# Patient Record
Sex: Female | Born: 1950
Health system: Southern US, Community
[De-identification: ages and names within clinical notes are randomized; demographics above are authoritative.]

## PROBLEM LIST (undated history)

## (undated) DIAGNOSIS — G43909 Migraine, unspecified, not intractable, without status migrainosus: Secondary | ICD-10-CM

## (undated) DIAGNOSIS — B019 Varicella without complication: Secondary | ICD-10-CM

## (undated) DIAGNOSIS — I480 Paroxysmal atrial fibrillation: Secondary | ICD-10-CM

## (undated) DIAGNOSIS — Z973 Presence of spectacles and contact lenses: Secondary | ICD-10-CM

## (undated) DIAGNOSIS — H3530 Unspecified macular degeneration: Secondary | ICD-10-CM

## (undated) DIAGNOSIS — B029 Zoster without complications: Secondary | ICD-10-CM

## (undated) DIAGNOSIS — I4891 Unspecified atrial fibrillation: Secondary | ICD-10-CM

## (undated) DIAGNOSIS — F418 Other specified anxiety disorders: Secondary | ICD-10-CM

## (undated) DIAGNOSIS — K439 Ventral hernia without obstruction or gangrene: Secondary | ICD-10-CM

## (undated) DIAGNOSIS — E785 Hyperlipidemia, unspecified: Secondary | ICD-10-CM

## (undated) DIAGNOSIS — M1990 Unspecified osteoarthritis, unspecified site: Secondary | ICD-10-CM

## (undated) DIAGNOSIS — F32A Depression, unspecified: Secondary | ICD-10-CM

## (undated) DIAGNOSIS — I82409 Acute embolism and thrombosis of unspecified deep veins of unspecified lower extremity: Secondary | ICD-10-CM

## (undated) DIAGNOSIS — R011 Cardiac murmur, unspecified: Secondary | ICD-10-CM

## (undated) DIAGNOSIS — I8390 Asymptomatic varicose veins of unspecified lower extremity: Secondary | ICD-10-CM

## (undated) DIAGNOSIS — B059 Measles without complication: Secondary | ICD-10-CM

## (undated) DIAGNOSIS — F329 Major depressive disorder, single episode, unspecified: Secondary | ICD-10-CM

## (undated) DIAGNOSIS — Z8632 Personal history of gestational diabetes: Secondary | ICD-10-CM

## (undated) DIAGNOSIS — M25461 Effusion, right knee: Secondary | ICD-10-CM

## (undated) DIAGNOSIS — K5792 Diverticulitis of intestine, part unspecified, without perforation or abscess without bleeding: Secondary | ICD-10-CM

## (undated) DIAGNOSIS — M4310 Spondylolisthesis, site unspecified: Secondary | ICD-10-CM

## (undated) DIAGNOSIS — E039 Hypothyroidism, unspecified: Secondary | ICD-10-CM

## (undated) DIAGNOSIS — B083 Erythema infectiosum [fifth disease]: Secondary | ICD-10-CM

## (undated) HISTORY — DX: Other specified anxiety disorders: F41.8

## (undated) HISTORY — DX: Zoster without complications: B02.9

## (undated) HISTORY — DX: Hyperlipidemia, unspecified: E78.5

## (undated) HISTORY — DX: Unspecified osteoarthritis, unspecified site: M19.90

## (undated) HISTORY — DX: Cardiac murmur, unspecified: R01.1

## (undated) HISTORY — DX: Unspecified atrial fibrillation: I48.91

## (undated) HISTORY — PX: MOUTH SURGERY: SHX715

## (undated) HISTORY — DX: Paroxysmal atrial fibrillation: I48.0

## (undated) HISTORY — DX: Personal history of gestational diabetes: Z86.32

## (undated) HISTORY — DX: Unspecified macular degeneration: H35.30

## (undated) HISTORY — PX: COLONOSCOPY: SHX174

## (undated) HISTORY — DX: Ventral hernia without obstruction or gangrene: K43.9

## (undated) HISTORY — PX: DILATION AND CURETTAGE OF UTERUS: SHX78

## (undated) HISTORY — DX: Varicella without complication: B01.9

## (undated) HISTORY — DX: Asymptomatic varicose veins of unspecified lower extremity: I83.90

## (undated) HISTORY — DX: Spondylolisthesis, site unspecified: M43.10

## (undated) HISTORY — DX: Measles without complication: B05.9

---

## 1987-04-09 DIAGNOSIS — B083 Erythema infectiosum [fifth disease]: Secondary | ICD-10-CM

## 1987-04-09 HISTORY — DX: Erythema infectiosum (fifth disease): B08.3

## 1993-04-08 HISTORY — PX: VARICOSE VEIN SURGERY: SHX832

## 2006-04-08 DIAGNOSIS — I4891 Unspecified atrial fibrillation: Secondary | ICD-10-CM

## 2006-04-08 HISTORY — DX: Unspecified atrial fibrillation: I48.91

## 2008-04-08 LAB — HM COLONOSCOPY

## 2010-04-08 DIAGNOSIS — M4310 Spondylolisthesis, site unspecified: Secondary | ICD-10-CM

## 2010-04-08 HISTORY — DX: Spondylolisthesis, site unspecified: M43.10

## 2011-04-09 LAB — HM PAP SMEAR

## 2012-08-04 ENCOUNTER — Ambulatory Visit (INDEPENDENT_AMBULATORY_CARE_PROVIDER_SITE_OTHER): Payer: 59 | Admitting: Family Medicine

## 2012-08-04 ENCOUNTER — Encounter: Payer: Self-pay | Admitting: Family Medicine

## 2012-08-04 VITALS — BP 118/72 | HR 49 | Temp 97.9°F | Ht 65.0 in

## 2012-08-04 DIAGNOSIS — B019 Varicella without complication: Secondary | ICD-10-CM | POA: Insufficient documentation

## 2012-08-04 DIAGNOSIS — I4891 Unspecified atrial fibrillation: Secondary | ICD-10-CM

## 2012-08-04 DIAGNOSIS — R011 Cardiac murmur, unspecified: Secondary | ICD-10-CM | POA: Insufficient documentation

## 2012-08-04 DIAGNOSIS — O24410 Gestational diabetes mellitus in pregnancy, diet controlled: Secondary | ICD-10-CM

## 2012-08-04 DIAGNOSIS — B029 Zoster without complications: Secondary | ICD-10-CM | POA: Insufficient documentation

## 2012-08-04 DIAGNOSIS — M1990 Unspecified osteoarthritis, unspecified site: Secondary | ICD-10-CM

## 2012-08-04 DIAGNOSIS — M4310 Spondylolisthesis, site unspecified: Secondary | ICD-10-CM | POA: Insufficient documentation

## 2012-08-04 DIAGNOSIS — Z0000 Encounter for general adult medical examination without abnormal findings: Secondary | ICD-10-CM

## 2012-08-04 DIAGNOSIS — K439 Ventral hernia without obstruction or gangrene: Secondary | ICD-10-CM

## 2012-08-04 DIAGNOSIS — K5900 Constipation, unspecified: Secondary | ICD-10-CM

## 2012-08-04 DIAGNOSIS — E785 Hyperlipidemia, unspecified: Secondary | ICD-10-CM

## 2012-08-04 DIAGNOSIS — I480 Paroxysmal atrial fibrillation: Secondary | ICD-10-CM

## 2012-08-04 DIAGNOSIS — I8390 Asymptomatic varicose veins of unspecified lower extremity: Secondary | ICD-10-CM

## 2012-08-04 HISTORY — DX: Ventral hernia without obstruction or gangrene: K43.9

## 2012-08-04 LAB — LIPID PANEL
Cholesterol: 206 mg/dL — ABNORMAL HIGH (ref 0–200)
HDL: 67 mg/dL (ref >39)
LDL Cholesterol: 128 mg/dL — ABNORMAL HIGH (ref 0–99)
Triglycerides: 54 mg/dL (ref <150)

## 2012-08-04 LAB — HEPATIC FUNCTION PANEL
AST: 27 U/L (ref 0–37)
Bilirubin, Direct: 0.2 mg/dL (ref 0.0–0.3)
Indirect Bilirubin: 0.8 mg/dL (ref 0.0–0.9)
Total Bilirubin: 1 mg/dL (ref 0.3–1.2)

## 2012-08-04 LAB — CBC
Hemoglobin: 13.2 g/dL (ref 12.0–15.0)
MCHC: 35.3 g/dL (ref 30.0–36.0)
RDW: 13.3 % (ref 11.5–15.5)
WBC: 5.6 10*3/uL (ref 4.0–10.5)

## 2012-08-04 LAB — RENAL FUNCTION PANEL
BUN: 13 mg/dL (ref 6–23)
CO2: 30 mEq/L (ref 19–32)
Glucose, Bld: 85 mg/dL (ref 70–99)
Potassium: 4.4 mEq/L (ref 3.5–5.3)
Sodium: 139 mEq/L (ref 135–145)

## 2012-08-04 NOTE — Patient Instructions (Addendum)
Rel of rec PMD, keneth cohen or down load off of disc   Preventive Care for Adults, Female A healthy lifestyle and preventive care can promote health and wellness. Preventive health guidelines for women include the following key practices.  A routine yearly physical is a good way to check with your caregiver about your health and preventive screening. It is a chance to share any concerns and updates on your health, and to receive a thorough exam.  Visit your dentist for a routine exam and preventive care every 6 months. Brush your teeth twice a day and floss once a day. Good oral hygiene prevents tooth decay and gum disease.  The frequency of eye exams is based on your age, health, family medical history, use of contact lenses, and other factors. Follow your caregiver's recommendations for frequency of eye exams.  Eat a healthy diet. Foods like vegetables, fruits, whole grains, low-fat dairy products, and lean protein foods contain the nutrients you need without too many calories. Decrease your intake of foods high in solid fats, added sugars, and salt. Eat the right amount of calories for you.Get information about a proper diet from your caregiver, if necessary.  Regular physical exercise is one of the most important things you can do for your health. Most adults should get at least 150 minutes of moderate-intensity exercise (any activity that increases your heart rate and causes you to sweat) each week. In addition, most adults need muscle-strengthening exercises on 2 or more days a week.  Maintain a healthy weight. The body mass index (BMI) is a screening tool to identify possible weight problems. It provides an estimate of body fat based on height and weight. Your caregiver can help determine your BMI, and can help you achieve or maintain a healthy weight.For adults 20 years and older:  A BMI below 18.5 is considered underweight.  A BMI of 18.5 to 24.9 is normal.  A BMI of 25 to 29.9 is  considered overweight.  A BMI of 30 and above is considered obese.  Maintain normal blood lipids and cholesterol levels by exercising and minimizing your intake of saturated fat. Eat a balanced diet with plenty of fruit and vegetables. Blood tests for lipids and cholesterol should begin at age 39 and be repeated every 5 years. If your lipid or cholesterol levels are high, you are over 50, or you are at high risk for heart disease, you may need your cholesterol levels checked more frequently.Ongoing high lipid and cholesterol levels should be treated with medicines if diet and exercise are not effective.  If you smoke, find out from your caregiver how to quit. If you do not use tobacco, do not start.  If you are pregnant, do not drink alcohol. If you are breastfeeding, be very cautious about drinking alcohol. If you are not pregnant and choose to drink alcohol, do not exceed 1 drink per day. One drink is considered to be 12 ounces (355 mL) of beer, 5 ounces (148 mL) of wine, or 1.5 ounces (44 mL) of liquor.  Avoid use of street drugs. Do not share needles with anyone. Ask for help if you need support or instructions about stopping the use of drugs.  High blood pressure causes heart disease and increases the risk of stroke. Your blood pressure should be checked at least every 1 to 2 years. Ongoing high blood pressure should be treated with medicines if weight loss and exercise are not effective.  If you are 55 to 62 years  old, ask your caregiver if you should take aspirin to prevent strokes.  Diabetes screening involves taking a blood sample to check your fasting blood sugar level. This should be done once every 3 years, after age 55, if you are within normal weight and without risk factors for diabetes. Testing should be considered at a younger age or be carried out more frequently if you are overweight and have at least 1 risk factor for diabetes.  Breast cancer screening is essential preventive  care for women. You should practice "breast self-awareness." This means understanding the normal appearance and feel of your breasts and may include breast self-examination. Any changes detected, no matter how small, should be reported to a caregiver. Women in their 2s and 30s should have a clinical breast exam (CBE) by a caregiver as part of a regular health exam every 1 to 3 years. After age 60, women should have a CBE every year. Starting at age 82, women should consider having a mammography (breast X-ray test) every year. Women who have a family history of breast cancer should talk to their caregiver about genetic screening. Women at a high risk of breast cancer should talk to their caregivers about having magnetic resonance imaging (MRI) and a mammography every year.  The Pap test is a screening test for cervical cancer. A Pap test can show cell changes on the cervix that might become cervical cancer if left untreated. A Pap test is a procedure in which cells are obtained and examined from the lower end of the uterus (cervix).  Women should have a Pap test starting at age 86.  Between ages 49 and 6, Pap tests should be repeated every 2 years.  Beginning at age 80, you should have a Pap test every 3 years as long as the past 3 Pap tests have been normal.  Some women have medical problems that increase the chance of getting cervical cancer. Talk to your caregiver about these problems. It is especially important to talk to your caregiver if a new problem develops soon after your last Pap test. In these cases, your caregiver may recommend more frequent screening and Pap tests.  The above recommendations are the same for women who have or have not gotten the vaccine for human papillomavirus (HPV).  If you had a hysterectomy for a problem that was not cancer or a condition that could lead to cancer, then you no longer need Pap tests. Even if you no longer need a Pap test, a regular exam is a good idea  to make sure no other problems are starting.  If you are between ages 74 and 38, and you have had normal Pap tests going back 10 years, you no longer need Pap tests. Even if you no longer need a Pap test, a regular exam is a good idea to make sure no other problems are starting.  If you have had past treatment for cervical cancer or a condition that could lead to cancer, you need Pap tests and screening for cancer for at least 20 years after your treatment.  If Pap tests have been discontinued, risk factors (such as a new sexual partner) need to be reassessed to determine if screening should be resumed.  The HPV test is an additional test that may be used for cervical cancer screening. The HPV test looks for the virus that can cause the cell changes on the cervix. The cells collected during the Pap test can be tested for HPV. The HPV  test could be used to screen women aged 74 years and older, and should be used in women of any age who have unclear Pap test results. After the age of 50, women should have HPV testing at the same frequency as a Pap test.  Colorectal cancer can be detected and often prevented. Most routine colorectal cancer screening begins at the age of 49 and continues through age 39. However, your caregiver may recommend screening at an earlier age if you have risk factors for colon cancer. On a yearly basis, your caregiver may provide home test kits to check for hidden blood in the stool. Use of a small camera at the end of a tube, to directly examine the colon (sigmoidoscopy or colonoscopy), can detect the earliest forms of colorectal cancer. Talk to your caregiver about this at age 72, when routine screening begins. Direct examination of the colon should be repeated every 5 to 10 years through age 44, unless early forms of pre-cancerous polyps or small growths are found.  Hepatitis C blood testing is recommended for all people born from [birth year] through 1965 and any individual with known  risks for hepatitis C.  Practice safe sex. Use condoms and avoid high-risk sexual practices to reduce the spread of sexually transmitted infections (STIs). STIs include gonorrhea, chlamydia, syphilis, trichomonas, herpes, HPV, and human immunodeficiency virus (HIV). Herpes, HIV, and HPV are viral illnesses that have no cure. They can result in disability, cancer, and death. Sexually active women aged 38 and younger should be checked for chlamydia. Older women with new or multiple partners should also be tested for chlamydia. Testing for other STIs is recommended if you are sexually active and at increased risk.  Osteoporosis is a disease in which the bones lose minerals and strength with aging. This can result in serious bone fractures. The risk of osteoporosis can be identified using a bone density scan. Women ages 73 and over and women at risk for fractures or osteoporosis should discuss screening with their caregivers. Ask your caregiver whether you should take a calcium supplement or vitamin D to reduce the rate of osteoporosis.  Menopause can be associated with physical symptoms and risks. Hormone replacement therapy is available to decrease symptoms and risks. You should talk to your caregiver about whether hormone replacement therapy is right for you.  Use sunscreen with sun protection factor (SPF) of 30 or more. Apply sunscreen liberally and repeatedly throughout the day. You should seek shade when your shadow is shorter than you. Protect yourself by wearing long sleeves, pants, a wide-brimmed hat, and sunglasses year round, whenever you are outdoors.  Once a month, do a whole body skin exam, using a mirror to look at the skin on your back. Notify your caregiver of new moles, moles that have irregular borders, moles that are larger than a pencil eraser, or moles that have changed in shape or color.  Stay current with required immunizations.  Influenza. You need a dose every fall (or winter).  The composition of the flu vaccine changes each year, so being vaccinated once is not enough.  Pneumococcal polysaccharide. You need 1 to 2 doses if you smoke cigarettes or if you have certain chronic medical conditions. You need 1 dose at age 23 (or older) if you have never been vaccinated.  Tetanus, diphtheria, pertussis (Tdap, Td). Get 1 dose of Tdap vaccine if you are younger than age 64, are over 49 and have contact with an infant, are a Research scientist (physical sciences), are pregnant, or  simply want to be protected from whooping cough. After that, you need a Td booster dose every 10 years. Consult your caregiver if you have not had at least 3 tetanus and diphtheria-containing shots sometime in your life or have a deep or dirty wound.  HPV. You need this vaccine if you are a woman age 10 or younger. The vaccine is given in 3 doses over 6 months.  Measles, mumps, rubella (MMR). You need at least 1 dose of MMR if you were born in 1957 or later. You may also need a second dose.  Meningococcal. If you are age 70 to 50 and a first-year college student living in a residence hall, or have one of several medical conditions, you need to get vaccinated against meningococcal disease. You may also need additional booster doses.  Zoster (shingles). If you are age 43 or older, you should get this vaccine.  Varicella (chickenpox). If you have never had chickenpox or you were vaccinated but received only 1 dose, talk to your caregiver to find out if you need this vaccine.  Hepatitis A. You need this vaccine if you have a specific risk factor for hepatitis A virus infection or you simply wish to be protected from this disease. The vaccine is usually given as 2 doses, 6 to 18 months apart.  Hepatitis B. You need this vaccine if you have a specific risk factor for hepatitis B virus infection or you simply wish to be protected from this disease. The vaccine is given in 3 doses, usually over 6 months. Preventive Services /  Frequency Ages 78 to 18  Blood pressure check.** / Every 1 to 2 years.  Lipid and cholesterol check.** / Every 5 years beginning at age 51.  Clinical breast exam.** / Every 3 years for women in their 64s and 30s.  Pap test.** / Every 2 years from ages 49 through 23. Every 3 years starting at age 20 through age 73 or 67 with a history of 3 consecutive normal Pap tests.  HPV screening.** / Every 3 years from ages 73 through ages 22 to 96 with a history of 3 consecutive normal Pap tests.  Hepatitis C blood test.** / For any individual with known risks for hepatitis C.  Skin self-exam. / Monthly.  Influenza immunization.** / Every year.  Pneumococcal polysaccharide immunization.** / 1 to 2 doses if you smoke cigarettes or if you have certain chronic medical conditions.  Tetanus, diphtheria, pertussis (Tdap, Td) immunization. / A one-time dose of Tdap vaccine. After that, you need a Td booster dose every 10 years.  HPV immunization. / 3 doses over 6 months, if you are 71 and younger.  Measles, mumps, rubella (MMR) immunization. / You need at least 1 dose of MMR if you were born in 1957 or later. You may also need a second dose.  Meningococcal immunization. / 1 dose if you are age 1 to 34 and a first-year college student living in a residence hall, or have one of several medical conditions, you need to get vaccinated against meningococcal disease. You may also need additional booster doses.  Varicella immunization.** / Consult your caregiver.  Hepatitis A immunization.** / Consult your caregiver. 2 doses, 6 to 18 months apart.  Hepatitis B immunization.** / Consult your caregiver. 3 doses usually over 6 months. Ages 40 to 69  Blood pressure check.** / Every 1 to 2 years.  Lipid and cholesterol check.** / Every 5 years beginning at age 65.  Clinical breast exam.** /  Every year after age 79.  Mammogram.** / Every year beginning at age 74 and continuing for as long as you are in  good health. Consult with your caregiver.  Pap test.** / Every 3 years starting at age 61 through age 9 or 37 with a history of 3 consecutive normal Pap tests.  HPV screening.** / Every 3 years from ages 55 through ages 68 to 33 with a history of 3 consecutive normal Pap tests.  Fecal occult blood test (FOBT) of stool. / Every year beginning at age 41 and continuing until age 37. You may not need to do this test if you get a colonoscopy every 10 years.  Flexible sigmoidoscopy or colonoscopy.** / Every 5 years for a flexible sigmoidoscopy or every 10 years for a colonoscopy beginning at age 39 and continuing until age 35.  Hepatitis C blood test.** / For all people born from [birth year] through 1965 and any individual with known risks for hepatitis C.  Skin self-exam. / Monthly.  Influenza immunization.** / Every year.  Pneumococcal polysaccharide immunization.** / 1 to 2 doses if you smoke cigarettes or if you have certain chronic medical conditions.  Tetanus, diphtheria, pertussis (Tdap, Td) immunization.** / A one-time dose of Tdap vaccine. After that, you need a Td booster dose every 10 years.  Measles, mumps, rubella (MMR) immunization. / You need at least 1 dose of MMR if you were born in 1957 or later. You may also need a second dose.  Varicella immunization.** / Consult your caregiver.  Meningococcal immunization.** / Consult your caregiver.  Hepatitis A immunization.** / Consult your caregiver. 2 doses, 6 to 18 months apart.  Hepatitis B immunization.** / Consult your caregiver. 3 doses, usually over 6 months. Ages 63 and over  Blood pressure check.** / Every 1 to 2 years.  Lipid and cholesterol check.** / Every 5 years beginning at age 12.  Clinical breast exam.** / Every year after age 4.  Mammogram.** / Every year beginning at age 18 and continuing for as long as you are in good health. Consult with your caregiver.  Pap test.** / Every 3 years starting at age 73 through  age 70 or 49 with a 3 consecutive normal Pap tests. Testing can be stopped between 65 and 70 with 3 consecutive normal Pap tests and no abnormal Pap or HPV tests in the past 10 years.  HPV screening.** / Every 3 years from ages 35 through ages 55 or 38 with a history of 3 consecutive normal Pap tests. Testing can be stopped between 65 and 70 with 3 consecutive normal Pap tests and no abnormal Pap or HPV tests in the past 10 years.  Fecal occult blood test (FOBT) of stool. / Every year beginning at age 50 and continuing until age 48. You may not need to do this test if you get a colonoscopy every 10 years.  Flexible sigmoidoscopy or colonoscopy.** / Every 5 years for a flexible sigmoidoscopy or every 10 years for a colonoscopy beginning at age 78 and continuing until age 34.  Hepatitis C blood test.** / For all people born from [birth year] through 1965 and any individual with known risks for hepatitis C.  Osteoporosis screening.** / A one-time screening for women ages 29 and over and women at risk for fractures or osteoporosis.  Skin self-exam. / Monthly.  Influenza immunization.** / Every year.  Pneumococcal polysaccharide immunization.** / 1 dose at age 28 (or older) if you have never been vaccinated.  Tetanus, diphtheria, pertussis (  Tdap, Td) immunization. / A one-time dose of Tdap vaccine if you are over 65 and have contact with an infant, are a Research scientist (physical sciences), or simply want to be protected from whooping cough. After that, you need a Td booster dose every 10 years.  Varicella immunization.** / Consult your caregiver.  Meningococcal immunization.** / Consult your caregiver.  Hepatitis A immunization.** / Consult your caregiver. 2 doses, 6 to 18 months apart.  Hepatitis B immunization.** / Check with your caregiver. 3 doses, usually over 6 months. ** Family history and personal history of risk and conditions may change your caregiver's recommendations. Document Released: 05/21/2001 Document  Revised: 06/17/2011 Document Reviewed: 08/20/2010 [facility] Patient Information 2013 [place], [place].

## 2012-08-05 ENCOUNTER — Encounter: Payer: Self-pay | Admitting: Family Medicine

## 2012-08-05 ENCOUNTER — Encounter: Payer: Self-pay | Admitting: *Deleted

## 2012-08-05 DIAGNOSIS — K5900 Constipation, unspecified: Secondary | ICD-10-CM | POA: Insufficient documentation

## 2012-08-05 DIAGNOSIS — Z8632 Personal history of gestational diabetes: Secondary | ICD-10-CM

## 2012-08-05 DIAGNOSIS — I8390 Asymptomatic varicose veins of unspecified lower extremity: Secondary | ICD-10-CM | POA: Insufficient documentation

## 2012-08-05 DIAGNOSIS — K439 Ventral hernia without obstruction or gangrene: Secondary | ICD-10-CM | POA: Insufficient documentation

## 2012-08-05 DIAGNOSIS — M1990 Unspecified osteoarthritis, unspecified site: Secondary | ICD-10-CM

## 2012-08-05 DIAGNOSIS — E782 Mixed hyperlipidemia: Secondary | ICD-10-CM | POA: Insufficient documentation

## 2012-08-05 DIAGNOSIS — E785 Hyperlipidemia, unspecified: Secondary | ICD-10-CM

## 2012-08-05 HISTORY — DX: Asymptomatic varicose veins of unspecified lower extremity: I83.90

## 2012-08-05 HISTORY — DX: Hyperlipidemia, unspecified: E78.5

## 2012-08-05 HISTORY — DX: Unspecified osteoarthritis, unspecified site: M19.90

## 2012-08-05 HISTORY — DX: Ventral hernia without obstruction or gangrene: K43.9

## 2012-08-05 HISTORY — DX: Personal history of gestational diabetes: Z86.32

## 2012-08-05 NOTE — Assessment & Plan Note (Signed)
Symptomatic and episodes of Superficial Thrombophlebitis at times. Is considering having them dealt with surgically. Try compression hose and moist heat

## 2012-08-05 NOTE — Assessment & Plan Note (Signed)
Only one episode in 2010, stabilized with meds and no recurrence

## 2012-08-05 NOTE — Assessment & Plan Note (Signed)
No c/o back pain today

## 2012-08-05 NOTE — Assessment & Plan Note (Signed)
Encouraged adequate hydration, add a fiber supplement and probiotic, consider Senna S if no response

## 2012-08-05 NOTE — Progress Notes (Signed)
Patient ID: Crystal Russell, female   DOB: 12-03-1950, 62 y.o.   MRN: [id number] Crystal Russell [id number] [birth year] 08/05/2012      Progress Note New Patient  Subjective  Chief Complaint  Chief Complaint  Patient presents with  . Establish Care    new patient    HPI  Patient is a 62 year old Caucasian female who is in today to establish care she's recently moved to the area and is in need of primary care. She has had an episode of A. fib in the past back in 2010 but after medications this has not recurred. She has some valvular heart disease but it is asymptomatic. She takes Lomotil for her cholesterol and that is well tolerated. She has symptomatic varicose veins in bilateral lower extremities. Become painful and swollen at times. Struggles with constipation only moving her bowels once a week. This is ongoing for her and no bloody or tarry stools noted. No chest pain no bowel shortness of breath no GI or GU complaints otherwise noted today.  Past Medical History  Diagnosis Date  . Chicken pox as a child  . Shingles 43  . Measles as a child  . Hyperlipidemia   . Atrial fibrillation 2010  . Heart murmur   . Spondylisthesis 2012    L5/S1 grade 3  . Abdominal wall hernia 08/04/2012  . Ventral hernia 08/05/2012  . Varicose veins 08/05/2012    Extending up to right groin B/l LE  . DJD (degenerative joint disease) 08/05/2012  . Paroxysmal a-fib   . Other and unspecified hyperlipidemia 08/05/2012    Past Surgical History  Procedure Laterality Date  . Varicose vein surgery  1995    right leg    Family History  Problem Relation Age of Onset  . Cancer Mother     lung- smoker  . Liver disease Mother   . Cancer Father     lung- smoker  . Peripheral Artery Disease Sister   . Dementia Maternal Grandmother   . Heart attack Maternal Grandfather   . Asthma Son   . Ulcerative colitis Son   . Asthma Son   . Ulcerative colitis Son   . Heart attack Paternal Uncle     History   Social  History  . Marital Status: Married    Spouse Name: N/A    Number of Children: N/A  . Years of Education: N/A   Occupational History  . Not on file.   Social History Main Topics  . Smoking status: Never Smoker   . Smokeless tobacco: Never Used  . Alcohol Use: No  . Drug Use: No  . Sexually Active: Not on file   Other Topics Concern  . Not on file   Social History Narrative  . No narrative on file    No current outpatient prescriptions on file prior to visit.   No current facility-administered medications on file prior to visit.    Allergies  Allergen Reactions  . Penicillins     Hands and feet swell, rash    Review of Systems  Review of Systems  Constitutional: Negative for fever and malaise/fatigue.  HENT: Negative for congestion.   Eyes: Negative for discharge.  Respiratory: Negative for shortness of breath.   Cardiovascular: Negative for chest pain, palpitations and leg swelling.  Gastrointestinal: Negative for nausea, abdominal pain and diarrhea.  Genitourinary: Negative for dysuria.  Musculoskeletal: Negative for falls.  Skin: Negative for rash.  Neurological: Negative for loss of consciousness and headaches.  Endo/Heme/Allergies: Negative  for polydipsia.  Psychiatric/Behavioral: Negative for depression and suicidal ideas. The patient is not nervous/anxious and does not have insomnia.     Objective  BP 118/72  Pulse 49  Temp(Src) 97.9 F (36.6 C) (Oral)  Ht 5\' 5"  (1.651 m)  SpO2 98%  Physical Exam  Physical Exam  Constitutional: She is oriented to person, place, and time and well-developed, well-nourished, and in no distress. No distress.  HENT:  Head: Normocephalic and atraumatic.  Right Ear: External ear normal.  Left Ear: External ear normal.  Nose: Nose normal.  Mouth/Throat: Oropharynx is clear and moist. No oropharyngeal exudate.  Eyes: Conjunctivae are normal. Pupils are equal, round, and reactive to light. Right eye exhibits no  discharge. Left eye exhibits no discharge. No scleral icterus.  Neck: Normal range of motion. Neck supple. No thyromegaly present.  Cardiovascular: Normal rate, regular rhythm, normal heart sounds and intact distal pulses.   No murmur heard. Pulmonary/Chest: Effort normal and breath sounds normal. No respiratory distress. She has no wheezes. She has no rales.  Abdominal: Soft. Bowel sounds are normal. She exhibits no distension and no mass. There is no tenderness.  Midline ventral hernia, soft, NT  Musculoskeletal: Normal range of motion. She exhibits no edema and no tenderness.  Lymphadenopathy:    She has no cervical adenopathy.  Neurological: She is alert and oriented to person, place, and time. She has normal reflexes. No cranial nerve deficit. Coordination normal.  Skin: Skin is warm and dry. No rash noted. She is not diaphoretic.  Psychiatric: Mood, memory and affect normal.       Assessment & Plan  Heart murmur asymptomatic  DJD (degenerative joint disease) No c/o back pain today  Ventral hernia Asymptomatic, patient has ben offered surgery in past so far chooses not to proceed  Unspecified constipation Encouraged adequate hydration, add a fiber supplement and probiotic, consider Senna S if no response  Varicose veins Symptomatic and episodes of Superficial Thrombophlebitis at times. Is considering having them dealt with surgically. Try compression hose and moist heat  Paroxysmal a-fib Only one episode in 2010, stabilized with meds and no recurrence  Other and unspecified hyperlipidemia Mild, avoid trans fats, consider Krill oil caps, continue Livalo

## 2012-08-05 NOTE — Assessment & Plan Note (Signed)
Asymptomatic, patient has ben offered surgery in past so far chooses not to proceed

## 2012-08-05 NOTE — Assessment & Plan Note (Addendum)
Mild, avoid trans fats, consider Krill oil caps, continue Livalo

## 2012-08-05 NOTE — Assessment & Plan Note (Signed)
asymptomatic

## 2012-08-07 ENCOUNTER — Telehealth: Payer: Self-pay

## 2012-08-07 NOTE — Telephone Encounter (Signed)
Patient left a message stating she would like to know the results of her labs.  I called pt back and reached her vm. I left a detailed message and stated that a copy was mailed to her house on 08-05-12

## 2012-08-13 ENCOUNTER — Telehealth: Payer: Self-pay | Admitting: Family Medicine

## 2012-08-13 NOTE — Telephone Encounter (Signed)
Received medical records from [address] [facility]

## 2012-09-02 ENCOUNTER — Telehealth: Payer: Self-pay | Admitting: Family Medicine

## 2012-09-02 NOTE — Telephone Encounter (Signed)
Please advise? It looks like it was charged as a 45 minute New patient appt?

## 2012-09-02 NOTE — Telephone Encounter (Signed)
I left a message for Molli Hazard to call me back to see?

## 2012-09-02 NOTE — Telephone Encounter (Signed)
I am not sure what to tell her I always spend great deal of time with new patients, just interrogating her note confirms she had a complicated history I needed to evaluate. Maybe there is a wayt o interrogate how much time I spent in her chart in the computer?

## 2012-09-02 NOTE — Telephone Encounter (Signed)
Caller: Charlynn Court; Phone: [phone]; Reason for Call: Caller states she had a 10 min visit as a New patient and the EOB states pt was charged for a 45 min OV.   Please f/u with caller.

## 2012-09-02 NOTE — Telephone Encounter (Signed)
Matthew from IT called back and stated that unfortunately there is no way to tell how long since the charts stay open even when MD goes into another room

## 2012-09-03 NOTE — Telephone Encounter (Signed)
Pt was informed and voiced understanding.

## 2012-09-03 NOTE — Telephone Encounter (Signed)
OK well her note is clearly a level four encounter, it has mostly to do with the complexity of the history and care the 45 minutes is just an estimate that the billing system pulss up. I just billed a level 4 visit because of all hte problems we discussed

## 2012-09-07 ENCOUNTER — Other Ambulatory Visit: Payer: Self-pay

## 2012-09-07 MED ORDER — PITAVASTATIN CALCIUM 2 MG PO TABS: 1.0000 | ORAL_TABLET | Freq: Every day | ORAL | Status: DC

## 2012-09-09 ENCOUNTER — Other Ambulatory Visit: Payer: Self-pay

## 2012-09-09 ENCOUNTER — Other Ambulatory Visit: Payer: Self-pay | Admitting: Family Medicine

## 2012-09-09 MED ORDER — PITAVASTATIN CALCIUM 2 MG PO TABS: 1.0000 | ORAL_TABLET | Freq: Every day | ORAL | Status: DC

## 2012-09-09 NOTE — Telephone Encounter (Signed)
Pt left a message stating that she needs a 90 day supply of Livalo sent to pharmacy. RX sent

## 2012-10-15 ENCOUNTER — Encounter: Payer: Self-pay | Admitting: Family Medicine

## 2012-10-15 MED ORDER — LEVOTHYROXINE SODIUM 88 MCG PO TABS: 88.0000 ug | ORAL_TABLET | Freq: Every day | ORAL | Status: DC

## 2012-10-15 NOTE — Telephone Encounter (Signed)
Rx request to pharmacy/SLS  

## 2012-10-19 ENCOUNTER — Other Ambulatory Visit: Payer: Self-pay

## 2012-10-19 ENCOUNTER — Encounter: Payer: Self-pay | Admitting: Family Medicine

## 2012-10-19 ENCOUNTER — Other Ambulatory Visit (INDEPENDENT_AMBULATORY_CARE_PROVIDER_SITE_OTHER): Payer: Self-pay

## 2012-10-19 ENCOUNTER — Other Ambulatory Visit: Payer: Self-pay | Admitting: Family Medicine

## 2012-10-19 MED ORDER — LEVOTHYROXINE SODIUM 88 MCG PO TABS: 88.0000 ug | ORAL_TABLET | Freq: Every day | ORAL | Status: DC

## 2012-10-20 NOTE — Telephone Encounter (Signed)
Please advise 

## 2012-11-05 ENCOUNTER — Ambulatory Visit: Payer: 59 | Admitting: Family Medicine

## 2012-12-17 ENCOUNTER — Encounter: Payer: Self-pay | Admitting: Family Medicine

## 2012-12-17 ENCOUNTER — Other Ambulatory Visit: Payer: Self-pay | Admitting: Family Medicine

## 2012-12-17 MED ORDER — PITAVASTATIN CALCIUM 2 MG PO TABS: 1.0000 | ORAL_TABLET | Freq: Every day | ORAL | Status: DC

## 2012-12-17 NOTE — Telephone Encounter (Signed)
I will send in 30 day supply due to patient supposed to return in : Return in about 3 months (around 11/03/2012).  i will inform pt through [employer]

## 2012-12-21 MED ORDER — PITAVASTATIN CALCIUM 2 MG PO TABS: 1.0000 | ORAL_TABLET | Freq: Every day | ORAL | Status: DC

## 2012-12-21 NOTE — Telephone Encounter (Signed)
RX sent and mychart message sent to patient.  ?

## 2012-12-25 ENCOUNTER — Encounter: Payer: Self-pay | Admitting: Family Medicine

## 2012-12-25 ENCOUNTER — Ambulatory Visit (INDEPENDENT_AMBULATORY_CARE_PROVIDER_SITE_OTHER): Payer: 59 | Admitting: Family Medicine

## 2012-12-25 VITALS — BP 110/70 | HR 47 | Temp 98.3°F | Ht 65.0 in

## 2012-12-25 DIAGNOSIS — F411 Generalized anxiety disorder: Secondary | ICD-10-CM

## 2012-12-25 DIAGNOSIS — I480 Paroxysmal atrial fibrillation: Secondary | ICD-10-CM

## 2012-12-25 DIAGNOSIS — I4891 Unspecified atrial fibrillation: Secondary | ICD-10-CM

## 2012-12-25 DIAGNOSIS — Z23 Encounter for immunization: Secondary | ICD-10-CM

## 2012-12-25 DIAGNOSIS — K5900 Constipation, unspecified: Secondary | ICD-10-CM

## 2012-12-25 DIAGNOSIS — E785 Hyperlipidemia, unspecified: Secondary | ICD-10-CM

## 2012-12-25 DIAGNOSIS — R5381 Other malaise: Secondary | ICD-10-CM

## 2012-12-25 DIAGNOSIS — F418 Other specified anxiety disorders: Secondary | ICD-10-CM

## 2012-12-25 DIAGNOSIS — F341 Dysthymic disorder: Secondary | ICD-10-CM

## 2012-12-25 LAB — CBC
HCT: 36.2 % (ref 36.0–46.0)
Hemoglobin: 12.5 g/dL (ref 12.0–15.0)
MCHC: 34.5 g/dL (ref 30.0–36.0)
MCV: 85.6 fL (ref 78.0–100.0)
RDW: 13.5 % (ref 11.5–15.5)
WBC: 6.2 10*3/uL (ref 4.0–10.5)

## 2012-12-25 MED ORDER — PITAVASTATIN CALCIUM 2 MG PO TABS: 1.0000 | ORAL_TABLET | Freq: Every day | ORAL | Status: DC

## 2012-12-25 MED ORDER — LORAZEPAM 0.5 MG PO TABS: 0.5000 mg | ORAL_TABLET | Freq: Two times a day (BID) | ORAL | Status: DC | PRN

## 2012-12-25 NOTE — Patient Instructions (Addendum)

## 2012-12-26 LAB — LIPID PANEL
HDL: 61 mg/dL (ref >39)
LDL Cholesterol: 100 mg/dL — ABNORMAL HIGH (ref 0–99)
Total CHOL/HDL Ratio: 2.8 Ratio
VLDL: 9 mg/dL (ref 0–40)

## 2012-12-26 LAB — RENAL FUNCTION PANEL
BUN: 12 mg/dL (ref 6–23)
Chloride: 102 mEq/L (ref 96–112)
Creat: 0.73 mg/dL (ref 0.50–1.10)
Glucose, Bld: 84 mg/dL (ref 70–99)
Phosphorus: 3.5 mg/dL (ref 2.3–4.6)
Potassium: 4.4 mEq/L (ref 3.5–5.3)

## 2012-12-26 LAB — HEPATIC FUNCTION PANEL
Albumin: 4.2 g/dL (ref 3.5–5.2)
Bilirubin, Direct: 0.2 mg/dL (ref 0.0–0.3)
Total Bilirubin: 0.8 mg/dL (ref 0.3–1.2)

## 2012-12-26 LAB — T4, FREE: Free T4: 1.37 ng/dL (ref 0.80–1.80)

## 2012-12-27 ENCOUNTER — Encounter: Payer: Self-pay | Admitting: Family Medicine

## 2012-12-27 DIAGNOSIS — F418 Other specified anxiety disorders: Secondary | ICD-10-CM

## 2012-12-27 HISTORY — DX: Other specified anxiety disorders: F41.8

## 2012-12-27 NOTE — Assessment & Plan Note (Signed)
Encouraged probiotics, adequate fluids, fiber and stool softeners as needed

## 2012-12-27 NOTE — Assessment & Plan Note (Signed)
Well controlled on Livalo, avoid trans fats and simple carbs. monitor

## 2012-12-27 NOTE — Progress Notes (Signed)
Patient ID: Crystal Russell, female   DOB: June 22, 1950, 62 y.o.   MRN: [id number] Crystal Russell [id number] 1950-05-22 12/27/2012      Progress Note-Follow Up  Subjective  Chief Complaint  Chief Complaint  Patient presents with  . Follow-up    3 month  . Injections    flu    HPI  Patient is a 62 year old Caucasian female in today for followup. She is upset today over an incident with her grandchild at daycare. Her grandchild was misplaced for some time. She's also had a very sick 56 year old son. He's been in the hospital at [facility] annually died from a bad flare of Crohn's disease. Patient is very agitated. Has her ongoing complaints of fatigue and feeling cold. Has trouble with sleep feeling excessively tired but also having trouble with: A. sleep. No chest pain or palpitations. No shortness of breath GI or GU complaints otherwise noted.  Past Medical History  Diagnosis Date  . Chicken pox as a child  . Shingles 43  . Measles as a child  . Hyperlipidemia   . Atrial fibrillation 2010  . Heart murmur   . Spondylisthesis 2012    L5/S1 grade 3  . Abdominal wall hernia 08/04/2012  . Ventral hernia 08/05/2012  . Varicose veins 08/05/2012    Extending up to right groin B/l LE  . DJD (degenerative joint disease) 08/05/2012  . Paroxysmal a-fib   . Other and unspecified hyperlipidemia 08/05/2012  . Depression with anxiety 12/27/2012    Past Surgical History  Procedure Laterality Date  . Varicose vein surgery  1995    right leg    Family History  Problem Relation Age of Onset  . Cancer Mother     lung- smoker  . Liver disease Mother   . Cancer Father     lung- smoker  . Peripheral Artery Disease Sister   . Dementia Maternal Grandmother   . Heart attack Maternal Grandfather   . Asthma Son   . Ulcerative colitis Son   . Asthma Son   . Ulcerative colitis Son   . Heart attack Paternal Uncle     History   Social History  . Marital Status: Married    Spouse Name: N/A    Number of  Children: N/A  . Years of Education: N/A   Occupational History  . Not on file.   Social History Main Topics  . Smoking status: Never Smoker   . Smokeless tobacco: Never Used  . Alcohol Use: No  . Drug Use: No  . Sexual Activity: Not on file   Other Topics Concern  . Not on file   Social History Narrative  . No narrative on file    Current Outpatient Prescriptions on File Prior to Visit  Medication Sig Dispense Refill  . Ascorbic Acid (VITAMIN C) 100 MG tablet Take 100 mg by mouth daily.      Marland Kitchen aspirin 81 MG tablet Take 81 mg by mouth daily.      . Calcium 200 MG TABS Take 1 tablet by mouth daily.      . cholecalciferol (VITAMIN D) 400 UNITS TABS Take 400 Units by mouth daily.      . Cholecalciferol (VITAMIN D-3) 1000 UNITS CAPS Take 2,000 Units by mouth daily.      Marland Kitchen Co-Enzyme Q-10 100 MG CAPS Take 300 mg by mouth daily.      . Docosahexaenoic Acid (DHA) 200 MG CAPS Take 1 capsule by mouth daily.      Marland Kitchen  Fenoprofen Calcium (NALFON) 200 MG CAPS Take by mouth.      . Glucosamine-Chondroitin (GLUCOSAMINE CHONDR COMPLEX PO) Take by mouth.      . levothyroxine (SYNTHROID, LEVOTHROID) 88 MCG tablet Take 1 tablet (88 mcg total) by mouth daily before breakfast.  90 tablet  0  . MAGNESIUM CITRATE PO Take 75 mg by mouth daily.      . Multiple Vitamin (MULTIVITAMIN) tablet Take 1 tablet by mouth daily.      . Omega-3 Fatty Acids (EPA PO) Take 300 mg by mouth daily.      Marland Kitchen OVER THE COUNTER MEDICATION GLA 150 mg- 1 daily      . VITAMIN E COMPLEX PO Take 1 tablet by mouth daily.       No current facility-administered medications on file prior to visit.    Allergies  Allergen Reactions  . Penicillins     Hands and feet swell, rash    Review of Systems  Review of Systems  Constitutional: Positive for chills and malaise/fatigue. Negative for fever.  HENT: Negative for congestion.   Eyes: Negative for discharge.  Respiratory: Negative for shortness of breath.   Cardiovascular:  Negative for chest pain, palpitations and leg swelling.  Gastrointestinal: Negative for nausea, abdominal pain and diarrhea.  Genitourinary: Negative for dysuria.  Musculoskeletal: Negative for falls.  Skin: Negative for rash.  Neurological: Negative for loss of consciousness and headaches.  Endo/Heme/Allergies: Negative for polydipsia.  Psychiatric/Behavioral: Positive for depression. Negative for suicidal ideas. The patient is nervous/anxious and has insomnia.     Objective  BP 110/70  Pulse 47  Temp(Src) 98.3 F (36.8 C) (Oral)  Ht 5\' 5"  (1.651 m)  SpO2 97%  Physical Exam  Physical Exam  Constitutional: She is oriented to person, place, and time and well-developed, well-nourished, and in no distress. No distress.  HENT:  Head: Normocephalic and atraumatic.  Eyes: Conjunctivae are normal.  Neck: Neck supple. No thyromegaly present.  Cardiovascular: Normal rate, regular rhythm and normal heart sounds.   No murmur heard. Pulmonary/Chest: Effort normal and breath sounds normal. She has no wheezes.  Abdominal: She exhibits no distension and no mass.  Musculoskeletal: She exhibits no edema.  Lymphadenopathy:    She has no cervical adenopathy.  Neurological: She is alert and oriented to person, place, and time.  Skin: Skin is warm and dry. No rash noted. She is not diaphoretic.  Psychiatric: Memory, affect and judgment normal.    Lab Results  Component Value Date   TSH 2.337 12/25/2012   Lab Results  Component Value Date   WBC 6.2 12/25/2012   HGB 12.5 12/25/2012   HCT 36.2 12/25/2012   MCV 85.6 12/25/2012   PLT 248 12/25/2012   Lab Results  Component Value Date   CREATININE 0.73 12/25/2012   BUN 12 12/25/2012   NA 139 12/25/2012   K 4.4 12/25/2012   CL 102 12/25/2012   CO2 31 12/25/2012   Lab Results  Component Value Date   ALT 27 12/25/2012   AST 30 12/25/2012   ALKPHOS 66 12/25/2012   BILITOT 0.8 12/25/2012   Lab Results  Component Value Date   CHOL 170 12/25/2012    Lab Results  Component Value Date   HDL 61 12/25/2012   Lab Results  Component Value Date   LDLCALC 100* 12/25/2012   Lab Results  Component Value Date   TRIG 45 12/25/2012   Lab Results  Component Value Date   CHOLHDL 2.8 12/25/2012  Assessment & Plan  Paroxysmal a-fib Occurred long before moving to [facility] and has not occurred since ablation. She has not established with cardiology here yet, may need referral moving forward.  Other and unspecified hyperlipidemia Well controlled on Livalo, avoid trans fats and simple carbs. monitor  Unspecified constipation Encouraged probiotics, adequate fluids, fiber and stool softeners as needed  Depression with anxiety Patient tearful and agitated today. She has had significant stressors with her son being very ill with his Crohn's Disease and some trouble with  Her grand children as well. Declines daily med for now. Is allowed some Lorazepam prn. Use sparingly

## 2012-12-27 NOTE — Assessment & Plan Note (Signed)
Patient tearful and agitated today. She has had significant stressors with her son being very ill with his Crohn's Disease and some trouble with  Her grand children as well. Declines daily med for now. Is allowed some Lorazepam prn. Use sparingly

## 2012-12-27 NOTE — Assessment & Plan Note (Signed)
Occurred long before moving to [facility] and has not occurred since ablation. She has not established with cardiology here yet, may need referral moving forward.

## 2013-01-19 ENCOUNTER — Other Ambulatory Visit: Payer: Self-pay | Admitting: Family Medicine

## 2013-01-19 MED ORDER — LEVOTHYROXINE SODIUM 88 MCG PO TABS: 88.0000 ug | ORAL_TABLET | Freq: Every day | ORAL | Status: DC

## 2013-01-20 ENCOUNTER — Telehealth: Payer: Self-pay | Admitting: Family Medicine

## 2013-01-20 DIAGNOSIS — E039 Hypothyroidism, unspecified: Secondary | ICD-10-CM

## 2013-01-20 MED ORDER — LEVOTHYROXINE SODIUM 88 MCG PO TABS: 88.0000 ug | ORAL_TABLET | Freq: Every day | ORAL | Status: DC

## 2013-01-20 NOTE — Telephone Encounter (Signed)
Also received message from pt stating she is wanting to try generic synthroid and the pharmacy needs to get authorization from [place] to dispense generic. Rx re-sent. Notified pt. Per July documentation where pt first discussed changing to generic it was advised that pt repeat TSH 10 weeks after changing to generic. What diagnosis are using?

## 2013-01-20 NOTE — Telephone Encounter (Signed)
Hypothyroidism is the diagnosis to check her TSH

## 2013-01-20 NOTE — Telephone Encounter (Signed)
She wants to try levothyroxin in place of her synthroid.  [facility].  They will not fill it without permission from Dr Abner Greenspan for her to get the levothyroxin.  She wants to try this as it is much cheaper.  .  Please call her to advise that this has been done.

## 2013-01-21 NOTE — Telephone Encounter (Signed)
Orders placed.

## 2013-04-05 ENCOUNTER — Encounter: Payer: Self-pay | Admitting: Family Medicine

## 2013-04-16 ENCOUNTER — Other Ambulatory Visit: Payer: Self-pay

## 2013-04-16 DIAGNOSIS — Z1231 Encounter for screening mammogram for malignant neoplasm of breast: Secondary | ICD-10-CM

## 2013-04-26 ENCOUNTER — Other Ambulatory Visit: Payer: Self-pay | Admitting: Family Medicine

## 2013-04-27 MED ORDER — LEVOTHYROXINE SODIUM 88 MCG PO TABS: 88.0000 ug | ORAL_TABLET | Freq: Every day | ORAL | Status: DC

## 2013-05-07 ENCOUNTER — Ambulatory Visit
Admission: RE | Admit: 2013-05-07 | Discharge: 2013-05-07 | Disposition: A | Discharge status: Home / Self Care | Payer: Self-pay | Source: Ambulatory Visit

## 2013-05-07 ENCOUNTER — Encounter: Payer: Self-pay | Admitting: Family Medicine

## 2013-05-07 DIAGNOSIS — Z1231 Encounter for screening mammogram for malignant neoplasm of breast: Secondary | ICD-10-CM

## 2013-05-11 ENCOUNTER — Telehealth: Payer: Self-pay

## 2013-05-11 ENCOUNTER — Encounter: Payer: Self-pay | Admitting: Family Medicine

## 2013-05-11 NOTE — Telephone Encounter (Signed)
It is not that I do not want her to take Livalo or another statin insurance just will not let her have Livalo and I do not have her old meds and labs they never arrived or did not get scanned. I can just pick a new one but without names of there meds she has tried and failed I will not be able to get her insurance to cover the Livalo they will simply say no. We can try requesting records from old MD again, or we can start Lipitor 10 mg po qod and then increase to daily in a month if she does not have any trouble but I would prefer to not guess if we could get more records.Give her samples if we have them to hold her over

## 2013-05-11 NOTE — Telephone Encounter (Signed)
Here is the message about the Livalo that came through:  [facility]- [facility] called [facility] to [facility] my Livalo 2 mgs and they want prior authorization from you and a reason why you did not prescribe another cheaper statin. I had severe leg cramps on generic, but at the time I did not take coq10. If you feel I should try it again I will. But I have about 2 days left of my Livalo now. Thank you

## 2013-05-11 NOTE — Telephone Encounter (Signed)
Patient left a message stating that she is out of her Chanda Busing and her old dr was supposed to send over paperwork with what she used to take and she needs Dr Charlett Blake to decide what she is taking. Livalo does need a PA also  Please advise?

## 2013-05-12 NOTE — Telephone Encounter (Signed)
This was sent to pt in mychart message and samples left up front for patient

## 2013-05-13 ENCOUNTER — Telehealth: Payer: Self-pay | Admitting: Family Medicine

## 2013-05-13 NOTE — Telephone Encounter (Signed)
Patient can't remember what statin she has taken in the past. Pt states her old MD's office keeps saying that they are sending her notes but we don't have them.   Pt would just like to try another statin instead of doing the PA now. Pt is taking CoQ10 now and thinks that will help her leg cramps.  Please advise which statin to send to pharmacy. Pt has 3 weeks of samples she states. Pt is aware that MD will not be in the office until 05-20-13

## 2013-05-13 NOTE — Telephone Encounter (Signed)
PA form faxed over for Livalo, form forward to nurse

## 2013-05-18 ENCOUNTER — Encounter: Payer: Self-pay | Admitting: Family Medicine

## 2013-05-18 NOTE — Telephone Encounter (Signed)
I will defer starting a new statin to Dr. Charlett Blake.

## 2013-05-18 NOTE — Telephone Encounter (Signed)
Have her come sign a release so we can call ourselves and get records or ask them ourselves. Insurance will not pay til I can list what she has failed. Her old pharmacy might be able to tell her what she has taken in the past as well

## 2013-05-19 NOTE — Telephone Encounter (Signed)
Left a detailed message on pt's vm  

## 2013-05-20 ENCOUNTER — Telehealth: Payer: Self-pay | Admitting: Family Medicine

## 2013-05-20 ENCOUNTER — Telehealth: Payer: Self-pay

## 2013-05-20 NOTE — Telephone Encounter (Signed)
Called to say she did not fail with livalo.  She started taking coQ10 with the livalo and has been fine ever since.  Please call her and let her know what to do

## 2013-05-20 NOTE — Telephone Encounter (Signed)
We received paperwork from MD Dr Patrice Paradise (pts physician in [place]).   States pt has failed the following:  Pravastatin Livalo Zocor All due to myalgia   Left a detailed message on vm for patient to return my call

## 2013-05-21 NOTE — Telephone Encounter (Signed)
Please get new paperwork for the pa for Livalo

## 2013-05-21 NOTE — Telephone Encounter (Signed)
We now received paperwork from Dr Towanda Malkin office stating patient has tried and failed pravastatin and zocor due to side effects of myalgia. Pt is able to tolerate Livalo for her hyperlipidemia

## 2013-05-26 ENCOUNTER — Encounter: Payer: Self-pay | Admitting: Physician Assistant

## 2013-05-26 ENCOUNTER — Ambulatory Visit (INDEPENDENT_AMBULATORY_CARE_PROVIDER_SITE_OTHER): Payer: 59 | Admitting: Physician Assistant

## 2013-05-26 VITALS — BP 98/76 | HR 51 | Temp 98.0°F | Resp 14 | Ht 65.0 in | Wt 143.0 lb

## 2013-05-26 DIAGNOSIS — H6990 Unspecified Eustachian tube disorder, unspecified ear: Secondary | ICD-10-CM | POA: Insufficient documentation

## 2013-05-26 DIAGNOSIS — H6980 Other specified disorders of Eustachian tube, unspecified ear: Secondary | ICD-10-CM | POA: Insufficient documentation

## 2013-05-26 MED ORDER — FLUTICASONE PROPIONATE 50 MCG/ACT NA SUSP: 2.0000 | Freq: Every day | NASAL | Status: DC

## 2013-05-26 NOTE — Progress Notes (Signed)
Pre visit review using our clinic review tool, if applicable. No additional management support is needed unless otherwise documented below in the visit note/SLS  

## 2013-05-26 NOTE — Patient Instructions (Signed)
Increase fluid intake.  Take Flonase as directed.  Take a claritin daily.  Place a humidifier in the bedroom.  Please call or return to clinic if symptoms are not improving.

## 2013-05-26 NOTE — Progress Notes (Signed)
Patient presents to clinic today c/o fullness and pressure of the right ear that has been present for 1.5 weeks. Patient denies fever, chills, malaise or fatigue. Denies change in hearing, ear drainage or tinnitus. Denies overt ear pain. Endorses some sinus pressure initially that has since resolved. Denies sinus pain, postnasal drip, sore throat cough.  Denies recent travel or sick contact. States she feels she would be obtained she could just "pop" her ear.  Past Medical History  Diagnosis Date  . Chicken pox as a child  . Shingles 43  . Measles as a child  . Hyperlipidemia   . Atrial fibrillation 2010  . Heart murmur   . Spondylisthesis 2012    L5/S1 grade 3  . Abdominal wall hernia 08/04/2012  . Ventral hernia 08/05/2012  . Varicose veins 08/05/2012    Extending up to right groin B/l LE  . DJD (degenerative joint disease) 08/05/2012  . Paroxysmal a-fib   . Other and unspecified hyperlipidemia 08/05/2012  . Depression with anxiety 12/27/2012    Current Outpatient Prescriptions on File Prior to Visit  Medication Sig Dispense Refill  . Ascorbic Acid (VITAMIN C) 100 MG tablet Take 100 mg by mouth daily.      Marland Kitchen aspirin 81 MG tablet Take 81 mg by mouth daily.      . Calcium 200 MG TABS Take 1 tablet by mouth daily.      . cholecalciferol (VITAMIN D) 400 UNITS TABS Take 400 Units by mouth daily.      . Cholecalciferol (VITAMIN D-3) 1000 UNITS CAPS Take 2,000 Units by mouth daily.      Marland Kitchen Co-Enzyme Q-10 100 MG CAPS Take 300 mg by mouth daily.      . Docosahexaenoic Acid (DHA) 200 MG CAPS Take 1 capsule by mouth daily.      . Fenoprofen Calcium (NALFON) 200 MG CAPS Take by mouth.      . Glucosamine-Chondroitin (GLUCOSAMINE CHONDR COMPLEX PO) Take by mouth.      . levothyroxine (SYNTHROID, LEVOTHROID) 88 MCG tablet Take 1 tablet (88 mcg total) by mouth daily before breakfast.  90 tablet  1  . LORazepam (ATIVAN) 0.5 MG tablet Take 1 tablet (0.5 mg total) by mouth 2 (two) times daily as needed for  anxiety.  20 tablet  1  . MAGNESIUM CITRATE PO Take 75 mg by mouth daily.      . Multiple Vitamin (MULTIVITAMIN) tablet Take 1 tablet by mouth daily.      . Omega-3 Fatty Acids (EPA PO) Take 300 mg by mouth daily.      . Pitavastatin Calcium (LIVALO) 2 MG TABS Take 1 tablet (2 mg total) by mouth daily.  90 tablet  3  . VITAMIN E COMPLEX PO Take 1 tablet by mouth daily.       No current facility-administered medications on file prior to visit.    Allergies  Allergen Reactions  . Crestor [Rosuvastatin]     Joint pain w/fibromyalgia  . Penicillins     Hands and feet swell, rash  . Pravastatin     Joint pain w/fibromyalgia    Family History  Problem Relation Age of Onset  . Cancer Mother     lung- smoker  . Liver disease Mother   . Cancer Father     lung- smoker  . Peripheral Artery Disease Sister   . Dementia Maternal Grandmother   . Heart attack Maternal Grandfather   . Asthma Son   . Ulcerative colitis Son   .  Asthma Son   . Ulcerative colitis Son   . Heart attack Paternal Uncle     History   Social History  . Marital Status: Married    Spouse Name: N/A    Number of Children: N/A  . Years of Education: N/A   Social History Main Topics  . Smoking status: Never Smoker   . Smokeless tobacco: Never Used  . Alcohol Use: No  . Drug Use: No  . Sexual Activity: None   Other Topics Concern  . None   Social History Narrative  . None   Review of Systems - See HPI.  All other ROS are negative.  BP 98/76  Pulse 51  Temp(Src) 98 F (36.7 C) (Oral)  Resp 14  Ht 5\' 5"  (1.651 m)  Wt 143 lb (64.864 kg)  BMI 23.80 kg/m2  SpO2 98%  Physical Exam  Vitals reviewed. Constitutional: She is oriented to person, place, and time and well-developed, well-nourished, and in no distress.  HENT:  Head: Normocephalic and atraumatic.  Right Ear: Hearing, external ear and ear canal normal. No drainage. Tympanic membrane is retracted. Tympanic membrane is not perforated, not  erythematous and not bulging.  Left Ear: Hearing, tympanic membrane, external ear and ear canal normal.  Nose: Nose normal.  Mouth/Throat: Uvula is midline, oropharynx is clear and moist and mucous membranes are normal. No oropharyngeal exudate.  No tenderness noted on percussion of sinuses.  Eyes: Conjunctivae are normal. Pupils are equal, round, and reactive to light.  Neck: Neck supple.  Cardiovascular: Normal rate, regular rhythm, normal heart sounds and intact distal pulses.   Pulmonary/Chest: Effort normal and breath sounds normal. No respiratory distress. She has no wheezes. She has no rales. She exhibits no tenderness.  Lymphadenopathy:    She has no cervical adenopathy.  Neurological: She is alert and oriented to person, place, and time.  Skin: Skin is warm and dry. No rash noted.  Psychiatric: Affect normal.    Recent Results (from the past 2160 hour(s))  TSH     Status: None   Collection Time    03/12/13  2:37 PM      Result Value Ref Range   TSH 0.766  0.350 - 4.500 uIU/mL    Assessment/Plan: ETD (eustachian tube dysfunction) Rx Flonase. Daily Claritin or Zyrtec. Humidifier in bedroom. Saline nasal spray. Call or return to clinic if symptoms not improving.

## 2013-05-26 NOTE — Telephone Encounter (Signed)
Please start a new PA for Livalo since we now have the information on what patient has tried and failed

## 2013-05-26 NOTE — Assessment & Plan Note (Signed)
Rx Flonase. Daily Claritin or Zyrtec. Humidifier in bedroom. Saline nasal spray. Call or return to clinic if symptoms not improving.

## 2013-05-28 ENCOUNTER — Encounter: Payer: Self-pay | Admitting: Family Medicine

## 2013-05-28 NOTE — Telephone Encounter (Signed)
PA form forward to nurse °

## 2013-06-07 NOTE — Telephone Encounter (Signed)
Can you check on this please?

## 2013-06-07 NOTE — Telephone Encounter (Signed)
PA form forward to nurse

## 2013-06-07 NOTE — Telephone Encounter (Signed)
I have not got the form back, do you want a new PA form?

## 2013-06-07 NOTE — Telephone Encounter (Signed)
Yes please

## 2013-06-18 NOTE — Telephone Encounter (Signed)
Please give her more samples

## 2013-06-18 NOTE — Telephone Encounter (Signed)
Pt states she spoke to insurance and they are waiting on MD to call them at [phone] about the [place]. Pt would like MD to do this today and call or email her with the information. Pt states her ID # with [facility] is [id number]   Pt also stated in message that as of today she is out of samples

## 2013-06-18 NOTE — Telephone Encounter (Signed)
So please notify patient

## 2013-06-18 NOTE — Telephone Encounter (Signed)
I spoke to [place] at [employer]. He said that the Livalo was approved.

## 2013-08-05 ENCOUNTER — Other Ambulatory Visit (HOSPITAL_COMMUNITY)
Admission: RE | Admit: 2013-08-05 | Discharge: 2013-08-05 | Disposition: A | Discharge status: Home / Self Care | Payer: 59 | Source: Ambulatory Visit | Attending: Family Medicine | Admitting: Family Medicine

## 2013-08-05 ENCOUNTER — Ambulatory Visit (INDEPENDENT_AMBULATORY_CARE_PROVIDER_SITE_OTHER): Payer: 59 | Admitting: Family Medicine

## 2013-08-05 ENCOUNTER — Encounter: Payer: Self-pay | Admitting: Family Medicine

## 2013-08-05 VITALS — BP 104/64 | HR 50 | Temp 98.4°F | Ht 65.0 in

## 2013-08-05 DIAGNOSIS — E039 Hypothyroidism, unspecified: Secondary | ICD-10-CM

## 2013-08-05 DIAGNOSIS — Z0000 Encounter for general adult medical examination without abnormal findings: Secondary | ICD-10-CM

## 2013-08-05 DIAGNOSIS — E785 Hyperlipidemia, unspecified: Secondary | ICD-10-CM

## 2013-08-05 DIAGNOSIS — Z01419 Encounter for gynecological examination (general) (routine) without abnormal findings: Secondary | ICD-10-CM | POA: Insufficient documentation

## 2013-08-05 DIAGNOSIS — Z124 Encounter for screening for malignant neoplasm of cervix: Secondary | ICD-10-CM

## 2013-08-05 DIAGNOSIS — N952 Postmenopausal atrophic vaginitis: Secondary | ICD-10-CM

## 2013-08-05 LAB — HEPATIC FUNCTION PANEL
ALBUMIN: 4.1 g/dL (ref 3.5–5.2)
ALT: 35 U/L (ref 0–35)
AST: 38 U/L — AB (ref 0–37)
Alkaline Phosphatase: 55 U/L (ref 39–117)
BILIRUBIN DIRECT: 0.2 mg/dL (ref 0.0–0.3)
Indirect Bilirubin: 0.9 mg/dL (ref 0.2–1.2)
Total Bilirubin: 1.1 mg/dL (ref 0.2–1.2)
Total Protein: 6.2 g/dL (ref 6.0–8.3)

## 2013-08-05 LAB — LIPID PANEL
CHOL/HDL RATIO: 2.8 ratio
Cholesterol: 165 mg/dL (ref 0–200)
HDL: 58 mg/dL (ref >39)
LDL Cholesterol: 98 mg/dL (ref 0–99)
Triglycerides: 43 mg/dL (ref <150)
VLDL: 9 mg/dL (ref 0–40)

## 2013-08-05 LAB — RENAL FUNCTION PANEL
Albumin: 4.1 g/dL (ref 3.5–5.2)
BUN: 16 mg/dL (ref 6–23)
CHLORIDE: 102 meq/L (ref 96–112)
CO2: 28 meq/L (ref 19–32)
CREATININE: 0.7 mg/dL (ref 0.50–1.10)
Calcium: 9.1 mg/dL (ref 8.4–10.5)
Glucose, Bld: 84 mg/dL (ref 70–99)
PHOSPHORUS: 3.9 mg/dL (ref 2.3–4.6)
POTASSIUM: 4.4 meq/L (ref 3.5–5.3)
Sodium: 136 mEq/L (ref 135–145)

## 2013-08-05 LAB — CBC
HEMATOCRIT: 35 % — AB (ref 36.0–46.0)
Hemoglobin: 11.7 g/dL — ABNORMAL LOW (ref 12.0–15.0)
MCH: 28.1 pg (ref 26.0–34.0)
MCHC: 33.4 g/dL (ref 30.0–36.0)
MCV: 84.1 fL (ref 78.0–100.0)
Platelets: 220 10*3/uL (ref 150–400)
RBC: 4.16 MIL/uL (ref 3.87–5.11)
RDW: 13.6 % (ref 11.5–15.5)
WBC: 3.9 10*3/uL — AB (ref 4.0–10.5)

## 2013-08-05 MED ORDER — ESTROGENS, CONJUGATED 0.625 MG/GM VA CREA: 1.0000 | TOPICAL_CREAM | Freq: Every day | VAGINAL | Status: DC

## 2013-08-05 NOTE — Progress Notes (Signed)
Pre visit review using our clinic review tool, if applicable. No additional management support is needed unless otherwise documented below in the visit note. 

## 2013-08-05 NOTE — Patient Instructions (Addendum)

## 2013-08-06 ENCOUNTER — Telehealth: Payer: Self-pay

## 2013-08-06 DIAGNOSIS — R7989 Other specified abnormal findings of blood chemistry: Secondary | ICD-10-CM

## 2013-08-06 DIAGNOSIS — D649 Anemia, unspecified: Secondary | ICD-10-CM

## 2013-08-06 DIAGNOSIS — R945 Abnormal results of liver function studies: Secondary | ICD-10-CM

## 2013-08-06 LAB — TSH: TSH: 1.791 u[IU]/mL (ref 0.350–4.500)

## 2013-08-06 NOTE — Telephone Encounter (Signed)
Lab order placed per md

## 2013-08-08 ENCOUNTER — Encounter: Payer: Self-pay | Admitting: Family Medicine

## 2013-08-08 DIAGNOSIS — Z0000 Encounter for general adult medical examination without abnormal findings: Secondary | ICD-10-CM | POA: Insufficient documentation

## 2013-08-08 DIAGNOSIS — N952 Postmenopausal atrophic vaginitis: Secondary | ICD-10-CM | POA: Insufficient documentation

## 2013-08-08 DIAGNOSIS — E039 Hypothyroidism, unspecified: Secondary | ICD-10-CM | POA: Insufficient documentation

## 2013-08-08 NOTE — Assessment & Plan Note (Signed)
On Levothyroxine, continue to monitor 

## 2013-08-08 NOTE — Progress Notes (Signed)
Patient ID: Crystal Russell, female   DOB: 12-27-1950, 63 y.o.   MRN: [id number] Crystal Russell [id number] 12-03-1950 08/08/2013      Progress Note-Follow Up  Subjective  Chief Complaint  Chief Complaint  Patient presents with  . Annual Exam    physical  . Gynecologic Exam    pap    HPI  Patient is a 63 year old female in today for routine medical care. He'll exam Pap smear. Is complaining of some disparity hernia and thinning of her mucosa but otherwise noted GYN complaints. No recent illness. Is having a lot of stress at home but is managing well. Denies CP/palp/SOB/HA/congestion/fevers/GI or GU c/o. Taking meds as prescribed  Past Medical History  Diagnosis Date  . Chicken pox as a child  . Shingles 43  . Measles as a child  . Hyperlipidemia   . Atrial fibrillation 2010  . Heart murmur   . Spondylisthesis 2012    L5/S1 grade 3  . Abdominal wall hernia 08/04/2012  . Ventral hernia 08/05/2012  . Varicose veins 08/05/2012    Extending up to right groin B/l LE  . DJD (degenerative joint disease) 08/05/2012  . Paroxysmal a-fib   . Other and unspecified hyperlipidemia 08/05/2012  . Depression with anxiety 12/27/2012    Past Surgical History  Procedure Laterality Date  . Varicose vein surgery  1995    right leg    Family History  Problem Relation Age of Onset  . Cancer Mother     lung- smoker  . Liver disease Mother   . Cancer Father     lung- smoker  . Peripheral Artery Disease Sister   . Dementia Maternal Grandmother   . Heart attack Maternal Grandfather   . Asthma Son   . Ulcerative colitis Son   . Asthma Son   . Ulcerative colitis Son   . Heart attack Paternal Uncle     History   Social History  . Marital Status: Married    Spouse Name: N/A    Number of Children: N/A  . Years of Education: N/A   Occupational History  . Not on file.   Social History Main Topics  . Smoking status: Never Smoker   . Smokeless tobacco: Never Used  . Alcohol Use: No  . Drug  Use: No  . Sexual Activity: Not on file   Other Topics Concern  . Not on file   Social History Narrative  . No narrative on file    Current Outpatient Prescriptions on File Prior to Visit  Medication Sig Dispense Refill  . Ascorbic Acid (VITAMIN C) 100 MG tablet Take 100 mg by mouth daily.      Marland Kitchen aspirin 81 MG tablet Take 81 mg by mouth daily.      . Calcium 200 MG TABS Take 1 tablet by mouth daily.      . cholecalciferol (VITAMIN D) 400 UNITS TABS Take 400 Units by mouth daily.      . Cholecalciferol (VITAMIN D-3) 1000 UNITS CAPS Take 2,000 Units by mouth daily.      Marland Kitchen Co-Enzyme Q-10 100 MG CAPS Take 300 mg by mouth daily.      . Docosahexaenoic Acid (DHA) 200 MG CAPS Take 1 capsule by mouth daily.      . Fenoprofen Calcium (NALFON) 200 MG CAPS Take by mouth.      . fluticasone (FLONASE) 50 MCG/ACT nasal spray Place 2 sprays into both nostrils daily.  16 g  3  . Glucosamine-Chondroitin (  GLUCOSAMINE CHONDR COMPLEX PO) Take by mouth.      . levothyroxine (SYNTHROID, LEVOTHROID) 88 MCG tablet Take 1 tablet (88 mcg total) by mouth daily before breakfast.  90 tablet  1  . LORazepam (ATIVAN) 0.5 MG tablet Take 1 tablet (0.5 mg total) by mouth 2 (two) times daily as needed for anxiety.  20 tablet  1  . MAGNESIUM CITRATE PO Take 75 mg by mouth daily.      . Multiple Vitamin (MULTIVITAMIN) tablet Take 1 tablet by mouth daily.      . Omega-3 Fatty Acids (EPA PO) Take 300 mg by mouth daily.      . Pitavastatin Calcium (LIVALO) 2 MG TABS Take 1 tablet (2 mg total) by mouth daily.  90 tablet  3  . VITAMIN E COMPLEX PO Take 1 tablet by mouth daily.       No current facility-administered medications on file prior to visit.    Allergies  Allergen Reactions  . Crestor [Rosuvastatin]     Joint pain w/fibromyalgia  . Penicillins     Hands and feet swell, rash  . Pravastatin     Joint pain w/fibromyalgia    Review of Systems  Review of Systems  Constitutional: Negative for fever, chills  and malaise/fatigue.  HENT: Negative for congestion, hearing loss and nosebleeds.   Eyes: Negative for discharge.  Respiratory: Negative for cough, sputum production, shortness of breath and wheezing.   Cardiovascular: Negative for chest pain, palpitations and leg swelling.  Gastrointestinal: Negative for heartburn, nausea, vomiting, abdominal pain, diarrhea, constipation and blood in stool.  Genitourinary: Negative for dysuria, urgency, frequency and hematuria.  Musculoskeletal: Negative for back pain, falls and myalgias.  Skin: Negative for rash.  Neurological: Negative for dizziness, tremors, sensory change, focal weakness, loss of consciousness, weakness and headaches.  Endo/Heme/Allergies: Negative for polydipsia. Does not bruise/bleed easily.  Psychiatric/Behavioral: Negative for depression and suicidal ideas. The patient is not nervous/anxious and does not have insomnia.     Objective  BP 104/64  Pulse 50  Temp(Src) 98.4 F (36.9 C) (Oral)  Ht 5\' 5"  (1.651 m)  SpO2 97%  Physical Exam  Physical Exam  Constitutional: She is oriented to person, place, and time and well-developed, well-nourished, and in no distress. No distress.  HENT:  Head: Normocephalic and atraumatic.  Right Ear: External ear normal.  Left Ear: External ear normal.  Nose: Nose normal.  Mouth/Throat: Oropharynx is clear and moist. No oropharyngeal exudate.  Eyes: Conjunctivae are normal. Pupils are equal, round, and reactive to light. Right eye exhibits no discharge. Left eye exhibits no discharge. No scleral icterus.  Neck: Normal range of motion. Neck supple. No thyromegaly present.  Cardiovascular: Normal rate, regular rhythm, normal heart sounds and intact distal pulses.   No murmur heard. Pulmonary/Chest: Effort normal and breath sounds normal. No respiratory distress. She has no wheezes. She has no rales.  Abdominal: Soft. Bowel sounds are normal. She exhibits no distension and no mass. There is no  tenderness.  Musculoskeletal: Normal range of motion. She exhibits no edema and no tenderness.  Diffuse varicose veins in b/l legs, L>R  Lymphadenopathy:    She has no cervical adenopathy.  Neurological: She is alert and oriented to person, place, and time. She has normal reflexes. No cranial nerve deficit. Coordination normal.  Skin: Skin is warm and dry. No rash noted. She is not diaphoretic.  Psychiatric: Mood, memory and affect normal.    Lab Results  Component Value Date  TSH 1.791 08/05/2013   Lab Results  Component Value Date   WBC 3.9* 08/05/2013   HGB 11.7* 08/05/2013   HCT 35.0* 08/05/2013   MCV 84.1 08/05/2013   PLT 220 08/05/2013   Lab Results  Component Value Date   CREATININE 0.70 08/05/2013   BUN 16 08/05/2013   NA 136 08/05/2013   K 4.4 08/05/2013   CL 102 08/05/2013   CO2 28 08/05/2013   Lab Results  Component Value Date   ALT 35 08/05/2013   AST 38* 08/05/2013   ALKPHOS 55 08/05/2013   BILITOT 1.1 08/05/2013   Lab Results  Component Value Date   CHOL 165 08/05/2013   Lab Results  Component Value Date   HDL 58 08/05/2013   Lab Results  Component Value Date   LDLCALC 98 08/05/2013   Lab Results  Component Value Date   TRIG 43 08/05/2013   Lab Results  Component Value Date   CHOLHDL 2.8 08/05/2013     Assessment & Plan  Cervical cancer screening Pap today  Atrophic vaginitis Will try Premarin cream sparingly  Unspecified hypothyroidism On Levothyroxine, continue to monitor  Other and unspecified hyperlipidemia Tolerating statin, encouraged heart healthy diet, avoid trans fats, minimize simple carbs and saturated fats. Increase exercise as tolerated

## 2013-08-08 NOTE — Assessment & Plan Note (Signed)
Patient encouraged to maintain heart healthy diet, regular exercise, adequate sleep. Consider daily probiotics. Take medications as prescribed 

## 2013-08-08 NOTE — Assessment & Plan Note (Signed)
Tolerating statin, encouraged heart healthy diet, avoid trans fats, minimize simple carbs and saturated fats. Increase exercise as tolerated 

## 2013-08-08 NOTE — Assessment & Plan Note (Signed)
Will try Premarin cream sparingly

## 2013-08-08 NOTE — Assessment & Plan Note (Signed)
Pap today 

## 2013-08-09 ENCOUNTER — Encounter: Payer: Self-pay | Admitting: Family Medicine

## 2013-10-20 ENCOUNTER — Telehealth: Payer: Self-pay | Admitting: Family Medicine

## 2013-10-20 MED ORDER — LEVOTHYROXINE SODIUM 88 MCG PO TABS
88.0000 ug | ORAL_TABLET | Freq: Every day | ORAL | Status: DC
Start: 2013-10-20 — End: 2014-04-05

## 2013-10-20 NOTE — Telephone Encounter (Signed)
Refill granted and sent in to her pharmacy.

## 2013-10-20 NOTE — Telephone Encounter (Signed)
Requesting refill on:  Levothyroxine 88 mcg, take 1 tab by mouth before breakfast daily QTY 90  Please call into [facility] Ph [phone] Fax [phone]

## 2013-10-24 ENCOUNTER — Encounter: Payer: Self-pay | Admitting: Family Medicine

## 2013-10-27 ENCOUNTER — Encounter: Payer: Self-pay | Admitting: Physician Assistant

## 2013-10-27 ENCOUNTER — Telehealth: Payer: Self-pay | Admitting: Family Medicine

## 2013-10-27 ENCOUNTER — Ambulatory Visit (INDEPENDENT_AMBULATORY_CARE_PROVIDER_SITE_OTHER): Payer: 59 | Admitting: Physician Assistant

## 2013-10-27 ENCOUNTER — Other Ambulatory Visit (HOSPITAL_COMMUNITY)
Admission: RE | Admit: 2013-10-27 | Discharge: 2013-10-27 | Disposition: A | Discharge status: Home / Self Care | Payer: 59 | Source: Ambulatory Visit | Attending: Physician Assistant | Admitting: Physician Assistant

## 2013-10-27 VITALS — BP 122/76 | HR 54 | Temp 98.3°F | Resp 16 | Ht 65.0 in | Wt 145.0 lb

## 2013-10-27 DIAGNOSIS — I8390 Asymptomatic varicose veins of unspecified lower extremity: Secondary | ICD-10-CM

## 2013-10-27 DIAGNOSIS — N6459 Other signs and symptoms in breast: Secondary | ICD-10-CM

## 2013-10-27 DIAGNOSIS — N6452 Nipple discharge: Secondary | ICD-10-CM

## 2013-10-27 DIAGNOSIS — L739 Follicular disorder, unspecified: Secondary | ICD-10-CM

## 2013-10-27 DIAGNOSIS — L738 Other specified follicular disorders: Secondary | ICD-10-CM

## 2013-10-27 DIAGNOSIS — I83893 Varicose veins of bilateral lower extremities with other complications: Secondary | ICD-10-CM

## 2013-10-27 MED ORDER — MUPIROCIN 2 % EX OINT: 1.0000 "application " | TOPICAL_OINTMENT | Freq: Three times a day (TID) | CUTANEOUS | Status: DC

## 2013-10-27 NOTE — Telephone Encounter (Signed)
Patient would like to transfer from Dr. Charlett Blake to Elyn Aquas, PA-C. Is this ok? Thanks!

## 2013-10-27 NOTE — Telephone Encounter (Signed)
That is fine with me.

## 2013-10-27 NOTE — Patient Instructions (Addendum)
You will be contacted by a Vascular specialist concerning your severe venous insufficiency.  I recommend you wear your compression stockings.  You will be contacted for your breast ultrasound.  I am sending in your discharge specimen to see what it is composed of.  After I have your results, we will get you in to see a breast surgeon if indicated.  Please keep [place] informed of any new symptoms.

## 2013-10-27 NOTE — Progress Notes (Signed)
Pre visit review using our clinic review tool, if applicable. No additional management support is needed unless otherwise documented below in the visit note/SLS  

## 2013-10-27 NOTE — Telephone Encounter (Signed)
Ok with me 

## 2013-11-02 ENCOUNTER — Other Ambulatory Visit: Payer: Self-pay | Admitting: Physician Assistant

## 2013-11-02 DIAGNOSIS — N6452 Nipple discharge: Secondary | ICD-10-CM

## 2013-11-03 ENCOUNTER — Other Ambulatory Visit: Payer: Self-pay | Admitting: *Deleted

## 2013-11-03 DIAGNOSIS — I83893 Varicose veins of bilateral lower extremities with other complications: Secondary | ICD-10-CM

## 2013-11-05 DIAGNOSIS — N6452 Nipple discharge: Secondary | ICD-10-CM | POA: Insufficient documentation

## 2013-11-05 NOTE — Progress Notes (Signed)
Patient presents to clinic today c/o bloody discharge from her right breast x 3 days.  Denies noted mass, lesion, tenderness or skin changes.  Denies change to nipple.  Discharge must be expressed from nipple.  Does not occur spontaneously. Denies discharge from left breast.  Last Mammogram 04/2013 with no abnormalities.   Past Medical History  Diagnosis Date  . Chicken pox as a child  . Shingles 43  . Measles as a child  . Hyperlipidemia   . Atrial fibrillation 2010  . Heart murmur   . Spondylisthesis 2012    L5/S1 grade 3  . Abdominal wall hernia 08/04/2012  . Ventral hernia 08/05/2012  . Varicose veins 08/05/2012    Extending up to right groin B/l LE  . DJD (degenerative joint disease) 08/05/2012  . Paroxysmal a-fib   . Other and unspecified hyperlipidemia 08/05/2012  . Depression with anxiety 12/27/2012    Current Outpatient Prescriptions on File Prior to Visit  Medication Sig Dispense Refill  . Ascorbic Acid (VITAMIN C) 100 MG tablet Take 100 mg by mouth daily.      Marland Kitchen aspirin 81 MG tablet Take 81 mg by mouth daily.      . Calcium 200 MG TABS Take 1 tablet by mouth daily.      . cholecalciferol (VITAMIN D) 400 UNITS TABS Take 400 Units by mouth daily.      . Cholecalciferol (VITAMIN D-3) 1000 UNITS CAPS Take 2,000 Units by mouth daily.      Marland Kitchen Co-Enzyme Q-10 100 MG CAPS Take 300 mg by mouth daily.      Marland Kitchen conjugated estrogens (PREMARIN) vaginal cream Place 1 Applicatorful vaginally daily. Use as directed, drop to twice a week as tolerated  42.5 g  12  . Docosahexaenoic Acid (DHA) 200 MG CAPS Take 1 capsule by mouth daily.      . Glucosamine-Chondroitin (GLUCOSAMINE CHONDR COMPLEX PO) Take by mouth.      . levothyroxine (SYNTHROID, LEVOTHROID) 88 MCG tablet Take 1 tablet (88 mcg total) by mouth daily before breakfast.  90 tablet  1  . MAGNESIUM CITRATE PO Take 75 mg by mouth daily.      . Multiple Vitamin (MULTIVITAMIN) tablet Take 1 tablet by mouth daily.      . Omega-3 Fatty Acids  (EPA PO) Take 300 mg by mouth daily.      . Pitavastatin Calcium (LIVALO) 2 MG TABS Take 1 tablet (2 mg total) by mouth daily.  90 tablet  3  . VITAMIN E COMPLEX PO Take 1 tablet by mouth daily.       No current facility-administered medications on file prior to visit.    Allergies  Allergen Reactions  . Crestor [Rosuvastatin]     Joint pain w/fibromyalgia  . Penicillins     Hands and feet swell, rash  . Pravastatin     Joint pain w/fibromyalgia    Family History  Problem Relation Age of Onset  . Cancer Mother     lung- smoker  . Liver disease Mother   . Cancer Father     lung- smoker  . Peripheral Artery Disease Sister   . Dementia Maternal Grandmother   . Heart attack Maternal Grandfather   . Asthma Son   . Ulcerative colitis Son   . Asthma Son   . Ulcerative colitis Son   . Heart attack Paternal Uncle     History   Social History  . Marital Status: Married    Spouse Name: N/A  Number of Children: N/A  . Years of Education: N/A   Social History Main Topics  . Smoking status: Never Smoker   . Smokeless tobacco: Never Used  . Alcohol Use: No  . Drug Use: No  . Sexual Activity: None   Other Topics Concern  . None   Social History Narrative  . None   Review of Systems - See HPI.  All other ROS are negative.  BP 122/76  Pulse 54  Temp(Src) 98.3 F (36.8 C) (Oral)  Resp 16  Ht 5\' 5"  (1.651 m)  Wt 145 lb (65.772 kg)  BMI 24.13 kg/m2  SpO2 98%  Physical Exam  Vitals reviewed. Constitutional: She is oriented to person, place, and time and well-developed, well-nourished, and in no distress.  HENT:  Head: Normocephalic and atraumatic.  Cardiovascular: Normal rate, regular rhythm, normal heart sounds and intact distal pulses.   Significant venous distention and tortuosity of veins noted on examination.   Pulmonary/Chest: Effort normal and breath sounds normal. No respiratory distress. She has no wheezes. She has no rales. She exhibits no tenderness.  Right breast exhibits nipple discharge. Right breast exhibits no inverted nipple, no mass, no skin change and no tenderness. Left breast exhibits no inverted nipple, no mass, no nipple discharge, no skin change and no tenderness. Breasts are symmetrical.  Presence of a sanguinous discharge expressed from right nipple in small amounts.   Neurological: She is alert and oriented to person, place, and time.  Skin: Skin is warm and dry. No rash noted.  Psychiatric: Affect normal.   Assessment/Plan: Varicose veins Severe.  Referral to Vein specialist placed for assessment and therapy.  Nipple discharge Discharge sent for culture and cytology. Will obtain diagnostic mammography and ductogram.  Will proceed once results are in.

## 2013-11-05 NOTE — Assessment & Plan Note (Signed)
Severe.  Referral to Vein specialist placed for assessment and therapy.

## 2013-11-05 NOTE — Assessment & Plan Note (Signed)
Discharge sent for culture and cytology. Will obtain diagnostic mammography and ductogram.  Will proceed once results are in.

## 2013-11-12 ENCOUNTER — Ambulatory Visit
Admission: RE | Admit: 2013-11-12 | Discharge: 2013-11-12 | Disposition: A | Discharge status: Home / Self Care | Payer: 59 | Source: Ambulatory Visit | Attending: Physician Assistant | Admitting: Physician Assistant

## 2013-11-12 DIAGNOSIS — N6452 Nipple discharge: Secondary | ICD-10-CM

## 2013-12-07 ENCOUNTER — Encounter: Payer: Self-pay | Admitting: Vascular Surgery

## 2013-12-08 ENCOUNTER — Ambulatory Visit (HOSPITAL_COMMUNITY)
Admission: RE | Admit: 2013-12-08 | Discharge: 2013-12-08 | Disposition: A | Discharge status: Home / Self Care | Payer: 59 | Source: Ambulatory Visit | Attending: Vascular Surgery | Admitting: Vascular Surgery

## 2013-12-08 ENCOUNTER — Ambulatory Visit (INDEPENDENT_AMBULATORY_CARE_PROVIDER_SITE_OTHER): Payer: 59 | Admitting: Vascular Surgery

## 2013-12-08 ENCOUNTER — Encounter: Payer: Self-pay | Admitting: Vascular Surgery

## 2013-12-08 ENCOUNTER — Other Ambulatory Visit: Payer: Self-pay | Admitting: Family Medicine

## 2013-12-08 VITALS — BP 127/72 | HR 49 | Resp 16 | Ht 64.5 in | Wt 145.0 lb

## 2013-12-08 DIAGNOSIS — I83893 Varicose veins of bilateral lower extremities with other complications: Secondary | ICD-10-CM

## 2013-12-08 NOTE — Progress Notes (Signed)
HISTORY AND PHYSICAL   CC:  Varicose veins  Crystal Lukes, MD  HPI: This is a 63 y.o. female who presents today for evaluation of her varicose veins.  She states she had her right saphenous vein stripped ~ 20 years ago.  She states that she does not have much pain with these, but does have an ache in her legs towards the end of the day.  She states that she does get some burning in the left leg.  She does c/o swelling in both legs.   She does not elevate her legs except while sleeping.  She has worn compression stockings (knee high) in the past, but these were at the level of her varicosities and caused her pain.  She does have history of pregnancy and both of her parents have hx of varicosities.    Past Medical History  Diagnosis Date  . Chicken pox as a child  . Shingles 43  . Measles as a child  . Hyperlipidemia   . Atrial fibrillation 2010  . Heart murmur   . Spondylisthesis 2012    L5/S1 grade 3  . Abdominal wall hernia 08/04/2012  . Ventral hernia 08/05/2012  . Varicose veins 08/05/2012    Extending up to right groin B/l LE  . DJD (degenerative joint disease) 08/05/2012  . Paroxysmal a-fib   . Other and unspecified hyperlipidemia 08/05/2012  . Depression with anxiety 12/27/2012   Past Surgical History  Procedure Laterality Date  . Varicose vein surgery  1995    right leg   Allergies  Allergen Reactions  . Crestor [Rosuvastatin]     Joint pain w/fibromyalgia  . Penicillins     Hands and feet swell, rash  . Pravastatin     Joint pain w/fibromyalgia   Current Outpatient Prescriptions  Medication Sig Dispense Refill  . Ascorbic Acid (VITAMIN C) 100 MG tablet Take 100 mg by mouth daily.      Marland Kitchen aspirin 81 MG tablet Take 81 mg by mouth daily.      . Calcium 200 MG TABS Take 1 tablet by mouth daily.      . cholecalciferol (VITAMIN D) 400 UNITS TABS Take 400 Units by mouth daily.      . Cholecalciferol (VITAMIN D-3) 1000 UNITS CAPS Take 2,000 Units by mouth daily.      Marland Kitchen  Co-Enzyme Q-10 100 MG CAPS Take 300 mg by mouth daily.      Marland Kitchen conjugated estrogens (PREMARIN) vaginal cream Place 1 Applicatorful vaginally daily. Use as directed, drop to twice a week as tolerated  42.5 g  12  . Docosahexaenoic Acid (DHA) 200 MG CAPS Take 1 capsule by mouth daily.      . Glucosamine-Chondroitin (GLUCOSAMINE CHONDR COMPLEX PO) Take by mouth.      . levothyroxine (SYNTHROID, LEVOTHROID) 88 MCG tablet Take 1 tablet (88 mcg total) by mouth daily before breakfast.  90 tablet  1  . MAGNESIUM CITRATE PO Take 75 mg by mouth daily.      . Multiple Vitamin (MULTIVITAMIN) tablet Take 1 tablet by mouth daily.      . mupirocin ointment (BACTROBAN) 2 % Apply 1 application topically 3 (three) times daily. Apply to affected area for 7-10 days.  30 g  3  . Omega-3 Fatty Acids (EPA PO) Take 300 mg by mouth daily.      . Pitavastatin Calcium (LIVALO) 2 MG TABS Take 1 tablet (2 mg total) by mouth daily.  90 tablet  3  .  VITAMIN E COMPLEX PO Take 1 tablet by mouth daily.       No current facility-administered medications for this visit.    Family History  Problem Relation Age of Onset  . Cancer Mother     lung- smoker  . Liver disease Mother   . Cancer Father     lung- smoker  . Peripheral Artery Disease Sister   . Dementia Maternal Grandmother   . Heart attack Maternal Grandfather   . Asthma Son   . Ulcerative colitis Son   . Asthma Son   . Ulcerative colitis Son   . Heart attack Paternal Uncle     History   Social History  . Marital Status: Married    Spouse Name: N/A    Number of Children: N/A  . Years of Education: N/A   Occupational History  . Not on file.   Social History Main Topics  . Smoking status: Never Smoker   . Smokeless tobacco: Never Used  . Alcohol Use: No  . Drug Use: No  . Sexual Activity: Not on file   Other Topics Concern  . Not on file   Social History Narrative  . No narrative on file   ROS: [x]  Positive   [ ]  Negative   [ ]  All sytems  reviewed and are negative  Cardiovascular: []  chest pain/pressure []  palpitations []  SOB lying flat []  DOE []  pain in legs while walking []  pain in feet when lying flat []  hx of DVT []  hx of phlebitis [x]  swelling in legs [x]  varicose veins  Pulmonary: []  productive cough []  asthma []  wheezing  Neurologic: []  weakness in []  arms []  legs []  numbness in []  arms []  legs [] difficulty speaking or slurred speech []  temporary loss of vision in one eye []  dizziness  Hematologic: []  bleeding problems []  problems with blood clotting easily  GI []  vomiting blood []  blood in stool  GU: []  burning with urination []  blood in urine  Psychiatric: []  hx of major depression  Integumentary: []  rashes []  ulcers  Constitutional: []  fever []  chills  PHYSICAL EXAMINATION:  Filed Vitals:   12/08/13 1441  BP: 127/72  Pulse: 49  Resp: 16   Body mass index is 24.51 kg/(m^2).  General:  WDWN in NAD Gait: Not observed HENT: WNL, normocephalic Eyes: Pupils equal Pulmonary: normal non-labored breathing , without Rales, rhonchi,  wheezing Cardiac: RRR, without  Murmurs, rubs or gallops; without carotid bruits Abdomen: soft, NT, no masses Skin: without rashes, without ulcers  Vascular Exam/Pulses:  Right Left  Radial 2+ (normal) 2+ (normal)  Ulnar 2+ (normal) 2+ (normal)  Popliteal Unable to palpate Unable to palpate  DP 2+ (normal) 2+ (normal)   Extremities: without ischemic changes, without Gangrene , without cellulitis; without open wounds; 1-2+ edema BLE; + varicosities bilaterally with L>R Musculoskeletal: no muscle wasting or atrophy  Neurologic: A&O X 3; Appropriate Affect ; SENSATION: normal; MOTOR FUNCTION:  moving all extremities equally. Speech is fluent/normal  Non-Invasive Vascular Imaging:   Lower extremity venous duplex reflux evaluation 12/08/13: 1.  There is no evidence of DVT or SVT 2.  There is evidence of deep vein reflux in BLE 3.  There is evidence  of an incompetent branch/anterior saphenous vein present in the right leg 4.  The right great saphenous vein is not seen; consistent with pt's medical hx 5.  There is evidence of great saphenous vein reflux in the left lower extremity. 6.  The small saphenous vein is competent  bliaterally  ASSESSMENT/PLAN:: 63 y.o. female with BLE varicose veins with the L>R.  -pt does have BLE edema with her varicosities.  We have suggested that she wear a mild gradient compression stocking (pantyhose), which will be more comfortable for the pt.   -also recommend leg elevation when not ambulating. -continue to ambulate as much as possible and avoid prolong sitting and standing. -she does not want a procedure at this time for her varicosities, but she does know that she will have to wear compression stockings that are at least thigh high for 3 months for insurance purposes. -she will f/u with [place] as needed.  Leontine Locket, PA-C [facility] [phone]  Clinic MD:  Pt seen and examined in conjunction with Dr. Scot Dock

## 2014-01-12 ENCOUNTER — Ambulatory Visit (INDEPENDENT_AMBULATORY_CARE_PROVIDER_SITE_OTHER): Payer: 59

## 2014-01-12 DIAGNOSIS — Z23 Encounter for immunization: Secondary | ICD-10-CM

## 2014-01-17 ENCOUNTER — Encounter: Payer: Self-pay | Admitting: Physician Assistant

## 2014-01-18 ENCOUNTER — Other Ambulatory Visit: Payer: Self-pay | Admitting: Family Medicine

## 2014-01-18 DIAGNOSIS — N6452 Nipple discharge: Secondary | ICD-10-CM

## 2014-01-20 ENCOUNTER — Encounter: Payer: Self-pay | Admitting: Family Medicine

## 2014-01-21 ENCOUNTER — Other Ambulatory Visit: Payer: Self-pay | Admitting: Family Medicine

## 2014-01-21 ENCOUNTER — Encounter: Payer: Self-pay | Admitting: Family Medicine

## 2014-01-21 NOTE — Telephone Encounter (Signed)
Please advise?  General 01/20/2014 12:12 PM Crystal Russell, Crystal Russell        Note    Patient aware this is in que for [facility] and they will call her directly to schedule

## 2014-02-05 ENCOUNTER — Encounter: Payer: Self-pay | Admitting: Family Medicine

## 2014-02-05 ENCOUNTER — Other Ambulatory Visit: Payer: Self-pay | Admitting: Family Medicine

## 2014-03-17 ENCOUNTER — Encounter: Payer: Self-pay | Admitting: Family Medicine

## 2014-03-17 ENCOUNTER — Other Ambulatory Visit: Payer: Self-pay | Admitting: Family Medicine

## 2014-03-17 DIAGNOSIS — N6452 Nipple discharge: Secondary | ICD-10-CM

## 2014-03-18 ENCOUNTER — Telehealth: Payer: Self-pay

## 2014-03-18 NOTE — Telephone Encounter (Signed)
[facility] - Neuro surgeon office Dr Saintclair Halsted [phone] ext [place] called to say that Adelie would need to see a different doctor than Dr Saintclair Halsted because he does not take this type of patient. She said something about patient may want to see Dr Donne Hazel.

## 2014-03-18 NOTE — Telephone Encounter (Signed)
Dr Donne Hazel is a Education officer, environmental. We will need a referral to General Surgery

## 2014-03-18 NOTE — Telephone Encounter (Signed)
Jenn, would this need to go to you?   Thanks, [employer]

## 2014-03-21 NOTE — Telephone Encounter (Signed)
Ok to place referral.

## 2014-03-22 NOTE — Telephone Encounter (Signed)
Referral was faxed to [facility] on 03/18/14/still awaiting appt

## 2014-03-22 NOTE — Telephone Encounter (Signed)
Yes Dr Donne Hazel is a great choice, should not need a new referral he is a Education officer, environmental and that is the kind of referral I placed. Thx

## 2014-04-05 ENCOUNTER — Other Ambulatory Visit: Payer: Self-pay | Admitting: Physician Assistant

## 2014-04-05 NOTE — Telephone Encounter (Signed)
Rx request to pharmacy/SLS  

## 2014-04-21 ENCOUNTER — Encounter: Payer: Self-pay | Admitting: Family Medicine

## 2014-05-02 ENCOUNTER — Encounter (HOSPITAL_BASED_OUTPATIENT_CLINIC_OR_DEPARTMENT_OTHER): Payer: Self-pay | Admitting: *Deleted

## 2014-05-02 ENCOUNTER — Other Ambulatory Visit (INDEPENDENT_AMBULATORY_CARE_PROVIDER_SITE_OTHER): Payer: Self-pay | Admitting: General Surgery

## 2014-05-02 NOTE — Progress Notes (Signed)
Pt moved from [place] 2013-hx af 2009-related to thyoid--has not had to see cardiology in 2 yr-no meds Called [facility] for orders-will let pt know if needs labs-asked for ekg-will ck with anesthesia

## 2014-05-03 ENCOUNTER — Other Ambulatory Visit: Payer: Self-pay

## 2014-05-03 ENCOUNTER — Encounter (HOSPITAL_BASED_OUTPATIENT_CLINIC_OR_DEPARTMENT_OTHER)
Admission: RE | Admit: 2014-05-03 | Discharge: 2014-05-03 | Disposition: A | Discharge status: Home / Self Care | Payer: 59 | Source: Ambulatory Visit | Attending: General Surgery | Admitting: General Surgery

## 2014-05-03 DIAGNOSIS — F419 Anxiety disorder, unspecified: Secondary | ICD-10-CM | POA: Diagnosis not present

## 2014-05-03 DIAGNOSIS — E079 Disorder of thyroid, unspecified: Secondary | ICD-10-CM | POA: Diagnosis not present

## 2014-05-03 DIAGNOSIS — Z87891 Personal history of nicotine dependence: Secondary | ICD-10-CM | POA: Diagnosis not present

## 2014-05-03 DIAGNOSIS — Z888 Allergy status to other drugs, medicaments and biological substances status: Secondary | ICD-10-CM | POA: Diagnosis not present

## 2014-05-03 DIAGNOSIS — E780 Pure hypercholesterolemia: Secondary | ICD-10-CM | POA: Diagnosis not present

## 2014-05-03 DIAGNOSIS — N6452 Nipple discharge: Secondary | ICD-10-CM | POA: Diagnosis present

## 2014-05-03 DIAGNOSIS — Z880 Allergy status to penicillin: Secondary | ICD-10-CM | POA: Diagnosis not present

## 2014-05-03 LAB — CBC WITH DIFFERENTIAL/PLATELET
BASOS PCT: 1 % (ref 0–1)
Basophils Absolute: 0 10*3/uL (ref 0.0–0.1)
Eosinophils Absolute: 0.1 10*3/uL (ref 0.0–0.7)
Eosinophils Relative: 1 % (ref 0–5)
HCT: 36.4 % (ref 36.0–46.0)
HEMOGLOBIN: 12.2 g/dL (ref 12.0–15.0)
LYMPHS PCT: 32 % (ref 12–46)
Lymphs Abs: 1.7 10*3/uL (ref 0.7–4.0)
MCH: 28.8 pg (ref 26.0–34.0)
MCHC: 33.5 g/dL (ref 30.0–36.0)
MCV: 85.8 fL (ref 78.0–100.0)
MONOS PCT: 9 % (ref 3–12)
Monocytes Absolute: 0.5 10*3/uL (ref 0.1–1.0)
NEUTROS ABS: 3.2 10*3/uL (ref 1.7–7.7)
Neutrophils Relative %: 57 % (ref 43–77)
PLATELETS: 203 10*3/uL (ref 150–400)
RBC: 4.24 MIL/uL (ref 3.87–5.11)
RDW: 12.2 % (ref 11.5–15.5)
WBC: 5.5 10*3/uL (ref 4.0–10.5)

## 2014-05-03 LAB — BASIC METABOLIC PANEL
ANION GAP: 10 (ref 5–15)
BUN: 14 mg/dL (ref 6–23)
CHLORIDE: 100 mmol/L (ref 96–112)
CO2: 27 mmol/L (ref 19–32)
CREATININE: 0.65 mg/dL (ref 0.50–1.10)
Calcium: 9.6 mg/dL (ref 8.4–10.5)
GFR calc non Af Amer: >90 mL/min (ref >90)
Glucose, Bld: 95 mg/dL (ref 70–99)
Potassium: 4.4 mmol/L (ref 3.5–5.1)
SODIUM: 137 mmol/L (ref 135–145)

## 2014-05-05 ENCOUNTER — Encounter (HOSPITAL_BASED_OUTPATIENT_CLINIC_OR_DEPARTMENT_OTHER)
Admission: RE | Disposition: A | Discharge status: Home / Self Care | Payer: Self-pay | Source: Ambulatory Visit | Attending: General Surgery

## 2014-05-05 ENCOUNTER — Ambulatory Visit (HOSPITAL_BASED_OUTPATIENT_CLINIC_OR_DEPARTMENT_OTHER)
Admission: RE | Admit: 2014-05-05 | Discharge: 2014-05-05 | Disposition: A | Discharge status: Home / Self Care | Payer: 59 | Source: Ambulatory Visit | Attending: General Surgery | Admitting: General Surgery

## 2014-05-05 ENCOUNTER — Encounter (HOSPITAL_BASED_OUTPATIENT_CLINIC_OR_DEPARTMENT_OTHER): Payer: Self-pay | Admitting: *Deleted

## 2014-05-05 ENCOUNTER — Ambulatory Visit (HOSPITAL_BASED_OUTPATIENT_CLINIC_OR_DEPARTMENT_OTHER): Payer: 59 | Admitting: Anesthesiology

## 2014-05-05 DIAGNOSIS — Z880 Allergy status to penicillin: Secondary | ICD-10-CM | POA: Insufficient documentation

## 2014-05-05 DIAGNOSIS — F419 Anxiety disorder, unspecified: Secondary | ICD-10-CM | POA: Insufficient documentation

## 2014-05-05 DIAGNOSIS — N6452 Nipple discharge: Secondary | ICD-10-CM | POA: Diagnosis not present

## 2014-05-05 DIAGNOSIS — E780 Pure hypercholesterolemia: Secondary | ICD-10-CM | POA: Insufficient documentation

## 2014-05-05 DIAGNOSIS — E079 Disorder of thyroid, unspecified: Secondary | ICD-10-CM | POA: Insufficient documentation

## 2014-05-05 DIAGNOSIS — Z87891 Personal history of nicotine dependence: Secondary | ICD-10-CM | POA: Insufficient documentation

## 2014-05-05 DIAGNOSIS — Z888 Allergy status to other drugs, medicaments and biological substances status: Secondary | ICD-10-CM | POA: Insufficient documentation

## 2014-05-05 HISTORY — DX: Presence of spectacles and contact lenses: Z97.3

## 2014-05-05 HISTORY — PX: BREAST DUCTAL SYSTEM EXCISION: SHX5242

## 2014-05-05 SURGERY — EXCISION DUCTAL SYSTEM BREAST
Anesthesia: General | Site: Breast | Laterality: Right

## 2014-05-05 MED ORDER — PROPOFOL 10 MG/ML IV BOLUS
INTRAVENOUS | Status: DC | PRN
  Administered 2014-05-05: 150 mg via INTRAVENOUS

## 2014-05-05 MED ORDER — HYDROCODONE-ACETAMINOPHEN 5-325 MG PO TABS: 1.0000 | ORAL_TABLET | ORAL | Status: DC | PRN

## 2014-05-05 MED ORDER — BUPIVACAINE HCL (PF) 0.25 % IJ SOLN
INTRAMUSCULAR | Status: DC | PRN
  Administered 2014-05-05: 10 mL

## 2014-05-05 MED ORDER — LIDOCAINE HCL (CARDIAC) 20 MG/ML IV SOLN
INTRAVENOUS | Status: DC | PRN
  Administered 2014-05-05: 50 mg via INTRAVENOUS

## 2014-05-05 MED ORDER — BUPIVACAINE HCL (PF) 0.25 % IJ SOLN
INTRAMUSCULAR | Status: AC
  Filled 2014-05-05: qty 30

## 2014-05-05 MED ORDER — FENTANYL CITRATE 0.05 MG/ML IJ SOLN
INTRAMUSCULAR | Status: DC | PRN
  Administered 2014-05-05: 100 ug via INTRAVENOUS

## 2014-05-05 MED ORDER — FENTANYL CITRATE 0.05 MG/ML IJ SOLN
INTRAMUSCULAR | Status: AC
  Filled 2014-05-05: qty 4

## 2014-05-05 MED ORDER — LACTATED RINGERS IV SOLN
INTRAVENOUS | Status: DC
  Administered 2014-05-05 (×2): via INTRAVENOUS

## 2014-05-05 MED ORDER — MIDAZOLAM HCL 2 MG/2ML IJ SOLN
INTRAMUSCULAR | Status: AC
  Filled 2014-05-05: qty 2

## 2014-05-05 MED ORDER — FENTANYL CITRATE 0.05 MG/ML IJ SOLN: 50.0000 ug | INTRAMUSCULAR | Status: DC | PRN

## 2014-05-05 MED ORDER — MIDAZOLAM HCL 2 MG/2ML IJ SOLN: 1.0000 mg | INTRAMUSCULAR | Status: DC | PRN

## 2014-05-05 MED ORDER — DEXAMETHASONE SODIUM PHOSPHATE 4 MG/ML IJ SOLN
INTRAMUSCULAR | Status: DC | PRN
  Administered 2014-05-05: 10 mg via INTRAVENOUS

## 2014-05-05 MED ORDER — MIDAZOLAM HCL 5 MG/5ML IJ SOLN
INTRAMUSCULAR | Status: DC | PRN
  Administered 2014-05-05: 2 mg via INTRAVENOUS

## 2014-05-05 SURGICAL SUPPLY — 54 items
APPLIER CLIP 9.375 MED OPEN (MISCELLANEOUS)
BENZOIN TINCTURE PRP APPL 2/3 (GAUZE/BANDAGES/DRESSINGS) IMPLANT
BINDER BREAST LRG (GAUZE/BANDAGES/DRESSINGS) ×2 IMPLANT
BINDER BREAST MEDIUM (GAUZE/BANDAGES/DRESSINGS) IMPLANT
BINDER BREAST XLRG (GAUZE/BANDAGES/DRESSINGS) IMPLANT
BINDER BREAST XXLRG (GAUZE/BANDAGES/DRESSINGS) IMPLANT
BLADE SURG 15 STRL LF DISP TIS (BLADE) ×1 IMPLANT
BLADE SURG 15 STRL SS (BLADE) ×1
CANISTER SUCT 1200ML W/VALVE (MISCELLANEOUS) IMPLANT
CHLORAPREP W/TINT 26ML (MISCELLANEOUS) ×2 IMPLANT
CLIP APPLIE 9.375 MED OPEN (MISCELLANEOUS) IMPLANT
COVER BACK TABLE 60X90IN (DRAPES) ×2 IMPLANT
COVER MAYO STAND STRL (DRAPES) ×2 IMPLANT
DECANTER SPIKE VIAL GLASS SM (MISCELLANEOUS) ×2 IMPLANT
DEVICE DUBIN W/COMP PLATE 8390 (MISCELLANEOUS) IMPLANT
DRAPE LAPAROTOMY 100X72 PEDS (DRAPES) ×2 IMPLANT
DRSG TEGADERM 4X4.75 (GAUZE/BANDAGES/DRESSINGS) ×2 IMPLANT
ELECT COATED BLADE 2.86 ST (ELECTRODE) ×2 IMPLANT
ELECT REM PT RETURN 9FT ADLT (ELECTROSURGICAL) ×2
ELECTRODE REM PT RTRN 9FT ADLT (ELECTROSURGICAL) ×1 IMPLANT
GLOVE BIO SURGEON STRL SZ7 (GLOVE) ×2 IMPLANT
GLOVE BIOGEL PI IND STRL 7.0 (GLOVE) ×2 IMPLANT
GLOVE BIOGEL PI IND STRL 7.5 (GLOVE) ×1 IMPLANT
GLOVE BIOGEL PI IND STRL 8 (GLOVE) ×1 IMPLANT
GLOVE BIOGEL PI INDICATOR 7.0 (GLOVE) ×2
GLOVE BIOGEL PI INDICATOR 7.5 (GLOVE) ×1
GLOVE BIOGEL PI INDICATOR 8 (GLOVE) ×1
GLOVE ECLIPSE 7.0 STRL STRAW (GLOVE) ×2 IMPLANT
GLOVE SURG SS PI 8.0 STRL IVOR (GLOVE) ×2 IMPLANT
GOWN STRL REUS W/ TWL LRG LVL3 (GOWN DISPOSABLE) ×4 IMPLANT
GOWN STRL REUS W/TWL LRG LVL3 (GOWN DISPOSABLE) ×4
LIQUID BAND (GAUZE/BANDAGES/DRESSINGS) IMPLANT
MARKER SKIN DUAL TIP RULER LAB (MISCELLANEOUS) ×2 IMPLANT
NEEDLE HYPO 25X1 1.5 SAFETY (NEEDLE) ×2 IMPLANT
NS IRRIG 1000ML POUR BTL (IV SOLUTION) IMPLANT
PACK BASIN DAY SURGERY FS (CUSTOM PROCEDURE TRAY) ×2 IMPLANT
PENCIL BUTTON HOLSTER BLD 10FT (ELECTRODE) ×2 IMPLANT
SLEEVE SCD COMPRESS KNEE MED (MISCELLANEOUS) ×2 IMPLANT
SPONGE GAUZE 4X4 12PLY STER LF (GAUZE/BANDAGES/DRESSINGS) ×2 IMPLANT
SPONGE LAP 4X18 X RAY DECT (DISPOSABLE) ×2 IMPLANT
STRIP CLOSURE SKIN 1/2X4 (GAUZE/BANDAGES/DRESSINGS) IMPLANT
SUT MNCRL AB 4-0 PS2 18 (SUTURE) IMPLANT
SUT MON AB 5-0 PS2 18 (SUTURE) ×2 IMPLANT
SUT SILK 2 0 SH (SUTURE) ×2 IMPLANT
SUT VIC AB 2-0 SH 27 (SUTURE) ×1
SUT VIC AB 2-0 SH 27XBRD (SUTURE) ×1 IMPLANT
SUT VIC AB 3-0 SH 27 (SUTURE) ×1
SUT VIC AB 3-0 SH 27X BRD (SUTURE) ×1 IMPLANT
SUT VIC AB 5-0 PS2 18 (SUTURE) IMPLANT
SYR CONTROL 10ML LL (SYRINGE) ×2 IMPLANT
TOWEL OR 17X24 6PK STRL BLUE (TOWEL DISPOSABLE) ×2 IMPLANT
TOWEL OR NON WOVEN STRL DISP B (DISPOSABLE) ×2 IMPLANT
TUBE CONNECTING 20X1/4 (TUBING) IMPLANT
YANKAUER SUCT BULB TIP NO VENT (SUCTIONS) IMPLANT

## 2014-05-05 NOTE — Transfer of Care (Signed)
Immediate Anesthesia Transfer of Care Note  Patient: Crystal Russell  Procedure(s) Performed: Procedure(s): RIGHT BREAST DUCT EXCISION (Right)  Patient Location: PACU  Anesthesia Type:General  Level of Consciousness: sedated  Airway & Oxygen Therapy: Patient Spontanous Breathing and Patient connected to T-piece oxygen  Post-op Assessment: Report given to PACU RN and Post -op Vital signs reviewed and stable  Post vital signs: Reviewed and stable  Last Vitals:  Filed Vitals:   05/05/14 0742  BP: 117/67  Pulse: 46  Temp: 36.4 C  Resp: 16    Complications: No apparent anesthesia complications

## 2014-05-05 NOTE — Discharge Instructions (Signed)
[facility] °Office Phone Number [phone] ° °BREAST BIOPSY/ PARTIAL MASTECTOMY: POST OP INSTRUCTIONS ° °Always review your discharge instruction sheet given to you by the facility where your surgery was performed. ° °IF YOU HAVE DISABILITY OR FAMILY LEAVE FORMS, YOU MUST BRING THEM TO THE OFFICE FOR PROCESSING.  DO NOT GIVE THEM TO YOUR DOCTOR. ° °1. A prescription for pain medication may be given to you upon discharge.  Take your pain medication as prescribed, if needed.  If narcotic pain medicine is not needed, then you may take acetaminophen (Tylenol), naprosyn (Alleve) or ibuprofen (Advil) as needed. °2. Take your usually prescribed medications unless otherwise directed °3. If you need a refill on your pain medication, please contact your pharmacy.  They will contact our office to request authorization.  Prescriptions will not be filled after 5pm or on week-ends. °4. You should eat very light the first 24 hours after surgery, such as soup, crackers, pudding, etc.  Resume your normal diet the day after surgery. °5. Most patients will experience some swelling and bruising in the breast.  Ice packs and a good support bra will help.  Wear the breast binder provided or a sports bra for 72 hours day and night.  After that wear a sports bra during the day until you return to the office. Swelling and bruising can take several days to resolve.  °6. It is common to experience some constipation if taking pain medication after surgery.  Increasing fluid intake and taking a stool softener will usually help or prevent this problem from occurring.  A mild laxative (Milk of Magnesia or Miralax) should be taken according to package directions if there are no bowel movements after 48 hours. °7. Unless discharge instructions indicate otherwise, you may remove your bandages 48 hours after surgery and you may shower at that time.  You may have steri-strips (small skin tapes) in place directly over the incision.   These strips should be left on the skin for 7-10 days and will come off on their own.  If your surgeon used skin glue on the incision, you may shower in 24 hours.  The glue will flake off over the next 2-3 weeks.  Any sutures or staples will be removed at the office during your follow-up visit. °8. ACTIVITIES:  You may resume regular daily activities (gradually increasing) beginning the next day.  Wearing a good support bra or sports bra minimizes pain and swelling.  You may have sexual intercourse when it is comfortable. °a. You may drive when you no longer are taking prescription pain medication, you can comfortably wear a seatbelt, and you can safely maneuver your car and apply brakes. °b. RETURN TO WORK:  ______________________________________________________________________________________ °9. You should see your doctor in the office for a follow-up appointment approximately two weeks after your surgery.  Your doctor’s nurse will typically make your follow-up appointment when she calls you with your pathology report.  Expect your pathology report 3-4 business days after your surgery.  You may call to check if you do not hear from us after three days. °10. OTHER INSTRUCTIONS: _______________________________________________________________________________________________ _____________________________________________________________________________________________________________________________________ °_____________________________________________________________________________________________________________________________________ °_____________________________________________________________________________________________________________________________________ ° °WHEN TO CALL DR WAKEFIELD: °1. Fever over 101.0 °2. Nausea and/or vomiting. °3. Extreme swelling or bruising. °4. Continued bleeding from incision. °5. Increased pain, redness, or drainage from the incision. ° °The clinic staff is available to  answer your questions during regular business hours.  Please don’t hesitate to call and ask to speak to one of the nurses for   clinical concerns.  If you have a medical emergency, go to the nearest emergency room or call 911.  A surgeon from [facility] is always on call at the hospital. ° °For further questions, please visit [email or web address] ° °Post Anesthesia Home Care Instructions ° °Activity: °Get plenty of rest for the remainder of the day. A responsible adult should stay with you for 24 hours following the procedure.  °For the next 24 hours, DO NOT: °-Drive a car °-Operate machinery °-Drink alcoholic beverages °-Take any medication unless instructed by your physician °-Make any legal decisions or sign important papers. ° °Meals: °Start with liquid foods such as gelatin or soup. Progress to regular foods as tolerated. Avoid greasy, spicy, heavy foods. If nausea and/or vomiting occur, drink only clear liquids until the nausea and/or vomiting subsides. Call your physician if vomiting continues. ° °Special Instructions/Symptoms: °Your throat may feel dry or sore from the anesthesia or the breathing tube placed in your throat during surgery. If this causes discomfort, gargle with warm salt water. The discomfort should disappear within 24 hours. ° °

## 2014-05-05 NOTE — H&P (Signed)
64 yof who presents with bloody nipple discharge since August. She developed right breast pain and palpated this and expressed discharge. This was bloody. This has remained bloody since then, unilateral and only expressed. She has undergone negative mm and [place] that I have today. She was seen in [place] by a surgeon and I have a letter from him. She underwent a biopsy of the left side radiologically previously that was benign. 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 She had an MRI that shows no right breast abnormality. The left breast shows the biopsy proven benign lesion as well as a 6 mm probably benign mass in the left breast at 6 oclock. recommended 6 month follow up. cytology on discharge from there was negative. She still reports the discharge and comes in today to discuss this. She has no fh of breast or ovarian cancer.   Other Problems Marjean Donna, CMA; 04/14/2014 3:58 PM) Anxiety Disorder Atrial Fibrillation Heart murmur Hypercholesterolemia Thyroid Disease Umbilical Hernia Repair  Diagnostic Studies History Marjean Donna, CMA; 04/14/2014 3:58 PM) Colonoscopy 5-10 years ago Mammogram within last year Pap Smear 1-5 years ago  Allergies Marjean Donna, CMA; 04/14/2014 3:59 PM) Crestor *ANTIHYPERLIPIDEMICS* Penicillamine *ASSORTED CLASSES* Pravastatin Sodium *ANTIHYPERLIPIDEMICS*  Medication History (Sonya Bynum, CMA; 04/14/2014 4:02 PM) Levothyroxine Sodium (88MCG Tablet, Oral) Active. Aspirin EC (81MG  Tablet DR, Oral) Active. Vitamin D (400UNIT Tablet, Oral) Active. Premarin (0.625MG /GM Cream, Vaginal as needed) Active. Livalo (2MG  Tablet, Oral) Active. Magnesium Citrate (1.745GM/30ML Solution, Oral) Active. Multivitamins (Oral) Active. Bactroban (2% Cream, External) Active. Vitamin E Complex (1000UNIT Capsule, Oral) Active. Omega 3 (1000MG  Capsule, Oral) Active.  Social History ([place]; 04/14/2014 3:58 PM) Alcohol use Occasional alcohol  use. Caffeine use Coffee. No drug use Tobacco use Former smoker.  Family History Marjean Donna, [place]; 04/14/2014 3:58 PM) Cancer Father, Mother. Ischemic Bowel Disease Son. Respiratory Condition Father, Mother.  Pregnancy / Birth History Marjean Donna, [place]; 04/14/2014 3:58 PM) Age at menarche 25 years. Age of menopause 15-60 Gravida 3 Maternal age 75-30 Para 3  Review of Systems ([place]; 04/14/2014 3:58 PM) General Not Present- Appetite Loss, Chills, Fatigue, Fever, Night Sweats, Weight Gain and Weight Loss. Skin Not Present- Change in Wart/Mole, Dryness, Hives, Jaundice, New Lesions, Non-Healing Wounds, Rash and Ulcer. HEENT Not Present- Earache, Hearing Loss, Hoarseness, Nose Bleed, Oral Ulcers, Ringing in the Ears, Seasonal Allergies, Sinus Pain, Sore Throat, Visual Disturbances, Wears glasses/contact lenses and Yellow Eyes. Respiratory Not Present- Bloody sputum, Chronic Cough, Difficulty Breathing, Snoring and Wheezing. Breast Present- Nipple Discharge. Not Present- Breast Mass, Breast Pain and Skin Changes. Cardiovascular Not Present- Chest Pain, Difficulty Breathing Lying Down, Leg Cramps, Palpitations, Rapid Heart Rate, Shortness of Breath and Swelling of Extremities. Gastrointestinal Not Present- Abdominal Pain, Bloating, Bloody Stool, Change in Bowel Habits, Chronic diarrhea, Constipation, Difficulty Swallowing, Excessive gas, Gets full quickly at meals, Hemorrhoids, Indigestion, Nausea, Rectal Pain and Vomiting. Musculoskeletal Not Present- Back Pain, Joint Pain, Joint Stiffness, Muscle Pain, Muscle Weakness and Swelling of Extremities. Neurological Not Present- Decreased Memory, Fainting, Headaches, Numbness, Seizures, Tingling, Tremor, Trouble walking and Weakness. Psychiatric Not Present- Anxiety, Bipolar, Change in Sleep Pattern, Depression, Fearful and Frequent crying. Endocrine Not Present- Cold Intolerance, Excessive Hunger, Hair Changes, Heat Intolerance, Hot  flashes and New Diabetes. Hematology Not Present- Easy Bruising, Excessive bleeding, Gland problems, HIV and Persistent Infections.   Vitals (Sonya Bynum CMA; 04/14/2014 3:58 PM) 04/14/2014 3:58 PM Weight: 145 lb Height: 65in Body Surface Area: 1.74 m Body Mass Index: 24.13 kg/m  Temp.: 97.91F(Temporal)  Pulse: 79 (Regular)  BP: 118/82 (Sitting, Left Arm, Standard)    Physical Exam Rolm Bookbinder MD; 04/15/2014 9:14 AM) General Mental Status-Alert. Orientation-Oriented X3.  Chest and Lung Exam Chest and lung exam reveals -on auscultation, normal breath sounds, no adventitious sounds and normal vocal resonance.  Breast Nipples Discharge - Left - None. Right - Bloody. Note: expressed bloody discharge from 9 oclock position on the right nac. Breast Lump-No Palpable Breast Mass.  Cardiovascular Cardiovascular examination reveals -normal heart sounds, regular rate and rhythm with no murmurs.  Lymphatic Head & Neck  General Head & Neck Lymphatics: Bilateral - Description - Normal. Axillary  General Axillary Region: Bilateral - Description - Normal. Note: no [place] adenopathy     Assessment & Plan Rolm Bookbinder MD; 04/15/2014 9:15 AM) Crystal Russell DISCHARGE FROM RIGHT NIPPLE (611.79  N64.52) Impression: I recommended excision of this ductal system to ensure no abnormality although likely benign given nl imaging and exam. We discussed doing this as outpt surgery, recovery and risks. will proceed soon     Signed by Rolm Bookbinder, MD (04/15/2014 9:16 AM)

## 2014-05-05 NOTE — Interval H&P Note (Signed)
History and Physical Interval Note:  05/05/2014 8:23 AM  Crystal Russell  has presented today for surgery, with the diagnosis of right breast bloody nipple discharge  The various methods of treatment have been discussed with the patient and family. After consideration of risks, benefits and other options for treatment, the patient has consented to  Procedure(s): RIGHT BREAST DUCT EXCISION (Right) as a surgical intervention .  The patient's history has been reviewed, patient examined, no change in status, stable for surgery.  I have reviewed the patient's chart and labs.  Questions were answered to the patient's satisfaction.     [name]

## 2014-05-05 NOTE — Anesthesia Postprocedure Evaluation (Signed)
Anesthesia Post Note  Patient: Crystal Russell  Procedure(s) Performed: Procedure(s) (LRB): RIGHT BREAST DUCT EXCISION (Right)  Anesthesia type: general  Patient location: PACU  Post pain: Pain level controlled  Post assessment: Patient's Cardiovascular Status Stable  Last Vitals:  Filed Vitals:   05/05/14 1015  BP: 117/60  Pulse: 48  Temp:   Resp: 13    Post vital signs: Reviewed and stable  Level of consciousness: sedated  Complications: No apparent anesthesia complications

## 2014-05-05 NOTE — Anesthesia Preprocedure Evaluation (Addendum)
Anesthesia Evaluation  Patient identified by MRN, date of birth, ID band Patient awake    Reviewed: Allergy & Precautions, H&P , NPO status , Patient's Chart, lab work & pertinent test results  Airway        Dental   Pulmonary neg pulmonary ROS,  breath sounds clear to auscultation        Cardiovascular + Peripheral Vascular Disease Atrial Fibrillation Rhythm:regular Rate:Normal  Echo 05/2010 nl EF   Neuro/Psych PSYCHIATRIC DISORDERS Anxiety Depression negative neurological ROS     GI/Hepatic negative GI ROS, Neg liver ROS,   Endo/Other  diabetes, Type 2Hypothyroidism   Renal/GU negative Renal ROS     Musculoskeletal   Abdominal   Peds  Hematology   Anesthesia Other Findings   Reproductive/Obstetrics negative OB ROS                            Anesthesia Physical Anesthesia Plan  ASA: II  Anesthesia Plan:    Post-op Pain Management:    Induction:   Airway Management Planned:   Additional Equipment:   Intra-op Plan:   Post-operative Plan:   Informed Consent:   Plan Discussed with:   Anesthesia Plan Comments:         Anesthesia Quick Evaluation

## 2014-05-05 NOTE — Op Note (Signed)
Preoperative diagnosis: right breast bloody nipple discharge Postoperative diagnosis: same as above Procedure: right breast ductal excision Surgeon: Dr Serita Grammes EBL: minimal Anes: general with LMA Complications: None Specimens: Right breast marked with paint, right breast retroareolar tissue marked short stitch superior, long stitch lateral, double stitch deep Spongy corrected completion Disposition to recovery stable  Indications: This is a 64 year old female who has undergone an evaluation for right breast bloody nipple discharge. Her evaluation is negative but she has persistent discharge. She and I  discussed going to the operating room for a directed duct excision as I was able to identify the discharging duct.  Procedure: After informed consent was obtained the patient was taken to the operating room. She had sequential compression devices on her legs. She was then placed under general anesthesia with an LMA. Her right breast was prepped and draped in the standard sterile surgical fashion. A surgical timeout was then performed.   I identified the discharging duct in the 9:00 position. I cannulated this with a lacrimal duct probe. I then made a periareolar incision after injecting this area with Marcaine. I then proceeded to use cautery to excise the entire ductal system in that location. I identified  lacrimal duct probe to ensure that I was removing the correct ductal system. I then decided to remove some of the retroareolar tissue that was still there and sent this as a separate specimen. Hemostasis was obtained. I then closed this with 2-0 Vicryl, 3-0 Vicryl, and 5-0 Monocryl. Glue and Steri-Strips were applied. A breast binder was applied. She tolerated this well, was extubated, and was transferred to recovery in stable condition.

## 2014-05-06 ENCOUNTER — Encounter (HOSPITAL_BASED_OUTPATIENT_CLINIC_OR_DEPARTMENT_OTHER): Payer: Self-pay | Admitting: General Surgery

## 2014-05-16 ENCOUNTER — Other Ambulatory Visit (INDEPENDENT_AMBULATORY_CARE_PROVIDER_SITE_OTHER): Payer: Self-pay | Admitting: General Surgery

## 2014-05-16 DIAGNOSIS — N6320 Unspecified lump in the left breast, unspecified quadrant: Secondary | ICD-10-CM | POA: Insufficient documentation

## 2014-06-08 ENCOUNTER — Other Ambulatory Visit: Payer: Self-pay | Admitting: General Surgery

## 2014-06-08 ENCOUNTER — Ambulatory Visit
Admission: RE | Admit: 2014-06-08 | Discharge: 2014-06-08 | Disposition: A | Discharge status: Home / Self Care | Payer: 59 | Source: Ambulatory Visit | Attending: General Surgery | Admitting: General Surgery

## 2014-06-08 ENCOUNTER — Other Ambulatory Visit: Payer: 59

## 2014-06-08 DIAGNOSIS — Z1231 Encounter for screening mammogram for malignant neoplasm of breast: Secondary | ICD-10-CM

## 2014-06-08 DIAGNOSIS — N6320 Unspecified lump in the left breast, unspecified quadrant: Secondary | ICD-10-CM

## 2014-06-08 MED ORDER — GADOBENATE DIMEGLUMINE 529 MG/ML IV SOLN: 14.0000 mL | Freq: Once | INTRAVENOUS | Status: AC | PRN

## 2014-06-09 ENCOUNTER — Telehealth: Payer: Self-pay | Admitting: Family Medicine

## 2014-06-09 ENCOUNTER — Ambulatory Visit
Admission: RE | Admit: 2014-06-09 | Discharge: 2014-06-09 | Disposition: A | Discharge status: Home / Self Care | Payer: 59 | Source: Ambulatory Visit | Attending: General Surgery | Admitting: General Surgery

## 2014-06-09 DIAGNOSIS — Z1231 Encounter for screening mammogram for malignant neoplasm of breast: Secondary | ICD-10-CM

## 2014-06-09 NOTE — Telephone Encounter (Signed)
OK to refill Flonase 2 sprays each nostril qd prn allergies, disp #1 with 5 rf

## 2014-06-09 NOTE — Telephone Encounter (Signed)
Refill request for Fluticasone prop 50 mcg spray not on current list. Last OV 08/05/13 ADVISE on REFILL

## 2014-06-10 MED ORDER — FLUTICASONE PROPIONATE 50 MCG/ACT NA SUSP: 2.0000 | Freq: Every day | NASAL | Status: DC

## 2014-06-10 NOTE — Telephone Encounter (Signed)
Refill done as instructed 

## 2014-06-17 ENCOUNTER — Other Ambulatory Visit: Payer: 59

## 2014-06-24 ENCOUNTER — Encounter: Payer: Self-pay | Admitting: Family Medicine

## 2014-06-26 ENCOUNTER — Other Ambulatory Visit: Payer: Self-pay | Admitting: Family Medicine

## 2014-06-26 DIAGNOSIS — R109 Unspecified abdominal pain: Secondary | ICD-10-CM

## 2014-07-05 ENCOUNTER — Other Ambulatory Visit: Payer: Self-pay | Admitting: Family Medicine

## 2014-07-19 ENCOUNTER — Telehealth: Payer: Self-pay | Admitting: *Deleted

## 2014-07-19 NOTE — Telephone Encounter (Signed)
Prior auth for Livalo initiated. Awaiting determination. JG//CMA

## 2014-07-25 ENCOUNTER — Encounter: Payer: Self-pay | Admitting: Family Medicine

## 2014-08-02 ENCOUNTER — Other Ambulatory Visit: Payer: Self-pay | Admitting: Family Medicine

## 2014-08-02 MED ORDER — ATORVASTATIN CALCIUM 10 MG PO TABS: ORAL_TABLET | ORAL | Status: DC

## 2014-08-04 ENCOUNTER — Other Ambulatory Visit: Payer: Self-pay | Admitting: Family Medicine

## 2014-08-04 ENCOUNTER — Telehealth: Payer: Self-pay | Admitting: Family Medicine

## 2014-08-04 ENCOUNTER — Encounter: Payer: Self-pay | Admitting: Family Medicine

## 2014-08-04 DIAGNOSIS — E038 Other specified hypothyroidism: Secondary | ICD-10-CM

## 2014-08-04 DIAGNOSIS — E119 Type 2 diabetes mellitus without complications: Secondary | ICD-10-CM

## 2014-08-04 DIAGNOSIS — E785 Hyperlipidemia, unspecified: Secondary | ICD-10-CM

## 2014-08-04 DIAGNOSIS — I4891 Unspecified atrial fibrillation: Secondary | ICD-10-CM

## 2014-08-04 NOTE — Telephone Encounter (Signed)
Spoke to patient this morning already regarding results and instructions.

## 2014-08-04 NOTE — Telephone Encounter (Signed)
Relation to pt: self  Call back number: [phone]    Reason for call:  -Pt wanted to verify that (LIPITOR) 10 MG tablet had to be cut in half. Advised pt exactly what RX stated Sig: Take 1/2 by mouth daily. Pt voiced understanding

## 2014-08-04 NOTE — Telephone Encounter (Signed)
Labs entered.

## 2014-08-10 NOTE — Telephone Encounter (Signed)
Med switched to Atorvastatin, please refer to mychart message dated 07/25/14

## 2014-10-26 ENCOUNTER — Other Ambulatory Visit (INDEPENDENT_AMBULATORY_CARE_PROVIDER_SITE_OTHER): Payer: 59

## 2014-10-26 DIAGNOSIS — E038 Other specified hypothyroidism: Secondary | ICD-10-CM | POA: Diagnosis not present

## 2014-10-26 DIAGNOSIS — I4891 Unspecified atrial fibrillation: Secondary | ICD-10-CM | POA: Diagnosis not present

## 2014-10-26 DIAGNOSIS — R109 Unspecified abdominal pain: Secondary | ICD-10-CM

## 2014-10-26 DIAGNOSIS — E785 Hyperlipidemia, unspecified: Secondary | ICD-10-CM | POA: Diagnosis not present

## 2014-10-26 DIAGNOSIS — E119 Type 2 diabetes mellitus without complications: Secondary | ICD-10-CM

## 2014-10-26 LAB — LIPID PANEL
CHOLESTEROL: 161 mg/dL (ref 0–200)
HDL: 60 mg/dL (ref >39.00)
LDL CALC: 90 mg/dL (ref 0–99)
NonHDL: 101
Total CHOL/HDL Ratio: 3
Triglycerides: 57 mg/dL (ref 0.0–149.0)
VLDL: 11.4 mg/dL (ref 0.0–40.0)

## 2014-10-26 LAB — URINALYSIS, ROUTINE W REFLEX MICROSCOPIC
Bilirubin Urine: NEGATIVE
HGB URINE DIPSTICK: NEGATIVE
Ketones, ur: NEGATIVE
NITRITE: NEGATIVE
PH: 7.5 (ref 5.0–8.0)
RBC / HPF: NONE SEEN (ref 0–?)
SPECIFIC GRAVITY, URINE: 1.01 (ref 1.000–1.030)
TOTAL PROTEIN, URINE-UPE24: NEGATIVE
URINE GLUCOSE: NEGATIVE
Urobilinogen, UA: 0.2 (ref 0.0–1.0)

## 2014-10-26 LAB — COMPREHENSIVE METABOLIC PANEL
ALBUMIN: 4 g/dL (ref 3.5–5.2)
ALT: 26 U/L (ref 0–35)
AST: 32 U/L (ref 0–37)
Alkaline Phosphatase: 58 U/L (ref 39–117)
BUN: 14 mg/dL (ref 6–23)
CO2: 32 meq/L (ref 19–32)
Calcium: 9.3 mg/dL (ref 8.4–10.5)
Chloride: 100 mEq/L (ref 96–112)
Creatinine, Ser: 0.68 mg/dL (ref 0.40–1.20)
GFR: 92.61 mL/min (ref >60.00)
Glucose, Bld: 90 mg/dL (ref 70–99)
POTASSIUM: 4.4 meq/L (ref 3.5–5.1)
Sodium: 135 mEq/L (ref 135–145)
Total Bilirubin: 1 mg/dL (ref 0.2–1.2)
Total Protein: 6.3 g/dL (ref 6.0–8.3)

## 2014-10-26 LAB — CBC WITH DIFFERENTIAL/PLATELET
Basophils Absolute: 0 10*3/uL (ref 0.0–0.1)
Basophils Relative: 0.8 % (ref 0.0–3.0)
Eosinophils Absolute: 0.2 10*3/uL (ref 0.0–0.7)
Eosinophils Relative: 4.1 % (ref 0.0–5.0)
HEMATOCRIT: 35.8 % — AB (ref 36.0–46.0)
Hemoglobin: 12.1 g/dL (ref 12.0–15.0)
Lymphocytes Relative: 35.1 % (ref 12.0–46.0)
Lymphs Abs: 1.6 10*3/uL (ref 0.7–4.0)
MCHC: 33.8 g/dL (ref 30.0–36.0)
MCV: 87 fl (ref 78.0–100.0)
Monocytes Absolute: 0.4 10*3/uL (ref 0.1–1.0)
Monocytes Relative: 8.2 % (ref 3.0–12.0)
NEUTROS ABS: 2.4 10*3/uL (ref 1.4–7.7)
NEUTROS PCT: 51.8 % (ref 43.0–77.0)
PLATELETS: 240 10*3/uL (ref 150.0–400.0)
RBC: 4.11 Mil/uL (ref 3.87–5.11)
RDW: 12.9 % (ref 11.5–15.5)
WBC: 4.6 10*3/uL (ref 4.0–10.5)

## 2014-10-26 LAB — TSH: TSH: 3.29 u[IU]/mL (ref 0.35–4.50)

## 2014-10-26 LAB — HEMOGLOBIN A1C: Hgb A1c MFr Bld: 5.5 % (ref 4.6–6.5)

## 2014-10-27 ENCOUNTER — Encounter: Payer: Self-pay | Admitting: Family Medicine

## 2014-10-28 LAB — URINE CULTURE: Colony Count: 9000

## 2014-11-01 ENCOUNTER — Encounter: Payer: Self-pay | Admitting: Family Medicine

## 2014-11-08 ENCOUNTER — Other Ambulatory Visit: Payer: Self-pay | Admitting: Family Medicine

## 2014-11-21 ENCOUNTER — Encounter: Payer: Self-pay | Admitting: Family Medicine

## 2014-11-21 ENCOUNTER — Ambulatory Visit (INDEPENDENT_AMBULATORY_CARE_PROVIDER_SITE_OTHER): Payer: 59 | Admitting: Family Medicine

## 2014-11-21 VITALS — BP 104/62 | HR 77 | Temp 97.8°F | Ht 65.0 in | Wt 145.0 lb

## 2014-11-21 DIAGNOSIS — E782 Mixed hyperlipidemia: Secondary | ICD-10-CM

## 2014-11-21 DIAGNOSIS — E039 Hypothyroidism, unspecified: Secondary | ICD-10-CM | POA: Diagnosis not present

## 2014-11-21 DIAGNOSIS — L578 Other skin changes due to chronic exposure to nonionizing radiation: Secondary | ICD-10-CM | POA: Diagnosis not present

## 2014-11-21 DIAGNOSIS — Z1211 Encounter for screening for malignant neoplasm of colon: Secondary | ICD-10-CM | POA: Diagnosis not present

## 2014-11-21 DIAGNOSIS — K5900 Constipation, unspecified: Secondary | ICD-10-CM | POA: Diagnosis not present

## 2014-11-21 DIAGNOSIS — I480 Paroxysmal atrial fibrillation: Secondary | ICD-10-CM

## 2014-11-21 DIAGNOSIS — Z0000 Encounter for general adult medical examination without abnormal findings: Secondary | ICD-10-CM | POA: Diagnosis not present

## 2014-11-21 DIAGNOSIS — Z8632 Personal history of gestational diabetes: Secondary | ICD-10-CM

## 2014-11-21 MED ORDER — LEVOTHYROXINE SODIUM 25 MCG PO TABS: ORAL_TABLET | ORAL | Status: DC

## 2014-11-21 MED ORDER — LEVOTHYROXINE SODIUM 88 MCG PO TABS: ORAL_TABLET | ORAL | Status: DC

## 2014-11-21 NOTE — Patient Instructions (Signed)
[employer] 10 strain probiotics daily  Encouraged increased hydration and fiber in diet. Daily probiotics. If bowels not moving can use MOM 2 tbls po in 4 oz of warm prune juice by mouth every 2-3 days. If no results then repeat in 4 hours with  Dulcolax suppository pr, may repeat again in 4 more hours as needed. Seek care if symptoms worsen. Consider daily Miralax and/or Dulcolax if symptoms persist.   Call if bowels still not moving and we can consider [place] for Adults A healthy lifestyle and preventive care can promote health and wellness. Preventive health guidelines for women include the following key practices.  A routine yearly physical is a good way to check with your health care provider about your health and preventive screening. It is a chance to share any concerns and updates on your health and to receive a thorough exam.  Visit your dentist for a routine exam and preventive care every 6 months. Brush your teeth twice a day and floss once a day. Good oral hygiene prevents tooth decay and gum disease.  The frequency of eye exams is based on your age, health, family medical history, use of contact lenses, and other factors. Follow your health care provider's recommendations for frequency of eye exams.  Eat a healthy diet. Foods like vegetables, fruits, whole grains, low-fat dairy products, and lean protein foods contain the nutrients you need without too many calories. Decrease your intake of foods high in solid fats, added sugars, and salt. Eat the right amount of calories for you.Get information about a proper diet from your health care provider, if necessary.  Regular physical exercise is one of the most important things you can do for your health. Most adults should get at least 150 minutes of moderate-intensity exercise (any activity that increases your heart rate and causes you to sweat) each week. In addition, most adults need muscle-strengthening exercises on  2 or more days a week.  Maintain a healthy weight. The body mass index (BMI) is a screening tool to identify possible weight problems. It provides an estimate of body fat based on height and weight. Your health care provider can find your BMI and can help you achieve or maintain a healthy weight.For adults 20 years and older:  A BMI below 18.5 is considered underweight.  A BMI of 18.5 to 24.9 is normal.  A BMI of 25 to 29.9 is considered overweight.  A BMI of 30 and above is considered obese.  Maintain normal blood lipids and cholesterol levels by exercising and minimizing your intake of saturated fat. Eat a balanced diet with plenty of fruit and vegetables. Blood tests for lipids and cholesterol should begin at age 55 and be repeated every 5 years. If your lipid or cholesterol levels are high, you are over 50, or you are at high risk for heart disease, you may need your cholesterol levels checked more frequently.Ongoing high lipid and cholesterol levels should be treated with medicines if diet and exercise are not working.  If you smoke, find out from your health care provider how to quit. If you do not use tobacco, do not start.  Lung cancer screening is recommended for adults aged 72-80 years who are at high risk for developing lung cancer because of a history of smoking. A yearly low-dose CT scan of the lungs is recommended for people who have at least a 30-pack-year history of smoking and are a current smoker or have quit within the  past 15 years. A pack year of smoking is smoking an average of 1 pack of cigarettes a day for 1 year (for example: 1 pack a day for 30 years or 2 packs a day for 15 years). Yearly screening should continue until the smoker has stopped smoking for at least 15 years. Yearly screening should be stopped for people who develop a health problem that would prevent them from having lung cancer treatment.  If you are pregnant, do not drink alcohol. If you are  breastfeeding, be very cautious about drinking alcohol. If you are not pregnant and choose to drink alcohol, do not have more than 1 drink per day. One drink is considered to be 12 ounces (355 mL) of beer, 5 ounces (148 mL) of wine, or 1.5 ounces (44 mL) of liquor.  Avoid use of street drugs. Do not share needles with anyone. Ask for help if you need support or instructions about stopping the use of drugs.  High blood pressure causes heart disease and increases the risk of stroke. Your blood pressure should be checked at least every 1 to 2 years. Ongoing high blood pressure should be treated with medicines if weight loss and exercise do not work.  If you are 20-37 years old, ask your health care provider if you should take aspirin to prevent strokes.  Diabetes screening involves taking a blood sample to check your fasting blood sugar level. This should be done once every 3 years, after age 20, if you are within normal weight and without risk factors for diabetes. Testing should be considered at a younger age or be carried out more frequently if you are overweight and have at least 1 risk factor for diabetes.  Breast cancer screening is essential preventive care for women. You should practice "breast self-awareness." This means understanding the normal appearance and feel of your breasts and may include breast self-examination. Any changes detected, no matter how small, should be reported to a health care provider. Women in their 80s and 30s should have a clinical breast exam (CBE) by a health care provider as part of a regular health exam every 1 to 3 years. After age 84, women should have a CBE every year. Starting at age 32, women should consider having a mammogram (breast X-ray test) every year. Women who have a family history of breast cancer should talk to their health care provider about genetic screening. Women at a high risk of breast cancer should talk to their health care providers about having  an MRI and a mammogram every year.  Breast cancer gene (BRCA)-related cancer risk assessment is recommended for women who have family members with BRCA-related cancers. BRCA-related cancers include breast, ovarian, tubal, and peritoneal cancers. Having family members with these cancers may be associated with an increased risk for harmful changes (mutations) in the breast cancer genes BRCA1 and BRCA2. Results of the assessment will determine the need for genetic counseling and BRCA1 and BRCA2 testing.  Routine pelvic exams to screen for cancer are no longer recommended for nonpregnant women who are considered low risk for cancer of the pelvic organs (ovaries, uterus, and vagina) and who do not have symptoms. Ask your health care provider if a screening pelvic exam is right for you.  If you have had past treatment for cervical cancer or a condition that could lead to cancer, you need Pap tests and screening for cancer for at least 20 years after your treatment. If Pap tests have been discontinued, your risk  factors (such as having a new sexual partner) need to be reassessed to determine if screening should be resumed. Some women have medical problems that increase the chance of getting cervical cancer. In these cases, your health care provider may recommend more frequent screening and Pap tests.  The HPV test is an additional test that may be used for cervical cancer screening. The HPV test looks for the virus that can cause the cell changes on the cervix. The cells collected during the Pap test can be tested for HPV. The HPV test could be used to screen women aged 38 years and older, and should be used in women of any age who have unclear Pap test results. After the age of 68, women should have HPV testing at the same frequency as a Pap test.  Colorectal cancer can be detected and often prevented. Most routine colorectal cancer screening begins at the age of 18 years and continues through age 29 years.  However, your health care provider may recommend screening at an earlier age if you have risk factors for colon cancer. On a yearly basis, your health care provider may provide home test kits to check for hidden blood in the stool. Use of a small camera at the end of a tube, to directly examine the colon (sigmoidoscopy or colonoscopy), can detect the earliest forms of colorectal cancer. Talk to your health care provider about this at age 83, when routine screening begins. Direct exam of the colon should be repeated every 5-10 years through age 58 years, unless early forms of pre-cancerous polyps or small growths are found.  People who are at an increased risk for hepatitis B should be screened for this virus. You are considered at high risk for hepatitis B if:  You were born in a country where hepatitis B occurs often. Talk with your health care provider about which countries are considered high risk.  Your parents were born in a high-risk country and you have not received a shot to protect against hepatitis B (hepatitis B vaccine).  You have HIV or AIDS.  You use needles to inject street drugs.  You live with, or have sex with, someone who has hepatitis B.  You get hemodialysis treatment.  You take certain medicines for conditions like cancer, organ transplantation, and autoimmune conditions.  Hepatitis C blood testing is recommended for all people born from [birth year] through 1965 and any individual with known risks for hepatitis C.  Practice safe sex. Use condoms and avoid high-risk sexual practices to reduce the spread of sexually transmitted infections (STIs). STIs include gonorrhea, chlamydia, syphilis, trichomonas, herpes, HPV, and human immunodeficiency virus (HIV). Herpes, HIV, and HPV are viral illnesses that have no cure. They can result in disability, cancer, and death.  You should be screened for sexually transmitted illnesses (STIs) including gonorrhea and chlamydia if:  You are  sexually active and are younger than 24 years.  You are older than 24 years and your health care provider tells you that you are at risk for this type of infection.  Your sexual activity has changed since you were last screened and you are at an increased risk for chlamydia or gonorrhea. Ask your health care provider if you are at risk.  If you are at risk of being infected with HIV, it is recommended that you take a prescription medicine daily to prevent HIV infection. This is called preexposure prophylaxis (PrEP). You are considered at risk if:  You are a heterosexual woman, are  sexually active, and are at increased risk for HIV infection.  You take drugs by injection.  You are sexually active with a partner who has HIV.  Talk with your health care provider about whether you are at high risk of being infected with HIV. If you choose to begin PrEP, you should first be tested for HIV. You should then be tested every 3 months for as long as you are taking PrEP.  Osteoporosis is a disease in which the bones lose minerals and strength with aging. This can result in serious bone fractures or breaks. The risk of osteoporosis can be identified using a bone density scan. Women ages 44 years and over and women at risk for fractures or osteoporosis should discuss screening with their health care providers. Ask your health care provider whether you should take a calcium supplement or vitamin D to reduce the rate of osteoporosis.  Menopause can be associated with physical symptoms and risks. Hormone replacement therapy is available to decrease symptoms and risks. You should talk to your health care provider about whether hormone replacement therapy is right for you.  Use sunscreen. Apply sunscreen liberally and repeatedly throughout the day. You should seek shade when your shadow is shorter than you. Protect yourself by wearing long sleeves, pants, a wide-brimmed hat, and sunglasses year round, whenever you  are outdoors.  Once a month, do a whole body skin exam, using a mirror to look at the skin on your back. Tell your health care provider of new moles, moles that have irregular borders, moles that are larger than a pencil eraser, or moles that have changed in shape or color.  Stay current with required vaccines (immunizations).  Influenza vaccine. All adults should be immunized every year.  Tetanus, diphtheria, and acellular pertussis (Td, Tdap) vaccine. Pregnant women should receive 1 dose of Tdap vaccine during each pregnancy. The dose should be obtained regardless of the length of time since the last dose. Immunization is preferred during the 27th-36th week of gestation. An adult who has not previously received Tdap or who does not know her vaccine status should receive 1 dose of Tdap. This initial dose should be followed by tetanus and diphtheria toxoids (Td) booster doses every 10 years. Adults with an unknown or incomplete history of completing a 3-dose immunization series with Td-containing vaccines should begin or complete a primary immunization series including a Tdap dose. Adults should receive a Td booster every 10 years.  Varicella vaccine. An adult without evidence of immunity to varicella should receive 2 doses or a second dose if she has previously received 1 dose. Pregnant females who do not have evidence of immunity should receive the first dose after pregnancy. This first dose should be obtained before leaving the health care facility. The second dose should be obtained 4-8 weeks after the first dose.  Human papillomavirus (HPV) vaccine. Females aged 13-26 years who have not received the vaccine previously should obtain the 3-dose series. The vaccine is not recommended for use in pregnant females. However, pregnancy testing is not needed before receiving a dose. If a female is found to be pregnant after receiving a dose, no treatment is needed. In that case, the remaining doses should be  delayed until after the pregnancy. Immunization is recommended for any person with an immunocompromised condition through the age of 10 years if she did not get any or all doses earlier. During the 3-dose series, the second dose should be obtained 4-8 weeks after the first dose.  The third dose should be obtained 24 weeks after the first dose and 16 weeks after the second dose.  Zoster vaccine. One dose is recommended for adults aged 37 years or older unless certain conditions are present.  Measles, mumps, and rubella (MMR) vaccine. Adults born before [birth year] generally are considered immune to measles and mumps. Adults born in [birth year] or later should have 1 or more doses of MMR vaccine unless there is a contraindication to the vaccine or there is laboratory evidence of immunity to each of the three diseases. A routine second dose of MMR vaccine should be obtained at least 28 days after the first dose for students attending postsecondary schools, health care workers, or international travelers. People who received inactivated measles vaccine or an unknown type of measles vaccine during 1963-1967 should receive 2 doses of MMR vaccine. People who received inactivated mumps vaccine or an unknown type of mumps vaccine before 1979 and are at high risk for mumps infection should consider immunization with 2 doses of MMR vaccine. For females of childbearing age, rubella immunity should be determined. If there is no evidence of immunity, females who are not pregnant should be vaccinated. If there is no evidence of immunity, females who are pregnant should delay immunization until after pregnancy. Unvaccinated health care workers born before [birth year] who lack laboratory evidence of measles, mumps, or rubella immunity or laboratory confirmation of disease should consider measles and mumps immunization with 2 doses of MMR vaccine or rubella immunization with 1 dose of MMR vaccine.  Pneumococcal 13-valent conjugate (PCV13) vaccine.  When indicated, a person who is uncertain of her immunization history and has no record of immunization should receive the PCV13 vaccine. An adult aged 89 years or older who has certain medical conditions and has not been previously immunized should receive 1 dose of PCV13 vaccine. This PCV13 should be followed with a dose of pneumococcal polysaccharide (PPSV23) vaccine. The PPSV23 vaccine dose should be obtained at least 8 weeks after the dose of PCV13 vaccine. An adult aged 89 years or older who has certain medical conditions and previously received 1 or more doses of PPSV23 vaccine should receive 1 dose of PCV13. The PCV13 vaccine dose should be obtained 1 or more years after the last PPSV23 vaccine dose.  Pneumococcal polysaccharide (PPSV23) vaccine. When PCV13 is also indicated, PCV13 should be obtained first. All adults aged 50 years and older should be immunized. An adult younger than age 61 years who has certain medical conditions should be immunized. Any person who resides in a nursing home or long-term care facility should be immunized. An adult smoker should be immunized. People with an immunocompromised condition and certain other conditions should receive both PCV13 and PPSV23 vaccines. People with human immunodeficiency virus (HIV) infection should be immunized as soon as possible after diagnosis. Immunization during chemotherapy or radiation therapy should be avoided. Routine use of PPSV23 vaccine is not recommended for American Indians, [place] Natives, or people younger than 65 years unless there are medical conditions that require PPSV23 vaccine. When indicated, people who have unknown immunization and have no record of immunization should receive PPSV23 vaccine. One-time revaccination 5 years after the first dose of PPSV23 is recommended for people aged 19-64 years who have chronic kidney failure, nephrotic syndrome, asplenia, or immunocompromised conditions. People who received 1-2 doses of  PPSV23 before age 37 years should receive another dose of PPSV23 vaccine at age 79 years or later if at least 5 years have passed since the previous  dose. Doses of PPSV23 are not needed for people immunized with PPSV23 at or after age 69 years.  Meningococcal vaccine. Adults with asplenia or persistent complement component deficiencies should receive 2 doses of quadrivalent meningococcal conjugate (MenACWY-D) vaccine. The doses should be obtained at least 2 months apart. Microbiologists working with certain meningococcal bacteria, [employer], people at risk during an outbreak, and people who travel to or live in countries with a high rate of meningitis should be immunized. A first-year college student up through age 42 years who is living in a residence hall should receive a dose if she did not receive a dose on or after her 16th birthday. Adults who have certain high-risk conditions should receive one or more doses of vaccine.  Hepatitis A vaccine. Adults who wish to be protected from this disease, have certain high-risk conditions, work with hepatitis A-infected animals, work in hepatitis A research labs, or travel to or work in countries with a high rate of hepatitis A should be immunized. Adults who were previously unvaccinated and who anticipate close contact with an international adoptee during the first 60 days after arrival in the [place] from a country with a high rate of hepatitis A should be immunized.  Hepatitis B vaccine. Adults who wish to be protected from this disease, have certain high-risk conditions, may be exposed to blood or other infectious body fluids, are household contacts or sex partners of hepatitis B positive people, are clients or workers in certain care facilities, or travel to or work in countries with a high rate of hepatitis B should be immunized.  Haemophilus influenzae type b (Hib) vaccine. A previously unvaccinated person with asplenia or sickle cell disease  or having a scheduled splenectomy should receive 1 dose of Hib vaccine. Regardless of previous immunization, a recipient of a hematopoietic stem cell transplant should receive a 3-dose series 6-12 months after her successful transplant. Hib vaccine is not recommended for adults with HIV infection. Preventive Services / Frequency Ages 89 to 25 years  Blood pressure check.** / Every 1 to 2 years.  Lipid and cholesterol check.** / Every 5 years beginning at age 36.  Clinical breast exam.** / Every 3 years for women in their 20s and 4s.  BRCA-related cancer risk assessment.** / For women who have family members with a BRCA-related cancer (breast, ovarian, tubal, or peritoneal cancers).  Pap test.** / Every 2 years from ages 80 through 9. Every 3 years starting at age 50 through age 100 or 76 with a history of 3 consecutive normal Pap tests.  HPV screening.** / Every 3 years from ages 7 through ages 70 to 14 with a history of 3 consecutive normal Pap tests.  Hepatitis C blood test.** / For any individual with known risks for hepatitis C.  Skin self-exam. / Monthly.  Influenza vaccine. / Every year.  Tetanus, diphtheria, and acellular pertussis (Tdap, Td) vaccine.** / Consult your health care provider. Pregnant women should receive 1 dose of Tdap vaccine during each pregnancy. 1 dose of Td every 10 years.  Varicella vaccine.** / Consult your health care provider. Pregnant females who do not have evidence of immunity should receive the first dose after pregnancy.  HPV vaccine. / 3 doses over 6 months, if 65 and younger. The vaccine is not recommended for use in pregnant females. However, pregnancy testing is not needed before receiving a dose.  Measles, mumps, rubella (MMR) vaccine.** / You need at least 1 dose of MMR if you were born  in 1957 or later. You may also need a 2nd dose. For females of childbearing age, rubella immunity should be determined. If there is no evidence of immunity,  females who are not pregnant should be vaccinated. If there is no evidence of immunity, females who are pregnant should delay immunization until after pregnancy.  Pneumococcal 13-valent conjugate (PCV13) vaccine.** / Consult your health care provider.  Pneumococcal polysaccharide (PPSV23) vaccine.** / 1 to 2 doses if you smoke cigarettes or if you have certain conditions.  Meningococcal vaccine.** / 1 dose if you are age 62 to 52 years and a Market researcher living in a residence hall, or have one of several medical conditions, you need to get vaccinated against meningococcal disease. You may also need additional booster doses.  Hepatitis A vaccine.** / Consult your health care provider.  Hepatitis B vaccine.** / Consult your health care provider.  Haemophilus influenzae type b (Hib) vaccine.** / Consult your health care provider. Ages 92 to 56 years  Blood pressure check.** / Every 1 to 2 years.  Lipid and cholesterol check.** / Every 5 years beginning at age 47 years.  Lung cancer screening. / Every year if you are aged 7-80 years and have a 30-pack-year history of smoking and currently smoke or have quit within the past 15 years. Yearly screening is stopped once you have quit smoking for at least 15 years or develop a health problem that would prevent you from having lung cancer treatment.  Clinical breast exam.** / Every year after age 52 years.  BRCA-related cancer risk assessment.** / For women who have family members with a BRCA-related cancer (breast, ovarian, tubal, or peritoneal cancers).  Mammogram.** / Every year beginning at age 93 years and continuing for as long as you are in good health. Consult with your health care provider.  Pap test.** / Every 3 years starting at age 4 years through age 73 or 25 years with a history of 3 consecutive normal Pap tests.  HPV screening.** / Every 3 years from ages 22 years through ages 5 to 70 years with a history of 3  consecutive normal Pap tests.  Fecal occult blood test (FOBT) of stool. / Every year beginning at age 35 years and continuing until age 26 years. You may not need to do this test if you get a colonoscopy every 10 years.  Flexible sigmoidoscopy or colonoscopy.** / Every 5 years for a flexible sigmoidoscopy or every 10 years for a colonoscopy beginning at age 81 years and continuing until age 65 years.  Hepatitis C blood test.** / For all people born from [birth year] through 1965 and any individual with known risks for hepatitis C.  Skin self-exam. / Monthly.  Influenza vaccine. / Every year.  Tetanus, diphtheria, and acellular pertussis (Tdap/Td) vaccine.** / Consult your health care provider. Pregnant women should receive 1 dose of Tdap vaccine during each pregnancy. 1 dose of Td every 10 years.  Varicella vaccine.** / Consult your health care provider. Pregnant females who do not have evidence of immunity should receive the first dose after pregnancy.  Zoster vaccine.** / 1 dose for adults aged 51 years or older.  Measles, mumps, rubella (MMR) vaccine.** / You need at least 1 dose of MMR if you were born in 1957 or later. You may also need a 2nd dose. For females of childbearing age, rubella immunity should be determined. If there is no evidence of immunity, females who are not pregnant should be vaccinated. If there is no  evidence of immunity, females who are pregnant should delay immunization until after pregnancy.  Pneumococcal 13-valent conjugate (PCV13) vaccine.** / Consult your health care provider.  Pneumococcal polysaccharide (PPSV23) vaccine.** / 1 to 2 doses if you smoke cigarettes or if you have certain conditions.  Meningococcal vaccine.** / Consult your health care provider.  Hepatitis A vaccine.** / Consult your health care provider.  Hepatitis B vaccine.** / Consult your health care provider.  Haemophilus influenzae type b (Hib) vaccine.** / Consult your health care  provider. Ages 71 years and over  Blood pressure check.** / Every 1 to 2 years.  Lipid and cholesterol check.** / Every 5 years beginning at age 34 years.  Lung cancer screening. / Every year if you are aged 55-80 years and have a 30-pack-year history of smoking and currently smoke or have quit within the past 15 years. Yearly screening is stopped once you have quit smoking for at least 15 years or develop a health problem that would prevent you from having lung cancer treatment.  Clinical breast exam.** / Every year after age 82 years.  BRCA-related cancer risk assessment.** / For women who have family members with a BRCA-related cancer (breast, ovarian, tubal, or peritoneal cancers).  Mammogram.** / Every year beginning at age 62 years and continuing for as long as you are in good health. Consult with your health care provider.  Pap test.** / Every 3 years starting at age 37 years through age 68 or 25 years with 3 consecutive normal Pap tests. Testing can be stopped between 65 and 70 years with 3 consecutive normal Pap tests and no abnormal Pap or HPV tests in the past 10 years.  HPV screening.** / Every 3 years from ages 52 years through ages 49 or 2 years with a history of 3 consecutive normal Pap tests. Testing can be stopped between 65 and 70 years with 3 consecutive normal Pap tests and no abnormal Pap or HPV tests in the past 10 years.  Fecal occult blood test (FOBT) of stool. / Every year beginning at age 19 years and continuing until age 81 years. You may not need to do this test if you get a colonoscopy every 10 years.  Flexible sigmoidoscopy or colonoscopy.** / Every 5 years for a flexible sigmoidoscopy or every 10 years for a colonoscopy beginning at age 74 years and continuing until age 68 years.  Hepatitis C blood test.** / For all people born from [birth year] through 1965 and any individual with known risks for hepatitis C.  Osteoporosis screening.** / A one-time screening for  women ages 44 years and over and women at risk for fractures or osteoporosis.  Skin self-exam. / Monthly.  Influenza vaccine. / Every year.  Tetanus, diphtheria, and acellular pertussis (Tdap/Td) vaccine.** / 1 dose of Td every 10 years.  Varicella vaccine.** / Consult your health care provider.  Zoster vaccine.** / 1 dose for adults aged 81 years or older.  Pneumococcal 13-valent conjugate (PCV13) vaccine.** / Consult your health care provider.  Pneumococcal polysaccharide (PPSV23) vaccine.** / 1 dose for all adults aged 49 years and older.  Meningococcal vaccine.** / Consult your health care provider.  Hepatitis A vaccine.** / Consult your health care provider.  Hepatitis B vaccine.** / Consult your health care provider.  Haemophilus influenzae type b (Hib) vaccine.** / Consult your health care provider. ** Family history and personal history of risk and conditions may change your health care provider's recommendations. Document Released: 05/21/2001 Document Revised: 08/09/2013 Document Reviewed: 08/20/2010  ExitCare Patient Information 2015 [place]. This information is not intended to replace advice given to you by your health care provider. Make sure you discuss any questions you have with your health care provider.

## 2014-11-21 NOTE — Assessment & Plan Note (Signed)
Had some palp at Levothyroxine 88 daily but is better with 6 days a week

## 2014-11-21 NOTE — Progress Notes (Signed)
Patient ID: Crystal Russell, female   DOB: [birth year], 64 y.o.   MRN: [id number]   Subjective:    Patient ID: Crystal Russell, female    DOB: Oct 20, 1950, 65 y.o.   MRN: [id number]  Chief Complaint  Patient presents with  . Follow-up    discuss referral to Endo    HPI Patient is in today for for annual exam. Doing well. Does note some mild constipation but manages it most days with hi fiber diet, metamucil and good hydration. No bloody or tarry stool. Struggles with some fatigue but no recent illness. Is requesting her TSH be below 2 she believes she feels better when she is there. Less fatigue and constipation. Is ready to proceed with next screening colonoscopy. Denies CP/palp/SOB/HA/congestion/fevers or GU c/o. Taking meds as prescribed  Past Medical History  Diagnosis Date  . Chicken pox as a child  . Shingles 43  . Measles as a child  . Hyperlipidemia   . Atrial fibrillation 2010  . Spondylisthesis 2012    L5/S1 grade 3  . Abdominal wall hernia 08/04/2012  . Ventral hernia 08/05/2012  . Varicose veins 08/05/2012    Extending up to right groin B/l LE  . DJD (degenerative joint disease) 08/05/2012  . Paroxysmal a-fib   . Other and unspecified hyperlipidemia 08/05/2012  . Depression with anxiety 12/27/2012  . Heart murmur     echo 2012  . Wears glasses     Past Surgical History  Procedure Laterality Date  . Varicose vein surgery  1995    right leg  . Dilation and curettage of uterus      x4  . Colonoscopy    . Breast ductal system excision Right 05/05/2014    Procedure: RIGHT BREAST DUCT EXCISION;  Surgeon: Rolm Bookbinder, MD;  Location: [facility];  Service: General;  Laterality: Right;    Family History  Problem Relation Age of Onset  . Cancer Mother     lung- smoker  . Liver disease Mother   . Cancer Father     lung- smoker  . Peripheral Artery Disease Sister   . Dementia Maternal Grandmother   . Heart attack Maternal Grandfather   . Asthma Son   .  Ulcerative colitis Son   . Asthma Son   . Ulcerative colitis Son   . Heart attack Paternal Uncle     Social History   Social History  . Marital Status: Married    Spouse Name: N/A  . Number of Children: N/A  . Years of Education: N/A   Occupational History  . Not on file.   Social History Main Topics  . Smoking status: Never Smoker   . Smokeless tobacco: Never Used  . Alcohol Use: No  . Drug Use: No  . Sexual Activity: Not on file   Other Topics Concern  . Not on file   Social History Narrative    Outpatient Prescriptions Prior to Visit  Medication Sig Dispense Refill  . Ascorbic Acid (VITAMIN C) 100 MG tablet Take 100 mg by mouth daily.    Marland Kitchen aspirin 81 MG tablet Take 81 mg by mouth daily.    Marland Kitchen atorvastatin (LIPITOR) 10 MG tablet Take 1/2 by mouth daily 90 tablet 1  . Calcium 200 MG TABS Take 1 tablet by mouth daily.    . cholecalciferol (VITAMIN D) 400 UNITS TABS Take 400 Units by mouth daily.    . Cholecalciferol (VITAMIN D-3) 1000 UNITS CAPS Take 2,000 Units by mouth daily.    Marland Kitchen  Co-Enzyme Q-10 100 MG CAPS Take 300 mg by mouth daily.    . Docosahexaenoic Acid (DHA) 200 MG CAPS Take 1 capsule by mouth daily.    . Glucosamine-Chondroitin (GLUCOSAMINE CHONDR COMPLEX PO) Take by mouth.    . levothyroxine (SYNTHROID, LEVOTHROID) 88 MCG tablet TAKE 1 TABLET (88 MCG TOTAL) BY MOUTH DAILY BEFORE BREAKFAST. 30 tablet 0  . levothyroxine (SYNTHROID, LEVOTHROID) 88 MCG tablet TAKE 1 TABLET (88 MCG TOTAL) BY MOUTH DAILY BEFORE BREAKFAST. 90 tablet 0  . Multiple Vitamin (MULTIVITAMIN) tablet Take 1 tablet by mouth daily.    . mupirocin ointment (BACTROBAN) 2 % Apply 1 application topically 3 (three) times daily. Apply to affected area for 7-10 days. 30 g 3  . Omega-3 Fatty Acids (EPA PO) Take 300 mg by mouth daily.    Marland Kitchen VITAMIN E COMPLEX PO Take 1 tablet by mouth daily.    Marland Kitchen HYDROcodone-acetaminophen (NORCO/VICODIN) 5-325 MG per tablet Take 1 tablet by mouth every 4 (four) hours as  needed for moderate pain or severe pain. 15 tablet 0  . LIVALO 2 MG TABS TAKE 1 TABLET BY MOUTH EVERY DAY 90 tablet 2  . MAGNESIUM CITRATE PO Take 75 mg by mouth daily.    . fluticasone (FLONASE) 50 MCG/ACT nasal spray Place 2 sprays into both nostrils daily. (Patient not taking: Reported on 11/21/2014) 16 g 5   No facility-administered medications prior to visit.    Allergies  Allergen Reactions  . Crestor [Rosuvastatin]     Joint pain w/fibromyalgia  . Penicillins     Hands and feet swell, rash  . Pravastatin     Joint pain w/fibromyalgia    Review of Systems  Constitutional: Positive for malaise/fatigue. Negative for fever.  HENT: Negative for congestion.   Eyes: Negative for discharge.  Respiratory: Negative for shortness of breath.   Cardiovascular: Negative for chest pain, palpitations and leg swelling.  Gastrointestinal: Positive for constipation. Negative for nausea, abdominal pain, blood in stool and melena.  Genitourinary: Negative for dysuria.  Musculoskeletal: Negative for falls.  Skin: Negative for rash.  Neurological: Negative for loss of consciousness and headaches.  Endo/Heme/Allergies: Negative for environmental allergies.  Psychiatric/Behavioral: Negative for depression. The patient is not nervous/anxious.        Objective:    Physical Exam  BP 104/62 mmHg  Pulse 77  Temp(Src) 97.8 F (36.6 C) (Oral)  Ht 5\' 5"  (1.651 m)  Wt 145 lb (65.772 kg)  BMI 24.13 kg/m2  SpO2 97% Wt Readings from Last 3 Encounters:  11/21/14 145 lb (65.772 kg)  05/05/14 147 lb 8 oz (66.906 kg)  12/08/13 145 lb (65.772 kg)     Lab Results  Component Value Date   WBC 4.6 10/26/2014   HGB 12.1 10/26/2014   HCT 35.8* 10/26/2014   PLT 240.0 10/26/2014   GLUCOSE 90 10/26/2014   CHOL 161 10/26/2014   TRIG 57.0 10/26/2014   HDL 60.00 10/26/2014   LDLCALC 90 10/26/2014   ALT 26 10/26/2014   AST 32 10/26/2014   NA 135 10/26/2014   K 4.4 10/26/2014   CL 100 10/26/2014    CREATININE 0.68 10/26/2014   BUN 14 10/26/2014   CO2 32 10/26/2014   TSH 3.29 10/26/2014   HGBA1C 5.5 10/26/2014    Lab Results  Component Value Date   TSH 3.29 10/26/2014   Lab Results  Component Value Date   WBC 4.6 10/26/2014   HGB 12.1 10/26/2014   HCT 35.8* 10/26/2014   MCV 87.0  10/26/2014   PLT 240.0 10/26/2014   Lab Results  Component Value Date   NA 135 10/26/2014   K 4.4 10/26/2014   CO2 32 10/26/2014   GLUCOSE 90 10/26/2014   BUN 14 10/26/2014   CREATININE 0.68 10/26/2014   BILITOT 1.0 10/26/2014   ALKPHOS 58 10/26/2014   AST 32 10/26/2014   ALT 26 10/26/2014   PROT 6.3 10/26/2014   ALBUMIN 4.0 10/26/2014   CALCIUM 9.3 10/26/2014   ANIONGAP 10 05/03/2014   GFR 92.61 10/26/2014   Lab Results  Component Value Date   CHOL 161 10/26/2014   Lab Results  Component Value Date   HDL 60.00 10/26/2014   Lab Results  Component Value Date   LDLCALC 90 10/26/2014   Lab Results  Component Value Date   TRIG 57.0 10/26/2014   Lab Results  Component Value Date   CHOLHDL 3 10/26/2014   Lab Results  Component Value Date   HGBA1C 5.5 10/26/2014       Assessment & Plan:   Paroxysmal a-fib Had some palp at Levothyroxine 88 daily but is better with 6 days a week  Hypothyroidism On Levothyroxine, continue to monitor  H/O gestational diabetes mellitus, not currently pregnant hgba1c acceptable, minimize simple carbs. Increase exercise as tolerated.   Sun-damaged skin Referred to dermatology for further evaluation  Hyperlipidemia, mixed Tolerating statin, encouraged heart healthy diet, avoid trans fats, minimize simple carbs and saturated fats. Increase exercise as tolerated  Preventative health care Patient encouraged to maintain heart healthy diet, regular exercise, adequate sleep. Consider daily probiotics. Take medications as prescribed. Given and reviewed copy of ACP documents from [employer] and encouraged to complete and return. Labs  ordered and reviewed   I have discontinued Ms. Hillmann's MAGNESIUM CITRATE PO, LIVALO, and HYDROcodone-acetaminophen. I am also having her maintain her aspirin, Co-Enzyme Q-10, Glucosamine-Chondroitin (GLUCOSAMINE CHONDR COMPLEX PO), vitamin C, multivitamin, Vitamin D-3, cholecalciferol, Calcium, VITAMIN E COMPLEX PO, Omega-3 Fatty Acids (EPA PO), DHA, mupirocin ointment, fluticasone, levothyroxine, atorvastatin, and levothyroxine.  No orders of the defined types were placed in this encounter.     Elizabeth Sauer, LPN

## 2014-11-21 NOTE — Progress Notes (Signed)
Pre visit review using our clinic review tool, if applicable. No additional management support is needed unless otherwise documented below in the visit note. 

## 2014-12-04 ENCOUNTER — Encounter: Payer: Self-pay | Admitting: Family Medicine

## 2014-12-04 DIAGNOSIS — L578 Other skin changes due to chronic exposure to nonionizing radiation: Secondary | ICD-10-CM | POA: Insufficient documentation

## 2014-12-04 NOTE — Assessment & Plan Note (Signed)
On Levothyroxine, continue to monitor 

## 2014-12-04 NOTE — Assessment & Plan Note (Signed)
Tolerating statin, encouraged heart healthy diet, avoid trans fats, minimize simple carbs and saturated fats. Increase exercise as tolerated 

## 2014-12-04 NOTE — Assessment & Plan Note (Signed)
Referred to dermatology for further evaluation.

## 2014-12-04 NOTE — Assessment & Plan Note (Signed)
hgba1c acceptable, minimize simple carbs. Increase exercise as tolerated.  

## 2014-12-04 NOTE — Assessment & Plan Note (Signed)
Patient encouraged to maintain heart healthy diet, regular exercise, adequate sleep. Consider daily probiotics. Take medications as prescribed. Given and reviewed copy of ACP documents from [place] Secretary of State and encouraged to complete and return. Labs ordered and reviewed.  

## 2015-01-09 ENCOUNTER — Encounter: Payer: Self-pay | Admitting: Gastroenterology

## 2015-01-25 ENCOUNTER — Ambulatory Visit (INDEPENDENT_AMBULATORY_CARE_PROVIDER_SITE_OTHER): Payer: 59

## 2015-01-25 DIAGNOSIS — Z23 Encounter for immunization: Secondary | ICD-10-CM | POA: Diagnosis not present

## 2015-02-13 ENCOUNTER — Other Ambulatory Visit (INDEPENDENT_AMBULATORY_CARE_PROVIDER_SITE_OTHER): Payer: 59

## 2015-02-13 DIAGNOSIS — E039 Hypothyroidism, unspecified: Secondary | ICD-10-CM

## 2015-02-13 LAB — TSH: TSH: 2.31 u[IU]/mL (ref 0.35–4.50)

## 2015-02-13 LAB — T4, FREE: Free T4: 1.12 ng/dL (ref 0.60–1.60)

## 2015-02-15 ENCOUNTER — Encounter: Payer: Self-pay | Admitting: Family Medicine

## 2015-02-20 ENCOUNTER — Ambulatory Visit: Payer: 59 | Admitting: Family Medicine

## 2015-02-21 ENCOUNTER — Encounter: Payer: Self-pay | Admitting: Family Medicine

## 2015-02-23 ENCOUNTER — Other Ambulatory Visit: Payer: Self-pay | Admitting: Family Medicine

## 2015-03-16 ENCOUNTER — Other Ambulatory Visit: Payer: Self-pay | Admitting: Family Medicine

## 2015-03-16 MED ORDER — LEVOTHYROXINE SODIUM 88 MCG PO TABS: ORAL_TABLET | ORAL | Status: DC

## 2015-03-23 ENCOUNTER — Encounter: Payer: Self-pay | Admitting: Gastroenterology

## 2015-03-23 ENCOUNTER — Ambulatory Visit (INDEPENDENT_AMBULATORY_CARE_PROVIDER_SITE_OTHER): Payer: 59 | Admitting: Gastroenterology

## 2015-03-23 VITALS — BP 98/54 | HR 66 | Ht 64.5 in | Wt 147.0 lb

## 2015-03-23 DIAGNOSIS — K5901 Slow transit constipation: Secondary | ICD-10-CM

## 2015-03-23 DIAGNOSIS — Z1211 Encounter for screening for malignant neoplasm of colon: Secondary | ICD-10-CM | POA: Diagnosis not present

## 2015-03-23 MED ORDER — POLYETHYLENE GLYCOL 3350 17 GM/SCOOP PO POWD: ORAL | Status: DC

## 2015-03-23 NOTE — Patient Instructions (Signed)
We have sent the following medications to your pharmacy for you to pick up at your convenience:  Miralax  

## 2015-03-23 NOTE — Progress Notes (Signed)
HPI :  64 y/o female seen in consultation for possible colonoscopy and constipation, from Penni Homans MD.  She reports ongoing constipation which has been chronic. She reports a BM once every 6 days on average. She uses metamucil which helps slightly. She eats a high fiber diet. She has significant straining. She does need to do manual maneuvers rarely to help have a bowel movements. She denies blood in the stools. She passes hard stool, or passes hard pellets. High fats food can help her have a BM. She has not tried anything other than metamucil. She denies abdominal pains. She has had intentional weight loss, trying to lose weight. No anemia. In 2010 she had blood in the stools which led to EGD, capsule, and colonoscopy. As outlined below, all were normal with exception of hemorrhoids on colonoscopy. No adenomas removed. She reports she has had 4-5 colonoscopies in her life, she has not had any prior adenomas.   She has 2 children with ulcerative colitis, and as well as eosinophilic esophagitis. No family history of colon cancer.   Colonoscopy done in [place] in 2010 April - normal exam other than diverticulosis and internal hemorrhoids. She was told to consider a repeat exam in 5 years but she did not know why.  Capsule endoscopy 2010 - normal EGD 2010 - benign gastric polyp    Past Medical History  Diagnosis Date  . Chicken pox as a child  . Shingles 43  . Measles as a child  . Hyperlipidemia   . Atrial fibrillation ([facility]) 2010  . Spondylisthesis 2012    L5/S1 grade 3  . Abdominal wall hernia 08/04/2012  . Ventral hernia 08/05/2012  . Varicose veins 08/05/2012    Extending up to right groin B/l LE  . DJD (degenerative joint disease) 08/05/2012  . Paroxysmal a-fib ([facility])   . Other and unspecified hyperlipidemia 08/05/2012  . Depression with anxiety 12/27/2012  . Heart murmur     echo 2012  . Wears glasses   . H/O gestational diabetes mellitus, not currently pregnant 08/05/2012    History  of      Past Surgical History  Procedure Laterality Date  . Varicose vein surgery  1995    right leg  . Dilation and curettage of uterus      x4  . Colonoscopy    . Breast ductal system excision Right 05/05/2014    Procedure: RIGHT BREAST DUCT EXCISION;  Surgeon: Rolm Bookbinder, MD;  Location: [facility];  Service: General;  Laterality: Right;   Family History  Problem Relation Age of Onset  . Cancer Mother     lung- smoker  . Liver disease Mother   . Cancer Father     lung- smoker  . Peripheral Artery Disease Sister   . Dementia Maternal Grandmother   . Heart attack Maternal Grandfather   . Asthma Son   . Ulcerative colitis Son   . Asthma Son   . Ulcerative colitis Son   . Heart attack Paternal Uncle    Social History  Substance Use Topics  . Smoking status: Never Smoker   . Smokeless tobacco: Never Used  . Alcohol Use: No   Current Outpatient Prescriptions  Medication Sig Dispense Refill  . Ascorbic Acid (VITAMIN C) 100 MG tablet Take 100 mg by mouth daily.    Marland Kitchen aspirin 81 MG tablet Take 81 mg by mouth daily.    Marland Kitchen atorvastatin (LIPITOR) 10 MG tablet Take 1/2 by mouth daily 90 tablet  1  . Calcium 200 MG TABS Take 1 tablet by mouth daily.    . cholecalciferol (VITAMIN D) 400 UNITS TABS Take 400 Units by mouth daily.    . Cholecalciferol (VITAMIN D-3) 1000 UNITS CAPS Take 2,000 Units by mouth daily.    Marland Kitchen Co-Enzyme Q-10 100 MG CAPS Take 300 mg by mouth daily.    . Docosahexaenoic Acid (DHA) 200 MG CAPS Take 1 capsule by mouth daily.    . Glucosamine-Chondroitin (GLUCOSAMINE CHONDR COMPLEX PO) Take by mouth.    . levothyroxine (LEVOTHROID) 25 MCG tablet 1-2 TABLETS AS NEEDED WEEKLY. SAME MANUFACTURE AS 88 MCG. 10 tablet 5  . levothyroxine (SYNTHROID, LEVOTHROID) 88 MCG tablet TAKE 1 TABLET (88 MCG TOTAL) BY MOUTH DAILY BEFORE BREAKFAST. USE SAME MANUFACTURER AS 25 MCG 90 tablet 1  . Multiple Vitamin (MULTIVITAMIN) tablet Take 1 tablet by mouth daily.      . mupirocin ointment (BACTROBAN) 2 % Apply 1 application topically 3 (three) times daily. Apply to affected area for 7-10 days. 30 g 3  . Omega-3 Fatty Acids (EPA PO) Take 300 mg by mouth daily.     No current facility-administered medications for this visit.   Allergies  Allergen Reactions  . Crestor [Rosuvastatin]     Joint pain w/fibromyalgia  . Penicillins     Hands and feet swell, rash  . Pravastatin     Joint pain w/fibromyalgia     Review of Systems: All systems reviewed and negative except where noted in HPI.   Lab Results  Component Value Date   WBC 4.6 10/26/2014   HGB 12.1 10/26/2014   HCT 35.8* 10/26/2014   MCV 87.0 10/26/2014   PLT 240.0 10/26/2014    Lab Results  Component Value Date   CREATININE 0.68 10/26/2014   BUN 14 10/26/2014   NA 135 10/26/2014   K 4.4 10/26/2014   CL 100 10/26/2014   CO2 32 10/26/2014    Lab Results  Component Value Date   ALT 26 10/26/2014   AST 32 10/26/2014   ALKPHOS 58 10/26/2014   BILITOT 1.0 10/26/2014      Physical Exam: BP 98/54 mmHg  Pulse 66  Ht 5' 4.5" (1.638 m)  Wt 147 lb (66.679 kg)  BMI 24.85 kg/m2 Constitutional: Pleasant,well-developed, female in no acute distress. HEENT: Normocephalic and atraumatic. Conjunctivae are normal. No scleral icterus. Neck supple.  Cardiovascular: Normal rate, regular rhythm.  Pulmonary/chest: Effort normal and breath sounds normal. No wheezing, rales or rhonchi. Abdominal: Soft, nondistended, nontender. Bowel sounds active throughout. There are no masses palpable. No hepatomegaly. Extremities:  (+) 1 edema LE B Lymphadenopathy: No cervical adenopathy noted. Neurological: Alert and oriented to person place and time. Skin: Skin is warm and dry. No rashes noted. Psychiatric: Normal mood and affect. Behavior is normal.   ASSESSMENT AND PLAN: 64 y/o female seen in consultation for constipation and possible colonoscopy screening.   Constipation history is as above.  Longstanding without alarm symptoms or anemia. She has only tried metamucil with minimal benefit. Recommend a trial of Miralax one dose BID and titrate up as needed. If this does not help she can call for reassessment, and can consider alternative agents and may consider manometry to rule out pelvic floor dysfunction given need to use manual maneuvers occasional to have a bowel movement.   Otherwise, she has had a prior colonoscopy in 2010, report reviewed. She had no polyps, a good prep noted. She has no FH of CRC. She has had multiple  colonoscopies historically without adenomas. The colonoscopy report from 2010 mentions repeating an exam in 5 years but I don't see any indication for a colonoscopy at this time unless she has anxiety about this and wishes to have it for piece of mind. I reassured her and she is next due for CRC screening in 2020. If she has change in symptoms in the interim or other alarm symptoms I asked her to contact me and we can reassess her. She agreed.   [place] Cellar, MD [facility] Pager [phone]  CC: Dr. Penni Homans

## 2015-06-04 ENCOUNTER — Other Ambulatory Visit: Payer: Self-pay | Admitting: Family Medicine

## 2015-07-12 ENCOUNTER — Encounter: Payer: Self-pay | Admitting: Family Medicine

## 2015-07-20 ENCOUNTER — Other Ambulatory Visit: Payer: Self-pay | Admitting: Family Medicine

## 2015-09-06 ENCOUNTER — Ambulatory Visit (HOSPITAL_BASED_OUTPATIENT_CLINIC_OR_DEPARTMENT_OTHER)
Admission: RE | Admit: 2015-09-06 | Discharge: 2015-09-06 | Disposition: A | Discharge status: Home / Self Care | Payer: 59 | Source: Ambulatory Visit | Attending: Medical | Admitting: Medical

## 2015-09-06 ENCOUNTER — Ambulatory Visit (INDEPENDENT_AMBULATORY_CARE_PROVIDER_SITE_OTHER): Payer: 59 | Admitting: Medical

## 2015-09-06 ENCOUNTER — Encounter: Payer: Self-pay | Admitting: Medical

## 2015-09-06 ENCOUNTER — Telehealth: Payer: Self-pay | Admitting: Medical

## 2015-09-06 VITALS — BP 96/58 | HR 49 | Temp 98.0°F | Ht 64.5 in | Wt 145.0 lb

## 2015-09-06 DIAGNOSIS — I8390 Asymptomatic varicose veins of unspecified lower extremity: Secondary | ICD-10-CM

## 2015-09-06 DIAGNOSIS — I868 Varicose veins of other specified sites: Secondary | ICD-10-CM | POA: Diagnosis not present

## 2015-09-06 DIAGNOSIS — I82812 Embolism and thrombosis of superficial veins of left lower extremities: Secondary | ICD-10-CM | POA: Insufficient documentation

## 2015-09-06 DIAGNOSIS — M25562 Pain in left knee: Secondary | ICD-10-CM | POA: Insufficient documentation

## 2015-09-06 DIAGNOSIS — I809 Phlebitis and thrombophlebitis of unspecified site: Secondary | ICD-10-CM

## 2015-09-06 NOTE — Progress Notes (Signed)
Pre visit review using our clinic review tool, if applicable. No additional management support is needed unless otherwise documented below in the visit note. 

## 2015-09-06 NOTE — Progress Notes (Addendum)
Subjective:    Patient ID: Crystal Russell, female    DOB: 26-Jun-1950, 65 y.o.   MRN: [id number]  HPI  Pt in states she has some left leg pain that started on Sunday. Pt has history of varicose veins. She has tender swollen area over course of veins in her left leg. Pt feels some improvement in lower portion varicosities. But hight up in calf the area is tender. Typically less pain ful after leg elevation. Pt painted a lot this weekend. Standing in same position for prolonged periods.  Pt states standing will increase pain in veins. Area tender to touch.  Pt has appointment with vascular specialist this coming Monday.   Review of Systems  Constitutional: Negative for fever, chills and fatigue.  Respiratory: Negative for cough, chest tightness and wheezing.   Cardiovascular: Negative for chest pain and palpitations.  Gastrointestinal: Negative for abdominal pain.  Musculoskeletal:       See hpi.  Neurological: Negative for dizziness and headaches.  Hematological: Negative for adenopathy. Does not bruise/bleed easily.  Psychiatric/Behavioral: Negative for behavioral problems and confusion.     Past Medical History  Diagnosis Date  . Chicken pox as a child  . Shingles 43  . Measles as a child  . Hyperlipidemia   . Atrial fibrillation ([place]) 2010  . Spondylisthesis 2012    L5/S1 grade 3  . Abdominal wall hernia 08/04/2012  . Ventral hernia 08/05/2012  . Varicose veins 08/05/2012    Extending up to right groin B/l LE  . DJD (degenerative joint disease) 08/05/2012  . Paroxysmal a-fib ([place])   . Other and unspecified hyperlipidemia 08/05/2012  . Depression with anxiety 12/27/2012  . Heart murmur     echo 2012  . Wears glasses   . H/O gestational diabetes mellitus, not currently pregnant 08/05/2012    History of      Social History   Social History  . Marital Status: Married    Spouse Name: N/A  . Number of Children: N/A  . Years of Education: N/A   Occupational History  .  Not on file.   Social History Main Topics  . Smoking status: Never Smoker   . Smokeless tobacco: Never Used  . Alcohol Use: No  . Drug Use: No  . Sexual Activity: Not on file   Other Topics Concern  . Not on file   Social History Narrative    Past Surgical History  Procedure Laterality Date  . Varicose vein surgery  1995    right leg  . Dilation and curettage of uterus      x4  . Colonoscopy    . Breast ductal system excision Right 05/05/2014    Procedure: RIGHT BREAST DUCT EXCISION;  Surgeon: Rolm Bookbinder, MD;  Location: [facility];  Service: General;  Laterality: Right;    Family History  Problem Relation Age of Onset  . Cancer Mother     lung- smoker  . Liver disease Mother   . Cancer Father     lung- smoker  . Peripheral Artery Disease Sister   . Dementia Maternal Grandmother   . Heart attack Maternal Grandfather   . Asthma Son   . Ulcerative colitis Son   . Asthma Son   . Ulcerative colitis Son   . Heart attack Paternal Uncle     Allergies  Allergen Reactions  . Crestor [Rosuvastatin]     Joint pain w/fibromyalgia  . Penicillins     Hands and feet swell, rash  .  Pravastatin     Joint pain w/fibromyalgia    Current Outpatient Prescriptions on File Prior to Visit  Medication Sig Dispense Refill  . Ascorbic Acid (VITAMIN C) 100 MG tablet Take 100 mg by mouth daily.    Marland Kitchen aspirin 81 MG tablet Take 81 mg by mouth daily.    Marland Kitchen atorvastatin (LIPITOR) 10 MG tablet TAKE 1/2 TABLET BY MOUTH EVERY DAY 90 tablet 1  . Calcium 200 MG TABS Take 1 tablet by mouth daily.    . cholecalciferol (VITAMIN D) 400 UNITS TABS Take 400 Units by mouth daily.    . Cholecalciferol (VITAMIN D-3) 1000 UNITS CAPS Take 2,000 Units by mouth daily.    Marland Kitchen Co-Enzyme Q-10 100 MG CAPS Take 300 mg by mouth daily.    . Docosahexaenoic Acid (DHA) 200 MG CAPS Take 1 capsule by mouth daily.    . Glucosamine-Chondroitin (GLUCOSAMINE CHONDR COMPLEX PO) Take by mouth.    .  levothyroxine (LEVOTHROID) 25 MCG tablet 1-2 TABLETS AS NEEDED WEEKLY. SAME MANUFACTURE AS 88 MCG. 10 tablet 5  . levothyroxine (SYNTHROID, LEVOTHROID) 88 MCG tablet TAKE 1 TABLET (88 MCG TOTAL) BY MOUTH DAILY BEFORE BREAKFAST. USE SAME MANUFACTURER AS 25 MCG 90 tablet 1  . levothyroxine (SYNTHROID, LEVOTHROID) 88 MCG tablet TAKE 1 TABLET (88 MCG TOTAL) BY MOUTH DAILY BEFORE BREAKFAST. 90 tablet 0  . Multiple Vitamin (MULTIVITAMIN) tablet Take 1 tablet by mouth daily.    . mupirocin ointment (BACTROBAN) 2 % Apply 1 application topically 3 (three) times daily. Apply to affected area for 7-10 days. 30 g 3  . Omega-3 Fatty Acids (EPA PO) Take 300 mg by mouth daily.    . polyethylene glycol powder (GLYCOLAX/MIRALAX) powder Take 17grams twice a day. 500 g 3   No current facility-administered medications on file prior to visit.    BP 96/58 mmHg  Pulse 49  Temp(Src) 98 F (36.7 C) (Oral)  Ht 5' 4.5" (1.638 m)  Wt 145 lb (65.772 kg)  BMI 24.51 kg/m2  SpO2 97%       Objective:   Physical Exam   General- No acute distress. Pleasant patient. Neck- Full range of motion, no jvd Lungs- Clear, even and unlabored. Heart- regular rate and rhythm. Neurologic- CNII- XII grossly intact.  Lt lower ext- dilated  tortuous veins all over left lower ext calf. Upper portion of calf veins is tender and feels indurated mildly(no redness). Neg homan sign.     Assessment & Plan:  Please get left lower ext [place] today at 10:30 am. Result will be stat read.  Presently plan will be warm compresses and use ibuprofen for pain and inflammation.  Will try to fax over result of ultrasound to the vein specialist office since you will see them on Monday.  If dvt will need to be on xarelto. For extensive clot of superficial vein xarelto may be needed as well.  Vein specialist number is [phone] (vascular and vein specialist of [place]).  Follow up in 7-10 days or as needed  Jahara Dail, Percell Miller, [employer]

## 2015-09-06 NOTE — Patient Instructions (Addendum)
Please get left lower ext [place] today at 10:30 am. Result will be stat read.  Presently plan will be warm compresses and use ibuprofen for pain and inflammation.  Will try to fax over result of ultrasound to the vein specialist office since you will see them on Monday.  If dvt will need to be on xarelto. For extensive clot of superficial vein xarelto may be needed as well.  Follow up in 7-10 days or as needed

## 2015-09-07 ENCOUNTER — Other Ambulatory Visit: Payer: Self-pay | Admitting: Family Medicine

## 2015-09-07 ENCOUNTER — Telehealth: Payer: Self-pay

## 2015-09-07 ENCOUNTER — Encounter: Payer: Self-pay | Admitting: Vascular Surgery

## 2015-09-07 NOTE — Telephone Encounter (Signed)
Opened to review 

## 2015-09-07 NOTE — Telephone Encounter (Signed)
Sched appt 6/5 at 9:45.

## 2015-09-07 NOTE — Telephone Encounter (Signed)
Phone call from pt.  Requesting an appt. prior to next week for painful left leg.  Reported she spoke with someone in our office on Tues.,5/30, and was told she could have an ultrasound, but would have to wait until next week, to see a provider.  Stated she does not feel that is good service.  Noted pt. had a venous ultrasound done on 5/31; study ruled out DVT, and showed superficial thrombophlebitis in left LE.  Advised the pt. there are no appts. available until Monday, 6/5.  Tried to provide reassurance that the fact that it is a superficial thrombophlebitis, it is no life-threatening.  Recommended to apply warm, moist compresses, elevate the left leg, and to take Ibuprofen for the inflammation and discomfort.  Appt. offered for 09/11/15 @ 9:45 AM.  The pt. Verb. Understanding of instructions, and agreed with appt.;  stated she is only interested in having the painful area on left leg evaluated at this time, by an MD, and will consider doing a workup for varicose veins in the future.

## 2015-09-08 ENCOUNTER — Telehealth: Payer: Self-pay | Admitting: Medical

## 2015-09-08 DIAGNOSIS — I809 Phlebitis and thrombophlebitis of unspecified site: Secondary | ICD-10-CM

## 2015-09-08 NOTE — Telephone Encounter (Signed)
Put that order in stat.

## 2015-09-08 NOTE — Telephone Encounter (Signed)
Order sent to [facility]

## 2015-09-11 ENCOUNTER — Ambulatory Visit (INDEPENDENT_AMBULATORY_CARE_PROVIDER_SITE_OTHER): Payer: 59 | Admitting: Vascular Surgery

## 2015-09-11 ENCOUNTER — Encounter: Payer: Self-pay | Admitting: Vascular Surgery

## 2015-09-11 VITALS — BP 104/68 | HR 53 | Ht 64.5 in | Wt 145.0 lb

## 2015-09-11 DIAGNOSIS — I83893 Varicose veins of bilateral lower extremities with other complications: Secondary | ICD-10-CM

## 2015-09-11 NOTE — Progress Notes (Signed)
Subjective:     Patient ID: Crystal Russell, female   DOB: November 25, 1950, 65 y.o.   MRN: [id number]  HPI this 65 year old female returns for continued follow-up regarding her severe bilateral painful varicosities. She was evaluated in 2015 by Dr. Gae Gallop and the patient did not desire treatment at that time. She has a remote history of right great saphenous vein stripping. She has developed painful varicosities in the right calf and distal thigh and in the left medial thigh and calf over the years with progressive edema left worse than right. She recently 10 days ago developed superficial thrombophlebitis in the left medial calf and did have a ultrasound study performed which revealed no DVT but superficial acute and chronic thrombus in the left medial calf varicosities. She also has a history of bleeding in the left ankle from an eschar that she scratched at night and has chronic edema in both ankles. She does not were elastic compression stockings nor elevate her legs and a regular basis. Symptoms are continuing to worsen and are affecting her daily living.  Past Medical History  Diagnosis Date  . Chicken pox as a child  . Shingles 43  . Measles as a child  . Hyperlipidemia   . Atrial fibrillation ([place]) 2010  . Spondylisthesis 2012    L5/S1 grade 3  . Abdominal wall hernia 08/04/2012  . Ventral hernia 08/05/2012  . Varicose veins 08/05/2012    Extending up to right groin B/l LE  . DJD (degenerative joint disease) 08/05/2012  . Paroxysmal a-fib ([place])   . Other and unspecified hyperlipidemia 08/05/2012  . Depression with anxiety 12/27/2012  . Heart murmur     echo 2012  . Wears glasses   . H/O gestational diabetes mellitus, not currently pregnant 08/05/2012    History of     Social History  Substance Use Topics  . Smoking status: Never Smoker   . Smokeless tobacco: Never Used  . Alcohol Use: No    Family History  Problem Relation Age of Onset  . Cancer Mother     lung- smoker  . Liver  disease Mother   . Cancer Father     lung- smoker  . Peripheral Artery Disease Sister   . Dementia Maternal Grandmother   . Heart attack Maternal Grandfather   . Asthma Son   . Ulcerative colitis Son   . Asthma Son   . Ulcerative colitis Son   . Heart attack Paternal Uncle     Allergies  Allergen Reactions  . Crestor [Rosuvastatin]     Joint pain w/fibromyalgia  . Penicillins     Hands and feet swell, rash  . Pravastatin     Joint pain w/fibromyalgia     Current outpatient prescriptions:  .  Ascorbic Acid (VITAMIN C) 100 MG tablet, Take 100 mg by mouth daily., Disp: , Rfl:  .  aspirin 81 MG tablet, Take 81 mg by mouth daily., Disp: , Rfl:  .  atorvastatin (LIPITOR) 10 MG tablet, TAKE 1/2 TABLET BY MOUTH EVERY DAY, Disp: 90 tablet, Rfl: 1 .  Calcium 200 MG TABS, Take 1 tablet by mouth daily., Disp: , Rfl:  .  cholecalciferol (VITAMIN D) 400 UNITS TABS, Take 400 Units by mouth daily., Disp: , Rfl:  .  Cholecalciferol (VITAMIN D-3) 1000 UNITS CAPS, Take 2,000 Units by mouth daily., Disp: , Rfl:  .  Co-Enzyme Q-10 100 MG CAPS, Take 300 mg by mouth daily., Disp: , Rfl:  .  Docosahexaenoic Acid (DHA)  200 MG CAPS, Take 1 capsule by mouth daily., Disp: , Rfl:  .  Glucosamine-Chondroitin (GLUCOSAMINE CHONDR COMPLEX PO), Take by mouth., Disp: , Rfl:  .  levothyroxine (LEVOTHROID) 25 MCG tablet, 1-2 TABLETS AS NEEDED WEEKLY. SAME MANUFACTURE AS 88 MCG., Disp: 10 tablet, Rfl: 5 .  levothyroxine (SYNTHROID, LEVOTHROID) 88 MCG tablet, TAKE 1 TABLET (88 MCG TOTAL) BY MOUTH DAILY BEFORE BREAKFAST. USE SAME MANUFACTURER AS 25 MCG, Disp: 90 tablet, Rfl: 1 .  levothyroxine (SYNTHROID, LEVOTHROID) 88 MCG tablet, TAKE 1 TABLET (88 MCG TOTAL) BY MOUTH DAILY BEFORE BREAKFAST., Disp: 90 tablet, Rfl: 0 .  Multiple Vitamin (MULTIVITAMIN) tablet, Take 1 tablet by mouth daily., Disp: , Rfl:  .  mupirocin ointment (BACTROBAN) 2 %, Apply 1 application topically 3 (three) times daily. Apply to affected area  for 7-10 days., Disp: 30 g, Rfl: 3 .  Omega-3 Fatty Acids (EPA PO), Take 300 mg by mouth daily., Disp: , Rfl:  .  polyethylene glycol powder (GLYCOLAX/MIRALAX) powder, Take 17grams twice a day., Disp: 500 g, Rfl: 3  Filed Vitals:   09/11/15 0949  BP: 104/68  Pulse: 53  Height: 5' 4.5" (1.638 m)  Weight: 145 lb (65.772 kg)  SpO2: 100%    Body mass index is 24.51 kg/(m^2).           Review of Systems denies chest pain, dyspnea on exertion, PND, orthopnea. Does have history of A. fib but does not take chronic anticoagulation other than aspirin. See history of present illness. Other systems negative and complete review of systems.     Objective:   Physical Exam BP 104/68 mmHg  Pulse 53  Ht 5' 4.5" (1.638 m)  Wt 145 lb (65.772 kg)  BMI 24.51 kg/m2  SpO2 100%    Gen.-alert and oriented x3 in no apparent distress HEENT normal for age Lungs no rhonchi or wheezing Cardiovascular regular rhythm no murmurs carotid pulses 3+ palpable no bruits audible Abdomen soft nontender no palpable masses Musculoskeletal free of  major deformities Skin clear -no rashes Neurologic normal Lower extremities 3+ femoral and dorsalis pedis pulses palpable bilaterally with 1+ edema bilaterally Left leg with extensive bulging varicosities beginning in the distal medial thigh extending into the medial calf and also pretibial and lateral calf down to the lateral malleolus where there is hyperpigmentation and evidence of a previous eschar. Extensive network of reticular veins left ankle area. Right leg with 1+ edema below the knee with bulging varicosities distal thigh medial calf and proximal medial thigh has network of varicosities as well.  Today I reviewed the venous duplex exam from 2015 which revealed gross reflux and large caliber left great saphenous vein supplying these painful varicosities. Also revealed gross reflux in the great saphenous vein on the right leg from the knee to the proximal  thigh supplying the painful varicosities. There is an extensive network of varicosities near the saphenofemoral junction on the right where the previous stripping was performed.  Today I performed a independent bedside sono site exam confirming the above findings with bilateral great saphenous reflux supplying these painful varicosities       Assessment:     Bilateral painful varicosities left worse than right with recent history of superficial thrombophlebitis left calf and bleeding several months ago left ankle where an eschar was located. Chronic edema both feet and ankles with symptoms which are affecting patient's daily living. This is due to gross reflux bilateral great saphenous veins. Hyperlipidemia History of A. fib History of ventral hernia  Plan:         #1 long leg elastic compression stockings 20-30 mm gradient #2 elevate legs as much as possible #3 ibuprofen daily on a regular basis for pain #4 return in 3 months-if no significant improvement then she will need #1 laser ablation left great saphenous vein followed by #2 laser ablation right great saphenous vein followed by three-month waiting.. She does have extensive bulging varicosities and will likely require greater than 20 stab phlebectomy of painful varicosities after the three-month waiting. If they are not significantly decompressed  Patient will return in 3 months  I spent 40 minutes with patient and her husband answering questions about this treatment plan and also performing bedside sono site exam and discussing potential complications and other issues regarding her care

## 2015-10-03 ENCOUNTER — Encounter: Payer: Self-pay | Admitting: Medical

## 2015-10-03 ENCOUNTER — Ambulatory Visit (INDEPENDENT_AMBULATORY_CARE_PROVIDER_SITE_OTHER): Payer: 59 | Admitting: Medical

## 2015-10-03 VITALS — BP 98/60 | HR 51 | Ht 64.5 in | Wt 145.0 lb

## 2015-10-03 DIAGNOSIS — L089 Local infection of the skin and subcutaneous tissue, unspecified: Secondary | ICD-10-CM | POA: Diagnosis not present

## 2015-10-03 MED ORDER — DOXYCYCLINE HYCLATE 100 MG PO TABS: 100.0000 mg | ORAL_TABLET | Freq: Two times a day (BID) | ORAL | Status: DC

## 2015-10-03 NOTE — Progress Notes (Signed)
Subjective:    Patient ID: Crystal Russell, female    DOB: 1950/07/09, 65 y.o.   MRN: [id number]  HPI  Pt got bit by something on Sunday night. She thinks was a mosquito. Site distal wrist. Over past 2 days redness spreading up arm. Faint pain. But redness expanding up mid forearm. Pt has some marks on her forearm showing how getting worse each day.  Pt does not get skin infection easily.       Review of Systems  Constitutional: Negative for fever, chills and fatigue.  Respiratory: Negative for cough, chest tightness, shortness of breath and wheezing.   Cardiovascular: Negative for chest pain and palpitations.  Musculoskeletal: Negative for back pain.  Skin:       Left forearm redness and warmth.   Neurological: Negative for dizziness and headaches.  Hematological: Negative for adenopathy. Does not bruise/bleed easily.  Psychiatric/Behavioral: Negative for behavioral problems and confusion.    Past Medical History  Diagnosis Date  . Chicken pox as a child  . Shingles 43  . Measles as a child  . Hyperlipidemia   . Atrial fibrillation ([place]) 2010  . Spondylisthesis 2012    L5/S1 grade 3  . Abdominal wall hernia 08/04/2012  . Ventral hernia 08/05/2012  . Varicose veins 08/05/2012    Extending up to right groin B/l LE  . DJD (degenerative joint disease) 08/05/2012  . Paroxysmal a-fib ([place])   . Other and unspecified hyperlipidemia 08/05/2012  . Depression with anxiety 12/27/2012  . Heart murmur     echo 2012  . Wears glasses   . H/O gestational diabetes mellitus, not currently pregnant 08/05/2012    History of      Social History   Social History  . Marital Status: Married    Spouse Name: N/A  . Number of Children: N/A  . Years of Education: N/A   Occupational History  . Not on file.   Social History Main Topics  . Smoking status: Never Smoker   . Smokeless tobacco: Never Used  . Alcohol Use: No  . Drug Use: No  . Sexual Activity: Not on file   Other Topics  Concern  . Not on file   Social History Narrative    Past Surgical History  Procedure Laterality Date  . Varicose vein surgery  1995    right leg  . Dilation and curettage of uterus      x4  . Colonoscopy    . Breast ductal system excision Right 05/05/2014    Procedure: RIGHT BREAST DUCT EXCISION;  Surgeon: Rolm Bookbinder, MD;  Location: [facility];  Service: General;  Laterality: Right;    Family History  Problem Relation Age of Onset  . Cancer Mother     lung- smoker  . Liver disease Mother   . Cancer Father     lung- smoker  . Peripheral Artery Disease Sister   . Dementia Maternal Grandmother   . Heart attack Maternal Grandfather   . Asthma Son   . Ulcerative colitis Son   . Asthma Son   . Ulcerative colitis Son   . Heart attack Paternal Uncle     Allergies  Allergen Reactions  . Crestor [Rosuvastatin]     Joint pain w/fibromyalgia  . Penicillins     Hands and feet swell, rash  . Pravastatin     Joint pain w/fibromyalgia    Current Outpatient Prescriptions on File Prior to Visit  Medication Sig Dispense Refill  . Ascorbic  Acid (VITAMIN C) 100 MG tablet Take 100 mg by mouth daily.    Marland Kitchen aspirin 81 MG tablet Take 81 mg by mouth daily.    Marland Kitchen atorvastatin (LIPITOR) 10 MG tablet TAKE 1/2 TABLET BY MOUTH EVERY DAY 90 tablet 1  . Calcium 200 MG TABS Take 1 tablet by mouth daily.    . cholecalciferol (VITAMIN D) 400 UNITS TABS Take 400 Units by mouth daily.    . Cholecalciferol (VITAMIN D-3) 1000 UNITS CAPS Take 2,000 Units by mouth daily.    Marland Kitchen Co-Enzyme Q-10 100 MG CAPS Take 300 mg by mouth daily.    . Docosahexaenoic Acid (DHA) 200 MG CAPS Take 1 capsule by mouth daily.    . Glucosamine-Chondroitin (GLUCOSAMINE CHONDR COMPLEX PO) Take by mouth.    . levothyroxine (LEVOTHROID) 25 MCG tablet 1-2 TABLETS AS NEEDED WEEKLY. SAME MANUFACTURE AS 88 MCG. 10 tablet 5  . levothyroxine (SYNTHROID, LEVOTHROID) 88 MCG tablet TAKE 1 TABLET (88 MCG TOTAL) BY MOUTH  DAILY BEFORE BREAKFAST. USE SAME MANUFACTURER AS 25 MCG 90 tablet 1  . Multiple Vitamin (MULTIVITAMIN) tablet Take 1 tablet by mouth daily.    . Omega-3 Fatty Acids (EPA PO) Take 300 mg by mouth daily.    . polyethylene glycol powder (GLYCOLAX/MIRALAX) powder Take 17grams twice a day. 500 g 3   No current facility-administered medications on file prior to visit.    BP 98/60 mmHg  Pulse 51  Ht 5' 4.5" (1.638 m)  Wt 145 lb (65.772 kg)  BMI 24.51 kg/m2  SpO2 98%      Objective:   Physical Exam  General- No acute distress. Pleasant patient. Lungs- Clear, even and unlabored. Heart- regular rate and rhythm. Neurologic- CNII- XII grossly intact.  Skin- Left forearm- redness, warmth and tendereness ventral aspect of forearm.  Distal wrist mild bump where mosquito bit. some rednes toward hand as well.  No dilated tender veins.  Lt upper arm- not swollen. No bruits heard.      Assessment & Plan:  You appear to have skin infection following initial probable mosquito bite.  Rx of doxycycline.(advisement given).  Area of redness should gradually decrease. If expanding or more swelling notify/return asap.  If extreme expansion then ED evaluation. In that event would need IV antibiotic.  Follow up in 4-7 days or as needed

## 2015-10-03 NOTE — Progress Notes (Signed)
Pre visit review using our clinic review tool, if applicable. No additional management support is needed unless otherwise documented below in the visit note. 

## 2015-10-03 NOTE — Patient Instructions (Addendum)
You appear to have skin infection following initial probable mosquito bite.  Rx of doxycycline.(advisement given).  Area of redness should gradually decrease. If expanding or more swelling notify/return asap.  If extreme expansion then ED evaluation. In that event would need IV antibiotic.  Follow up in 4-7 days or as needed

## 2015-10-06 ENCOUNTER — Other Ambulatory Visit: Payer: Self-pay | Admitting: Family Medicine

## 2015-10-06 DIAGNOSIS — Z1231 Encounter for screening mammogram for malignant neoplasm of breast: Secondary | ICD-10-CM

## 2015-10-10 ENCOUNTER — Other Ambulatory Visit: Payer: Self-pay | Admitting: Gastroenterology

## 2015-10-11 ENCOUNTER — Ambulatory Visit
Admission: RE | Admit: 2015-10-11 | Discharge: 2015-10-11 | Disposition: A | Discharge status: Home / Self Care | Payer: 59 | Source: Ambulatory Visit | Attending: Family Medicine | Admitting: Family Medicine

## 2015-10-11 DIAGNOSIS — Z1231 Encounter for screening mammogram for malignant neoplasm of breast: Secondary | ICD-10-CM

## 2015-10-12 ENCOUNTER — Other Ambulatory Visit: Payer: Self-pay | Admitting: Family Medicine

## 2015-10-12 ENCOUNTER — Telehealth: Payer: Self-pay | Admitting: Family Medicine

## 2015-10-12 DIAGNOSIS — R928 Other abnormal and inconclusive findings on diagnostic imaging of breast: Secondary | ICD-10-CM

## 2015-10-12 NOTE — Telephone Encounter (Signed)
Went through all my paperwork and signed many things but did not see this. Please track it down so I can sign

## 2015-10-12 NOTE — Telephone Encounter (Signed)
Form found and faxed twice to [phone] and [phone]

## 2015-10-12 NOTE — Telephone Encounter (Signed)
Caller name:Anyone that answers can assist  Can be reached:[phone]    Reason for call: [facility] has faxed an order and would like for it to be signed and returned ASAP. It was faxed this morning.

## 2015-10-13 ENCOUNTER — Ambulatory Visit
Admission: RE | Admit: 2015-10-13 | Discharge: 2015-10-13 | Disposition: A | Discharge status: Home / Self Care | Payer: 59 | Source: Ambulatory Visit | Attending: Family Medicine | Admitting: Family Medicine

## 2015-10-13 DIAGNOSIS — R928 Other abnormal and inconclusive findings on diagnostic imaging of breast: Secondary | ICD-10-CM

## 2015-11-26 ENCOUNTER — Encounter: Payer: Self-pay | Admitting: Family Medicine

## 2015-11-27 ENCOUNTER — Other Ambulatory Visit: Payer: Self-pay | Admitting: Family Medicine

## 2015-11-27 DIAGNOSIS — N950 Postmenopausal bleeding: Secondary | ICD-10-CM

## 2015-12-06 ENCOUNTER — Ambulatory Visit (INDEPENDENT_AMBULATORY_CARE_PROVIDER_SITE_OTHER): Payer: Medicare Other | Admitting: Gynecology

## 2015-12-06 ENCOUNTER — Encounter: Payer: Self-pay | Admitting: Gynecology

## 2015-12-06 VITALS — BP 132/86 | Ht 63.0 in | Wt 147.0 lb

## 2015-12-06 DIAGNOSIS — N950 Postmenopausal bleeding: Secondary | ICD-10-CM | POA: Insufficient documentation

## 2015-12-06 DIAGNOSIS — Z01419 Encounter for gynecological examination (general) (routine) without abnormal findings: Secondary | ICD-10-CM | POA: Diagnosis not present

## 2015-12-06 DIAGNOSIS — K439 Ventral hernia without obstruction or gangrene: Secondary | ICD-10-CM

## 2015-12-06 DIAGNOSIS — Z780 Asymptomatic menopausal state: Secondary | ICD-10-CM

## 2015-12-06 DIAGNOSIS — Z124 Encounter for screening for malignant neoplasm of cervix: Secondary | ICD-10-CM

## 2015-12-06 NOTE — Progress Notes (Signed)
Crystal Russell 11/21/1950 [id number]   History:    65 y.o.  for annual gyn exam who is a new patient to the practice. Patient is here also because of vaginal spotting that she had for one day 2 weeks ago. Patient brought some labs from her previous provider in [place] whereby in 2008 because of dysfunctional uterine bleeding she had a resectoscopic polypectomy/myomectomy and the pathology report indicated endometrial polyp showing complex hyperplasia without atypia and fragments of leiomyoma patient stated that she was placed on a progestational agent for several months and resample and biopsy was benign. She has never been on any hormone replacement therapy. We also reviewed previous mammograms and biopsy report. In 2013 she had a guided core needle biopsy of the left breast whereby the pathology report had stated that it was adenomyosis tumor and patient has been followed up with sonograms mammogram since then. In January 2017 Dr. Donne Hazel did a right breast ductal excision which was benign no atypia pathology report ductal ectasia. Patient also with history of a reducible ventral hernia and has not been interested in any surgery. Patient's PCP is Dr. Willette Alma who has been doing her blood work as well. Patient stated she had a bone density study [place] over 5 years ago. Patient reports no past history of any abnormal Pap smears. She also reported a benign colonoscopy approximate 5 years ago in [place] as well.   Past medical history,surgical history, family history and social history were all reviewed and documented in the EPIC chart.  Gynecologic History No LMP recorded. Patient is postmenopausal. Contraception: post menopausal status Last Pap: 2015. Results were: normal Last mammogram: See above. Results were: See above  Obstetric History OB History  Gravida Para Term Preterm AB Living  3 3       3   SAB TAB Ectopic Multiple Live Births               # Outcome Date GA Lbr  Len/2nd Weight Sex Delivery Anes PTL Lv  3 Para           2 Para           1 Para                ROS: A ROS was performed and pertinent positives and negatives are included in the history.  GENERAL: No fevers or chills. HEENT: No change in vision, no earache, sore throat or sinus congestion. NECK: No pain or stiffness. CARDIOVASCULAR: No chest pain or pressure. No palpitations. PULMONARY: No shortness of breath, cough or wheeze. GASTROINTESTINAL: No abdominal pain, nausea, vomiting or diarrhea, melena or bright red blood per rectum. GENITOURINARY: No urinary frequency, urgency, hesitancy or dysuria. MUSCULOSKELETAL: No joint or muscle pain, no back pain, no recent trauma. DERMATOLOGIC: No rash, no itching, no lesions. ENDOCRINE: No polyuria, polydipsia, no heat or cold intolerance. No recent change in weight. HEMATOLOGICAL: No anemia or easy bruising or bleeding. NEUROLOGIC: No headache, seizures, numbness, tingling or weakness. PSYCHIATRIC: No depression, no loss of interest in normal activity or change in sleep pattern.     Exam: chaperone present  BP 132/86   Ht 5\' 3"  (1.6 m)   Wt 147 lb (66.7 kg)   BMI 26.04 kg/m   Body mass index is 26.04 kg/m.  General appearance : Well developed well nourished female. No acute distress HEENT: Eyes: no retinal hemorrhage or exudates,  Neck supple, trachea midline, no carotid bruits, no thyroidmegaly Lungs:  Clear to auscultation, no rhonchi or wheezes, or rib retractions  Heart: Regular rate and rhythm, no murmurs or gallops Breast:Examined in sitting and supine position were symmetrical in appearance, no palpable masses or tenderness,  no skin retraction, no nipple inversion, no nipple discharge, no skin discoloration, no axillary or supraclavicular lymphadenopathy Abdomen: no palpable masses or tenderness, no rebound or guarding Extremities: no edema or skin discoloration or tenderness  Pelvic:  Bartholin, Urethra, Skene Glands: Within normal  limits             Vagina: No gross lesions or discharge, atrophic changes  Cervix: No gross lesions or discharge  Uterus  anteverted, normal size, shape and consistency, non-tender and mobile  Adnexa  Without masses or tenderness  Anus and perineum  normal   Rectovaginal  normal sphincter tone without palpated masses or tenderness             Hemoccult PCP provides     Assessment/Plan:  65 y.o. female for annual exam with single isolated event of postmenopausal bleeding for one day approximate 2 weeks ago. Patient with past history of endometrial polyp which was resected along with submucous myoma back in 2008 in [place] and pathology report had demonstrated endometrial polyp with complex hyperplasia without atypia and fragments of leiomyoma. She will return back to the office next week for sonohysterogram and endometrial biopsy. She will also need a bone density study in the month of October. We discussed importance of calcium vitamin D and weightbearing exercises for osteoporosis prevention. We also discussed and found out about her ventral hernia which was palpable on reducible but patient not interested in surgical correction and is asymptomatic on last "she feels full". She will check with her gastroenterologist when her last colonoscopy was because if she did have polyps and she does mention she will need screening every 5 years. Pap smear was done today.   Terrance Mass MD, 5:09 PM 12/06/2015

## 2015-12-06 NOTE — Patient Instructions (Addendum)

## 2015-12-07 ENCOUNTER — Telehealth: Payer: Self-pay

## 2015-12-07 ENCOUNTER — Other Ambulatory Visit: Payer: Self-pay | Admitting: Family Medicine

## 2015-12-07 ENCOUNTER — Encounter: Payer: Self-pay | Admitting: Family Medicine

## 2015-12-07 LAB — PAP, TP IMAGING W/ HPV RNA, RFLX HPV TYPE 16,18/45: HPV MRNA, HIGH RISK: NOT DETECTED

## 2015-12-07 MED ORDER — LEVOTHYROXINE SODIUM 88 MCG PO TABS: ORAL_TABLET | ORAL | 0 refills | Status: DC

## 2015-12-07 NOTE — Telephone Encounter (Signed)
Patient to have SHGM/endometrial biopsy. She asked should she discontinue Aspirin prior to biopsy and if so how far ahead to stop it.

## 2015-12-07 NOTE — Telephone Encounter (Signed)
2 days before

## 2015-12-07 NOTE — Telephone Encounter (Signed)
Patient informed. 

## 2015-12-11 ENCOUNTER — Encounter: Payer: Self-pay | Admitting: Family Medicine

## 2015-12-12 ENCOUNTER — Other Ambulatory Visit: Payer: Self-pay | Admitting: Gynecology

## 2015-12-12 DIAGNOSIS — N950 Postmenopausal bleeding: Secondary | ICD-10-CM

## 2015-12-18 ENCOUNTER — Ambulatory Visit (INDEPENDENT_AMBULATORY_CARE_PROVIDER_SITE_OTHER): Payer: Medicare Other | Admitting: Gynecology

## 2015-12-18 ENCOUNTER — Other Ambulatory Visit: Payer: Self-pay | Admitting: Gynecology

## 2015-12-18 ENCOUNTER — Encounter: Payer: Self-pay | Admitting: Gynecology

## 2015-12-18 ENCOUNTER — Ambulatory Visit (INDEPENDENT_AMBULATORY_CARE_PROVIDER_SITE_OTHER): Payer: Medicare Other

## 2015-12-18 DIAGNOSIS — D251 Intramural leiomyoma of uterus: Secondary | ICD-10-CM

## 2015-12-18 DIAGNOSIS — Z780 Asymptomatic menopausal state: Secondary | ICD-10-CM

## 2015-12-18 DIAGNOSIS — N950 Postmenopausal bleeding: Secondary | ICD-10-CM

## 2015-12-18 DIAGNOSIS — Z23 Encounter for immunization: Secondary | ICD-10-CM | POA: Diagnosis not present

## 2015-12-18 NOTE — Progress Notes (Signed)
   Patient is a 65 year old that presented to the office today as part of her evaluation for isolated event of postmenopausal bleeding several weeks ago for a short time which lasted 2 days. Her history that was obtained from her office visit on August 30 of this year as follows:  Patient was here as a new patient to the practice for the first time on 12/06/2015:  "Patient is here also because of vaginal spotting that she had for one day 2 weeks ago. Patient brought some labs from her previous provider in [place] whereby in 2008 because of dysfunctional uterine bleeding she had a resectoscopic polypectomy/myomectomy and the pathology report indicated endometrial polyp showing complex hyperplasia without atypia and fragments of leiomyoma patient stated that she was placed on a progestational agent for several months and resample and biopsy was benign. She has never been on any hormone replacement therapy. "  Her Pap smear was normal and she is here today for sonohysterogram and endometrial biopsy.  Ultrasound/sono hysterogram today: Uterus measures 7.5 x 4.6 x 2.9 cm with endometrial stripe of 3 mm. A small intramural fibroid measuring 14 x 15 x 21 mm was noted anterior uterine wall. Right and left ovary were normal microcalcification. No fluid in the cul-de-sac. The cervix is then cleansed with Betadine solution and a sterile catheter was introduced into the uterine cavity normal saline was instilled and no intracavitary defect was noted. Following this a sterile Pipelle was introduced into the uterine cavity requiring several passes to obtain some tissue to submit for histological evaluation.  Assessment/plan: Patient with isolated event of postmenopausal light bleed for 2 days probably atrophic endometrial bleed. Thin endometrial at 3 mm. Normal ultrasound otherwise and sonohysterogram. Minimal tissue obtained for histological evaluation will await the result. If benign To her that we'll  continue to monitor she will maintain a menstrual calendar for a year. If this becomes a repetitive problem she'll return back to the office for further evaluation once again.  An additional 10 minutes was spent after the procedure above to go over long-term course of management as well as causes for postmenopausal bleeding. Patient also requested the flu vaccine she was counseled and literature information was provided as well.

## 2015-12-26 ENCOUNTER — Ambulatory Visit (HOSPITAL_COMMUNITY): Payer: 59

## 2015-12-26 ENCOUNTER — Ambulatory Visit: Payer: 59 | Admitting: Vascular Surgery

## 2016-01-16 ENCOUNTER — Ambulatory Visit (INDEPENDENT_AMBULATORY_CARE_PROVIDER_SITE_OTHER): Payer: Medicare Other

## 2016-01-16 ENCOUNTER — Other Ambulatory Visit: Payer: Self-pay | Admitting: Gynecology

## 2016-01-16 DIAGNOSIS — M859 Disorder of bone density and structure, unspecified: Secondary | ICD-10-CM

## 2016-01-16 DIAGNOSIS — Z13820 Encounter for screening for osteoporosis: Secondary | ICD-10-CM

## 2016-01-16 DIAGNOSIS — M8580 Other specified disorders of bone density and structure, unspecified site: Secondary | ICD-10-CM

## 2016-01-16 DIAGNOSIS — Z780 Asymptomatic menopausal state: Secondary | ICD-10-CM

## 2016-01-19 ENCOUNTER — Telehealth: Payer: Self-pay | Admitting: Family Medicine

## 2016-01-19 ENCOUNTER — Other Ambulatory Visit: Payer: Self-pay | Admitting: Family Medicine

## 2016-01-19 DIAGNOSIS — E039 Hypothyroidism, unspecified: Secondary | ICD-10-CM

## 2016-01-19 NOTE — Telephone Encounter (Signed)
Ok to put thyroid panal in and cbc

## 2016-01-19 NOTE — Telephone Encounter (Signed)
Labs ordered. Patient called to schedule lab appt/done for 01/24/16 wed. am

## 2016-01-19 NOTE — Telephone Encounter (Signed)
Patient's thyroid manufacture has changed and she states that she can feel a difference. She would like to have her thyroid and cbc labs done. Please advise.    Patient phone: [phone]

## 2016-01-24 ENCOUNTER — Other Ambulatory Visit (INDEPENDENT_AMBULATORY_CARE_PROVIDER_SITE_OTHER): Payer: Medicare Other

## 2016-01-24 DIAGNOSIS — E039 Hypothyroidism, unspecified: Secondary | ICD-10-CM

## 2016-01-24 LAB — CBC WITH DIFFERENTIAL/PLATELET
BASOS PCT: 0.5 % (ref 0.0–3.0)
Basophils Absolute: 0 10*3/uL (ref 0.0–0.1)
EOS ABS: 0.2 10*3/uL (ref 0.0–0.7)
Eosinophils Relative: 3.2 % (ref 0.0–5.0)
HEMATOCRIT: 37.6 % (ref 36.0–46.0)
HEMOGLOBIN: 12.9 g/dL (ref 12.0–15.0)
LYMPHS PCT: 35.9 % (ref 12.0–46.0)
Lymphs Abs: 1.8 10*3/uL (ref 0.7–4.0)
MCHC: 34.3 g/dL (ref 30.0–36.0)
MCV: 84.6 fl (ref 78.0–100.0)
MONO ABS: 0.4 10*3/uL (ref 0.1–1.0)
Monocytes Relative: 8.3 % (ref 3.0–12.0)
Neutro Abs: 2.6 10*3/uL (ref 1.4–7.7)
Neutrophils Relative %: 52.1 % (ref 43.0–77.0)
Platelets: 270 10*3/uL (ref 150.0–400.0)
RBC: 4.44 Mil/uL (ref 3.87–5.11)
RDW: 12.6 % (ref 11.5–15.5)
WBC: 4.9 10*3/uL (ref 4.0–10.5)

## 2016-01-24 LAB — TSH: TSH: 1.04 u[IU]/mL (ref 0.35–4.50)

## 2016-02-06 ENCOUNTER — Encounter: Payer: 59 | Admitting: Family Medicine

## 2016-02-27 ENCOUNTER — Encounter (HOSPITAL_COMMUNITY): Payer: 59

## 2016-02-27 ENCOUNTER — Other Ambulatory Visit: Payer: Self-pay | Admitting: Family Medicine

## 2016-02-27 ENCOUNTER — Ambulatory Visit: Payer: 59 | Admitting: Vascular Surgery

## 2016-03-27 ENCOUNTER — Other Ambulatory Visit: Payer: Self-pay | Admitting: Gastroenterology

## 2016-03-27 NOTE — Telephone Encounter (Signed)
Just want to make sure you ar ok refilling Miralax since pt has not been seen in 1 year?

## 2016-03-27 NOTE — Telephone Encounter (Signed)
Yes that's okay

## 2016-04-08 DIAGNOSIS — I82409 Acute embolism and thrombosis of unspecified deep veins of unspecified lower extremity: Secondary | ICD-10-CM

## 2016-04-08 HISTORY — DX: Acute embolism and thrombosis of unspecified deep veins of unspecified lower extremity: I82.409

## 2016-05-14 ENCOUNTER — Ambulatory Visit (INDEPENDENT_AMBULATORY_CARE_PROVIDER_SITE_OTHER): Payer: Medicare Other | Admitting: Family Medicine

## 2016-05-14 ENCOUNTER — Encounter: Payer: Self-pay | Admitting: Family Medicine

## 2016-05-14 VITALS — BP 110/68 | HR 55 | Temp 98.2°F | Resp 16 | Ht 63.0 in | Wt 145.0 lb

## 2016-05-14 DIAGNOSIS — E782 Mixed hyperlipidemia: Secondary | ICD-10-CM

## 2016-05-14 DIAGNOSIS — D649 Anemia, unspecified: Secondary | ICD-10-CM | POA: Diagnosis not present

## 2016-05-14 DIAGNOSIS — Z0000 Encounter for general adult medical examination without abnormal findings: Secondary | ICD-10-CM

## 2016-05-14 DIAGNOSIS — E039 Hypothyroidism, unspecified: Secondary | ICD-10-CM

## 2016-05-14 NOTE — Patient Instructions (Signed)
Preventive Care 40-64 Years, Female Preventive care refers to lifestyle choices and visits with your health care provider that can promote health and wellness. What does preventive care include?  A yearly physical exam. This is also called an annual well check.  Dental exams once or twice a year.  Routine eye exams. Ask your health care provider how often you should have your eyes checked.  Personal lifestyle choices, including: ? Daily care of your teeth and gums. ? Regular physical activity. ? Eating a healthy diet. ? Avoiding tobacco and drug use. ? Limiting alcohol use. ? Practicing safe sex. ? Taking low-dose aspirin daily starting at age 50. ? Taking vitamin and mineral supplements as recommended by your health care provider. What happens during an annual well check? The services and screenings done by your health care provider during your annual well check will depend on your age, overall health, lifestyle risk factors, and family history of disease. Counseling Your health care provider may ask you questions about your:  Alcohol use.  Tobacco use.  Drug use.  Emotional well-being.  Home and relationship well-being.  Sexual activity.  Eating habits.  Work and work environment.  Method of birth control.  Menstrual cycle.  Pregnancy history.  Screening You may have the following tests or measurements:  Height, weight, and BMI.  Blood pressure.  Lipid and cholesterol levels. These may be checked every 5 years, or more frequently if you are over 50 years old.  Skin check.  Lung cancer screening. You may have this screening every year starting at age 55 if you have a 30-pack-year history of smoking and currently smoke or have quit within the past 15 years.  Fecal occult blood test (FOBT) of the stool. You may have this test every year starting at age 50.  Flexible sigmoidoscopy or colonoscopy. You may have a sigmoidoscopy every 5 years or a colonoscopy  every 10 years starting at age 50.  Hepatitis C blood test.  Hepatitis B blood test.  Sexually transmitted disease (STD) testing.  Diabetes screening. This is done by checking your blood sugar (glucose) after you have not eaten for a while (fasting). You may have this done every 1-3 years.  Mammogram. This may be done every 1-2 years. Talk to your health care provider about when you should start having regular mammograms. This may depend on whether you have a family history of breast cancer.  BRCA-related cancer screening. This may be done if you have a family history of breast, ovarian, tubal, or peritoneal cancers.  Pelvic exam and Pap test. This may be done every 3 years starting at age 21. Starting at age 30, this may be done every 5 years if you have a Pap test in combination with an HPV test.  Bone density scan. This is done to screen for osteoporosis. You may have this scan if you are at high risk for osteoporosis.  Discuss your test results, treatment options, and if necessary, the need for more tests with your health care provider. Vaccines Your health care provider may recommend certain vaccines, such as:  Influenza vaccine. This is recommended every year.  Tetanus, diphtheria, and acellular pertussis (Tdap, Td) vaccine. You may need a Td booster every 10 years.  Varicella vaccine. You may need this if you have not been vaccinated.  Zoster vaccine. You may need this after age 60.  Measles, mumps, and rubella (MMR) vaccine. You may need at least one dose of MMR if you were born in   1957 or later. You may also need a second dose.  Pneumococcal 13-valent conjugate (PCV13) vaccine. You may need this if you have certain conditions and were not previously vaccinated.  Pneumococcal polysaccharide (PPSV23) vaccine. You may need one or two doses if you smoke cigarettes or if you have certain conditions.  Meningococcal vaccine. You may need this if you have certain  conditions.  Hepatitis A vaccine. You may need this if you have certain conditions or if you travel or work in places where you may be exposed to hepatitis A.  Hepatitis B vaccine. You may need this if you have certain conditions or if you travel or work in places where you may be exposed to hepatitis B.  Haemophilus influenzae type b (Hib) vaccine. You may need this if you have certain conditions.  Talk to your health care provider about which screenings and vaccines you need and how often you need them. This information is not intended to replace advice given to you by your health care provider. Make sure you discuss any questions you have with your health care provider. Document Released: 04/21/2015 Document Revised: 12/13/2015 Document Reviewed: 01/24/2015 Elsevier Interactive Patient Education  2017 Elsevier Inc.  

## 2016-05-14 NOTE — Assessment & Plan Note (Signed)
Patient encouraged to maintain heart healthy diet, regular exercise, adequate sleep. Consider daily probiotics. Take medications as prescribed. Given and reviewed copy of ACP documents from [place] Secretary of State and encouraged to complete and return 

## 2016-05-14 NOTE — Progress Notes (Signed)
Subjective:    Patient ID: Crystal Russell, female    DOB: 1950/07/23, 66 y.o.   MRN: [id number]   I acted as a Education administrator for Dr. Ross Ludwig, LPN   Chief Complaint  Patient presents with  . Annual Exam  . Hyperlipidemia  . Hypothyroidism    Hyperlipidemia  The current episode started more than 1 year ago. The problem is controlled. Pertinent negatives include no chest pain.    Patient is in today for annual physical. She feels well today. No recent febrile illness or acute concerns. No recent hospitalizations. She is doing well with ADLs at home. She has occasional urinary incontinence with stress. She is trying to maintain a heart healthy diet and stay active. Denies CP/palp/SOB/HA/congestion/fevers/GI or GU c/o. Taking meds as prescribed  Past Medical History:  Diagnosis Date  . Abdominal wall hernia 08/04/2012  . Atrial fibrillation ([facility]) 2010  . Chicken pox as a child  . Depression with anxiety 12/27/2012  . DJD (degenerative joint disease) 08/05/2012  . H/O gestational diabetes mellitus, not currently pregnant 08/05/2012   History of   . Heart murmur    echo 2012  . Hyperlipidemia   . Measles as a child  . Other and unspecified hyperlipidemia 08/05/2012  . Paroxysmal a-fib ([facility])   . Shingles 43  . Spondylisthesis 2012   L5/S1 grade 3  . Varicose veins 08/05/2012   Extending up to right groin B/l LE  . Ventral hernia 08/05/2012  . Wears glasses     Past Surgical History:  Procedure Laterality Date  . BREAST DUCTAL SYSTEM EXCISION Right 05/05/2014   Procedure: RIGHT BREAST DUCT EXCISION;  Surgeon: Rolm Bookbinder, MD;  Location: [facility];  Service: General;  Laterality: Right;  . COLONOSCOPY    . DILATION AND CURETTAGE OF UTERUS     x4  . [place]   right leg    Family History  Problem Relation Age of Onset  . Cancer Mother     lung- smoker  . Liver disease Mother   . Cancer Father     lung- smoker  . Peripheral Artery  Disease Sister   . Dementia Maternal Grandmother   . Heart attack Maternal Grandfather   . Asthma Son   . Ulcerative colitis Son   . Asthma Son   . Ulcerative colitis Son   . Heart attack Paternal Uncle     Social History   Social History  . Marital status: Married    Spouse name: N/A  . Number of children: N/A  . Years of education: N/A   Occupational History  . Not on file.   Social History Main Topics  . Smoking status: Never Smoker  . Smokeless tobacco: Never Used  . Alcohol use No  . Drug use: No  . Sexual activity: Yes   Other Topics Concern  . Not on file   Social History Narrative  . No narrative on file    Outpatient Medications Prior to Visit  Medication Sig Dispense Refill  . Ascorbic Acid (VITAMIN C) 100 MG tablet Take 100 mg by mouth daily.    Marland Kitchen aspirin 81 MG tablet Take 81 mg by mouth daily.    Marland Kitchen atorvastatin (LIPITOR) 10 MG tablet TAKE 1/2 TABLET BY MOUTH EVERY DAY 90 tablet 1  . Calcium 200 MG TABS Take 1 tablet by mouth daily.    . cholecalciferol (VITAMIN D) 400 UNITS TABS Take 400 Units by mouth daily.    Marland Kitchen  Cholecalciferol (VITAMIN D-3) 1000 UNITS CAPS Take 2,000 Units by mouth daily.    Marland Kitchen Co-Enzyme Q-10 100 MG CAPS Take 300 mg by mouth daily.    . Docosahexaenoic Acid (DHA) 200 MG CAPS Take 1 capsule by mouth daily.    . Glucosamine-Chondroitin (GLUCOSAMINE CHONDR COMPLEX PO) Take by mouth.    . levothyroxine (SYNTHROID, LEVOTHROID) 88 MCG tablet TAKE ONE TABLET BY MOUTH ONCE DAILY BEFORE BREAKFAST 90 tablet 0  . Multiple Vitamin (MULTIVITAMIN) tablet Take 1 tablet by mouth daily.    . Omega-3 Fatty Acids (EPA PO) Take 300 mg by mouth daily.    . polyethylene glycol powder (GLYCOLAX/MIRALAX) powder DISSOLVE ONE LEVEL SCOOPFUL IN WATER & DRINK TWICE DAILY 255 g 2  . doxycycline (VIBRA-TABS) 100 MG tablet Take 1 tablet (100 mg total) by mouth 2 (two) times daily. 20 tablet 0  . levothyroxine (LEVOTHROID) 25 MCG tablet 1-2 TABLETS AS NEEDED WEEKLY.  SAME MANUFACTURE AS 88 MCG. (Patient not taking: Reported on 05/14/2016) 10 tablet 5   No facility-administered medications prior to visit.     Allergies  Allergen Reactions  . Crestor [Rosuvastatin]     Joint pain w/fibromyalgia  . Penicillins     Hands and feet swell, rash  . Pravastatin     Joint pain w/fibromyalgia    Review of Systems  Constitutional: Negative for fever.  HENT: Negative for congestion.   Eyes: Negative for blurred vision.  Respiratory: Negative for cough.   Cardiovascular: Negative for chest pain and palpitations.  Gastrointestinal: Negative for vomiting.  Genitourinary: Positive for urgency. Negative for dysuria.  Musculoskeletal: Negative for back pain.  Skin: Negative for rash.  Neurological: Negative for loss of consciousness and headaches.       Objective:    Physical Exam  Constitutional: She is oriented to person, place, and time. She appears well-developed and well-nourished. No distress.  HENT:  Head: Normocephalic and atraumatic.  Eyes: Conjunctivae are normal.  Neck: Neck supple. No thyromegaly present.  Cardiovascular: Normal rate, regular rhythm and normal heart sounds.   No murmur heard. Pulmonary/Chest: Effort normal and breath sounds normal. No respiratory distress.  Abdominal: Soft. Bowel sounds are normal. She exhibits no distension and no mass. There is no tenderness.  Musculoskeletal: She exhibits no edema.  Lymphadenopathy:    She has no cervical adenopathy.  Neurological: She is alert and oriented to person, place, and time.  Skin: Skin is warm and dry.  Psychiatric: She has a normal mood and affect. Her behavior is normal.    BP 110/68 (BP Location: Right Arm, Patient Position: Sitting, Cuff Size: Normal)   Pulse (!) 55   Temp 98.2 F (36.8 C) (Oral)   Resp 16   Ht 5\' 3"  (1.6 m)   Wt 145 lb (65.8 kg)   SpO2 98%   BMI 25.69 kg/m  Wt Readings from Last 3 Encounters:  05/14/16 145 lb (65.8 kg)  12/06/15 147 lb (66.7  kg)  10/03/15 145 lb (65.8 kg)     Lab Results  Component Value Date   WBC 4.9 01/24/2016   HGB 12.9 01/24/2016   HCT 37.6 01/24/2016   PLT 270.0 01/24/2016   GLUCOSE 90 10/26/2014   CHOL 161 10/26/2014   TRIG 57.0 10/26/2014   HDL 60.00 10/26/2014   LDLCALC 90 10/26/2014   ALT 26 10/26/2014   AST 32 10/26/2014   NA 135 10/26/2014   K 4.4 10/26/2014   CL 100 10/26/2014   CREATININE 0.68 10/26/2014  BUN 14 10/26/2014   CO2 32 10/26/2014   TSH 1.04 01/24/2016   HGBA1C 5.5 10/26/2014    Lab Results  Component Value Date   TSH 1.04 01/24/2016   Lab Results  Component Value Date   WBC 4.9 01/24/2016   HGB 12.9 01/24/2016   HCT 37.6 01/24/2016   MCV 84.6 01/24/2016   PLT 270.0 01/24/2016   Lab Results  Component Value Date   NA 135 10/26/2014   K 4.4 10/26/2014   CO2 32 10/26/2014   GLUCOSE 90 10/26/2014   BUN 14 10/26/2014   CREATININE 0.68 10/26/2014   BILITOT 1.0 10/26/2014   ALKPHOS 58 10/26/2014   AST 32 10/26/2014   ALT 26 10/26/2014   PROT 6.3 10/26/2014   ALBUMIN 4.0 10/26/2014   CALCIUM 9.3 10/26/2014   ANIONGAP 10 05/03/2014   GFR 92.61 10/26/2014   Lab Results  Component Value Date   CHOL 161 10/26/2014   Lab Results  Component Value Date   HDL 60.00 10/26/2014   Lab Results  Component Value Date   LDLCALC 90 10/26/2014   Lab Results  Component Value Date   TRIG 57.0 10/26/2014   Lab Results  Component Value Date   CHOLHDL 3 10/26/2014   Lab Results  Component Value Date   HGBA1C 5.5 10/26/2014       Assessment & Plan:   Problem List Items Addressed This Visit    Hyperlipidemia, mixed   Relevant Orders   Lipid panel   Preventative health care    Patient encouraged to maintain heart healthy diet, regular exercise, adequate sleep. Consider daily probiotics. Take medications as prescribed. Given and reviewed copy of ACP documents from [employer] and encouraged to complete and return      Relevant Orders    Lipid panel   CBC   Comprehensive metabolic panel   TSH   Hypothyroidism - Primary   Relevant Orders   TSH    Other Visit Diagnoses    Anemia, unspecified type       Relevant Orders   CBC      I have discontinued Ms. Reitan's doxycycline. I am also having her maintain her aspirin, Co-Enzyme Q-10, Glucosamine-Chondroitin (GLUCOSAMINE CHONDR COMPLEX PO), vitamin C, multivitamin, Vitamin D-3, cholecalciferol, Calcium, Omega-3 Fatty Acids (EPA PO), DHA, atorvastatin, levothyroxine, and polyethylene glycol powder.  No orders of the defined types were placed in this encounter.   CMA served as Education administrator during this visit. History, Physical and Plan performed by medical provider. Documentation and orders reviewed and attested to.  Penni Homans, MD Patient ID: Crystal Russell, female   DOB: 1950-08-26, 66 y.o.   MRN: [id number]

## 2016-05-14 NOTE — Progress Notes (Signed)
Pre visit review using our clinic review tool, if applicable. No additional management support is needed unless otherwise documented below in the visit note. 

## 2016-05-24 ENCOUNTER — Other Ambulatory Visit: Payer: Self-pay | Admitting: Gastroenterology

## 2016-05-24 NOTE — Telephone Encounter (Signed)
Yes that's fine. Thanks  

## 2016-05-24 NOTE — Telephone Encounter (Signed)
Are you ok to send in Miralax?

## 2016-06-06 ENCOUNTER — Other Ambulatory Visit: Payer: Self-pay | Admitting: Family Medicine

## 2016-07-12 ENCOUNTER — Telehealth: Payer: Self-pay | Admitting: Family Medicine

## 2016-07-12 NOTE — Telephone Encounter (Signed)
Caller name: Gwyndolyn Saxon Relationship to patient: Husband Can be reached: [phone]  Pharmacy:  Reason for call: Husband states that patient was billed incorrectly for her CPE. States provider had to resubmit one for him and now it's the same issue with his wife. Plse adv

## 2016-07-14 NOTE — Telephone Encounter (Signed)
I reviewed my charting I only pilled a preventative check up nothing else I did not bill anything else. Please check and see what his insurance company is doing.

## 2016-07-16 NOTE — Telephone Encounter (Signed)
Informed the patients husband informed of PCP instructions. He stated the same thing happened to his visit Crystal Russell 07/26/1949) both have medicare. His wife is being charged $325  He said you change his coding and the charge was then dropped. He recently received a notice from our office no charges.   So he does want this same thing done to his wife's office visit.

## 2016-07-16 NOTE — Telephone Encounter (Signed)
I have sent this to charge correction .  CPE was denied because she is straight medicare.  I had corrected to [id number] and refiled to insurance. Thanks, [employer]

## 2016-07-16 NOTE — Telephone Encounter (Signed)
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

## 2016-07-16 NOTE — Telephone Encounter (Signed)
thanks

## 2016-07-17 ENCOUNTER — Other Ambulatory Visit: Payer: Self-pay | Admitting: Family Medicine

## 2016-07-17 DIAGNOSIS — N6310 Unspecified lump in the right breast, unspecified quadrant: Secondary | ICD-10-CM

## 2016-07-23 ENCOUNTER — Ambulatory Visit
Admission: RE | Admit: 2016-07-23 | Discharge: 2016-07-23 | Disposition: A | Discharge status: Home / Self Care | Payer: Medicare Other | Source: Ambulatory Visit | Attending: Family Medicine | Admitting: Family Medicine

## 2016-07-23 DIAGNOSIS — N6310 Unspecified lump in the right breast, unspecified quadrant: Secondary | ICD-10-CM

## 2016-07-23 DIAGNOSIS — N6489 Other specified disorders of breast: Secondary | ICD-10-CM | POA: Diagnosis not present

## 2016-07-23 DIAGNOSIS — R922 Inconclusive mammogram: Secondary | ICD-10-CM | POA: Diagnosis not present

## 2016-07-30 ENCOUNTER — Other Ambulatory Visit: Payer: Self-pay | Admitting: Gastroenterology

## 2016-08-21 ENCOUNTER — Encounter: Payer: Self-pay | Admitting: Gynecology

## 2016-09-12 ENCOUNTER — Telehealth: Payer: Self-pay | Admitting: Family Medicine

## 2016-09-24 ENCOUNTER — Other Ambulatory Visit: Payer: Self-pay | Admitting: Family Medicine

## 2016-10-06 ENCOUNTER — Other Ambulatory Visit: Payer: Self-pay | Admitting: Gastroenterology

## 2016-10-06 ENCOUNTER — Other Ambulatory Visit: Payer: Self-pay | Admitting: Family Medicine

## 2016-10-07 NOTE — Telephone Encounter (Signed)
Pts last visit was in 2016. Is Miralax ok to refill?

## 2016-10-10 NOTE — Telephone Encounter (Signed)
Yes please give 3 month supply with 3 refills. thanks

## 2016-11-28 ENCOUNTER — Other Ambulatory Visit: Payer: Self-pay | Admitting: Family Medicine

## 2016-11-28 DIAGNOSIS — N63 Unspecified lump in unspecified breast: Secondary | ICD-10-CM

## 2016-12-04 ENCOUNTER — Ambulatory Visit
Admission: RE | Admit: 2016-12-04 | Discharge: 2016-12-04 | Disposition: A | Discharge status: Home / Self Care | Payer: Medicare Other | Source: Ambulatory Visit | Attending: Family Medicine | Admitting: Family Medicine

## 2016-12-04 DIAGNOSIS — R922 Inconclusive mammogram: Secondary | ICD-10-CM | POA: Diagnosis not present

## 2016-12-04 DIAGNOSIS — N6489 Other specified disorders of breast: Secondary | ICD-10-CM | POA: Diagnosis not present

## 2016-12-04 DIAGNOSIS — N63 Unspecified lump in unspecified breast: Secondary | ICD-10-CM

## 2017-02-19 ENCOUNTER — Ambulatory Visit (INDEPENDENT_AMBULATORY_CARE_PROVIDER_SITE_OTHER): Payer: Medicare Other

## 2017-02-19 DIAGNOSIS — Z23 Encounter for immunization: Secondary | ICD-10-CM

## 2017-02-26 DIAGNOSIS — M25561 Pain in right knee: Secondary | ICD-10-CM | POA: Diagnosis not present

## 2017-02-28 ENCOUNTER — Ambulatory Visit: Payer: Self-pay

## 2017-02-28 NOTE — Telephone Encounter (Signed)
Pt. called to report tender, firm area on left lower leg, approx. 2 " below the knee on inner aspect.  Stated there is no redness or warmth.  Denied any swelling of the extremity, or calf pain.  Reported she has been standing alot, over past 2 days, with the Thanksgiving holiday.   But, also reported, "due to right knee pain, with bone on bone", she has been sitting more than normal, prior to the activity for Thanksgiving. Voiced concern for a blood clot in the left leg.  Stated she has bad varicose veins.  Reported she has had a left LE superficial thrombophlebitis in the past. Stated she doesn't usually wear her compression stockings, but did wear them yesterday, due to the discomfort. Care advice per protocol.  Advised that the protocol recommends her to be seen within 24 hrs.  Advised to go to UC.  Informed that if she needs an ultrasound of her left leg to r/o DVT, the UC wouldn't have that capability.  Verb. understanding. Pt. stated she may go to the ER.              Reason for Disposition . Localized pain, redness or hard lump along vein  Answer Assessment - Initial Assessment Questions 1. ONSET: "When did the pain start?"      Wed. evening 2. LOCATION: "Where is the pain located?"      Left lower leg, 2 " from knee on inner aspect 3. PAIN: "How bad is the pain?"    (Scale 1-10; or mild, moderate, severe)   -  MILD (1-3): doesn't interfere with normal activities    -  MODERATE (4-7): interferes with normal activities (e.g., work or school) or awakens from sleep, limping    -  SEVERE (8-10): excruciating pain, unable to do any normal activities, unable to walk     Moderate when going from sitting to standing; then decreases to mild 4. WORK OR EXERCISE: "Has there been any recent work or exercise that involved this part of the body?"      no 5. CAUSE: "What do you think is causing the leg pain?"     Concerned it could be a blood clot  6. OTHER SYMPTOMS: "Do you have any other symptoms?"  (e.g., chest pain, back pain, breathing difficulty, swelling, rash, fever, numbness, weakness)     No visible swelling, no redness, no warmth 7. PREGNANCY: "Is there any chance you are pregnant?" "When was your last menstrual period?"     no  Protocols used: LEG PAIN-A-AH

## 2017-03-01 ENCOUNTER — Encounter (HOSPITAL_COMMUNITY): Payer: Self-pay | Admitting: Emergency Medicine

## 2017-03-01 ENCOUNTER — Emergency Department (HOSPITAL_BASED_OUTPATIENT_CLINIC_OR_DEPARTMENT_OTHER)
Admit: 2017-03-01 | Discharge: 2017-03-01 | Disposition: A | Discharge status: Home / Self Care | Payer: Medicare Other | Attending: Emergency Medicine | Admitting: Emergency Medicine

## 2017-03-01 ENCOUNTER — Emergency Department (HOSPITAL_COMMUNITY)
Admission: EM | Admit: 2017-03-01 | Discharge: 2017-03-01 | Disposition: A | Discharge status: Home / Self Care | Payer: Medicare Other | Attending: Emergency Medicine | Admitting: Emergency Medicine

## 2017-03-01 DIAGNOSIS — I82819 Embolism and thrombosis of superficial veins of unspecified lower extremities: Secondary | ICD-10-CM | POA: Diagnosis not present

## 2017-03-01 DIAGNOSIS — I8002 Phlebitis and thrombophlebitis of superficial vessels of left lower extremity: Secondary | ICD-10-CM

## 2017-03-01 DIAGNOSIS — I80292 Phlebitis and thrombophlebitis of other deep vessels of left lower extremity: Secondary | ICD-10-CM | POA: Diagnosis not present

## 2017-03-01 DIAGNOSIS — M79609 Pain in unspecified limb: Secondary | ICD-10-CM | POA: Diagnosis not present

## 2017-03-01 DIAGNOSIS — Z79899 Other long term (current) drug therapy: Secondary | ICD-10-CM | POA: Insufficient documentation

## 2017-03-01 DIAGNOSIS — M79662 Pain in left lower leg: Secondary | ICD-10-CM | POA: Diagnosis present

## 2017-03-01 DIAGNOSIS — Z7982 Long term (current) use of aspirin: Secondary | ICD-10-CM | POA: Diagnosis not present

## 2017-03-01 DIAGNOSIS — E039 Hypothyroidism, unspecified: Secondary | ICD-10-CM | POA: Diagnosis not present

## 2017-03-01 NOTE — ED Triage Notes (Signed)
Pt comes in with complaints of left leg pain since Thursday.  Concerned for DVT. No recent surgeries. Denies blood thinner use but does take baby aspirin. Very minimal soft tissue swelling around calf. Pain increases as patient goes from lying to standing.  Varicose veins noted on calf.

## 2017-03-01 NOTE — ED Provider Notes (Signed)
[facility] DEPT Provider Note   CSN: [id number] Arrival date & time: 03/01/17  1410     History   Chief Complaint Chief Complaint  Patient presents with  . Leg Pain  . Rule Out DVT    HPI Crystal Russell is a 66 y.o. female.  She presents for evaluation of a nodule on the left lower leg to make sure that she does not have  a blood clot.  Prior episode of superficial thrombophlebitis but no prior history of DVT.  No fever, chills, shortness of breath, chest pain, nausea, vomiting, weakness or dizziness.  There are no other known modifying factors.  HPI  Past Medical History:  Diagnosis Date  . Abdominal wall hernia 08/04/2012  . Atrial fibrillation ([place]) 2010  . Chicken pox as a child  . Depression with anxiety 12/27/2012  . DJD (degenerative joint disease) 08/05/2012  . H/O gestational diabetes mellitus, not currently pregnant 08/05/2012   History of   . Heart murmur    echo 2012  . Hyperlipidemia   . Measles as a child  . Other and unspecified hyperlipidemia 08/05/2012  . Paroxysmal A-fib ([place])   . Shingles 43  . Spondylisthesis 2012   L5/S1 grade 3  . Varicose veins 08/05/2012   Extending up to right groin B/l LE  . Ventral hernia 08/05/2012  . Wears glasses     Patient Active Problem List   Diagnosis Date Noted  . Postmenopausal bleeding 12/06/2015  . Varicose veins of bilateral lower extremities with other complications 98/33/8250  . Sun-damaged skin 12/04/2014  . Breast mass, left 05/16/2014  . Varicose veins of lower extremities with other complications 53/97/6734  . Nipple discharge 11/05/2013  . Preventative health care 08/08/2013  . Hypothyroidism 08/08/2013  . Atrophic vaginitis 08/08/2013  . Cervical cancer screening 08/05/2013  . ETD (eustachian tube dysfunction) 05/26/2013  . Depression with anxiety 12/27/2012  . Ventral hernia 08/05/2012  . Varicose veins 08/05/2012  . DJD (degenerative joint disease) 08/05/2012  .  Unspecified constipation 08/05/2012  . Hyperlipidemia, mixed 08/05/2012  . H/O gestational diabetes mellitus, not currently pregnant 08/05/2012  . Chicken pox   . Shingles   . Paroxysmal A-fib ([place])   . Heart murmur   . Spondylisthesis     Past Surgical History:  Procedure Laterality Date  . BREAST DUCTAL SYSTEM EXCISION Right 05/05/2014   Procedure: RIGHT BREAST DUCT EXCISION;  Surgeon: Rolm Bookbinder, MD;  Location: [facility];  Service: General;  Laterality: Right;  . COLONOSCOPY    . DILATION AND CURETTAGE OF UTERUS     x4  . [place]   right leg    OB History    Gravida Para Term Preterm AB Living   3 3       3    SAB TAB Ectopic Multiple Live Births                   Home Medications    Prior to Admission medications   Medication Sig Start Date End Date Taking? Authorizing Provider  Ascorbic Acid (VITAMIN C) 100 MG tablet Take 100 mg by mouth daily.    [provider]  aspirin 81 MG tablet Take 81 mg by mouth daily.    [provider]  atorvastatin (LIPITOR) 10 MG tablet TAKE ONE-HALF TABLET BY MOUTH EVERY DAY 09/24/16   Mosie Lukes, MD  Calcium 200 MG TABS Take 1 tablet by mouth daily.  [provider]  cholecalciferol (VITAMIN D) 400 UNITS TABS Take 400 Units by mouth daily.    [provider]  Cholecalciferol (VITAMIN D-3) 1000 UNITS CAPS Take 2,000 Units by mouth daily.    [provider]  Co-Enzyme Q-10 100 MG CAPS Take 300 mg by mouth daily.    [provider]  Docosahexaenoic Acid (DHA) 200 MG CAPS Take 1 capsule by mouth daily.    [provider]  Glucosamine-Chondroitin (GLUCOSAMINE CHONDR COMPLEX PO) Take by mouth.    [provider]  levothyroxine (SYNTHROID, LEVOTHROID) 88 MCG tablet TAKE 1 TABLET BY MOUTH ONCE DAILY BEFORE BREAKFAST 10/07/16   Mosie Lukes, MD  Multiple Vitamin (MULTIVITAMIN) tablet Take 1 tablet by mouth daily.    [provider]  Omega-3 Fatty Acids (EPA PO) Take 300 mg by mouth daily.    [provider]  polyethylene glycol powder (GLYCOLAX/MIRALAX) powder DISSOLVE ONE LEVEL SCOOPFUL OF POWDER IN WATER & DRINK TWICE DAILY 10/10/16   Armbruster, Carlota Raspberry, MD    Family History Family History  Problem Relation Age of Onset  . Cancer Mother        lung- smoker  . Liver disease Mother   . Cancer Father        lung- smoker  . Peripheral Artery Disease Sister   . Dementia Maternal Grandmother   . Heart attack Maternal Grandfather   . Asthma Son   . Ulcerative colitis Son   . Asthma Son   . Ulcerative colitis Son   . Heart attack Paternal Uncle     Social History Social History   Tobacco Use  . Smoking status: Never Smoker  . Smokeless tobacco: Never Used  Substance Use Topics  . Alcohol use: No  . Drug use: No     Allergies   Crestor [rosuvastatin]; Penicillins; and Pravastatin   Review of Systems Review of Systems  All other systems reviewed and are negative.    Physical Exam Updated Vital Signs BP 137/77 (BP Location: Right Arm)   Pulse 63   Temp 98.1 F (36.7 C) (Oral)   Resp 16   Ht 5\' 5"  (1.651 m)   Wt 68 kg (150 lb)   SpO2 100%   BMI 24.96 kg/m   Physical Exam  Constitutional: She is oriented to person, place, and time. She appears well-developed and well-nourished.  HENT:  Head: Normocephalic and atraumatic.  Eyes: Conjunctivae and EOM are normal. Pupils are equal, round, and reactive to light.  Neck: Normal range of motion and phonation normal. Neck supple.  Cardiovascular: Normal rate.  Pulmonary/Chest: Effort normal.  Musculoskeletal: Normal range of motion.  Tender nodule left proximal tibia region approximately 3 cm, somewhat indistinct, with very mild overlying erythema.  No tenderness to the calf or popliteal regions of the left leg.  Neurovascular intact distally in the left foot.  Neurological: She is alert and oriented to person, place, and  time. She exhibits normal muscle tone.  Skin: Skin is warm and dry.  Psychiatric: She has a normal mood and affect. Her behavior is normal. Judgment and thought content normal.  Nursing note and vitals reviewed.    ED Treatments / Results  Labs (all labs ordered are listed, but only abnormal results are displayed) Labs Reviewed - No data to display  EKG  EKG Interpretation None       Radiology No results found.  Procedures Procedures (including critical care time)  Medications Ordered in ED Medications - No  data to display   Initial Impression / Assessment and Plan / ED Course  I have reviewed the triage vital signs and the nursing notes.  Pertinent labs & imaging results that were available during my care of the patient were reviewed by me and considered in my medical decision making (see chart for details).      Patient Vitals for the past 24 hrs:  BP Temp Temp src Pulse Resp SpO2 Height Weight  03/01/17 1420 137/77 98.1 F (36.7 C) Oral 63 16 100 % 5\' 5"  (1.651 m) 68 kg (150 lb)      Final Clinical Impressions(s) / ED Diagnoses   Final diagnoses:  Thrombophlebitis of superficial veins of left lower extremity    Nonspecific left leg pain.  Doppler imaging ordered to evaluate for deep venous thrombosis.  Nursing Notes Reviewed/ Care Coordinated Applicable Imaging Reviewed Interpretation of Laboratory Data incorporated into ED treatment   Care to Dr. Tyrone Nine to evaluate after imaging.  ED Discharge Orders    None       Daleen Bo, MD 03/02/17 [phone]

## 2017-03-01 NOTE — Progress Notes (Signed)
VASCULAR LAB PRELIMINARY  PRELIMINARY  PRELIMINARY  PRELIMINARY  Left lower extremity venous duplex completed.    Preliminary report:  There is no DVT  noted in the left lower extremity.  There is superficial thrombophlebitis noted in varicosities throughout the shin and in the popliteal fossa.   Gave report to Dr. Darnell Level, [facility], RVT 03/01/2017, 5:53 PM

## 2017-03-01 NOTE — ED Provider Notes (Signed)
66 yo F with focal leg pain and swelling.  Likely a superficial thrombosis.  Received in signout from Dr. Eulis Foster.  DVT confirms diagnosis.  Discharge home.   Deno Etienne, DO 03/01/17 2003

## 2017-03-01 NOTE — Discharge Instructions (Signed)
Follow up with your PCP.  Typically the drug of choice for this is an NSAID(ibuprofen, naproxyn) if your doctor lets you take this medicine.

## 2017-03-03 ENCOUNTER — Other Ambulatory Visit: Payer: Self-pay | Admitting: Gastroenterology

## 2017-03-03 NOTE — Telephone Encounter (Signed)
Error

## 2017-03-05 DIAGNOSIS — M25561 Pain in right knee: Secondary | ICD-10-CM | POA: Diagnosis not present

## 2017-03-06 DIAGNOSIS — I824Z2 Acute embolism and thrombosis of unspecified deep veins of left distal lower extremity: Secondary | ICD-10-CM | POA: Diagnosis not present

## 2017-03-06 DIAGNOSIS — I8002 Phlebitis and thrombophlebitis of superficial vessels of left lower extremity: Secondary | ICD-10-CM | POA: Diagnosis not present

## 2017-03-13 DIAGNOSIS — M25561 Pain in right knee: Secondary | ICD-10-CM | POA: Diagnosis not present

## 2017-03-20 DIAGNOSIS — M25561 Pain in right knee: Secondary | ICD-10-CM | POA: Diagnosis not present

## 2017-03-31 DIAGNOSIS — M1711 Unilateral primary osteoarthritis, right knee: Secondary | ICD-10-CM | POA: Diagnosis not present

## 2017-03-31 DIAGNOSIS — M6281 Muscle weakness (generalized): Secondary | ICD-10-CM | POA: Diagnosis not present

## 2017-03-31 DIAGNOSIS — M25561 Pain in right knee: Secondary | ICD-10-CM | POA: Diagnosis not present

## 2017-04-02 ENCOUNTER — Other Ambulatory Visit: Payer: Self-pay | Admitting: Family Medicine

## 2017-04-03 DIAGNOSIS — M6281 Muscle weakness (generalized): Secondary | ICD-10-CM | POA: Diagnosis not present

## 2017-04-03 DIAGNOSIS — M1711 Unilateral primary osteoarthritis, right knee: Secondary | ICD-10-CM | POA: Diagnosis not present

## 2017-04-03 DIAGNOSIS — M25561 Pain in right knee: Secondary | ICD-10-CM | POA: Diagnosis not present

## 2017-04-09 DIAGNOSIS — I8002 Phlebitis and thrombophlebitis of superficial vessels of left lower extremity: Secondary | ICD-10-CM | POA: Diagnosis not present

## 2017-04-09 DIAGNOSIS — I8312 Varicose veins of left lower extremity with inflammation: Secondary | ICD-10-CM | POA: Diagnosis not present

## 2017-04-18 DIAGNOSIS — M6281 Muscle weakness (generalized): Secondary | ICD-10-CM | POA: Diagnosis not present

## 2017-04-18 DIAGNOSIS — M25561 Pain in right knee: Secondary | ICD-10-CM | POA: Diagnosis not present

## 2017-04-18 DIAGNOSIS — M1711 Unilateral primary osteoarthritis, right knee: Secondary | ICD-10-CM | POA: Diagnosis not present

## 2017-04-21 ENCOUNTER — Other Ambulatory Visit: Payer: Self-pay | Admitting: Family Medicine

## 2017-04-30 DIAGNOSIS — I8002 Phlebitis and thrombophlebitis of superficial vessels of left lower extremity: Secondary | ICD-10-CM | POA: Diagnosis not present

## 2017-04-30 DIAGNOSIS — I8312 Varicose veins of left lower extremity with inflammation: Secondary | ICD-10-CM | POA: Diagnosis not present

## 2017-05-01 ENCOUNTER — Other Ambulatory Visit: Payer: Self-pay

## 2017-05-01 ENCOUNTER — Encounter (HOSPITAL_COMMUNITY): Payer: Self-pay | Admitting: Emergency Medicine

## 2017-05-01 ENCOUNTER — Emergency Department (HOSPITAL_COMMUNITY)
Admission: EM | Admit: 2017-05-01 | Discharge: 2017-05-02 | Disposition: A | Discharge status: Home / Self Care | Payer: Medicare Other | Attending: Emergency Medicine | Admitting: Emergency Medicine

## 2017-05-01 DIAGNOSIS — Z7982 Long term (current) use of aspirin: Secondary | ICD-10-CM | POA: Insufficient documentation

## 2017-05-01 DIAGNOSIS — Z79899 Other long term (current) drug therapy: Secondary | ICD-10-CM | POA: Insufficient documentation

## 2017-05-01 DIAGNOSIS — I4891 Unspecified atrial fibrillation: Secondary | ICD-10-CM | POA: Insufficient documentation

## 2017-05-01 DIAGNOSIS — M179 Osteoarthritis of knee, unspecified: Secondary | ICD-10-CM | POA: Diagnosis not present

## 2017-05-01 DIAGNOSIS — M25461 Effusion, right knee: Secondary | ICD-10-CM | POA: Insufficient documentation

## 2017-05-01 DIAGNOSIS — M25561 Pain in right knee: Secondary | ICD-10-CM | POA: Diagnosis present

## 2017-05-01 NOTE — ED Triage Notes (Signed)
Pt states she is having pain in her right knee  Pt states the pain initially started 3 days after she received a cortisone injection in it   Pt states she was seen by her dr and they put her in a knee immobilizer and then a week later she started PT  Pt states she was improving until today and then the pain started getting worse and she is having spasms in it

## 2017-05-02 ENCOUNTER — Emergency Department (HOSPITAL_COMMUNITY): Payer: Medicare Other

## 2017-05-02 DIAGNOSIS — M25461 Effusion, right knee: Secondary | ICD-10-CM | POA: Diagnosis not present

## 2017-05-02 DIAGNOSIS — M179 Osteoarthritis of knee, unspecified: Secondary | ICD-10-CM | POA: Diagnosis not present

## 2017-05-02 MED ORDER — HYDROCODONE-ACETAMINOPHEN 5-325 MG PO TABS: 1.0000 | ORAL_TABLET | Freq: Four times a day (QID) | ORAL | 0 refills | Status: DC | PRN

## 2017-05-02 MED ORDER — CYCLOBENZAPRINE HCL 10 MG PO TABS: 10.0000 mg | ORAL_TABLET | Freq: Two times a day (BID) | ORAL | 0 refills | Status: DC | PRN

## 2017-05-02 MED ORDER — HYDROCODONE-ACETAMINOPHEN 5-325 MG PO TABS
2.0000 | ORAL_TABLET | Freq: Once | ORAL | Status: AC
  Administered 2017-05-02: 2 via ORAL
  Filled 2017-05-02: qty 2

## 2017-05-02 NOTE — Discharge Instructions (Signed)
Please keep the leg up and elevated throughout the day.  Stay off of it as much as possible.  Use the crutches.  Take pain medications as directed.

## 2017-05-02 NOTE — ED Provider Notes (Signed)
[facility] DEPT Provider Note   CSN: [id number] Arrival date & time: 05/01/17  2154     History   Chief Complaint Chief Complaint  Patient presents with  . Knee Pain    HPI Crystal Russell is a 67 y.o. female.  Patient presents to the emergency department with chief complaint of right knee pain.  She states that she has not had an injury, but has noticed swelling about her right knee.  She states that it felt tight, so she tried to walk on it.  She states that it has become more swollen over the past day.  She reports having had similar symptoms in the past, and used ice and compression and had good relief.  She reports having been seen by orthopedics and having a cortisone injection done.  She states that she is currently taking Eliquis for lower extremity DVT.  She denies any other associated symptoms.   The history is provided by the patient. No language interpreter was used.    Past Medical History:  Diagnosis Date  . Abdominal wall hernia 08/04/2012  . Atrial fibrillation ([place]) 2010  . Chicken pox as a child  . Depression with anxiety 12/27/2012  . DJD (degenerative joint disease) 08/05/2012  . H/O gestational diabetes mellitus, not currently pregnant 08/05/2012   History of   . Heart murmur    echo 2012  . Hyperlipidemia   . Measles as a child  . Other and unspecified hyperlipidemia 08/05/2012  . Paroxysmal A-fib ([place])   . Shingles 43  . Spondylisthesis 2012   L5/S1 grade 3  . Varicose veins 08/05/2012   Extending up to right groin B/l LE  . Ventral hernia 08/05/2012  . Wears glasses     Patient Active Problem List   Diagnosis Date Noted  . Postmenopausal bleeding 12/06/2015  . Varicose veins of bilateral lower extremities with other complications 89/21/1941  . Sun-damaged skin 12/04/2014  . Breast mass, left 05/16/2014  . Varicose veins of lower extremities with other complications 74/11/1446  . Nipple discharge 11/05/2013  .  Preventative health care 08/08/2013  . Hypothyroidism 08/08/2013  . Atrophic vaginitis 08/08/2013  . Cervical cancer screening 08/05/2013  . ETD (eustachian tube dysfunction) 05/26/2013  . Depression with anxiety 12/27/2012  . Ventral hernia 08/05/2012  . Varicose veins 08/05/2012  . DJD (degenerative joint disease) 08/05/2012  . Unspecified constipation 08/05/2012  . Hyperlipidemia, mixed 08/05/2012  . H/O gestational diabetes mellitus, not currently pregnant 08/05/2012  . Chicken pox   . Shingles   . Paroxysmal A-fib ([place])   . Heart murmur   . Spondylisthesis     Past Surgical History:  Procedure Laterality Date  . BREAST DUCTAL SYSTEM EXCISION Right 05/05/2014   Procedure: RIGHT BREAST DUCT EXCISION;  Surgeon: Rolm Bookbinder, MD;  Location: [facility];  Service: General;  Laterality: Right;  . COLONOSCOPY    . DILATION AND CURETTAGE OF UTERUS     x4  . [place]   right leg    OB History    Gravida Para Term Preterm AB Living   3 3       3    SAB TAB Ectopic Multiple Live Births                   Home Medications    Prior to Admission medications   Medication Sig Start Date End Date Taking? Authorizing Provider  aspirin 81 MG tablet Take 81 mg  by mouth daily.    [provider]  atorvastatin (LIPITOR) 10 MG tablet TAKE 1/2 (ONE-HALF) TABLET BY MOUTH ONCE DAILY 04/02/17   Mosie Lukes, MD  Cholecalciferol (VITAMIN D-3) 1000 UNITS CAPS Take 2,000 Units by mouth daily.    [provider]  Co-Enzyme Q-10 100 MG CAPS Take 300 mg by mouth daily.    [provider]  levothyroxine (SYNTHROID, LEVOTHROID) 88 MCG tablet TAKE 1 TABLET BY MOUTH ONCE DAILY BEFORE BREAKFAST 04/21/17   Mosie Lukes, MD  Multiple Vitamin (MULTIVITAMIN) tablet Take 1 tablet by mouth daily.    [provider]  Omega-3 Fatty Acids (EPA PO) Take 300 mg by mouth daily.    [provider]  polyethylene glycol powder  (GLYCOLAX/MIRALAX) powder DISSOLVE 1 LEVEL SCOOPFUL OF POWDER IN WATER AND DRINK TWICE DAILY 03/03/17   Armbruster, Carlota Raspberry, MD    Family History Family History  Problem Relation Age of Onset  . Cancer Mother        lung- smoker  . Liver disease Mother   . Cancer Father        lung- smoker  . Peripheral Artery Disease Sister   . Dementia Maternal Grandmother   . Heart attack Maternal Grandfather   . Asthma Son   . Ulcerative colitis Son   . Asthma Son   . Ulcerative colitis Son   . Heart attack Paternal Uncle     Social History Social History   Tobacco Use  . Smoking status: Never Smoker  . Smokeless tobacco: Never Used  Substance Use Topics  . Alcohol use: No  . Drug use: No     Allergies   Crestor [rosuvastatin]; Pravastatin; and Penicillins   Review of Systems Review of Systems  All other systems reviewed and are negative.    Physical Exam Updated Vital Signs BP (!) 145/82 (BP Location: Left Arm)   Pulse 65   Temp 98.5 F (36.9 C) (Oral)   Resp 20   SpO2 100%   Physical Exam Nursing note and vitals reviewed.  Constitutional: Pt appears well-developed and well-nourished. No distress.  HENT:  Head: Normocephalic and atraumatic.  Eyes: Conjunctivae are normal.  Neck: Normal range of motion.  Cardiovascular: Normal rate, regular rhythm. Intact distal pulses.   Capillary refill < 3 sec.  Pulmonary/Chest: Effort normal and breath sounds normal.  Musculoskeletal:  Right knee Pt exhibits palpable effusion to the right knee.   ROM: limited by pain  Strength: limited by pain  Neurological: Pt  is alert. Coordination normal.  Sensation: 5/5 Skin: Skin is warm and dry. Pt is not diaphoretic.  No evidence of open wound or skin tenting Psychiatric: Pt has a normal mood and affect.     ED Treatments / Results  Labs (all labs ordered are listed, but only abnormal results are displayed) Labs Reviewed - No data to display  EKG  EKG  Interpretation None       Radiology Dg Knee Complete 4 Views Right  Result Date: 05/02/2017 CLINICAL DATA:  Right knee injection 3 days ago. Increasing pain beginning about 24 hours ago. Knee is swollen and painful. Unable to bear weight. EXAM: RIGHT KNEE - COMPLETE 4+ VIEW COMPARISON:  None. FINDINGS: Degenerative changes in the right knee with slight medial compartment narrowing and tricompartment osteophyte formation. No evidence of acute fracture or dislocation. There is a large right knee effusion. Calcified loose bodies are present in the suprapatellar and patellofemoral spaces. IMPRESSION: Degenerative changes in the  right knee. No acute fracture or dislocation. Large effusion. Calcified loose bodies are present. Electronically Signed   By: Lucienne Capers M.D.   On: 05/02/2017 01:16    Procedures Procedures (including critical care time)  Medications Ordered in ED Medications  HYDROcodone-acetaminophen (NORCO/VICODIN) 5-325 MG per tablet 2 tablet (not administered)     Initial Impression / Assessment and Plan / ED Course  I have reviewed the triage vital signs and the nursing notes.  Pertinent labs & imaging results that were available during my care of the patient were reviewed by me and considered in my medical decision making (see chart for details).     Patient with large right-sided knee effusion.  Presumably hemarthrosis.  Patient's vital signs are stable.  She has no overlying erythema.  Doubt septic joint.  No history of gout.  She is currently taking Eliquis.  I offered therapeutic knee aspiration, she declined.  Will give Ace wrap for better compression, crutches, and pain medication.  Recommend orthopedic follow-up.  Final Clinical Impressions(s) / ED Diagnoses   Final diagnoses:  Effusion of right knee    ED Discharge Orders        Ordered    HYDROcodone-acetaminophen (NORCO/VICODIN) 5-325 MG tablet  Every 6 hours PRN     05/02/17 0148       Montine Circle, PA-C 05/02/17 0148    Sherwood Gambler, MD 05/02/17 [phone]

## 2017-05-13 DIAGNOSIS — M79605 Pain in left leg: Secondary | ICD-10-CM | POA: Diagnosis not present

## 2017-05-13 DIAGNOSIS — I8312 Varicose veins of left lower extremity with inflammation: Secondary | ICD-10-CM | POA: Diagnosis not present

## 2017-05-16 ENCOUNTER — Other Ambulatory Visit (INDEPENDENT_AMBULATORY_CARE_PROVIDER_SITE_OTHER): Payer: Medicare Other

## 2017-05-16 ENCOUNTER — Other Ambulatory Visit: Payer: Medicare Other

## 2017-05-16 DIAGNOSIS — D649 Anemia, unspecified: Secondary | ICD-10-CM

## 2017-05-16 DIAGNOSIS — E782 Mixed hyperlipidemia: Secondary | ICD-10-CM

## 2017-05-16 DIAGNOSIS — Z0000 Encounter for general adult medical examination without abnormal findings: Secondary | ICD-10-CM | POA: Diagnosis not present

## 2017-05-16 DIAGNOSIS — E039 Hypothyroidism, unspecified: Secondary | ICD-10-CM

## 2017-05-16 LAB — COMPREHENSIVE METABOLIC PANEL
ALT: 16 U/L (ref 0–35)
AST: 17 U/L (ref 0–37)
Albumin: 3.9 g/dL (ref 3.5–5.2)
Alkaline Phosphatase: 52 U/L (ref 39–117)
BUN: 19 mg/dL (ref 6–23)
CO2: 33 mEq/L — ABNORMAL HIGH (ref 19–32)
Calcium: 9.7 mg/dL (ref 8.4–10.5)
Chloride: 103 mEq/L (ref 96–112)
Creatinine, Ser: 0.65 mg/dL (ref 0.40–1.20)
GFR: 96.79 mL/min (ref >60.00)
Glucose, Bld: 99 mg/dL (ref 70–99)
Potassium: 4.8 mEq/L (ref 3.5–5.1)
Sodium: 139 mEq/L (ref 135–145)
Total Bilirubin: 0.9 mg/dL (ref 0.2–1.2)
Total Protein: 6.7 g/dL (ref 6.0–8.3)

## 2017-05-16 LAB — CBC
HEMATOCRIT: 37.8 % (ref 35.0–45.0)
HEMOGLOBIN: 12.7 g/dL (ref 11.7–15.5)
MCH: 28.9 pg (ref 27.0–33.0)
MCHC: 33.6 g/dL (ref 32.0–36.0)
MCV: 86.1 fL (ref 80.0–100.0)
MPV: 9.6 fL (ref 7.5–12.5)
Platelets: 281 10*3/uL (ref 140–400)
RBC: 4.39 10*6/uL (ref 3.80–5.10)
RDW: 12.2 % (ref 11.0–15.0)
WBC: 5.1 10*3/uL (ref 3.8–10.8)

## 2017-05-16 LAB — TSH: TSH: 2.91 u[IU]/mL (ref 0.35–4.50)

## 2017-05-16 LAB — LIPID PANEL
CHOLESTEROL: 175 mg/dL (ref 0–200)
HDL: 49.7 mg/dL (ref >39.00)
LDL Cholesterol: 111 mg/dL — ABNORMAL HIGH (ref 0–99)
NonHDL: 124.85
TRIGLYCERIDES: 69 mg/dL (ref 0.0–149.0)
Total CHOL/HDL Ratio: 4
VLDL: 13.8 mg/dL (ref 0.0–40.0)

## 2017-05-16 NOTE — Progress Notes (Deleted)
Subjective:   Crystal Russell is a 67 y.o. female who presents for an Initial Medicare Annual Wellness Visit. The Patient was informed that the wellness visit is to identify future health risk and educate and initiate measures that can reduce risk for increased disease through the lifespan.   Describes health as fair, good or great?  Review of Systems   No ROS.  Medicare Wellness Visit. Additional risk factors are reflected in the social history.   Sleep patterns:  Home Safety/Smoke Alarms: Feels safe in home. Smoke alarms in place.  Living environment; residence and Firearm Safety:  [place] Safety/Bike Helmet: Wears seat belt.   Female:   Pap- last 12/06/15-normal       Mammo- last 12/04/16: BI-RADS CATEGORY  2: Benign.      Dexa scan-  Last 01/16/16: osteopenia   CCS- last 07/14/08. Recall 5 yrs Objective:    There were no vitals filed for this visit. There is no height or weight on file to calculate BMI.  Advanced Directives 05/01/2017 03/01/2017 05/05/2014 05/02/2014 12/08/2013  Does Patient Have a Medical Advance Directive? No No No Yes Yes  Type of Advance Directive - - - Living will -  Does patient want to make changes to medical advance directive? - - - No - Patient declined -  Copy of [place] in Chart? - - - - No - copy requested  Would patient like information on creating a medical advance directive? No - Patient declined - - - -    Current Medications (verified) Outpatient Encounter Medications as of 05/20/2017  Medication Sig  . aspirin 81 MG tablet Take 81 mg by mouth daily.  Marland Kitchen atorvastatin (LIPITOR) 10 MG tablet TAKE 1/2 (ONE-HALF) TABLET BY MOUTH ONCE DAILY  . Cholecalciferol (VITAMIN D-3) 1000 UNITS CAPS Take 2,000 Units by mouth daily.  Marland Kitchen Co-Enzyme Q-10 100 MG CAPS Take 300 mg by mouth daily.  . cyclobenzaprine (FLEXERIL) 10 MG tablet Take 1 tablet (10 mg total) by mouth 2 (two) times daily as needed for muscle spasms.  Marland Kitchen HYDROcodone-acetaminophen  (NORCO/VICODIN) 5-325 MG tablet Take 1-2 tablets by mouth every 6 (six) hours as needed.  Marland Kitchen levothyroxine (SYNTHROID, LEVOTHROID) 88 MCG tablet TAKE 1 TABLET BY MOUTH ONCE DAILY BEFORE BREAKFAST  . Multiple Vitamin (MULTIVITAMIN) tablet Take 1 tablet by mouth daily.  . Omega-3 Fatty Acids (EPA PO) Take 300 mg by mouth daily.  . polyethylene glycol powder (GLYCOLAX/MIRALAX) powder DISSOLVE 1 LEVEL SCOOPFUL OF POWDER IN WATER AND DRINK TWICE DAILY   No facility-administered encounter medications on file as of 05/20/2017.     Allergies (verified) Crestor [rosuvastatin]; Pravastatin; and Penicillins   History: Past Medical History:  Diagnosis Date  . Abdominal wall hernia 08/04/2012  . Atrial fibrillation ([place]) 2010  . Chicken pox as a child  . Depression with anxiety 12/27/2012  . DJD (degenerative joint disease) 08/05/2012  . H/O gestational diabetes mellitus, not currently pregnant 08/05/2012   History of   . Heart murmur    echo 2012  . Hyperlipidemia   . Measles as a child  . Other and unspecified hyperlipidemia 08/05/2012  . Paroxysmal A-fib ([place])   . Shingles 43  . Spondylisthesis 2012   L5/S1 grade 3  . Varicose veins 08/05/2012   Extending up to right groin B/l LE  . Ventral hernia 08/05/2012  . Wears glasses    Past Surgical History:  Procedure Laterality Date  . BREAST DUCTAL SYSTEM EXCISION Right 05/05/2014  Procedure: RIGHT BREAST DUCT EXCISION;  Surgeon: Rolm Bookbinder, MD;  Location: [facility];  Service: General;  Laterality: Right;  . COLONOSCOPY    . DILATION AND CURETTAGE OF UTERUS     x4  . [employer]   right leg   Family History  Problem Relation Age of Onset  . Cancer Mother        lung- smoker  . Liver disease Mother   . Cancer Father        lung- smoker  . Peripheral Artery Disease Sister   . Dementia Maternal Grandmother   . Heart attack Maternal Grandfather   . Asthma Son   . Ulcerative colitis Son   . Asthma  Son   . Ulcerative colitis Son   . Heart attack Paternal Uncle    Social History   Socioeconomic History  . Marital status: Married    Spouse name: Not on file  . Number of children: Not on file  . Years of education: Not on file  . Highest education level: Not on file  Social Needs  . Financial resource strain: Not on file  . Food insecurity - worry: Not on file  . Food insecurity - inability: Not on file  . Transportation needs - medical: Not on file  . Transportation needs - non-medical: Not on file  Occupational History  . Not on file  Tobacco Use  . Smoking status: Never Smoker  . Smokeless tobacco: Never Used  Substance and Sexual Activity  . Alcohol use: No  . Drug use: No  . Sexual activity: Yes  Other Topics Concern  . Not on file  Social History Narrative  . Not on file    Tobacco Counseling Counseling given: Not Answered   Clinical Intake:                        Activities of Daily Living No flowsheet data found.   Immunizations and Health Maintenance Immunization History  Administered Date(s) Administered  . Influenza, High Dose Seasonal PF 02/19/2017  . Influenza,inj,Quad PF,6+ Mos 12/25/2012, 01/12/2014, 01/25/2015, 12/18/2015  . Zoster 09/06/2012   Health Maintenance Due  Topic Date Due  . Hepatitis C Screening  May 02, 1950  . FOOT EXAM  11/29/1960  . URINE MICROALBUMIN  11/29/1960  . TETANUS/TDAP  11/29/1969  . OPHTHALMOLOGY EXAM  04/09/2015  . HEMOGLOBIN A1C  04/28/2015  . PNA vac Low Risk Adult (1 of 2 - PCV13) 11/30/2015    Patient Care Team: Mosie Lukes, MD as PCP - General (Family Medicine)  Indicate any recent Medical Services you may have received from other than [facility] providers in the past year (date may be approximate).     Assessment:   This is a routine wellness examination for Crystal Russell. Physical assessment deferred to PCP.  Hearing/Vision screen No exam data present  Dietary issues and exercise  activities discussed:   Diet (meal preparation, eat out, water intake, caffeinated beverages, dairy products, fruits and vegetables): {Desc; diets:16563} Breakfast: Lunch:  Dinner:      Goals    None     Depression Screen PHQ 2/9 Scores 05/14/2016  PHQ - 2 Score 0    Fall Risk Fall Risk  05/14/2016  Falls in the past year? No    Cognitive Function:        Screening Tests Health Maintenance  Topic Date Due  . Hepatitis C Screening  1950-05-05  . FOOT EXAM  11/29/1960  . URINE MICROALBUMIN  11/29/1960  . TETANUS/TDAP  11/29/1969  . OPHTHALMOLOGY EXAM  04/09/2015  . HEMOGLOBIN A1C  04/28/2015  . PNA vac Low Risk Adult (1 of 2 - PCV13) 11/30/2015  . COLONOSCOPY  07/15/2018  . MAMMOGRAM  12/05/2018  . INFLUENZA VACCINE  Completed  . DEXA SCAN  Completed      Plan:   ***  I have personally reviewed and noted the following in the patient's chart:   . Medical and social history . Use of alcohol, tobacco or illicit drugs  . Current medications and supplements . Functional ability and status . Nutritional status . Physical activity . Advanced directives . List of other physicians . Hospitalizations, surgeries, and ER visits in previous 12 months . Vitals . Screenings to include cognitive, depression, and falls . Referrals and appointments  In addition, I have reviewed and discussed with patient certain preventive protocols, quality metrics, and best practice recommendations. A written personalized care plan for preventive services as well as general preventive health recommendations were provided to patient.     Shela Nevin, [place]   05/16/2017

## 2017-05-20 ENCOUNTER — Ambulatory Visit: Payer: Medicare Other | Admitting: *Deleted

## 2017-05-20 ENCOUNTER — Ambulatory Visit: Payer: Medicare Other | Admitting: Family Medicine

## 2017-05-21 DIAGNOSIS — M25561 Pain in right knee: Secondary | ICD-10-CM | POA: Diagnosis not present

## 2017-05-21 DIAGNOSIS — M1711 Unilateral primary osteoarthritis, right knee: Secondary | ICD-10-CM | POA: Diagnosis not present

## 2017-05-30 DIAGNOSIS — M25561 Pain in right knee: Secondary | ICD-10-CM | POA: Diagnosis not present

## 2017-06-02 ENCOUNTER — Encounter: Payer: Self-pay | Admitting: Family Medicine

## 2017-06-09 NOTE — Progress Notes (Signed)
Please place orders in Epic as patient is being scheduled for a pre-op appointment! Thank you! 

## 2017-06-16 ENCOUNTER — Ambulatory Visit (INDEPENDENT_AMBULATORY_CARE_PROVIDER_SITE_OTHER): Payer: Medicare Other | Admitting: Family Medicine

## 2017-06-16 ENCOUNTER — Ambulatory Visit: Payer: Medicare Other | Admitting: *Deleted

## 2017-06-16 VITALS — BP 102/64 | HR 53 | Temp 98.3°F | Resp 18 | Wt 152.0 lb

## 2017-06-16 DIAGNOSIS — E782 Mixed hyperlipidemia: Secondary | ICD-10-CM | POA: Diagnosis not present

## 2017-06-16 DIAGNOSIS — F418 Other specified anxiety disorders: Secondary | ICD-10-CM

## 2017-06-16 DIAGNOSIS — M25561 Pain in right knee: Secondary | ICD-10-CM

## 2017-06-16 DIAGNOSIS — E039 Hypothyroidism, unspecified: Secondary | ICD-10-CM | POA: Diagnosis not present

## 2017-06-16 DIAGNOSIS — M009 Pyogenic arthritis, unspecified: Secondary | ICD-10-CM | POA: Insufficient documentation

## 2017-06-16 NOTE — Patient Instructions (Signed)
Joint Pain Joint pain can be caused by many things. The joint can be bruised, infected, weak from aging, or sore from exercise. The pain will probably go away if you follow your doctor's instructions for home care. If your joint pain continues, more tests may be needed to help find the cause of your condition. Follow these instructions at home: Watch your condition for any changes. Follow these instructions as told to lessen the pain that you are feeling:  Take medicines only as told by your doctor.  Rest the sore joint for as long as told by your doctor. If your doctor tells you to, raise (elevate) the painful joint above the level of your heart while you are sitting or lying down.  Do not do things that cause pain or make the pain worse.  If told, put ice on the painful area: ? Put ice in a plastic bag. ? Place a towel between your skin and the bag. ? Leave the ice on for 20 minutes, 2-3 times per day.  Wear an elastic bandage, splint, or sling as told by your doctor. Loosen the bandage or splint if your fingers or toes lose feeling (become numb) and tingle, or if they turn cold and blue.  Begin exercising or stretching the joint as told by your doctor. Ask your doctor what types of exercise are safe for you.  Keep all follow-up visits as told by your doctor. This is important.  Contact a doctor if:  Your pain gets worse and medicine does not help it.  Your joint pain does not get better in 3 days.  You have more bruising or swelling.  You have a fever.  You lose 10 pounds (4.5 kg) or more without trying. Get help right away if:  You are not able to move the joint.  Your fingers or toes become numb or they turn cold and blue. This information is not intended to replace advice given to you by your health care provider. Make sure you discuss any questions you have with your health care provider. Document Released: 03/13/2009 Document Revised: 08/31/2015 Document Reviewed:  01/04/2014 Elsevier Interactive Patient Education  2018 Elsevier Inc.  

## 2017-06-16 NOTE — Assessment & Plan Note (Signed)
She acknowledges being very anxious about her upcoming surgery but declines any meds at this time

## 2017-06-16 NOTE — Assessment & Plan Note (Signed)
long history of DJD and pain. Then sudden flare in past few months. She is now planning on a TKR on 07/01/2017 with Dr Alvan Dame. She is nervous about surgery but anxious to proceed due to severity of pain and debility. She is aware not to start any new OTC meds prior to surgery and to not take any NSAIDs prior to surgery. She is medically stable and is cleared for surgery. She will report any new concerns for consideration

## 2017-06-16 NOTE — Assessment & Plan Note (Signed)
On Levothyroxine, continue to monitor. TSH wnl

## 2017-06-16 NOTE — Progress Notes (Signed)
Subjective:  I acted as a Education administrator for Dr. Charlett Blake. [place], [place]  Patient ID: Crystal Russell, female    DOB: 03-25-1951, 67 y.o.   MRN: [id number]  No chief complaint on file.   HPI  Patient is in today for a pre op clearance visit. She is ready to proceed with a TKR on right knee with Dr Alvan Dame of [facility]. Her surgeon is Dr Alvan Dame. She feels well except for her knee pain which has caused disability for months. She has significant swelling as well. No warmth or redness. No falls but she is much less active. Denies CP/palp/SOB/HA/congestion/fevers/GI or GU c/o. Taking meds as prescribed  Patient Care Team: Mosie Lukes, MD as PCP - General (Family Medicine)   Past Medical History:  Diagnosis Date  . Abdominal wall hernia 08/04/2012  . Atrial fibrillation ([place]) 2010  . Chicken pox as a child  . Depression with anxiety 12/27/2012  . DJD (degenerative joint disease) 08/05/2012  . H/O gestational diabetes mellitus, not currently pregnant 08/05/2012   History of   . Heart murmur    echo 2012  . Hyperlipidemia   . Measles as a child  . Other and unspecified hyperlipidemia 08/05/2012  . Paroxysmal A-fib ([place])   . Shingles 43  . Spondylisthesis 2012   L5/S1 grade 3  . Varicose veins 08/05/2012   Extending up to right groin B/l LE  . Ventral hernia 08/05/2012  . Wears glasses     Past Surgical History:  Procedure Laterality Date  . BREAST DUCTAL SYSTEM EXCISION Right 05/05/2014   Procedure: RIGHT BREAST DUCT EXCISION;  Surgeon: Rolm Bookbinder, MD;  Location: [facility];  Service: General;  Laterality: Right;  . COLONOSCOPY    . DILATION AND CURETTAGE OF UTERUS     x4  . [place]   right leg    Family History  Problem Relation Age of Onset  . Cancer Mother        lung- smoker  . Liver disease Mother   . Cancer Father        lung- smoker  . Peripheral Artery Disease Sister   . Dementia Maternal Grandmother   . Heart attack Maternal  Grandfather   . Asthma Son   . Ulcerative colitis Son   . Asthma Son   . Ulcerative colitis Son   . Heart attack Paternal Uncle     Social History   Socioeconomic History  . Marital status: Married    Spouse name: Not on file  . Number of children: Not on file  . Years of education: Not on file  . Highest education level: Not on file  Social Needs  . Financial resource strain: Not on file  . Food insecurity - worry: Not on file  . Food insecurity - inability: Not on file  . Transportation needs - medical: Not on file  . Transportation needs - non-medical: Not on file  Occupational History  . Not on file  Tobacco Use  . Smoking status: Never Smoker  . Smokeless tobacco: Never Used  Substance and Sexual Activity  . Alcohol use: No  . Drug use: No  . Sexual activity: Yes  Other Topics Concern  . Not on file  Social History Narrative  . Not on file    Outpatient Medications Prior to Visit  Medication Sig Dispense Refill  . aspirin 81 MG tablet Take 81 mg by mouth daily.    Marland Kitchen atorvastatin (LIPITOR) 10  MG tablet TAKE 1/2 (ONE-HALF) TABLET BY MOUTH ONCE DAILY (Patient taking differently: TAKE 5 MG BY MOUTH ONCE DAILY) 45 tablet 1  . Co-Enzyme Q-10 100 MG CAPS Take 300 mg by mouth daily.    Marland Kitchen levothyroxine (SYNTHROID, LEVOTHROID) 88 MCG tablet TAKE 1 TABLET BY MOUTH ONCE DAILY BEFORE BREAKFAST (Patient taking differently: TAKE 88 MCG BY MOUTH ONCE DAILY BEFORE BREAKFAST) 90 tablet 1  . polyethylene glycol powder (GLYCOLAX/MIRALAX) powder DISSOLVE 1 LEVEL SCOOPFUL OF POWDER IN WATER AND DRINK TWICE DAILY 850 g 3  . Cholecalciferol (VITAMIN D-3) 1000 UNITS CAPS Take 2,000 Units by mouth daily.    . cyclobenzaprine (FLEXERIL) 10 MG tablet Take 1 tablet (10 mg total) by mouth 2 (two) times daily as needed for muscle spasms. 20 tablet 0  . HYDROcodone-acetaminophen (NORCO/VICODIN) 5-325 MG tablet Take 1-2 tablets by mouth every 6 (six) hours as needed. 10 tablet 0  . Multiple  Vitamin (MULTIVITAMIN) tablet Take 1 tablet by mouth daily.    . Omega-3 Fatty Acids (EPA PO) Take 300 mg by mouth daily.     No facility-administered medications prior to visit.     Allergies  Allergen Reactions  . Crestor [Rosuvastatin] Other (See Comments)    Joint pain w/fibromyalgia  . Pravastatin Other (See Comments)    Joint pain w/fibromyalgia  . Penicillins Swelling, Rash and Other (See Comments)    Hands and feet swell, rash; childhood allergy  Has patient had a PCN reaction causing immediate rash, facial/tongue/throat swelling, SOB or lightheadedness with hypotension: Unknown Has patient had a PCN reaction causing severe rash involving mucus membranes or skin necrosis: Unknown Has patient had a PCN reaction that required hospitalization: Yes Has patient had a PCN reaction occurring within the last 10 years: No If all of the above answers are "NO", then may proceed with Cephalosporin use.     Review of Systems  Constitutional: Negative for fever and malaise/fatigue.  HENT: Negative for congestion.   Eyes: Negative for blurred vision.  Respiratory: Negative for shortness of breath.   Cardiovascular: Negative for chest pain, palpitations and leg swelling.  Gastrointestinal: Negative for abdominal pain, blood in stool and nausea.  Genitourinary: Negative for dysuria and frequency.  Musculoskeletal: Positive for joint pain. Negative for falls.  Skin: Negative for rash.  Neurological: Negative for dizziness, loss of consciousness and headaches.  Endo/Heme/Allergies: Negative for environmental allergies.  Psychiatric/Behavioral: Negative for depression. The patient is nervous/anxious.        Objective:    Physical Exam  Constitutional: She is oriented to person, place, and time. She appears well-developed and well-nourished. No distress.  HENT:  Head: Normocephalic and atraumatic.  Eyes: Conjunctivae are normal.  Neck: Neck supple. No thyromegaly present.    Cardiovascular: Normal rate, regular rhythm and normal heart sounds.  No murmur heard. Pulmonary/Chest: Effort normal and breath sounds normal. No respiratory distress.  Abdominal: Soft. Bowel sounds are normal. She exhibits no distension and no mass. There is no tenderness.  Musculoskeletal: She exhibits edema.  Right>left knee swollen diffusely  Lymphadenopathy:    She has no cervical adenopathy.  Neurological: She is alert and oriented to person, place, and time.  Skin: Skin is warm and dry.  Psychiatric: She has a normal mood and affect. Her behavior is normal.    BP 102/64 (BP Location: Left Arm, Patient Position: Sitting, Cuff Size: Normal)   Pulse (!) 53   Temp 98.3 F (36.8 C) (Oral)   Resp 18   Wt 152 lb (  68.9 kg)   SpO2 97%   BMI 25.29 kg/m  Wt Readings from Last 3 Encounters:  06/16/17 152 lb (68.9 kg)  03/01/17 150 lb (68 kg)  05/14/16 145 lb (65.8 kg)   BP Readings from Last 3 Encounters:  06/16/17 102/64  05/01/17 (!) 145/82  03/01/17 130/75     Immunization History  Administered Date(s) Administered  . Influenza, High Dose Seasonal PF 02/19/2017  . Influenza,inj,Quad PF,6+ Mos 12/25/2012, 01/12/2014, 01/25/2015, 12/18/2015  . Zoster 09/06/2012    Health Maintenance  Topic Date Due  . Hepatitis C Screening  [birth year]  . FOOT EXAM  11/29/1960  . URINE MICROALBUMIN  11/29/1960  . TETANUS/TDAP  11/29/1969  . OPHTHALMOLOGY EXAM  04/09/2015  . HEMOGLOBIN A1C  04/28/2015  . PNA vac Low Risk Adult (1 of 2 - PCV13) 11/30/2015  . COLONOSCOPY  07/15/2018  . MAMMOGRAM  12/05/2018  . INFLUENZA VACCINE  Completed  . DEXA SCAN  Completed    Lab Results  Component Value Date   WBC 5.1 05/16/2017   HGB 12.7 05/16/2017   HCT 37.8 05/16/2017   PLT 281 05/16/2017   GLUCOSE 99 05/16/2017   CHOL 175 05/16/2017   TRIG 69.0 05/16/2017   HDL 49.70 05/16/2017   LDLCALC 111 (H) 05/16/2017   ALT 16 05/16/2017   AST 17 05/16/2017   NA 139 05/16/2017   K 4.8  05/16/2017   CL 103 05/16/2017   CREATININE 0.65 05/16/2017   BUN 19 05/16/2017   CO2 33 (H) 05/16/2017   TSH 2.91 05/16/2017   HGBA1C 5.5 10/26/2014    Lab Results  Component Value Date   TSH 2.91 05/16/2017   Lab Results  Component Value Date   WBC 5.1 05/16/2017   HGB 12.7 05/16/2017   HCT 37.8 05/16/2017   MCV 86.1 05/16/2017   PLT 281 05/16/2017   Lab Results  Component Value Date   NA 139 05/16/2017   K 4.8 05/16/2017   CO2 33 (H) 05/16/2017   GLUCOSE 99 05/16/2017   BUN 19 05/16/2017   CREATININE 0.65 05/16/2017   BILITOT 0.9 05/16/2017   ALKPHOS 52 05/16/2017   AST 17 05/16/2017   ALT 16 05/16/2017   PROT 6.7 05/16/2017   ALBUMIN 3.9 05/16/2017   CALCIUM 9.7 05/16/2017   ANIONGAP 10 05/03/2014   GFR 96.79 05/16/2017   Lab Results  Component Value Date   CHOL 175 05/16/2017   Lab Results  Component Value Date   HDL 49.70 05/16/2017   Lab Results  Component Value Date   LDLCALC 111 (H) 05/16/2017   Lab Results  Component Value Date   TRIG 69.0 05/16/2017   Lab Results  Component Value Date   CHOLHDL 4 05/16/2017   Lab Results  Component Value Date   HGBA1C 5.5 10/26/2014         Assessment & Plan:   Problem List Items Addressed This Visit    Hyperlipidemia, mixed    Tolerating statin, encouraged heart healthy diet, avoid trans fats, minimize simple carbs and saturated fats. Increase exercise as tolerated      Depression with anxiety    She acknowledges being very anxious about her upcoming surgery but declines any meds at this time      Hypothyroidism - Primary    On Levothyroxine, continue to monitor. TSH wnl      Relevant Orders   TSH   T4, free   T3, free   Right knee pain    long  history of DJD and pain. Then sudden flare in past few months. She is now planning on a TKR on 07/01/2017 with Dr Alvan Dame. She is nervous about surgery but anxious to proceed due to severity of pain and debility. She is aware not to start any new  OTC meds prior to surgery and to not take any NSAIDs prior to surgery. She is medically stable and is cleared for surgery. She will report any new concerns for consideration         I have discontinued Ivin Booty Facundo's multivitamin, Vitamin D-3, Omega-3 Fatty Acids (EPA PO), HYDROcodone-acetaminophen, and cyclobenzaprine. I am also having her maintain her aspirin, Co-Enzyme Q-10, polyethylene glycol powder, atorvastatin, and levothyroxine.  No orders of the defined types were placed in this encounter.   CMA served as Education administrator during this visit. History, Physical and Plan performed by medical provider. Documentation and orders reviewed and attested to.  Penni Homans, MD

## 2017-06-16 NOTE — Assessment & Plan Note (Signed)
Tolerating statin, encouraged heart healthy diet, avoid trans fats, minimize simple carbs and saturated fats. Increase exercise as tolerated 

## 2017-06-16 NOTE — Assessment & Plan Note (Signed)
Follows with  [facility]. She had a DVT lat 2018 and was on Eliquis for 2+ months but then suffered an effusion in right knee so it was stopped after DVT resolution was confirmed. No further blood thinner presently. She will continue ECASA and discuss anticoagulation after surgery with surgeon

## 2017-06-17 ENCOUNTER — Ambulatory Visit: Payer: Medicare Other | Admitting: Family Medicine

## 2017-06-23 ENCOUNTER — Encounter (HOSPITAL_COMMUNITY): Payer: Self-pay

## 2017-06-23 NOTE — Pre-Procedure Instructions (Signed)
Dr. Charlett Blake last office visit note and surgical clearance is in epic 06/16/2017. Dr. Charlett Blake surgical clearance also in patient's hard chart.

## 2017-06-23 NOTE — Patient Instructions (Addendum)
Your procedure is scheduled on:  Tuesday, July 01, 2017   Surgery Time:  10:05AM-11:15AM   Report to [facility] Main  Entrance    Report to admitting at 7:30 AM   Call this number if you have problems the morning of surgery [phone]   Do not eat food or drink liquids :After Midnight.    Take these medicines the morning of surgery with A SIP OF WATER:  Levothyroxine                               You may not have any metal on your body including hair pins, jewelry, and body piercings             Do not wear make-up, lotions, powders, perfumes/cologne, or deodorant             Do not wear nail polish.  Do not shave  48 hours prior to surgery.           Do not bring valuables to the hospital. [place].   Contacts, dentures or bridgework may not be worn into surgery.   Leave suitcase in the car. After surgery it may be brought to your room.               Please read over the following fact sheets you were given:  [facility] - Preparing for Surgery Before surgery, you can play an important role.  Because skin is not sterile, your skin needs to be as free of germs as possible.  You can reduce the number of germs on your skin by washing with CHG (chlorahexidine gluconate) soap before surgery.  CHG is an antiseptic cleaner which kills germs and bonds with the skin to continue killing germs even after washing. Please DO NOT use if you have an allergy to CHG or antibacterial soaps.  If your skin becomes reddened/irritated stop using the CHG and inform your nurse when you arrive at Short Stay. Do not shave (including legs and underarms) for at least 48 hours prior to the first CHG shower.  You may shave your face/neck.  Please follow these instructions carefully:  1.  Shower with CHG Soap the night before surgery and the  morning of surgery.  2.  If you choose to wash your hair, wash your hair first as usual with your  normal  shampoo.  3.  After you shampoo, rinse your hair and body thoroughly to remove the shampoo.                             4.  Use CHG as you would any other liquid soap.  You can apply chg directly to the skin and wash.  Gently with a scrungie or clean washcloth.  5.  Apply the CHG Soap to your body ONLY FROM THE NECK DOWN.   Do   not use on face/ open                           Wound or open sores. Avoid contact with eyes, ears mouth and   genitals (private parts).  Wash face,  Genitals (private parts) with your normal soap.             6.  Wash thoroughly, paying special attention to the area where your    surgery  will be performed.  7.  Thoroughly rinse your body with warm water from the neck down.  8.  DO NOT shower/wash with your normal soap after using and rinsing off the CHG Soap.                9.  Pat yourself dry with a clean towel.            10.  Wear clean pajamas.            11.  Place clean sheets on your bed the night of your first shower and do not  sleep with pets. Day of Surgery : Do not apply any lotions/deodorants the morning of surgery.  Please wear clean clothes to the hospital/surgery center.  FAILURE TO FOLLOW THESE INSTRUCTIONS MAY RESULT IN THE CANCELLATION OF YOUR SURGERY  PATIENT SIGNATURE_________________________________  NURSE SIGNATURE__________________________________  ________________________________________________________________________   Crystal Russell  An incentive spirometer is a tool that can help keep your lungs clear and active. This tool measures how well you are filling your lungs with each breath. Taking long deep breaths may help reverse or decrease the chance of developing breathing (pulmonary) problems (especially infection) following:  A long period of time when you are unable to move or be active. BEFORE THE PROCEDURE   If the spirometer includes an indicator to show your best effort, your nurse or  respiratory therapist will set it to a desired goal.  If possible, sit up straight or lean slightly forward. Try not to slouch.  Hold the incentive spirometer in an upright position. INSTRUCTIONS FOR USE  1. Sit on the edge of your bed if possible, or sit up as far as you can in bed or on a chair. 2. Hold the incentive spirometer in an upright position. 3. Breathe out normally. 4. Place the mouthpiece in your mouth and seal your lips tightly around it. 5. Breathe in slowly and as deeply as possible, raising the piston or the ball toward the top of the column. 6. Hold your breath for 3-5 seconds or for as long as possible. Allow the piston or ball to fall to the bottom of the column. 7. Remove the mouthpiece from your mouth and breathe out normally. 8. Rest for a few seconds and repeat Steps 1 through 7 at least 10 times every 1-2 hours when you are awake. Take your time and take a few normal breaths between deep breaths. 9. The spirometer may include an indicator to show your best effort. Use the indicator as a goal to work toward during each repetition. 10. After each set of 10 deep breaths, practice coughing to be sure your lungs are clear. If you have an incision (the cut made at the time of surgery), support your incision when coughing by placing a pillow or rolled up towels firmly against it. Once you are able to get out of bed, walk around indoors and cough well. You may stop using the incentive spirometer when instructed by your caregiver.  RISKS AND COMPLICATIONS  Take your time so you do not get dizzy or light-headed.  If you are in pain, you may need to take or ask for pain medication before doing incentive spirometry. It is harder to take a deep breath if you  are having pain. AFTER USE  Rest and breathe slowly and easily.  It can be helpful to keep track of a log of your progress. Your caregiver can provide you with a simple table to help with this. If you are using the  spirometer at home, follow these instructions: [place] IF:   You are having difficultly using the spirometer.  You have trouble using the spirometer as often as instructed.  Your pain medication is not giving enough relief while using the spirometer.  You develop fever of 100.5 F (38.1 C) or higher. SEEK IMMEDIATE MEDICAL CARE IF:   You cough up bloody sputum that had not been present before.  You develop fever of 102 F (38.9 C) or greater.  You develop worsening pain at or near the incision site. MAKE SURE YOU:   Understand these instructions.  Will watch your condition.  Will get help right away if you are not doing well or get worse. Document Released: 08/05/2006 Document Revised: 06/17/2011 Document Reviewed: 10/06/2006 ExitCare Patient Information 2014 Exit[employer], [place].   ________________________________________________________________________  WHAT IS A BLOOD TRANSFUSION? Blood Transfusion Information  A transfusion is the replacement of blood or some of its parts. Blood is made up of multiple cells which provide different functions.  Red blood cells carry oxygen and are used for blood loss replacement.  White blood cells fight against infection.  Platelets control bleeding.  Plasma helps clot blood.  Other blood products are available for specialized needs, such as hemophilia or other clotting disorders. BEFORE THE TRANSFUSION  Who gives blood for transfusions?   Healthy volunteers who are fully evaluated to make sure their blood is safe. This is blood bank blood. Transfusion therapy is the safest it has ever been in the practice of medicine. Before blood is taken from a donor, a complete history is taken to make sure that person has no history of diseases nor engages in risky social behavior (examples are intravenous drug use or sexual activity with multiple partners). The donor's travel history is screened to minimize risk of transmitting  infections, such as malaria. The donated blood is tested for signs of infectious diseases, such as HIV and hepatitis. The blood is then tested to be sure it is compatible with you in order to minimize the chance of a transfusion reaction. If you or a relative donates blood, this is often done in anticipation of surgery and is not appropriate for emergency situations. It takes many days to process the donated blood. RISKS AND COMPLICATIONS Although transfusion therapy is very safe and saves many lives, the main dangers of transfusion include:   Getting an infectious disease.  Developing a transfusion reaction. This is an allergic reaction to something in the blood you were given. Every precaution is taken to prevent this. The decision to have a blood transfusion has been considered carefully by your caregiver before blood is given. Blood is not given unless the benefits outweigh the risks. AFTER THE TRANSFUSION  Right after receiving a blood transfusion, you will usually feel much better and more energetic. This is especially true if your red blood cells have gotten low (anemic). The transfusion raises the level of the red blood cells which carry oxygen, and this usually causes an energy increase.  The nurse administering the transfusion will monitor you carefully for complications. HOME CARE INSTRUCTIONS  No special instructions are needed after a transfusion. You may find your energy is better. Speak with your caregiver about any limitations on activity  for underlying diseases you may have. SEEK MEDICAL CARE IF:   Your condition is not improving after your transfusion.  You develop redness or irritation at the intravenous (IV) site. SEEK IMMEDIATE MEDICAL CARE IF:  Any of the following symptoms occur over the next 12 hours:  Shaking chills.  You have a temperature by mouth above 102 F (38.9 C), not controlled by medicine.  Chest, back, or muscle pain.  People around you feel you are  not acting correctly or are confused.  Shortness of breath or difficulty breathing.  Dizziness and fainting.  You get a rash or develop hives.  You have a decrease in urine output.  Your urine turns a dark color or changes to pink, red, or brown. Any of the following symptoms occur over the next 10 days:  You have a temperature by mouth above 102 F (38.9 C), not controlled by medicine.  Shortness of breath.  Weakness after normal activity.  The white part of the eye turns yellow (jaundice).  You have a decrease in the amount of urine or are urinating less often.  Your urine turns a dark color or changes to pink, red, or brown. Document Released: 03/22/2000 Document Revised: 06/17/2011 Document Reviewed: 11/09/2007 [facility] Patient Information 2014 [place], [place].  _______________________________________________________________________

## 2017-06-24 ENCOUNTER — Other Ambulatory Visit: Payer: Self-pay

## 2017-06-24 ENCOUNTER — Encounter (HOSPITAL_COMMUNITY): Payer: Self-pay

## 2017-06-24 ENCOUNTER — Encounter (HOSPITAL_COMMUNITY)
Admission: RE | Admit: 2017-06-24 | Discharge: 2017-06-24 | Disposition: A | Discharge status: Home / Self Care | Payer: Medicare Other | Source: Ambulatory Visit | Attending: Orthopedic Surgery | Admitting: Orthopedic Surgery

## 2017-06-24 DIAGNOSIS — Z01812 Encounter for preprocedural laboratory examination: Secondary | ICD-10-CM | POA: Insufficient documentation

## 2017-06-24 DIAGNOSIS — M1711 Unilateral primary osteoarthritis, right knee: Secondary | ICD-10-CM | POA: Insufficient documentation

## 2017-06-24 DIAGNOSIS — Z8679 Personal history of other diseases of the circulatory system: Secondary | ICD-10-CM | POA: Insufficient documentation

## 2017-06-24 DIAGNOSIS — I4891 Unspecified atrial fibrillation: Secondary | ICD-10-CM | POA: Insufficient documentation

## 2017-06-24 DIAGNOSIS — Z01810 Encounter for preprocedural cardiovascular examination: Secondary | ICD-10-CM | POA: Diagnosis not present

## 2017-06-24 DIAGNOSIS — R001 Bradycardia, unspecified: Secondary | ICD-10-CM | POA: Insufficient documentation

## 2017-06-24 HISTORY — DX: Effusion, right knee: M25.461

## 2017-06-24 HISTORY — DX: Erythema infectiosum (fifth disease): B08.3

## 2017-06-24 HISTORY — DX: Hypothyroidism, unspecified: E03.9

## 2017-06-24 HISTORY — DX: Major depressive disorder, single episode, unspecified: F32.9

## 2017-06-24 HISTORY — DX: Depression, unspecified: F32.A

## 2017-06-24 HISTORY — DX: Acute embolism and thrombosis of unspecified deep veins of unspecified lower extremity: I82.409

## 2017-06-24 LAB — BASIC METABOLIC PANEL
Anion gap: 7 (ref 5–15)
BUN: 14 mg/dL (ref 6–20)
CALCIUM: 9.5 mg/dL (ref 8.9–10.3)
CO2: 30 mmol/L (ref 22–32)
Chloride: 98 mmol/L — ABNORMAL LOW (ref 101–111)
Creatinine, Ser: 0.68 mg/dL (ref 0.44–1.00)
GFR calc non Af Amer: >60 mL/min (ref >60)
Glucose, Bld: 97 mg/dL (ref 65–99)
Potassium: 4.7 mmol/L (ref 3.5–5.1)
SODIUM: 135 mmol/L (ref 135–145)

## 2017-06-24 LAB — SURGICAL PCR SCREEN
MRSA, PCR: NEGATIVE
Staphylococcus aureus: NEGATIVE

## 2017-06-24 LAB — ABO/RH: ABO/RH(D): O NEG

## 2017-06-24 LAB — CBC
HCT: 40.3 % (ref 36.0–46.0)
Hemoglobin: 12.8 g/dL (ref 12.0–15.0)
MCH: 28.3 pg (ref 26.0–34.0)
MCHC: 31.8 g/dL (ref 30.0–36.0)
MCV: 89.2 fL (ref 78.0–100.0)
Platelets: 260 10*3/uL (ref 150–400)
RBC: 4.52 MIL/uL (ref 3.87–5.11)
RDW: 12.5 % (ref 11.5–15.5)
WBC: 4.9 10*3/uL (ref 4.0–10.5)

## 2017-06-25 NOTE — H&P (Signed)
TOTAL KNEE ADMISSION H&P  Patient is being admitted for right total knee arthroplasty.  Subjective:  Chief Complaint:  Right knee primary OA / pain  HPI: Crystal Russell, 67 y.o. female, has a history of pain and functional disability in the right knee due to arthritis and has failed non-surgical conservative treatments for greater than 12 weeks to includeNSAID's and/or analgesics, corticosteriod injections and activity modification.  Onset of symptoms was gradual, starting 1+ years ago with gradually worsening course since that time. The patient noted no past surgery on the right knee(s).  Patient currently rates pain in the right knee(s) at 10 out of 10 with activity. Patient has worsening of pain with activity and weight bearing, pain that interferes with activities of daily living, pain with passive range of motion, crepitus and joint swelling.  Patient has evidence of periarticular osteophytes and joint space narrowing by imaging studies.  There is no active infection.  Risks, benefits and expectations were discussed with the patient.  Risks including but not limited to the risk of anesthesia, blood clots, nerve damage, blood vessel damage, failure of the prosthesis, infection and up to and including death.  Patient understand the risks, benefits and expectations and wishes to proceed with surgery.   PCP: Mosie Lukes, MD  D/C Plans:       Home   Post-op Meds:       No Rx given  Tranexamic Acid:      To be given - topically (hx DVT)  Decadron:      Is to be given  FYI:     Xarelto then ASA  (hx DVT)  Norco  DME:   Pt equipment arranged   PT:   PT arranged   Patient Active Problem List   Diagnosis Date Noted  . Right knee pain 06/16/2017  . Postmenopausal bleeding 12/06/2015  . Varicose veins of bilateral lower extremities with other complications 88/41/6606  . Sun-damaged skin 12/04/2014  . Breast mass, left 05/16/2014  . Varicose veins of lower extremities with other  complications 30/16/0109  . Preventative health care 08/08/2013  . Hypothyroidism 08/08/2013  . Atrophic vaginitis 08/08/2013  . Cervical cancer screening 08/05/2013  . ETD (eustachian tube dysfunction) 05/26/2013  . Depression with anxiety 12/27/2012  . Ventral hernia 08/05/2012  . Varicose veins 08/05/2012  . DJD (degenerative joint disease) 08/05/2012  . Unspecified constipation 08/05/2012  . Hyperlipidemia, mixed 08/05/2012  . H/O gestational diabetes mellitus, not currently pregnant 08/05/2012  . Chicken pox   . Shingles   . Paroxysmal A-fib ([place])   . Heart murmur   . Spondylisthesis    Past Medical History:  Diagnosis Date  . Abdominal wall hernia 08/04/2012  . Atrial fibrillation ([place]) 2008   REPORTS SHE WAS AT [place], UPON WAKING I FELT MY HEART GOING BOOM BOOM BOOM BEATING OUT OF MY CHEST. I WENT TO HOSPITAL AND THEY ISED MEDICINE TO CONVERT ME , IT TOOK OVER 10 HOURS TO CONVERT ME. BUT AFTER THAT IVE NEVER HAD ANY ISSUES SINCE. I USED TO HAVE A CARDIOLOGUIST AS MY PRIMARY IN [place] BUT IHAVENT BEEN TO A CARDIOLOGIST SINCE I MOVED DOWN HERE.   Marland Kitchen Chicken pox as a child  . Depression   . Depression with anxiety 12/27/2012  . DJD (degenerative joint disease) 08/05/2012  . DVT (deep venous thrombosis) ([place]) 2018  . Fifth disease 1989   DX AFTER EXPOSURE FROM HER CHILDREN; DEVELOPED RHEUMATOID ARTHRITIS DURING COURSE OF ILLNESS. REPORTS:  " I DONT  HAVE IT ANYMORE SINCE THE DISEASE IS GONE"   . H/O gestational diabetes mellitus, not currently pregnant 08/05/2012   History of   . Heart murmur    echo 2012; "I HAVE A FUNCTIONAL MURMUR"   . Hyperlipidemia   . Hypothyroidism   . Knee effusion, right    while on Eliquis, discontinued Eliquis after DVT resolved  . Measles as a child  . Other and unspecified hyperlipidemia 08/05/2012  . Paroxysmal A-fib ([place])   . Shingles 43  . Spondylisthesis 2012   L5/S1 grade 3  . Varicose veins 08/05/2012   Extending up to right groin B/l LE   . Ventral hernia 08/05/2012  . Wears glasses     Past Surgical History:  Procedure Laterality Date  . BREAST DUCTAL SYSTEM EXCISION Right 05/05/2014   Procedure: RIGHT BREAST DUCT EXCISION;  Surgeon: Rolm Bookbinder, MD;  Location: [place];  Service: General;  Laterality: Right;  . COLONOSCOPY    . DILATION AND CURETTAGE OF UTERUS     x4  . Crystal   right leg    No current facility-administered medications for this encounter.    Current Outpatient Medications  Medication Sig Dispense Refill Last Dose  . acetaminophen (TYLENOL) 500 MG tablet Take 1,000 mg by mouth every 6 (six) hours as needed for moderate pain or headache.     Marland Kitchen aspirin 81 MG tablet Take 81 mg by mouth daily.   02/28/2017 at 2200  . aspirin-acetaminophen-caffeine (EXCEDRIN MIGRAINE) 250-250-65 MG tablet Take 1 tablet by mouth every 6 (six) hours as needed for headache.     Marland Kitchen atorvastatin (LIPITOR) 10 MG tablet TAKE 1/2 (ONE-HALF) TABLET BY MOUTH ONCE DAILY (Patient taking differently: TAKE 5 MG BY MOUTH ONCE DAILY) 45 tablet 1   . Co-Enzyme Q-10 100 MG CAPS Take 300 mg by mouth daily.   02/28/2017 at Unknown time  . levothyroxine (SYNTHROID, LEVOTHROID) 88 MCG tablet TAKE 1 TABLET BY MOUTH ONCE DAILY BEFORE BREAKFAST (Patient taking differently: TAKE 88 MCG BY MOUTH ONCE DAILY BEFORE BREAKFAST) 90 tablet 1   . polyethylene glycol powder (GLYCOLAX/MIRALAX) powder DISSOLVE 1 LEVEL SCOOPFUL OF POWDER IN WATER AND DRINK TWICE DAILY 850 g 3    Allergies  Allergen Reactions  . Crestor [Rosuvastatin] Other (See Comments)    Joint pain w/fibromyalgia  . Pravastatin Other (See Comments)    Joint pain w/fibromyalgia  . Penicillins Swelling, Rash and Other (See Comments)    Hands and feet swell, rash; childhood allergy  Has patient had a PCN reaction causing immediate rash, facial/tongue/throat swelling, SOB or lightheadedness with hypotension: Unknown Has patient had a PCN reaction  causing severe rash involving mucus membranes or skin necrosis: Unknown Has patient had a PCN reaction that required hospitalization: Yes Has patient had a PCN reaction occurring within the last 10 years: No If all of the above answers are "NO", then may proceed with Cephalosporin use.     Social History   Tobacco Use  . Smoking status: Former Smoker    Years: 4.00    Types: Cigarettes  . Smokeless tobacco: Never Used  Substance Use Topics  . Alcohol use: No    Family History  Problem Relation Age of Onset  . Cancer Mother        lung- smoker  . Liver disease Mother   . Cancer Father        lung- smoker  . Peripheral Artery Disease Sister   . Dementia  Maternal Grandmother   . Heart attack Maternal Grandfather   . Asthma Son   . Ulcerative colitis Son   . Asthma Son   . Ulcerative colitis Son   . Heart attack Paternal Uncle      Review of Systems  Constitutional: Negative.   HENT: Negative.   Eyes: Negative.   Respiratory: Negative.   Cardiovascular: Negative.   Gastrointestinal: Positive for constipation.  Genitourinary: Negative.   Musculoskeletal: Positive for joint pain.  Skin: Negative.   Neurological: Negative.   Endo/Heme/Allergies: Negative.   Psychiatric/Behavioral: Negative.     Objective:  Physical Exam  Constitutional: She is oriented to person, place, and time. She appears well-developed.  HENT:  Head: Normocephalic.  Eyes: Pupils are equal, round, and reactive to light.  Neck: Neck supple. No JVD present. No tracheal deviation present. No thyromegaly present.  Cardiovascular: Normal rate, regular rhythm and intact distal pulses.  Murmur heard. Respiratory: Effort normal and breath sounds normal. No respiratory distress. She has no wheezes.  GI: Soft. There is no tenderness. There is no guarding.  Musculoskeletal:       Right knee: She exhibits decreased range of motion, swelling and bony tenderness. She exhibits no ecchymosis, no deformity, no  laceration and no erythema. Tenderness found.  Lymphadenopathy:    She has no cervical adenopathy.  Neurological: She is alert and oriented to person, place, and time.  Skin: Skin is warm and dry.  Psychiatric: She has a normal mood and affect.      Labs:  Estimated body mass index is 25.26 kg/m as calculated from the following:   Height as of 06/24/17: 5\' 5"  (1.651 m).   Weight as of 06/24/17: 68.9 kg (151 lb 12.8 oz).   Imaging Review Plain radiographs demonstrate severe degenerative joint disease of the right knee.  The bone quality appears to be good for age and reported activity level.    Assessment/Plan:  End stage arthritis, right knee   The patient history, physical examination, clinical judgment of the provider and imaging studies are consistent with end stage degenerative joint disease of the right knee and total knee arthroplasty is deemed medically necessary. The treatment options including medical management, injection therapy arthroscopy and arthroplasty were discussed at length. The risks and benefits of total knee arthroplasty were presented and reviewed. The risks due to aseptic loosening, infection, stiffness, patella tracking problems, thromboembolic complications and other imponderables were discussed. The patient acknowledged the explanation, agreed to proceed with the plan and consent was signed. Patient is being admitted for inpatient treatment for surgery, pain control, PT, OT, prophylactic antibiotics, VTE prophylaxis, progressive ambulation and ADL's and discharge planning. The patient is planning to be discharged home.      [name] Erman Thum   PA-C  06/25/2017, 12:35 PM

## 2017-06-30 MED ORDER — GENTAMICIN SULFATE 40 MG/ML IJ SOLN
5.0000 mg/kg | INTRAVENOUS | Status: AC
  Administered 2017-07-01: 310 mg via INTRAVENOUS
  Filled 2017-06-30 (×2): qty 7.75

## 2017-06-30 MED ORDER — TRANEXAMIC ACID 1000 MG/10ML IV SOLN
2000.0000 mg | Freq: Once | INTRAVENOUS | Status: DC
  Filled 2017-06-30: qty 20

## 2017-06-30 MED ORDER — GENTAMICIN SULFATE 40 MG/ML IJ SOLN
5.0000 mg/kg | INTRAVENOUS | Status: DC
  Filled 2017-06-30: qty 7.75

## 2017-07-01 ENCOUNTER — Inpatient Hospital Stay (HOSPITAL_COMMUNITY): Payer: Medicare Other | Admitting: Certified Registered Nurse Anesthetist

## 2017-07-01 ENCOUNTER — Encounter (HOSPITAL_COMMUNITY): Payer: Self-pay | Admitting: *Deleted

## 2017-07-01 ENCOUNTER — Other Ambulatory Visit: Payer: Self-pay

## 2017-07-01 ENCOUNTER — Inpatient Hospital Stay (HOSPITAL_COMMUNITY)
Admission: RE | Admit: 2017-07-01 | Discharge: 2017-07-03 | DRG: 470 | Disposition: A | Discharge status: Home / Self Care | Payer: Medicare Other | Source: Ambulatory Visit | Attending: Orthopedic Surgery | Admitting: Orthopedic Surgery

## 2017-07-01 ENCOUNTER — Encounter (HOSPITAL_COMMUNITY)
Admission: RE | Disposition: A | Discharge status: Home / Self Care | Payer: Self-pay | Source: Ambulatory Visit | Attending: Orthopedic Surgery

## 2017-07-01 DIAGNOSIS — Z79899 Other long term (current) drug therapy: Secondary | ICD-10-CM | POA: Diagnosis not present

## 2017-07-01 DIAGNOSIS — Z7901 Long term (current) use of anticoagulants: Secondary | ICD-10-CM

## 2017-07-01 DIAGNOSIS — Z86718 Personal history of other venous thrombosis and embolism: Secondary | ICD-10-CM | POA: Diagnosis not present

## 2017-07-01 DIAGNOSIS — E039 Hypothyroidism, unspecified: Secondary | ICD-10-CM | POA: Diagnosis not present

## 2017-07-01 DIAGNOSIS — E785 Hyperlipidemia, unspecified: Secondary | ICD-10-CM | POA: Diagnosis not present

## 2017-07-01 DIAGNOSIS — Z96651 Presence of right artificial knee joint: Secondary | ICD-10-CM

## 2017-07-01 DIAGNOSIS — Z7982 Long term (current) use of aspirin: Secondary | ICD-10-CM

## 2017-07-01 DIAGNOSIS — E663 Overweight: Secondary | ICD-10-CM | POA: Diagnosis present

## 2017-07-01 DIAGNOSIS — Z6825 Body mass index (BMI) 25.0-25.9, adult: Secondary | ICD-10-CM | POA: Diagnosis not present

## 2017-07-01 DIAGNOSIS — E782 Mixed hyperlipidemia: Secondary | ICD-10-CM | POA: Diagnosis not present

## 2017-07-01 DIAGNOSIS — Z87891 Personal history of nicotine dependence: Secondary | ICD-10-CM | POA: Diagnosis not present

## 2017-07-01 DIAGNOSIS — G8918 Other acute postprocedural pain: Secondary | ICD-10-CM | POA: Diagnosis not present

## 2017-07-01 DIAGNOSIS — M1711 Unilateral primary osteoarthritis, right knee: Secondary | ICD-10-CM | POA: Diagnosis not present

## 2017-07-01 DIAGNOSIS — I480 Paroxysmal atrial fibrillation: Secondary | ICD-10-CM | POA: Diagnosis not present

## 2017-07-01 DIAGNOSIS — Z96659 Presence of unspecified artificial knee joint: Secondary | ICD-10-CM

## 2017-07-01 HISTORY — PX: TOTAL KNEE ARTHROPLASTY: SHX125

## 2017-07-01 LAB — TYPE AND SCREEN
ABO/RH(D): O NEG
ANTIBODY SCREEN: NEGATIVE

## 2017-07-01 SURGERY — ARTHROPLASTY, KNEE, TOTAL
Anesthesia: Spinal | Site: Knee | Laterality: Right

## 2017-07-01 MED ORDER — MENTHOL 3 MG MT LOZG
1.0000 | LOZENGE | OROMUCOSAL | Status: DC | PRN
  Filled 2017-07-01: qty 9

## 2017-07-01 MED ORDER — VANCOMYCIN HCL IN DEXTROSE 1-5 GM/200ML-% IV SOLN
1000.0000 mg | INTRAVENOUS | Status: AC
  Administered 2017-07-01: 1000 mg via INTRAVENOUS
  Filled 2017-07-01: qty 200

## 2017-07-01 MED ORDER — DOCUSATE SODIUM 100 MG PO CAPS: 100.0000 mg | ORAL_CAPSULE | Freq: Two times a day (BID) | ORAL | 0 refills | Status: DC

## 2017-07-01 MED ORDER — CELECOXIB 200 MG PO CAPS
200.0000 mg | ORAL_CAPSULE | Freq: Two times a day (BID) | ORAL | Status: DC
  Administered 2017-07-01 – 2017-07-03 (×4): 200 mg via ORAL
  Filled 2017-07-01 (×4): qty 1

## 2017-07-01 MED ORDER — LACTATED RINGERS IV SOLN: INTRAVENOUS | Status: DC

## 2017-07-01 MED ORDER — FENTANYL CITRATE (PF) 100 MCG/2ML IJ SOLN: 25.0000 ug | INTRAMUSCULAR | Status: DC | PRN

## 2017-07-01 MED ORDER — SODIUM CHLORIDE 0.9 % IR SOLN
Status: DC | PRN
  Administered 2017-07-01: 1000 mL

## 2017-07-01 MED ORDER — HYDROCODONE-ACETAMINOPHEN 5-325 MG PO TABS
1.0000 | ORAL_TABLET | ORAL | Status: DC | PRN
  Administered 2017-07-01 – 2017-07-02 (×4): 1 via ORAL
  Administered 2017-07-02 – 2017-07-03 (×2): 2 via ORAL
  Filled 2017-07-01: qty 2
  Filled 2017-07-01 (×2): qty 1
  Filled 2017-07-01: qty 2
  Filled 2017-07-01 (×2): qty 1

## 2017-07-01 MED ORDER — PHENOL 1.4 % MT LIQD: 1.0000 | OROMUCOSAL | Status: DC | PRN

## 2017-07-01 MED ORDER — METOCLOPRAMIDE HCL 5 MG/ML IJ SOLN: 5.0000 mg | Freq: Three times a day (TID) | INTRAMUSCULAR | Status: DC | PRN

## 2017-07-01 MED ORDER — PROPOFOL 10 MG/ML IV BOLUS
INTRAVENOUS | Status: AC
  Filled 2017-07-01: qty 40

## 2017-07-01 MED ORDER — ONDANSETRON HCL 4 MG/2ML IJ SOLN: 4.0000 mg | Freq: Four times a day (QID) | INTRAMUSCULAR | Status: DC | PRN

## 2017-07-01 MED ORDER — PROPOFOL 500 MG/50ML IV EMUL
INTRAVENOUS | Status: DC | PRN
  Administered 2017-07-01: 75 ug/kg/min via INTRAVENOUS

## 2017-07-01 MED ORDER — KETOROLAC TROMETHAMINE 30 MG/ML IJ SOLN
INTRAMUSCULAR | Status: AC
  Filled 2017-07-01: qty 1

## 2017-07-01 MED ORDER — DIPHENHYDRAMINE HCL 12.5 MG/5ML PO ELIX: 12.5000 mg | ORAL_SOLUTION | ORAL | Status: DC | PRN

## 2017-07-01 MED ORDER — SODIUM CHLORIDE 0.9 % IJ SOLN
INTRAMUSCULAR | Status: DC | PRN
  Administered 2017-07-01: 30 mL

## 2017-07-01 MED ORDER — LACTATED RINGERS IV SOLN
INTRAVENOUS | Status: DC
  Administered 2017-07-01 (×2): via INTRAVENOUS

## 2017-07-01 MED ORDER — KETOROLAC TROMETHAMINE 30 MG/ML IJ SOLN
INTRAMUSCULAR | Status: DC | PRN
  Administered 2017-07-01: 30 mg

## 2017-07-01 MED ORDER — HYDROCODONE-ACETAMINOPHEN 7.5-325 MG PO TABS: 1.0000 | ORAL_TABLET | ORAL | 0 refills | Status: DC | PRN

## 2017-07-01 MED ORDER — DEXAMETHASONE SODIUM PHOSPHATE 10 MG/ML IJ SOLN
10.0000 mg | Freq: Once | INTRAMUSCULAR | Status: AC
  Administered 2017-07-01: 10 mg via INTRAVENOUS

## 2017-07-01 MED ORDER — METHOCARBAMOL 1000 MG/10ML IJ SOLN
500.0000 mg | Freq: Four times a day (QID) | INTRAVENOUS | Status: DC | PRN
  Administered 2017-07-01: 500 mg via INTRAVENOUS
  Filled 2017-07-01: qty 550

## 2017-07-01 MED ORDER — VANCOMYCIN HCL IN DEXTROSE 1-5 GM/200ML-% IV SOLN
1000.0000 mg | Freq: Two times a day (BID) | INTRAVENOUS | Status: AC
  Administered 2017-07-01: 1000 mg via INTRAVENOUS
  Filled 2017-07-01: qty 200

## 2017-07-01 MED ORDER — METHOCARBAMOL 500 MG PO TABS
500.0000 mg | ORAL_TABLET | Freq: Four times a day (QID) | ORAL | Status: DC | PRN
  Administered 2017-07-01 – 2017-07-02 (×3): 500 mg via ORAL
  Filled 2017-07-01 (×3): qty 1

## 2017-07-01 MED ORDER — TRANEXAMIC ACID 1000 MG/10ML IV SOLN
INTRAVENOUS | Status: AC | PRN
  Administered 2017-07-01: 2000 mg via TOPICAL

## 2017-07-01 MED ORDER — FERROUS SULFATE 325 (65 FE) MG PO TABS
325.0000 mg | ORAL_TABLET | Freq: Three times a day (TID) | ORAL | Status: DC
  Administered 2017-07-01 – 2017-07-03 (×6): 325 mg via ORAL
  Filled 2017-07-01 (×6): qty 1

## 2017-07-01 MED ORDER — MEPERIDINE HCL 50 MG/ML IJ SOLN: 6.2500 mg | INTRAMUSCULAR | Status: DC | PRN

## 2017-07-01 MED ORDER — ACETAMINOPHEN 325 MG PO TABS: 325.0000 mg | ORAL_TABLET | Freq: Four times a day (QID) | ORAL | Status: DC | PRN

## 2017-07-01 MED ORDER — POLYETHYLENE GLYCOL 3350 17 G PO PACK
17.0000 g | PACK | Freq: Two times a day (BID) | ORAL | Status: DC
  Administered 2017-07-01 – 2017-07-03 (×4): 17 g via ORAL
  Filled 2017-07-01 (×4): qty 1

## 2017-07-01 MED ORDER — LEVOTHYROXINE SODIUM 88 MCG PO TABS
88.0000 ug | ORAL_TABLET | Freq: Every day | ORAL | Status: DC
  Filled 2017-07-01 (×2): qty 1

## 2017-07-01 MED ORDER — RIVAROXABAN 10 MG PO TABS
10.0000 mg | ORAL_TABLET | ORAL | Status: DC
  Administered 2017-07-02 – 2017-07-03 (×2): 10 mg via ORAL
  Filled 2017-07-01 (×2): qty 1

## 2017-07-01 MED ORDER — BISACODYL 10 MG RE SUPP: 10.0000 mg | Freq: Every day | RECTAL | Status: DC | PRN

## 2017-07-01 MED ORDER — ATORVASTATIN CALCIUM 10 MG PO TABS
5.0000 mg | ORAL_TABLET | Freq: Every day | ORAL | Status: DC
  Administered 2017-07-01 – 2017-07-02 (×2): 5 mg via ORAL
  Filled 2017-07-01 (×2): qty 1

## 2017-07-01 MED ORDER — EPHEDRINE SULFATE 50 MG/ML IJ SOLN
INTRAMUSCULAR | Status: DC | PRN
  Administered 2017-07-01: 10 mg via INTRAVENOUS
  Administered 2017-07-01 (×2): 5 mg via INTRAVENOUS
  Administered 2017-07-01 (×3): 10 mg via INTRAVENOUS

## 2017-07-01 MED ORDER — FENTANYL CITRATE (PF) 100 MCG/2ML IJ SOLN
50.0000 ug | INTRAMUSCULAR | Status: AC
  Administered 2017-07-01: 100 ug via INTRAVENOUS
  Filled 2017-07-01: qty 2

## 2017-07-01 MED ORDER — DEXAMETHASONE SODIUM PHOSPHATE 10 MG/ML IJ SOLN
INTRAMUSCULAR | Status: AC
  Filled 2017-07-01: qty 1

## 2017-07-01 MED ORDER — BUPIVACAINE HCL (PF) 0.25 % IJ SOLN
INTRAMUSCULAR | Status: AC
  Filled 2017-07-01: qty 30

## 2017-07-01 MED ORDER — DEXAMETHASONE SODIUM PHOSPHATE 10 MG/ML IJ SOLN
10.0000 mg | Freq: Once | INTRAMUSCULAR | Status: AC
  Administered 2017-07-02: 10 mg via INTRAVENOUS
  Filled 2017-07-01: qty 1

## 2017-07-01 MED ORDER — ALUM & MAG HYDROXIDE-SIMETH 200-200-20 MG/5ML PO SUSP: 15.0000 mL | ORAL | Status: DC | PRN

## 2017-07-01 MED ORDER — PROPOFOL 10 MG/ML IV BOLUS
INTRAVENOUS | Status: AC
  Filled 2017-07-01: qty 20

## 2017-07-01 MED ORDER — METOCLOPRAMIDE HCL 5 MG PO TABS: 5.0000 mg | ORAL_TABLET | Freq: Three times a day (TID) | ORAL | Status: DC | PRN

## 2017-07-01 MED ORDER — CHLORHEXIDINE GLUCONATE 4 % EX LIQD: 60.0000 mL | Freq: Once | CUTANEOUS | Status: DC

## 2017-07-01 MED ORDER — 0.9 % SODIUM CHLORIDE (POUR BTL) OPTIME
TOPICAL | Status: DC | PRN
  Administered 2017-07-01: 1000 mL

## 2017-07-01 MED ORDER — ROPIVACAINE HCL 7.5 MG/ML IJ SOLN
INTRAMUSCULAR | Status: DC | PRN
  Administered 2017-07-01: 20 mL via PERINEURAL

## 2017-07-01 MED ORDER — DOCUSATE SODIUM 100 MG PO CAPS
100.0000 mg | ORAL_CAPSULE | Freq: Two times a day (BID) | ORAL | Status: DC
  Administered 2017-07-01 – 2017-07-03 (×4): 100 mg via ORAL
  Filled 2017-07-01 (×4): qty 1

## 2017-07-01 MED ORDER — ONDANSETRON HCL 4 MG/2ML IJ SOLN
INTRAMUSCULAR | Status: DC | PRN
  Administered 2017-07-01: 4 mg via INTRAVENOUS

## 2017-07-01 MED ORDER — SODIUM CHLORIDE 0.9 % IV SOLN
INTRAVENOUS | Status: DC
Start: 2017-07-01 — End: 2017-07-03
  Administered 2017-07-01: 16:00:00 via INTRAVENOUS

## 2017-07-01 MED ORDER — MAGNESIUM CITRATE PO SOLN: 1.0000 | Freq: Once | ORAL | Status: DC | PRN

## 2017-07-01 MED ORDER — BUPIVACAINE IN DEXTROSE 0.75-8.25 % IT SOLN
INTRATHECAL | Status: DC | PRN
  Administered 2017-07-01: 1.6 mL via INTRATHECAL

## 2017-07-01 MED ORDER — METHOCARBAMOL 500 MG PO TABS: 500.0000 mg | ORAL_TABLET | Freq: Four times a day (QID) | ORAL | 0 refills | Status: DC | PRN

## 2017-07-01 MED ORDER — ASPIRIN 81 MG PO CHEW: 81.0000 mg | CHEWABLE_TABLET | Freq: Two times a day (BID) | ORAL | 0 refills | Status: AC

## 2017-07-01 MED ORDER — SODIUM CHLORIDE 0.9 % IJ SOLN
INTRAMUSCULAR | Status: AC
  Filled 2017-07-01: qty 50

## 2017-07-01 MED ORDER — EPHEDRINE 5 MG/ML INJ
INTRAVENOUS | Status: AC
  Filled 2017-07-01: qty 10

## 2017-07-01 MED ORDER — ONDANSETRON HCL 4 MG PO TABS
4.0000 mg | ORAL_TABLET | Freq: Four times a day (QID) | ORAL | Status: DC | PRN
Start: 2017-07-01 — End: 2017-07-03
  Administered 2017-07-02: 4 mg via ORAL
  Filled 2017-07-01: qty 1

## 2017-07-01 MED ORDER — FERROUS SULFATE 325 (65 FE) MG PO TABS: 325.0000 mg | ORAL_TABLET | Freq: Three times a day (TID) | ORAL | 3 refills | Status: DC

## 2017-07-01 MED ORDER — BUPIVACAINE HCL (PF) 0.25 % IJ SOLN
INTRAMUSCULAR | Status: DC | PRN
  Administered 2017-07-01: 30 mL

## 2017-07-01 MED ORDER — ONDANSETRON HCL 4 MG/2ML IJ SOLN
INTRAMUSCULAR | Status: AC
  Filled 2017-07-01: qty 2

## 2017-07-01 MED ORDER — MORPHINE SULFATE (PF) 2 MG/ML IV SOLN: 0.5000 mg | INTRAVENOUS | Status: DC | PRN

## 2017-07-01 MED ORDER — RIVAROXABAN 10 MG PO TABS: 10.0000 mg | ORAL_TABLET | Freq: Every day | ORAL | 0 refills | Status: DC

## 2017-07-01 MED ORDER — MIDAZOLAM HCL 2 MG/2ML IJ SOLN
1.0000 mg | INTRAMUSCULAR | Status: AC
  Administered 2017-07-01: 2 mg via INTRAVENOUS
  Filled 2017-07-01: qty 2

## 2017-07-01 MED ORDER — POLYETHYLENE GLYCOL 3350 17 G PO PACK: 17.0000 g | PACK | Freq: Two times a day (BID) | ORAL | 0 refills | Status: DC

## 2017-07-01 MED ORDER — HYDROCODONE-ACETAMINOPHEN 7.5-325 MG PO TABS
1.0000 | ORAL_TABLET | ORAL | Status: DC | PRN
  Administered 2017-07-02: 2 via ORAL
  Administered 2017-07-03 (×3): 1 via ORAL
  Filled 2017-07-01: qty 1
  Filled 2017-07-01: qty 2
  Filled 2017-07-01 (×2): qty 1

## 2017-07-01 MED ORDER — STERILE WATER FOR IRRIGATION IR SOLN
Status: DC | PRN
  Administered 2017-07-01: 2000 mL

## 2017-07-01 MED ORDER — METOCLOPRAMIDE HCL 5 MG/ML IJ SOLN: 10.0000 mg | Freq: Once | INTRAMUSCULAR | Status: DC | PRN

## 2017-07-01 SURGICAL SUPPLY — 53 items
BAG DECANTER FOR FLEXI CONT (MISCELLANEOUS) ×2 IMPLANT
BAG ZIPLOCK 12X15 (MISCELLANEOUS) IMPLANT
BANDAGE ACE 6X5 VEL STRL LF (GAUZE/BANDAGES/DRESSINGS) ×2 IMPLANT
BLADE SAW SGTL 11.0X1.19X90.0M (BLADE) ×2 IMPLANT
BLADE SAW SGTL 13.0X1.19X90.0M (BLADE) ×2 IMPLANT
BOWL SMART MIX CTS (DISPOSABLE) ×2 IMPLANT
CAPT KNEE TOTAL 3 ATTUNE ×2 IMPLANT
CEMENT HV SMART SET (Cement) ×4 IMPLANT
COVER SURGICAL LIGHT HANDLE (MISCELLANEOUS) ×2 IMPLANT
CUFF TOURN SGL QUICK 34 (TOURNIQUET CUFF) ×1
CUFF TRNQT CYL 34X4X40X1 (TOURNIQUET CUFF) ×1 IMPLANT
DECANTER SPIKE VIAL GLASS SM (MISCELLANEOUS) ×2 IMPLANT
DERMABOND ADVANCED (GAUZE/BANDAGES/DRESSINGS) ×1
DERMABOND ADVANCED .7 DNX12 (GAUZE/BANDAGES/DRESSINGS) ×1 IMPLANT
DRAPE TOP 10253 STERILE (DRAPES) IMPLANT
DRAPE U-SHAPE 47X51 STRL (DRAPES) ×2 IMPLANT
DRESSING AQUACEL AG SP 3.5X10 (GAUZE/BANDAGES/DRESSINGS) ×1 IMPLANT
DRSG AQUACEL AG SP 3.5X10 (GAUZE/BANDAGES/DRESSINGS) ×2
DURAPREP 26ML APPLICATOR (WOUND CARE) ×4 IMPLANT
ELECT REM PT RETURN 15FT ADLT (MISCELLANEOUS) ×2 IMPLANT
FACESHIELD WRAPAROUND (MASK) ×2 IMPLANT
GLOVE BIOGEL M 7.0 STRL (GLOVE) IMPLANT
GLOVE BIOGEL M STRL SZ7.5 (GLOVE) ×2 IMPLANT
GLOVE BIOGEL PI IND STRL 7.0 (GLOVE) ×3 IMPLANT
GLOVE BIOGEL PI IND STRL 7.5 (GLOVE) ×3 IMPLANT
GLOVE BIOGEL PI IND STRL 9 (GLOVE) ×2 IMPLANT
GLOVE BIOGEL PI INDICATOR 7.0 (GLOVE) ×3
GLOVE BIOGEL PI INDICATOR 7.5 (GLOVE) ×3
GLOVE BIOGEL PI INDICATOR 9 (GLOVE) ×2
GLOVE ECLIPSE 7.5 STRL STRAW (GLOVE) ×8 IMPLANT
GLOVE ECLIPSE 8.5 STRL (GLOVE) ×2 IMPLANT
GLOVE ORTHO TXT STRL SZ7.5 (GLOVE) ×4 IMPLANT
GOWN STRL REUS W/TWL 2XL LVL3 (GOWN DISPOSABLE) ×2 IMPLANT
GOWN STRL REUS W/TWL LRG LVL3 (GOWN DISPOSABLE) ×2 IMPLANT
GOWN STRL REUS W/TWL LRG LVL4 (GOWN DISPOSABLE) ×4 IMPLANT
GOWN STRL REUS W/TWL XL LVL3 (GOWN DISPOSABLE) ×6 IMPLANT
HANDPIECE INTERPULSE COAX TIP (DISPOSABLE) ×1
MANIFOLD NEPTUNE II (INSTRUMENTS) ×2 IMPLANT
PACK TOTAL KNEE CUSTOM (KITS) ×2 IMPLANT
POSITIONER SURGICAL ARM (MISCELLANEOUS) ×2 IMPLANT
SET HNDPC FAN SPRY TIP SCT (DISPOSABLE) ×1 IMPLANT
SET PAD KNEE POSITIONER (MISCELLANEOUS) ×2 IMPLANT
SUT MNCRL AB 4-0 PS2 18 (SUTURE) ×4 IMPLANT
SUT STRATAFIX PDS+ 0 24IN (SUTURE) ×6 IMPLANT
SUT VIC AB 1 CT1 36 (SUTURE) ×2 IMPLANT
SUT VIC AB 2-0 CT1 27 (SUTURE) ×3
SUT VIC AB 2-0 CT1 TAPERPNT 27 (SUTURE) ×3 IMPLANT
SYR 50ML LL SCALE MARK (SYRINGE) ×2 IMPLANT
TRAY FOLEY CATH 14FRSI W/METER (CATHETERS) ×2 IMPLANT
TRAY FOLEY W/METER SILVER 16FR (SET/KITS/TRAYS/PACK) IMPLANT
WATER STERILE IRR 1000ML POUR (IV SOLUTION) ×2 IMPLANT
WRAP KNEE MAXI GEL POST OP (GAUZE/BANDAGES/DRESSINGS) ×2 IMPLANT
YANKAUER SUCT BULB TIP 10FT TU (MISCELLANEOUS) ×2 IMPLANT

## 2017-07-01 NOTE — Anesthesia Procedure Notes (Signed)
Spinal  Patient location during procedure: OR End time: 07/01/2017 10:01 AM Staffing Anesthesiologist: Montez Hageman, MD Resident/CRNA: Maxwell Caul, CRNA Performed: resident/CRNA  Preanesthetic Checklist Completed: patient identified, site marked, surgical consent, pre-op evaluation, timeout performed, IV checked, risks and benefits discussed and monitors and equipment checked Spinal Block Patient position: sitting Prep: DuraPrep Patient monitoring: heart rate, continuous pulse ox and blood pressure Approach: midline Location: L3-4 Injection technique: single-shot Needle Needle type: Pencan  Needle gauge: 24 G Needle length: 10 cm Additional Notes Patient sitting for spinal placement. Dr Marcell Barlow at bedside during placement. Patient tolerated well. Negative heme/paresthesia.  Lot# [id number], Expiration date 10-06-2018.

## 2017-07-01 NOTE — Op Note (Signed)
NAME:  Crystal Russell                      MEDICAL RECORD NO.:  [id number]                             FACILITY:  [facility]      PHYSICIAN:  Pietro Cassis. Alvan Dame, M.D.  DATE OF BIRTH:  1951-03-17      DATE OF PROCEDURE:  07/01/2017                                     OPERATIVE REPORT         PREOPERATIVE DIAGNOSIS:  Right knee osteoarthritis.      POSTOPERATIVE DIAGNOSIS:  Right knee osteoarthritis.      FINDINGS:  The patient was noted to have complete loss of cartilage and   bone-on-bone arthritis with associated osteophytes in the patellofemoral and lateral compartments of   the knee with a significant synovitis and associated effusion.      PROCEDURE:  Right total knee replacement.      COMPONENTS USED:  DePuy Attune rotating platform posterior stabilized knee   system, a size 6N femur, 5 tibia, size 5 mm PS AOX insert, and 38 anatomic patellar   button.      SURGEON:  Pietro Cassis. Alvan Dame, M.D.      ASSISTANT:  Danae Orleans, PA-C.      ANESTHESIA:  Regional and Spinal.      SPECIMENS:  None.      COMPLICATION:  None.      DRAINS:  None.  EBL: <100cc      TOURNIQUET TIME:  28 min at 250 mmHg     The patient was stable to the recovery room.      INDICATION FOR PROCEDURE:  Crystal Russell is a 67 y.o. female patient of   mine.  The patient had been seen, evaluated, and treated conservatively in the   office with medication, activity modification, and injections.  The patient had   radiographic changes of bone-on-bone arthritis with endplate sclerosis and osteophytes noted.      The patient failed conservative measures including medication, injections, and activity modification, and at this point was ready for more definitive measures.   Based on the radiographic changes and failed conservative measures, the patient   decided to proceed with total knee replacement.  Risks of infection,   DVT, component failure, need for revision surgery, postop course, and   expectations were all    discussed and reviewed.  Consent was obtained for benefit of pain   relief.      PROCEDURE IN DETAIL:  The patient was brought to the operative theater.   Once adequate anesthesia, preoperative antibiotics, 2 gm of Ancef, 1 gm of Tranexamic Acid, and 10 mg of Decadron administered, the patient was positioned supine with the right thigh tourniquet placed.  The  right lower extremity was prepped and draped in sterile fashion.  A time-   out was performed identifying the patient, planned procedure, and   extremity.      The right lower extremity was placed in the [facility] leg holder.  The leg was   exsanguinated, tourniquet elevated to 250 mmHg.  A midline incision was   made followed by median parapatellar arthrotomy.  Following initial   exposure, attention was first  directed to the patella.  Precut   measurement was noted to be 16 mm.  I resected down to 13 mm and used a   38 anatomic patellar button to restore patellar height as well as cover the cut   surface.      The lug holes were drilled and a metal shim was placed to protect the   patella from retractors and saw blades.      At this point, attention was now directed to the femur.  The femoral   canal was opened with a drill, irrigated to try to prevent fat emboli.  An   intramedullary rod was passed at 3 degrees valgus, 9 mm of bone was   resected off the distal femur.  Following this resection, the tibia was   subluxated anteriorly.  Using the extramedullary guide, 2 mm of bone was resected off   the proximal medial tibia.  We confirmed the gap would be   stable medially and laterally with a size 5 spacer block as well as confirmed   the cut was perpendicular in the coronal plane, checking with an alignment rod.      Once this was done, I sized the femur to be a size 6 in the anterior-   posterior dimension, chose a narrow component based on medial and   lateral dimension.  The size 6 rotation block was then pinned in    position anterior referenced using the C-clamp to set rotation.  The   anterior, posterior, and  chamfer cuts were made without difficulty nor   notching making certain that I was along the anterior cortex to help   with flexion gap stability.      The final box cut was made off the lateral aspect of distal femur.      At this point, the tibia was sized to be a size 5, the size 5 tray was   then pinned in position through the medial third of the tubercle,   drilled, and keel punched.  Trial reduction was now carried with a 6 femur,  5 tibia, a size 5 mm PS insert, and the 38 anatomic patella botton.  The knee was brought to   extension, full extension with good flexion stability with the patella   tracking through the trochlea without application of pressure.  Given   all these findings the femoral lug holes were drilled and then the trial components removed.  Final components were   opened and cement was mixed.  The knee was irrigated with normal saline   solution and pulse lavage.  The synovial lining was   then injected with 30 cc of 0.25% Marcaine with epinephrine and 1 cc of Toradol plus 30 cc of NS for a total of 61 cc.      The knee was irrigated.  Final implants were then cemented onto clean and   dried cut surfaces of bone with the knee brought to extension with a size 6 mm PS trial insert.      Once the cement had fully cured, the excess cement was removed   throughout the knee.  I confirmed I was satisfied with the range of   motion and stability, and the final size 6 mm PS AOX insert was chosen.  It was   placed into the knee.      The tourniquet had been let down at 28 minutes.  No significant   hemostasis required.  The   extensor mechanism  was then reapproximated using #1 Vicryl and #1 Stratafix sutures with the knee   in flexion.  The   remaining wound was closed with 2-0 Vicryl and running 4-0 Monocryl.   The knee was cleaned, dried, dressed sterilely using Dermabond  and   Aquacel dressing.  The patient was then   brought to recovery room in stable condition, tolerating the procedure   well.   Please note that Physician Assistant, Danae Orleans, PA-C, was present for the entirety of the case, and was utilized for pre-operative positioning, peri-operative retractor management, general facilitation of the procedure.  He was also utilized for primary wound closure at the end of the case.              Pietro Cassis Alvan Dame, M.D.    07/01/2017 9:59 AM

## 2017-07-01 NOTE — Anesthesia Procedure Notes (Signed)
Anesthesia Regional Block: Adductor canal block   Pre-Anesthetic Checklist: ,, timeout performed, Correct Patient, Correct Site, Correct Laterality, Correct Procedure, Correct Position, site marked, Risks and benefits discussed,  Surgical consent,  Pre-op evaluation,  At surgeon's request and post-op pain management  Laterality: Right and Lower  Prep: Maximum Sterile Barrier Precautions used, chloraprep       Needles:  Injection technique: Single-shot  Needle Type: Echogenic Stimulator Needle     Needle Length: 10cm      Additional Needles:   Procedures:,,,, ultrasound used (permanent image in chart),,,,  Narrative:  Start time: 07/01/2017 9:33 AM End time: 07/01/2017 9:43 AM Injection made incrementally with aspirations every 5 mL.  Performed by: Personally  Anesthesiologist: Montez Hageman, MD  Additional Notes: Risks, benefits and alternative to block explained extensively.  Patient tolerated procedure well, without complications.

## 2017-07-01 NOTE — Anesthesia Postprocedure Evaluation (Signed)
Anesthesia Post Note  Patient: Crystal Russell  Procedure(s) Performed: RIGHT TOTAL KNEE ARTHROPLASTY (Right Knee)     Patient location during evaluation: PACU Anesthesia Type: Spinal Level of consciousness: awake and alert Pain management: pain level controlled Vital Signs Assessment: post-procedure vital signs reviewed and stable Respiratory status: spontaneous breathing and respiratory function stable Cardiovascular status: blood pressure returned to baseline and stable Postop Assessment: no headache, no backache, spinal receding and no apparent nausea or vomiting Anesthetic complications: no    Last Vitals:  Vitals:   07/01/17 1429 07/01/17 1531  BP: 111/62 116/74  Pulse: (!) 49 (!) 53  Resp: 16 12  Temp: (!) 36.4 C 37.2 C  SpO2: 100% 95%    Last Pain:  Vitals:   07/01/17 1531  TempSrc: Oral  PainSc:                  Montez Hageman

## 2017-07-01 NOTE — Interval H&P Note (Signed)
History and Physical Interval Note:  07/01/2017 8:44 AM  Crystal Russell  has presented today for surgery, with the diagnosis of Right knee osteoarthritis  The various methods of treatment have been discussed with the patient and family. After consideration of risks, benefits and other options for treatment, the patient has consented to  Procedure(s) with comments: RIGHT TOTAL KNEE ARTHROPLASTY (Right) - 70 mins as a surgical intervention .  The patient's history has been reviewed, patient examined, no change in status, stable for surgery.  I have reviewed the patient's chart and labs.  Questions were answered to the patient's satisfaction.     Mauri Pole

## 2017-07-01 NOTE — Progress Notes (Signed)
PT Cancellation Note  Patient Details Name: Maddisyn Hegwood MRN: [id number] DOB: 1950/12/23   Cancelled Treatment:    Reason Eval/Treat Not Completed: Medical issues which prohibited therapy(anesthesia hasn't worn off yet, pt numb to light touch medial thigh. Nursing to dangle pt tonight. Will see pt tomorrow. ) Instructed pt in ankle pumps and quad sets for independent exercise.    Blondell Reveal Kistler 07/01/2017, 4:35 PM [phone]

## 2017-07-01 NOTE — Progress Notes (Signed)
Assisted Dr. Carignan with right, ultrasound guided, transabdominal plane block. Side rails up, monitors on throughout procedure. See vital signs in flow sheet. Tolerated Procedure well. 

## 2017-07-01 NOTE — Discharge Instructions (Addendum)
Information on my medicine - XARELTO® (Rivaroxaban) ° °This medication education was reviewed with me or my healthcare representative as part of my discharge preparation.  °Why was Xarelto® prescribed for you? °Xarelto® was prescribed for you to reduce the risk of blood clots forming after orthopedic surgery. The medical term for these abnormal blood clots is venous thromboembolism (VTE). ° °What do you need to know about xarelto® ? °Take your Xarelto® ONCE DAILY at the same time every day. °You may take it either with or without food. ° °If you have difficulty swallowing the tablet whole, you may crush it and mix in applesauce just prior to taking your dose. ° °Take Xarelto® exactly as prescribed by your doctor and DO NOT stop taking Xarelto® without talking to the doctor who prescribed the medication.  Stopping without other VTE prevention medication to take the place of Xarelto® may increase your risk of developing a clot. ° °After discharge, you should have regular check-up appointments with your healthcare provider that is prescribing your Xarelto®.   ° °What do you do if you miss a dose? °If you miss a dose, take it as soon as you remember on the same day then continue your regularly scheduled once daily regimen the next day. Do not take two doses of Xarelto® on the same day.  ° °Important Safety Information °A possible side effect of Xarelto® is bleeding. You should call your healthcare provider right away if you experience any of the following: °? Bleeding from an injury or your nose that does not stop. °? Unusual colored urine (red or dark brown) or unusual colored stools (red or black). °? Unusual bruising for unknown reasons. °? A serious fall or if you hit your head (even if there is no bleeding). ° °Some medicines may interact with Xarelto® and might increase your risk of bleeding while on Xarelto®. To help avoid this, consult your healthcare provider or pharmacist prior to using any new prescription or  non-prescription medications, including herbals, vitamins, non-steroidal anti-inflammatory drugs (NSAIDs) and supplements. ° °This website has more information on Xarelto®: www.xarelto.com. ° °INSTRUCTIONS AFTER JOINT REPLACEMENT  ° °o Remove items at home which could result in a fall. This includes throw rugs or furniture in walking pathways °o ICE to the affected joint every three hours while awake for 30 minutes at a time, for at least the first 3-5 days, and then as needed for pain and swelling.  Continue to use ice for pain and swelling. You may notice swelling that will progress down to the foot and ankle.  This is normal after surgery.  Elevate your leg when you are not up walking on it.   °o Continue to use the breathing machine you got in the hospital (incentive spirometer) which will help keep your temperature down.  It is common for your temperature to cycle up and down following surgery, especially at night when you are not up moving around and exerting yourself.  The breathing machine keeps your lungs expanded and your temperature down. ° ° °DIET:  As you were doing prior to hospitalization, we recommend a well-balanced diet. ° °DRESSING / WOUND CARE / SHOWERING ° °Keep the surgical dressing until follow up.  The dressing is water proof, so you can shower without any extra covering.  IF THE DRESSING FALLS OFF or the wound gets wet inside, change the dressing with sterile gauze.  Please use good hand washing techniques before changing the dressing.  Do not use any lotions or   creams on the incision until instructed by your surgeon.   ° °ACTIVITY ° °o Increase activity slowly as tolerated, but follow the weight bearing instructions below.   °o No driving for 6 weeks or until further direction given by your physician.  You cannot drive while taking narcotics.  °o No lifting or carrying greater than 10 lbs. until further directed by your surgeon. °o Avoid periods of inactivity such as sitting longer than an  hour when not asleep. This helps prevent blood clots.  °o You may return to work once you are authorized by your doctor.  ° ° ° °WEIGHT BEARING  ° °Weight bearing as tolerated with assist device (walker, cane, etc) as directed, use it as long as suggested by your surgeon or therapist, typically at least 4-6 weeks. ° ° °EXERCISES ° °Results after joint replacement surgery are often greatly improved when you follow the exercise, range of motion and muscle strengthening exercises prescribed by your doctor. Safety measures are also important to protect the joint from further injury. Any time any of these exercises cause you to have increased pain or swelling, decrease what you are doing until you are comfortable again and then slowly increase them. If you have problems or questions, call your caregiver or physical therapist for advice.  ° °Rehabilitation is important following a joint replacement. After just a few days of immobilization, the muscles of the leg can become weakened and shrink (atrophy).  These exercises are designed to build up the tone and strength of the thigh and leg muscles and to improve motion. Often times heat used for twenty to thirty minutes before working out will loosen up your tissues and help with improving the range of motion but do not use heat for the first two weeks following surgery (sometimes heat can increase post-operative swelling).  ° °These exercises can be done on a training (exercise) mat, on the floor, on a table or on a bed. Use whatever works the best and is most comfortable for you.    Use music or television while you are exercising so that the exercises are a pleasant break in your day. This will make your life better with the exercises acting as a break in your routine that you can look forward to.   Perform all exercises about fifteen times, three times per day or as directed.  You should exercise both the operative leg and the other leg as well. ° °Exercises include: °    °• Quad Sets - Tighten up the muscle on the front of the thigh (Quad) and hold for 5-10 seconds.   °• Straight Leg Raises - With your knee straight (if you were given a brace, keep it on), lift the leg to 60 degrees, hold for 3 seconds, and slowly lower the leg.  Perform this exercise against resistance later as your leg gets stronger.  °• Leg Slides: Lying on your back, slowly slide your foot toward your buttocks, bending your knee up off the floor (only go as far as is comfortable). Then slowly slide your foot back down until your leg is flat on the floor again.  °• Angel Wings: Lying on your back spread your legs to the side as far apart as you can without causing discomfort.  °• Hamstring Strength:  Lying on your back, push your heel against the floor with your leg straight by tightening up the muscles of your buttocks.  Repeat, but this time bend your knee to a comfortable angle,   and push your heel against the floor.  You may put a pillow under the heel to make it more comfortable if necessary.  ° °A rehabilitation program following joint replacement surgery can speed recovery and prevent re-injury in the future due to weakened muscles. Contact your doctor or a physical therapist for more information on knee rehabilitation.  ° ° °CONSTIPATION ° °Constipation is defined medically as fewer than three stools per week and severe constipation as less than one stool per week.  Even if you have a regular bowel pattern at home, your normal regimen is likely to be disrupted due to multiple reasons following surgery.  Combination of anesthesia, postoperative narcotics, change in appetite and fluid intake all can affect your bowels.  ° °YOU MUST use at least one of the following options; they are listed in order of increasing strength to get the job done.  They are all available over the counter, and you may need to use some, POSSIBLY even all of these options:   ° °Drink plenty of fluids (prune juice may be helpful) and  high fiber foods °Colace 100 mg by mouth twice a day  °Senokot for constipation as directed and as needed Dulcolax (bisacodyl), take with full glass of water  °Miralax (polyethylene glycol) once or twice a day as needed. ° °If you have tried all these things and are unable to have a bowel movement in the first 3-4 days after surgery call either your surgeon or your primary doctor.   ° °If you experience loose stools or diarrhea, hold the medications until you stool forms back up.  If your symptoms do not get better within 1 week or if they get worse, check with your doctor.  If you experience "the worst abdominal pain ever" or develop nausea or vomiting, please contact the office immediately for further recommendations for treatment. ° ° °ITCHING:  If you experience itching with your medications, try taking only a single pain pill, or even half a pain pill at a time.  You can also use Benadryl over the counter for itching or also to help with sleep.  ° °TED HOSE STOCKINGS:  Use stockings on both legs until for at least 2 weeks or as directed by physician office. They may be removed at night for sleeping. ° °MEDICATIONS:  See your medication summary on the “After Visit Summary” that nursing will review with you.  You may have some home medications which will be placed on hold until you complete the course of blood thinner medication.  It is important for you to complete the blood thinner medication as prescribed. ° °PRECAUTIONS:  If you experience chest pain or shortness of breath - call 911 immediately for transfer to the hospital emergency department.  ° °If you develop a fever greater that 101 F, purulent drainage from wound, increased redness or drainage from wound, foul odor from the wound/dressing, or calf pain - CONTACT YOUR SURGEON.   °                                                °FOLLOW-UP APPOINTMENTS:  If you do not already have a post-op appointment, please call the office for an appointment to be seen  by your surgeon.  Guidelines for how soon to be seen are listed in your “After Visit Summary”, but are typically between 1-4   weeks after surgery. ° °OTHER INSTRUCTIONS:  ° °Knee Replacement:  Do not place pillow under knee, focus on keeping the knee straight while resting.  ° °MAKE SURE YOU:  °• Understand these instructions.  °• Get help right away if you are not doing well or get worse.  ° ° °Thank you for letting us be a part of your medical care team.  It is a privilege we respect greatly.  We hope these instructions will help you stay on track for a fast and full recovery!  °  °

## 2017-07-01 NOTE — Plan of Care (Signed)
  Problem: Education: Goal: Knowledge of General Education information will improve Outcome: Progressing   Problem: Health Behavior/Discharge Planning: Goal: Ability to manage health-related needs will improve Outcome: Progressing   Problem: Clinical Measurements: Goal: Ability to maintain clinical measurements within normal limits will improve Outcome: Progressing Goal: Will remain free from infection Outcome: Progressing Goal: Diagnostic test results will improve Outcome: Progressing Goal: Respiratory complications will improve Outcome: Progressing Goal: Cardiovascular complication will be avoided Outcome: Progressing   Problem: Activity: Goal: Risk for activity intolerance will decrease Outcome: Progressing   Problem: Nutrition: Goal: Adequate nutrition will be maintained Outcome: Progressing   Problem: Coping: Goal: Level of anxiety will decrease Outcome: Progressing   Problem: Elimination: Goal: Will not experience complications related to bowel motility Outcome: Progressing Goal: Will not experience complications related to urinary retention Outcome: Progressing   Problem: Pain Managment: Goal: General experience of comfort will improve Outcome: Progressing   Problem: Safety: Goal: Ability to remain free from injury will improve Outcome: Progressing   Problem: Skin Integrity: Goal: Risk for impaired skin integrity will decrease Outcome: Progressing   Problem: Education: Goal: Knowledge of the prescribed therapeutic regimen will improve Outcome: Progressing   Problem: Activity: Goal: Ability to avoid complications of mobility impairment will improve Outcome: Progressing Goal: Range of joint motion will improve Outcome: Progressing   Problem: Clinical Measurements: Goal: Postoperative complications will be avoided or minimized Outcome: Progressing   Problem: Pain Management: Goal: Pain level will decrease with appropriate interventions Outcome:  Progressing   Problem: Skin Integrity: Goal: Signs of wound healing will improve Outcome: Progressing   

## 2017-07-01 NOTE — Transfer of Care (Signed)
Immediate Anesthesia Transfer of Care Note  Patient: Crystal Russell  Procedure(s) Performed: RIGHT TOTAL KNEE ARTHROPLASTY (Right Knee)  Patient Location: PACU  Anesthesia Type:Regional and Spinal  Level of Consciousness: awake, alert  and oriented  Airway & Oxygen Therapy: Patient Spontanous Breathing and Patient connected to face mask oxygen  Post-op Assessment: Report given to RN and Post -op Vital signs reviewed and stable  Post vital signs: Reviewed and stable  Last Vitals:  Vitals Value Taken Time  BP 103/68 07/01/2017 11:53 AM  Temp    Pulse 57 07/01/2017 11:55 AM  Resp 16 07/01/2017 11:55 AM  SpO2 100 % 07/01/2017 11:55 AM  Vitals shown include unvalidated device data.  Last Pain:  Vitals:   07/01/17 0817  TempSrc:   PainSc: 0-No pain         Complications: No apparent anesthesia complications

## 2017-07-01 NOTE — Anesthesia Preprocedure Evaluation (Signed)
Anesthesia Evaluation  Patient identified by MRN, date of birth, ID band Patient awake    Reviewed: Allergy & Precautions, NPO status , Patient's Chart, lab work & pertinent test results  Airway Mallampati: II  TM Distance: >3 FB Neck ROM: Full    Dental no notable dental hx.    Pulmonary neg pulmonary ROS, former smoker,    Pulmonary exam normal breath sounds clear to auscultation       Cardiovascular Normal cardiovascular exam+ dysrhythmias Atrial Fibrillation  Rhythm:Regular Rate:Normal     Neuro/Psych Anxiety Depression negative neurological ROS     GI/Hepatic negative GI ROS, Neg liver ROS,   Endo/Other  Hypothyroidism   Renal/GU negative Renal ROS  negative genitourinary   Musculoskeletal negative musculoskeletal ROS (+)   Abdominal   Peds negative pediatric ROS (+)  Hematology negative hematology ROS (+)   Anesthesia Other Findings   Reproductive/Obstetrics negative OB ROS                             Anesthesia Physical Anesthesia Plan  ASA: II  Anesthesia Plan: Spinal   Post-op Pain Management:  Regional for Post-op pain   Induction: Intravenous  PONV Risk Score and Plan: 2 and Ondansetron and Treatment may vary due to age or medical condition  Airway Management Planned: Simple Face Mask  Additional Equipment:   Intra-op Plan:   Post-operative Plan:   Informed Consent: I have reviewed the patients History and Physical, chart, labs and discussed the procedure including the risks, benefits and alternatives for the proposed anesthesia with the patient or authorized representative who has indicated his/her understanding and acceptance.   Dental advisory given  Plan Discussed with: CRNA  Anesthesia Plan Comments:         Anesthesia Quick Evaluation

## 2017-07-02 DIAGNOSIS — I480 Paroxysmal atrial fibrillation: Secondary | ICD-10-CM | POA: Diagnosis present

## 2017-07-02 DIAGNOSIS — M25561 Pain in right knee: Secondary | ICD-10-CM | POA: Diagnosis not present

## 2017-07-02 DIAGNOSIS — Z79899 Other long term (current) drug therapy: Secondary | ICD-10-CM | POA: Diagnosis not present

## 2017-07-02 DIAGNOSIS — E039 Hypothyroidism, unspecified: Secondary | ICD-10-CM | POA: Diagnosis present

## 2017-07-02 DIAGNOSIS — Z86718 Personal history of other venous thrombosis and embolism: Secondary | ICD-10-CM | POA: Diagnosis not present

## 2017-07-02 DIAGNOSIS — Z96651 Presence of right artificial knee joint: Secondary | ICD-10-CM

## 2017-07-02 DIAGNOSIS — E663 Overweight: Secondary | ICD-10-CM | POA: Diagnosis present

## 2017-07-02 DIAGNOSIS — E782 Mixed hyperlipidemia: Secondary | ICD-10-CM | POA: Diagnosis present

## 2017-07-02 DIAGNOSIS — Z6825 Body mass index (BMI) 25.0-25.9, adult: Secondary | ICD-10-CM | POA: Diagnosis not present

## 2017-07-02 DIAGNOSIS — Z7901 Long term (current) use of anticoagulants: Secondary | ICD-10-CM | POA: Diagnosis not present

## 2017-07-02 DIAGNOSIS — Z87891 Personal history of nicotine dependence: Secondary | ICD-10-CM | POA: Diagnosis not present

## 2017-07-02 DIAGNOSIS — Z7982 Long term (current) use of aspirin: Secondary | ICD-10-CM | POA: Diagnosis not present

## 2017-07-02 DIAGNOSIS — M1711 Unilateral primary osteoarthritis, right knee: Secondary | ICD-10-CM | POA: Diagnosis present

## 2017-07-02 LAB — BASIC METABOLIC PANEL
ANION GAP: 7 (ref 5–15)
BUN: 10 mg/dL (ref 6–20)
CHLORIDE: 98 mmol/L — AB (ref 101–111)
CO2: 27 mmol/L (ref 22–32)
Calcium: 8.9 mg/dL (ref 8.9–10.3)
Creatinine, Ser: 0.55 mg/dL (ref 0.44–1.00)
GFR calc non Af Amer: >60 mL/min (ref >60)
Glucose, Bld: 127 mg/dL — ABNORMAL HIGH (ref 65–99)
POTASSIUM: 4.3 mmol/L (ref 3.5–5.1)
SODIUM: 132 mmol/L — AB (ref 135–145)

## 2017-07-02 LAB — CBC
HEMATOCRIT: 30.6 % — AB (ref 36.0–46.0)
HEMOGLOBIN: 10 g/dL — AB (ref 12.0–15.0)
MCH: 28.9 pg (ref 26.0–34.0)
MCHC: 32.7 g/dL (ref 30.0–36.0)
MCV: 88.4 fL (ref 78.0–100.0)
Platelets: 201 10*3/uL (ref 150–400)
RBC: 3.46 MIL/uL — AB (ref 3.87–5.11)
RDW: 12.5 % (ref 11.5–15.5)
WBC: 9.8 10*3/uL (ref 4.0–10.5)

## 2017-07-02 NOTE — Evaluation (Signed)
Physical Therapy Evaluation Patient Details Name: Crystal Russell MRN: [id number] DOB: 08/02/1950 Today's Date: 07/02/2017   History of Present Illness  Pt is a 67 year old female s/p R TKA  Clinical Impression  Pt is s/p TKA resulting in the deficits listed below (see PT Problem List).  Pt will benefit from skilled PT to increase their independence and safety with mobility to allow discharge to the venue listed below.  Pt assisted into bathroom and then ambulated in hallway POD #1.  Pt plans to d/c home tomorrow.     Follow Up Recommendations Follow surgeon's recommendation for DC plan and follow-up therapies    Equipment Recommendations  None recommended by PT    Recommendations for Other Services       Precautions / Restrictions Precautions Precautions: Knee Restrictions Other Position/Activity Restrictions: WBAT      Mobility  Bed Mobility Overal bed mobility: Needs Assistance Bed Mobility: Supine to Sit     Supine to sit: Min guard     General bed mobility comments: verbal cues for pt to self assist  Transfers Overall transfer level: Needs assistance Equipment used: Rolling walker (2 wheeled) Transfers: Sit to/from Stand Sit to Stand: Min guard         General transfer comment: verbal cues for UE and LE positioning  Ambulation/Gait Ambulation/Gait assistance: Min guard Ambulation Distance (Feet): 80 Feet Assistive device: Rolling walker (2 wheeled) Gait Pattern/deviations: Step-to pattern;Decreased stance time - right;Antalgic     General Gait Details: verbal cues for sequence, RW positioning, step length, posture  Stairs            Wheelchair Mobility    Modified Rankin (Stroke Patients Only)       Balance                                             Pertinent Vitals/Pain Pain Assessment: 0-10 Pain Score: 4  Pain Location: R knee Pain Descriptors / Indicators: Sore;Aching Pain Intervention(s): Limited activity  within patient's tolerance;Repositioned;Monitored during session    Home Living Family/patient expects to be discharged to:: Private residence Living Arrangements: Spouse/significant other   Type of Home: House Home Access: Stairs to enter Entrance Stairs-Rails: Right Entrance Stairs-Number of Steps: 3 Home Layout: One level Home Equipment: Environmental consultant - 2 wheels      Prior Function Level of Independence: Independent               Hand Dominance        Extremity/Trunk Assessment        Lower Extremity Assessment Lower Extremity Assessment: RLE deficits/detail RLE Deficits / Details: unable to perform SLR, ROM TBA       Communication   Communication: No difficulties  Cognition Arousal/Alertness: Awake/alert Behavior During Therapy: WFL for tasks assessed/performed Overall Cognitive Status: Within Functional Limits for tasks assessed                                        General Comments      Exercises     Assessment/Plan    PT Assessment Patient needs continued PT services  PT Problem List Decreased strength;Decreased mobility;Decreased activity tolerance;Decreased balance;Decreased knowledge of use of DME;Decreased range of motion;Decreased knowledge of precautions;Pain       PT Treatment Interventions Gait  training;Therapeutic exercise;Patient/family education;DME instruction;Therapeutic activities;Functional mobility training;Stair training    PT Goals (Current goals can be found in the Care Plan section)  Acute Rehab PT Goals PT Goal Formulation: With patient Time For Goal Achievement: 07/09/17 Potential to Achieve Goals: Good    Frequency 7X/week   Barriers to discharge        Co-evaluation               AM-PAC PT "6 Clicks" Daily Activity  Outcome Measure Difficulty turning over in bed (including adjusting bedclothes, sheets and blankets)?: A Little Difficulty moving from lying on back to sitting on the side of the  bed? : A Lot Difficulty sitting down on and standing up from a chair with arms (e.g., wheelchair, bedside commode, etc,.)?: Unable Help needed moving to and from a bed to chair (including a wheelchair)?: A Little Help needed walking in hospital room?: A Little Help needed climbing 3-5 steps with a railing? : A Lot 6 Click Score: 14    End of Session Equipment Utilized During Treatment: Gait belt Activity Tolerance: Patient tolerated treatment well Patient left: with call bell/phone within reach;in chair;with family/visitor present Nurse Communication: Mobility status PT Visit Diagnosis: Other abnormalities of gait and mobility (R26.89)    Time: 9892-1194 PT Time Calculation (min) (ACUTE ONLY): 24 min   Charges:   PT Evaluation $PT Eval Low Complexity: 1 Low PT Treatments $Gait Training: 8-22 mins   PT G Codes:        Carmelia Bake, PT, DPT 07/02/2017 Pager: [phone]  York Ram E 07/02/2017, 1:21 PM

## 2017-07-02 NOTE — Progress Notes (Signed)
     Subjective: 1 Day Post-Op Procedure(s) (LRB): RIGHT TOTAL KNEE ARTHROPLASTY (Right)   Patient reports pain as mild / moderate.  Pain mainly controlled.  No events throughout the night.  Plan for possible discharge tomorrow due to underlying medical co-morbidities, pain control and need for inpatient therapy to meet goal of being discharged home safely with family/caregiver.  Objective:   VITALS:   Vitals:   07/02/17 0248 07/02/17 0459  BP:  116/65  Pulse: (!) 56 (!) 55  Resp: 14 15  Temp:  98.1 F (36.7 C)  SpO2: 98% 96%    Dorsiflexion/Plantar flexion intact Incision: dressing C/D/I No cellulitis present Compartment soft  LABS Recent Labs    07/02/17 0558  HGB 10.0*  HCT 30.6*  WBC 9.8  PLT 201    Recent Labs    07/02/17 0558  NA 132*  K 4.3  BUN 10  CREATININE 0.55  GLUCOSE 127*     Assessment/Plan: 1 Day Post-Op Procedure(s) (LRB): RIGHT TOTAL KNEE ARTHROPLASTY (Right) Foley cath d/c'ed Advance diet Up with therapy D/C IV fluids Discharge home, eventually, when ready  Overweight (BMI 25-29.9) Estimated body mass index is 25.13 kg/m as calculated from the following:   Height as of this encounter: 5\' 5"  (1.651 m).   Weight as of this encounter: 68.5 kg (151 lb). Patient also counseled that weight may inhibit the healing process Patient counseled that losing weight will help with future health issues       Crystal Russell. Crystal Russell   PAC  07/02/2017, 8:01 AM

## 2017-07-02 NOTE — Progress Notes (Signed)
Physical Therapy Treatment Patient Details Name: Crystal Russell MRN: [id number] DOB: 11/07/1950 Today's Date: 07/02/2017    History of Present Illness Pt is a 67 year old female s/p R TKA    PT Comments    Pt assisted with ambulating in hallway again and then performed LE exercises.  Ice packs provided end of session.  Follow Up Recommendations  Follow surgeon's recommendation for DC plan and follow-up therapies     Equipment Recommendations  None recommended by PT    Recommendations for Other Services       Precautions / Restrictions Precautions Precautions: Knee Restrictions Other Position/Activity Restrictions: WBAT    Mobility  Bed Mobility Overal bed mobility: Needs Assistance Bed Mobility: Supine to Sit;Sit to Supine     Supine to sit: Min guard Sit to supine: Min assist   General bed mobility comments: verbal cues for pt to self assist, slight assist for R LE onto bed  Transfers Overall transfer level: Needs assistance Equipment used: Rolling walker (2 wheeled) Transfers: Sit to/from Stand Sit to Stand: Min guard         General transfer comment: verbal cues for UE and LE positioning  Ambulation/Gait Ambulation/Gait assistance: Min guard Ambulation Distance (Feet): 80 Feet Assistive device: Rolling walker (2 wheeled) Gait Pattern/deviations: Step-to pattern;Decreased stance time - right;Antalgic     General Gait Details: verbal cues for sequence, RW positioning, step length, posture   Stairs            Wheelchair Mobility    Modified Rankin (Stroke Patients Only)       Balance                                            Cognition Arousal/Alertness: Awake/alert Behavior During Therapy: WFL for tasks assessed/performed Overall Cognitive Status: Within Functional Limits for tasks assessed                                        Exercises Total Joint Exercises Ankle Circles/Pumps: AROM;Both Quad  Sets: AROM;Both;10 reps Short Arc Quad: 10 reps;AROM;Right Heel Slides: AROM;10 reps;Right;Seated Hip ABduction/ADduction: AROM;10 reps;Right Straight Leg Raises: 10 reps;AAROM;Right Goniometric ROM: approximately 80* AROM sitting R knee flexion    General Comments        Pertinent Vitals/Pain Pain Assessment: 0-10 Pain Score: 5  Pain Location: R knee Pain Descriptors / Indicators: Sore;Aching Pain Intervention(s): Repositioned;Monitored during session;Limited activity within patient's tolerance;Ice applied    Home Living Family/patient expects to be discharged to:: Private residence Living Arrangements: Spouse/significant other   Type of Home: House Home Access: Stairs to enter Entrance Stairs-Rails: Right Home Layout: One level Home Equipment: Environmental consultant - 2 wheels      Prior Function Level of Independence: Independent          PT Goals (current goals can now be found in the care plan section) Acute Rehab PT Goals PT Goal Formulation: With patient Time For Goal Achievement: 07/09/17 Potential to Achieve Goals: Good Progress towards PT goals: Progressing toward goals    Frequency    7X/week      PT Plan Current plan remains appropriate    Co-evaluation              AM-PAC PT "6 Clicks" Daily Activity  Outcome Measure  Difficulty  turning over in bed (including adjusting bedclothes, sheets and blankets)?: A Little Difficulty moving from lying on back to sitting on the side of the bed? : A Little Difficulty sitting down on and standing up from a chair with arms (e.g., wheelchair, bedside commode, etc,.)?: Unable Help needed moving to and from a bed to chair (including a wheelchair)?: A Little Help needed walking in hospital room?: A Little Help needed climbing 3-5 steps with a railing? : A Lot 6 Click Score: 15    End of Session Equipment Utilized During Treatment: Gait belt Activity Tolerance: Patient tolerated treatment well Patient left: with call  bell/phone within reach;with family/visitor present;in bed Nurse Communication: Mobility status PT Visit Diagnosis: Other abnormalities of gait and mobility (R26.89)     Time: 6438-3779 PT Time Calculation (min) (ACUTE ONLY): 25 min  Charges:  $Gait Training: 8-22 mins $Therapeutic Exercise: 8-22 mins                    G Codes:     Carmelia Bake, PT, DPT 07/02/2017 Pager: [phone]    York Ram E 07/02/2017, 4:12 PM

## 2017-07-03 LAB — CBC
HCT: 28.2 % — ABNORMAL LOW (ref 36.0–46.0)
Hemoglobin: 9.6 g/dL — ABNORMAL LOW (ref 12.0–15.0)
MCH: 29.4 pg (ref 26.0–34.0)
MCHC: 34 g/dL (ref 30.0–36.0)
MCV: 86.5 fL (ref 78.0–100.0)
PLATELETS: 191 10*3/uL (ref 150–400)
RBC: 3.26 MIL/uL — ABNORMAL LOW (ref 3.87–5.11)
RDW: 12.2 % (ref 11.5–15.5)
WBC: 8.1 10*3/uL (ref 4.0–10.5)

## 2017-07-03 LAB — BASIC METABOLIC PANEL
Anion gap: 7 (ref 5–15)
BUN: 9 mg/dL (ref 6–20)
CO2: 28 mmol/L (ref 22–32)
CREATININE: 0.59 mg/dL (ref 0.44–1.00)
Calcium: 8.9 mg/dL (ref 8.9–10.3)
Chloride: 98 mmol/L — ABNORMAL LOW (ref 101–111)
GFR calc Af Amer: >60 mL/min (ref >60)
GLUCOSE: 106 mg/dL — AB (ref 65–99)
POTASSIUM: 4.4 mmol/L (ref 3.5–5.1)
Sodium: 133 mmol/L — ABNORMAL LOW (ref 135–145)

## 2017-07-03 NOTE — Progress Notes (Signed)
Spoke with patient and spouse at bedside. Confirmed plan for OP PT, already arranged. Has RW but needs a 3n1. Contacted [facility] to deliver to the room. [phone]

## 2017-07-03 NOTE — Progress Notes (Signed)
     Subjective: 2 Days Post-Op Procedure(s) (LRB): RIGHT TOTAL KNEE ARTHROPLASTY (Right)   Patient reports pain as mild, pain controlled. No events throughout the night. Feels that she is doing better than prior to surgery.  Ready to be discharged home.   Objective:   VITALS:   Vitals:   07/02/17 2148 07/03/17 0555  BP: 108/62 111/63  Pulse: (!) 50 (!) 49  Resp: 18 18  Temp: 98.4 F (36.9 C) 97.8 F (36.6 C)  SpO2: 99% 100%    Dorsiflexion/Plantar flexion intact Incision: dressing C/D/I No cellulitis present Compartment soft  LABS Recent Labs    07/02/17 0558 07/03/17 0532  HGB 10.0* 9.6*  HCT 30.6* 28.2*  WBC 9.8 8.1  PLT 201 191    Recent Labs    07/02/17 0558 07/03/17 0532  NA 132* 133*  K 4.3 4.4  BUN 10 9  CREATININE 0.55 0.59  GLUCOSE 127* 106*     Assessment/Plan: 2 Days Post-Op Procedure(s) (LRB): RIGHT TOTAL KNEE ARTHROPLASTY (Right) Up with therapy Discharge home Follow up in 2 weeks at [facility]. Follow up with OLIN,Joei Frangos D in 2 weeks.  Contact information:  [facility] [address], [address] [place]   PAC  07/03/2017, 8:56 AM

## 2017-07-03 NOTE — Progress Notes (Signed)
Patient discharged to home with family. Given all belongings, instructions, prescriptions, equipment. Husband present for teaching. Both verbalized understanding of instructions. All questions answered. Escorted to pov via w/c.

## 2017-07-03 NOTE — Progress Notes (Signed)
Physical Therapy Treatment Patient Details Name: Crystal Russell MRN: [id number] DOB: 25-Jun-1950 Today's Date: 07/03/2017    History of Present Illness Pt is a 67 year old female s/p R TKA    PT Comments    Pt ambulated in hallway and performed steps with spouse assisting holding RW.  Pt feels ready for d/c home today.  Follow Up Recommendations  Follow surgeon's recommendation for DC plan and follow-up therapies     Equipment Recommendations  None recommended by PT    Recommendations for Other Services       Precautions / Restrictions Precautions Precautions: Knee Restrictions Other Position/Activity Restrictions: WBAT    Mobility  Bed Mobility Overal bed mobility: Needs Assistance Bed Mobility: Supine to Sit;Sit to Supine     Supine to sit: Min guard Sit to supine: Min guard   General bed mobility comments: pt able to self assist  Transfers Overall transfer level: Needs assistance Equipment used: Rolling walker (2 wheeled) Transfers: Sit to/from Stand Sit to Stand: Min guard         General transfer comment: verbal cues for UE and LE positioning  Ambulation/Gait Ambulation/Gait assistance: Min guard Ambulation Distance (Feet): 200 Feet Assistive device: Rolling walker (2 wheeled) Gait Pattern/deviations: Step-to pattern;Antalgic     General Gait Details: verbal cues for heel strike and allowing knee flexion, limit "stiff leg" and decrease circumduction; posture   Stairs Stairs: Yes   Stair Management: Step to pattern;Backwards;With walker Number of Stairs: 2 General stair comments: verbal cues for sequence, RW positioning, spoue holding RW, performed twice  Wheelchair Mobility    Modified Rankin (Stroke Patients Only)       Balance                                            Cognition Arousal/Alertness: Awake/alert Behavior During Therapy: WFL for tasks assessed/performed Overall Cognitive Status: Within Functional  Limits for tasks assessed                                           General Comments        Pertinent Vitals/Pain Pain Assessment: 0-10 Pain Score: 5  Pain Location: R knee Pain Descriptors / Indicators: Sore;Aching Pain Intervention(s): Limited activity within patient's tolerance;Repositioned;Monitored during session;Patient requesting pain meds-RN notified;Ice applied    Home Living                      Prior Function            PT Goals (current goals can now be found in the care plan section) Progress towards PT goals: Progressing toward goals    Frequency    7X/week      PT Plan Current plan remains appropriate    Co-evaluation              AM-PAC PT "6 Clicks" Daily Activity  Outcome Measure  Difficulty turning over in bed (including adjusting bedclothes, sheets and blankets)?: A Little Difficulty moving from lying on back to sitting on the side of the bed? : A Little Difficulty sitting down on and standing up from a chair with arms (e.g., wheelchair, bedside commode, etc,.)?: A Little Help needed moving to and from a bed to chair (including a wheelchair)?: A  Little Help needed walking in hospital room?: A Little Help needed climbing 3-5 steps with a railing? : A Little 6 Click Score: 18    End of Session Equipment Utilized During Treatment: Gait belt Activity Tolerance: Patient tolerated treatment well Patient left: with call bell/phone within reach;with family/visitor present;in bed Nurse Communication: Mobility status PT Visit Diagnosis: Other abnormalities of gait and mobility (R26.89)     Time: 8343-7357 PT Time Calculation (min) (ACUTE ONLY): 18 min  Charges:  $Gait Training: 8-22 mins                    G Codes:      Carmelia Bake, PT, DPT 07/03/2017 Pager: [phone]  York Ram E 07/03/2017, 3:22 PM

## 2017-07-03 NOTE — Progress Notes (Signed)
Physical Therapy Treatment Patient Details Name: Crystal Russell MRN: [id number] DOB: December 02, 1950 Today's Date: 07/03/2017    History of Present Illness Pt is a 67 year old female s/p R TKA    PT Comments    Pt ambulated in hallway and performed LE exercises.  Pt provided with HEP handout.  Will return to practice steps prior to d/c home later today.   Follow Up Recommendations  Follow surgeon's recommendation for DC plan and follow-up therapies     Equipment Recommendations  None recommended by PT    Recommendations for Other Services       Precautions / Restrictions Precautions Precautions: Knee Restrictions Other Position/Activity Restrictions: WBAT    Mobility  Bed Mobility Overal bed mobility: Needs Assistance Bed Mobility: Supine to Sit           General bed mobility comments: pt able to self assist  Transfers Overall transfer level: Needs assistance Equipment used: Rolling walker (2 wheeled) Transfers: Sit to/from Stand Sit to Stand: Min guard         General transfer comment: verbal cues for UE and LE positioning  Ambulation/Gait Ambulation/Gait assistance: Min guard Ambulation Distance (Feet): 120 Feet Assistive device: Rolling walker (2 wheeled) Gait Pattern/deviations: Step-to pattern;Antalgic     General Gait Details: verbal cues for heel strike and allowing knee flexion, limit "stiff leg" and decrease circumduction   Stairs            Wheelchair Mobility    Modified Rankin (Stroke Patients Only)       Balance                                            Cognition Arousal/Alertness: Awake/alert Behavior During Therapy: WFL for tasks assessed/performed Overall Cognitive Status: Within Functional Limits for tasks assessed                                        Exercises Total Joint Exercises Ankle Circles/Pumps: AROM;Both;10 reps Quad Sets: AROM;Both;10 reps Short Arc Quad: 10  reps;AROM;Right Heel Slides: AROM;10 reps;Right;Seated Hip ABduction/ADduction: AROM;10 reps;Right Straight Leg Raises: 10 reps;AAROM;Right Goniometric ROM: approximately 80* AROM sitting R knee flexion    General Comments        Pertinent Vitals/Pain Pain Assessment: 0-10 Pain Score: 4  Pain Location: R knee Pain Descriptors / Indicators: Sore;Aching Pain Intervention(s): Limited activity within patient's tolerance;Repositioned;Monitored during session;Ice applied    Home Living                      Prior Function            PT Goals (current goals can now be found in the care plan section) Progress towards PT goals: Progressing toward goals    Frequency    7X/week      PT Plan Current plan remains appropriate    Co-evaluation              AM-PAC PT "6 Clicks" Daily Activity  Outcome Measure  Difficulty turning over in bed (including adjusting bedclothes, sheets and blankets)?: A Little Difficulty moving from lying on back to sitting on the side of the bed? : A Little Difficulty sitting down on and standing up from a chair with arms (e.g., wheelchair, bedside commode, etc,.)?: A  Little Help needed moving to and from a bed to chair (including a wheelchair)?: A Little Help needed walking in hospital room?: A Little Help needed climbing 3-5 steps with a railing? : A Little 6 Click Score: 18    End of Session   Activity Tolerance: Patient tolerated treatment well Patient left: with call bell/phone within reach;with family/visitor present;in chair   PT Visit Diagnosis: Other abnormalities of gait and mobility (R26.89)     Time: 0940-1006 PT Time Calculation (min) (ACUTE ONLY): 26 min  Charges:  $Gait Training: 8-22 mins $Therapeutic Exercise: 8-22 mins                    G Codes:       Carmelia Bake, PT, DPT 07/03/2017 Pager: [phone]  York Ram E 07/03/2017, 1:19 PM

## 2017-07-04 DIAGNOSIS — M25661 Stiffness of right knee, not elsewhere classified: Secondary | ICD-10-CM | POA: Diagnosis not present

## 2017-07-07 DIAGNOSIS — M25661 Stiffness of right knee, not elsewhere classified: Secondary | ICD-10-CM | POA: Diagnosis not present

## 2017-07-07 NOTE — Discharge Summary (Signed)
Physician Discharge Summary  Patient ID: Crystal Russell MRN: [id number] DOB/AGE: Aug 31, 1950 67 y.o.  Admit date: 07/01/2017 Discharge date: 07/03/2017   Procedures:  Procedure(s) (LRB): RIGHT TOTAL KNEE ARTHROPLASTY (Right)  Attending Physician:  Dr. Paralee Cancel   Admission Diagnoses:   Right knee primary OA / pain  Discharge Diagnoses:  Principal Problem:   S/P right TKA Active Problems:   Overweight (BMI 25.0-29.9)   Status post total right knee replacement  Past Medical History:  Diagnosis Date  . Abdominal wall hernia 08/04/2012  . Atrial fibrillation ([place]) 2008   REPORTS SHE WAS AT [place], UPON WAKING I FELT MY HEART GOING BOOM BOOM BOOM BEATING OUT OF MY CHEST. I WENT TO HOSPITAL AND THEY ISED MEDICINE TO CONVERT ME , IT TOOK OVER 10 HOURS TO CONVERT ME. BUT AFTER THAT IVE NEVER HAD ANY ISSUES SINCE. I USED TO HAVE A CARDIOLOGUIST AS MY PRIMARY IN [place] BUT IHAVENT BEEN TO A CARDIOLOGIST SINCE I MOVED DOWN HERE.   Marland Kitchen Chicken pox as a child  . Depression   . Depression with anxiety 12/27/2012  . DJD (degenerative joint disease) 08/05/2012  . DVT (deep venous thrombosis) ([place]) 2018  . Fifth disease 1989   DX AFTER EXPOSURE FROM HER CHILDREN; DEVELOPED RHEUMATOID ARTHRITIS DURING COURSE OF ILLNESS. REPORTS:  " I DONT HAVE IT ANYMORE SINCE THE DISEASE IS GONE"   . H/O gestational diabetes mellitus, not currently pregnant 08/05/2012   History of   . Heart murmur    echo 2012; "I HAVE A FUNCTIONAL MURMUR"   . Hyperlipidemia   . Hypothyroidism   . Knee effusion, right    while on Eliquis, discontinued Eliquis after DVT resolved  . Measles as a child  . Other and unspecified hyperlipidemia 08/05/2012  . Paroxysmal A-fib ([place])   . Shingles 43  . Spondylisthesis 2012   L5/S1 grade 3  . Varicose veins 08/05/2012   Extending up to right groin B/l LE  . Ventral hernia 08/05/2012  . Wears glasses     HPI:    Crystal Russell, 67 y.o. female, has a history of pain and functional  disability in the right knee due to arthritis and has failed non-surgical conservative treatments for greater than 12 weeks to includeNSAID's and/or analgesics, corticosteriod injections and activity modification.  Onset of symptoms was gradual, starting 1+ years ago with gradually worsening course since that time. The patient noted no past surgery on the right knee(s).  Patient currently rates pain in the right knee(s) at 10 out of 10 with activity. Patient has worsening of pain with activity and weight bearing, pain that interferes with activities of daily living, pain with passive range of motion, crepitus and joint swelling.  Patient has evidence of periarticular osteophytes and joint space narrowing by imaging studies.  There is no active infection.  Risks, benefits and expectations were discussed with the patient.  Risks including but not limited to the risk of anesthesia, blood clots, nerve damage, blood vessel damage, failure of the prosthesis, infection and up to and including death.  Patient understand the risks, benefits and expectations and wishes to proceed with surgery.   PCP: Mosie Lukes, MD   Discharged Condition: good  Hospital Course:  Patient underwent the above stated procedure on 07/01/2017. Patient tolerated the procedure well and brought to the recovery room in good condition and subsequently to the floor.  POD #1 BP: 116/65 ; Pulse: 55 ; Temp: 98.1 F (36.7 C) ; Resp: 15  Patient reports pain as mild / moderate.  Pain mainly controlled.  No events throughout the night.  Plan for possible discharge tomorrowdue to underlying medical co-morbidities, pain control and need for inpatient therapy to meet goal of being discharged home safely with family/caregiver. Dorsiflexion/plantar flexion intact, incision: dressing C/D/I, no cellulitis present and compartment soft.   LABS  Basename    HGB     10.0  HCT     30.6   POD #2  BP: 111/63 ; Pulse: 49 ; Temp: 97.8 F (36.6 C) ;  Resp: 18 Patient reports pain as mild, pain controlled. No events throughout the night. Feels that she is doing better than prior to surgery.  Ready to be discharged home.  Dorsiflexion/plantar flexion intact, incision: dressing C/D/I, no cellulitis present and compartment soft.   LABS  Basename    HGB     9.6  HCT     28.2    Discharge Exam: General appearance: alert, cooperative and no distress Extremities: Homans sign is negative, no sign of DVT, no edema, redness or tenderness in the calves or thighs and no ulcers, gangrene or trophic changes  Disposition:  Home with follow up in 2 weeks   Follow-up Information    Paralee Cancel, MD. Schedule an appointment as soon as possible for a visit in 2 weeks.   Specialty:  Orthopedic Surgery Contact information: [address] [place] [address] [phone]        [facility] - Dme Follow up.   Why:  3n1 Contact information: [address] [address] [phone]           Discharge Instructions    Call MD / Call 911   Complete by:  As directed    If you experience chest pain or shortness of breath, CALL 911 and be transported to the hospital emergency room.  If you develope a fever above 101 F, pus (white drainage) or increased drainage or redness at the wound, or calf pain, call your surgeon's office.   Call MD / Call 911   Complete by:  As directed    If you experience chest pain or shortness of breath, CALL 911 and be transported to the hospital emergency room.  If you develope a fever above 101 F, pus (white drainage) or increased drainage or redness at the wound, or calf pain, call your surgeon's office.   Change dressing   Complete by:  As directed    Maintain surgical dressing until follow up in the clinic. If the edges start to pull up, may reinforce with tape. If the dressing is no longer working, may remove and cover with gauze and tape, but must keep the area dry and  clean.  Call with any questions or concerns.   Change dressing   Complete by:  As directed    Maintain surgical dressing until follow up in the clinic. If the edges start to pull up, may reinforce with tape. If the dressing is no longer working, may remove and cover with gauze and tape, but must keep the area dry and clean.  Call with any questions or concerns.   Constipation Prevention   Complete by:  As directed    Drink plenty of fluids.  Prune juice may be helpful.  You may use a stool softener, such as Colace (over the counter) 100 mg twice a day.  Use MiraLax (over the counter) for constipation as needed.   Constipation  Prevention   Complete by:  As directed    Drink plenty of fluids.  Prune juice may be helpful.  You may use a stool softener, such as Colace (over the counter) 100 mg twice a day.  Use MiraLax (over the counter) for constipation as needed.   Diet - low sodium heart healthy   Complete by:  As directed    Diet - low sodium heart healthy   Complete by:  As directed    Discharge instructions   Complete by:  As directed    Maintain surgical dressing until follow up in the clinic. If the edges start to pull up, may reinforce with tape. If the dressing is no longer working, may remove and cover with gauze and tape, but must keep the area dry and clean.  Follow up in 2 weeks at [facility]. Call with any questions or concerns.   Discharge instructions   Complete by:  As directed    Maintain surgical dressing until follow up in the clinic. If the edges start to pull up, may reinforce with tape. If the dressing is no longer working, may remove and cover with gauze and tape, but must keep the area dry and clean.  Follow up in 2 weeks at [facility]. Call with any questions or concerns.   Increase activity slowly as tolerated   Complete by:  As directed    Weight bearing as tolerated with assist device (walker, cane, etc) as directed, use it as long as  suggested by your surgeon or therapist, typically at least 4-6 weeks.   Increase activity slowly as tolerated   Complete by:  As directed    Weight bearing as tolerated with assist device (walker, cane, etc) as directed, use it as long as suggested by your surgeon or therapist, typically at least 4-6 weeks.   TED hose   Complete by:  As directed    Use stockings (TED hose) for 2 weeks on both leg(s).  You may remove them at night for sleeping.   TED hose   Complete by:  As directed    Use stockings (TED hose) for 2 weeks on both leg(s).  You may remove them at night for sleeping.      Allergies as of 07/03/2017      Reactions   Crestor [rosuvastatin] Other (See Comments)   Joint pain w/fibromyalgia   Pravastatin Other (See Comments)   Joint pain w/fibromyalgia   Penicillins Swelling, Rash, Other (See Comments)   Hands and feet swell, rash; childhood allergy  Has patient had a PCN reaction causing immediate rash, facial/tongue/throat swelling, SOB or lightheadedness with hypotension: Unknown Has patient had a PCN reaction causing severe rash involving mucus membranes or skin necrosis: Unknown Has patient had a PCN reaction that required hospitalization: Yes Has patient had a PCN reaction occurring within the last 10 years: No If all of the above answers are "NO", then may proceed with Cephalosporin use.      Medication List    STOP taking these medications   acetaminophen 500 MG tablet Commonly known as:  TYLENOL   aspirin 81 MG tablet Replaced by:  aspirin 81 MG chewable tablet   aspirin-acetaminophen-caffeine 250-250-65 MG tablet Commonly known as:  EXCEDRIN MIGRAINE   polyethylene glycol powder powder Commonly known as:  GLYCOLAX/MIRALAX Replaced by:  polyethylene glycol packet     TAKE these medications   aspirin 81 MG chewable tablet Commonly known as:  ASPIRIN CHILDRENS Chew 1 tablet (81  mg total) by mouth 2 (two) times daily. Take for 4 weeks, then resume regular  dose. Start taking on:  07/18/2017 Replaces:  aspirin 81 MG tablet   atorvastatin 10 MG tablet Commonly known as:  LIPITOR TAKE 1/2 (ONE-HALF) TABLET BY MOUTH ONCE DAILY What changed:  See the new instructions.   Co-Enzyme Q-10 100 MG Caps Take 300 mg by mouth daily.   docusate sodium 100 MG capsule Commonly known as:  COLACE Take 1 capsule (100 mg total) by mouth 2 (two) times daily.   ferrous sulfate 325 (65 FE) MG tablet Commonly known as:  FERROUSUL Take 1 tablet (325 mg total) by mouth 3 (three) times daily with meals.   HYDROcodone-acetaminophen 7.5-325 MG tablet Commonly known as:  NORCO Take 1-2 tablets by mouth every 4 (four) hours as needed for moderate pain.   levothyroxine 88 MCG tablet Commonly known as:  SYNTHROID, LEVOTHROID TAKE 1 TABLET BY MOUTH ONCE DAILY BEFORE BREAKFAST What changed:  See the new instructions.   methocarbamol 500 MG tablet Commonly known as:  ROBAXIN Take 1 tablet (500 mg total) by mouth every 6 (six) hours as needed for muscle spasms.   polyethylene glycol packet Commonly known as:  MIRALAX / GLYCOLAX Take 17 g by mouth 2 (two) times daily. Replaces:  polyethylene glycol powder powder   rivaroxaban 10 MG Tabs tablet Commonly known as:  XARELTO Take 1 tablet (10 mg total) by mouth daily for 14 days.            Discharge Care Instructions  (From admission, onward)        Start     Ordered   07/03/17 0000  Change dressing    Comments:  Maintain surgical dressing until follow up in the clinic. If the edges start to pull up, may reinforce with tape. If the dressing is no longer working, may remove and cover with gauze and tape, but must keep the area dry and clean.  Call with any questions or concerns.   07/03/17 0901   07/02/17 0000  Change dressing    Comments:  Maintain surgical dressing until follow up in the clinic. If the edges start to pull up, may reinforce with tape. If the dressing is no longer working, may remove and  cover with gauze and tape, but must keep the area dry and clean.  Call with any questions or concerns.   07/02/17 0804       Signed: [name]. Leanard Dimaio   PA-C  07/07/2017, 9:27 AM

## 2017-07-09 DIAGNOSIS — M25661 Stiffness of right knee, not elsewhere classified: Secondary | ICD-10-CM | POA: Diagnosis not present

## 2017-07-11 DIAGNOSIS — M25661 Stiffness of right knee, not elsewhere classified: Secondary | ICD-10-CM | POA: Diagnosis not present

## 2017-07-11 NOTE — Telephone Encounter (Signed)
Completed.

## 2017-07-14 DIAGNOSIS — M25661 Stiffness of right knee, not elsewhere classified: Secondary | ICD-10-CM | POA: Diagnosis not present

## 2017-07-16 DIAGNOSIS — M25661 Stiffness of right knee, not elsewhere classified: Secondary | ICD-10-CM | POA: Diagnosis not present

## 2017-07-18 DIAGNOSIS — M25661 Stiffness of right knee, not elsewhere classified: Secondary | ICD-10-CM | POA: Diagnosis not present

## 2017-07-22 DIAGNOSIS — M25661 Stiffness of right knee, not elsewhere classified: Secondary | ICD-10-CM | POA: Diagnosis not present

## 2017-07-24 DIAGNOSIS — M1711 Unilateral primary osteoarthritis, right knee: Secondary | ICD-10-CM | POA: Diagnosis not present

## 2017-07-29 DIAGNOSIS — M25661 Stiffness of right knee, not elsewhere classified: Secondary | ICD-10-CM | POA: Diagnosis not present

## 2017-07-31 DIAGNOSIS — M25661 Stiffness of right knee, not elsewhere classified: Secondary | ICD-10-CM | POA: Diagnosis not present

## 2017-08-05 DIAGNOSIS — M25661 Stiffness of right knee, not elsewhere classified: Secondary | ICD-10-CM | POA: Diagnosis not present

## 2017-08-07 DIAGNOSIS — M25661 Stiffness of right knee, not elsewhere classified: Secondary | ICD-10-CM | POA: Diagnosis not present

## 2017-08-12 DIAGNOSIS — M25661 Stiffness of right knee, not elsewhere classified: Secondary | ICD-10-CM | POA: Diagnosis not present

## 2017-08-14 DIAGNOSIS — M25661 Stiffness of right knee, not elsewhere classified: Secondary | ICD-10-CM | POA: Diagnosis not present

## 2017-08-18 DIAGNOSIS — M25661 Stiffness of right knee, not elsewhere classified: Secondary | ICD-10-CM | POA: Diagnosis not present

## 2017-08-21 DIAGNOSIS — Z471 Aftercare following joint replacement surgery: Secondary | ICD-10-CM | POA: Diagnosis not present

## 2017-08-21 DIAGNOSIS — Z96651 Presence of right artificial knee joint: Secondary | ICD-10-CM | POA: Diagnosis not present

## 2017-08-21 DIAGNOSIS — M25661 Stiffness of right knee, not elsewhere classified: Secondary | ICD-10-CM | POA: Diagnosis not present

## 2017-08-25 DIAGNOSIS — M25661 Stiffness of right knee, not elsewhere classified: Secondary | ICD-10-CM | POA: Diagnosis not present

## 2017-08-27 DIAGNOSIS — M25661 Stiffness of right knee, not elsewhere classified: Secondary | ICD-10-CM | POA: Diagnosis not present

## 2017-09-02 DIAGNOSIS — M25661 Stiffness of right knee, not elsewhere classified: Secondary | ICD-10-CM | POA: Diagnosis not present

## 2017-09-05 DIAGNOSIS — M25561 Pain in right knee: Secondary | ICD-10-CM | POA: Diagnosis not present

## 2017-09-08 DIAGNOSIS — M25661 Stiffness of right knee, not elsewhere classified: Secondary | ICD-10-CM | POA: Diagnosis not present

## 2017-09-10 DIAGNOSIS — M25561 Pain in right knee: Secondary | ICD-10-CM | POA: Diagnosis not present

## 2017-10-23 ENCOUNTER — Other Ambulatory Visit: Payer: Self-pay | Admitting: Family Medicine

## 2017-10-23 DIAGNOSIS — Z1231 Encounter for screening mammogram for malignant neoplasm of breast: Secondary | ICD-10-CM

## 2017-10-25 ENCOUNTER — Other Ambulatory Visit: Payer: Self-pay | Admitting: Family Medicine

## 2017-10-27 ENCOUNTER — Encounter: Payer: Self-pay | Admitting: Family Medicine

## 2017-10-27 MED ORDER — LEVOTHYROXINE SODIUM 88 MCG PO TABS: ORAL_TABLET | ORAL | 1 refills | Status: DC

## 2017-11-03 ENCOUNTER — Other Ambulatory Visit: Payer: Self-pay | Admitting: Family Medicine

## 2017-12-09 ENCOUNTER — Ambulatory Visit
Admission: RE | Admit: 2017-12-09 | Discharge: 2017-12-09 | Disposition: A | Discharge status: Home / Self Care | Payer: Medicare Other | Source: Ambulatory Visit | Attending: Family Medicine | Admitting: Family Medicine

## 2017-12-09 DIAGNOSIS — Z1231 Encounter for screening mammogram for malignant neoplasm of breast: Secondary | ICD-10-CM

## 2017-12-26 DIAGNOSIS — M25562 Pain in left knee: Secondary | ICD-10-CM | POA: Diagnosis not present

## 2017-12-26 DIAGNOSIS — M25561 Pain in right knee: Secondary | ICD-10-CM | POA: Diagnosis not present

## 2017-12-26 DIAGNOSIS — M1712 Unilateral primary osteoarthritis, left knee: Secondary | ICD-10-CM | POA: Diagnosis not present

## 2018-02-09 ENCOUNTER — Ambulatory Visit (INDEPENDENT_AMBULATORY_CARE_PROVIDER_SITE_OTHER): Payer: Medicare Other

## 2018-02-09 DIAGNOSIS — Z23 Encounter for immunization: Secondary | ICD-10-CM

## 2018-02-09 NOTE — Progress Notes (Signed)
Pt. Here for high dose flu vaccine and requests pneumonia injection as well. Dr. Nani Ravens reviewed pt's immunization hx and recommended prevnar-13. Fluzone given in L deltoid IM and prevnar-13 given in R deltoid IM, pt. Tolerated both well.

## 2018-02-16 ENCOUNTER — Telehealth: Payer: Self-pay | Admitting: Medical

## 2018-02-16 ENCOUNTER — Ambulatory Visit (INDEPENDENT_AMBULATORY_CARE_PROVIDER_SITE_OTHER): Payer: Medicare Other | Admitting: Medical

## 2018-02-16 ENCOUNTER — Encounter: Payer: Self-pay | Admitting: Family Medicine

## 2018-02-16 ENCOUNTER — Encounter: Payer: Self-pay | Admitting: Medical

## 2018-02-16 VITALS — BP 110/66 | HR 53 | Temp 98.7°F | Resp 16

## 2018-02-16 DIAGNOSIS — T7840XD Allergy, unspecified, subsequent encounter: Secondary | ICD-10-CM | POA: Diagnosis not present

## 2018-02-16 DIAGNOSIS — L089 Local infection of the skin and subcutaneous tissue, unspecified: Secondary | ICD-10-CM

## 2018-02-16 MED ORDER — DOXYCYCLINE HYCLATE 100 MG PO TABS: 100.0000 mg | ORAL_TABLET | Freq: Two times a day (BID) | ORAL | 0 refills | Status: DC

## 2018-02-16 NOTE — Progress Notes (Signed)
Subjective:    Patient ID: Crystal Russell, female    DOB: March 01, 1951, 67 y.o.   MRN: [id number]  HPI  Pt got pneumovaccine one week ago. Pt states she first noticed redness. Noticed redness 4 days after injection. Pt states redness is expanding. She had soreness day after but did not take note. Checked on the 4th day afterwards since was still achy. No fever, no chills or sweats.  The area itches slightly.  Pt has had skin infection before in past. One event cellulltis following spider bite.   Review of Systems  Constitutional: Negative for chills, fatigue and fever.  Respiratory: Negative for cough, chest tightness, shortness of breath and wheezing.   Cardiovascular: Negative for chest pain and palpitations.  Musculoskeletal: Negative for back pain.       Rt upper arm redness and swelling.  Skin:       See hpi.  Neurological: Negative for dizziness and headaches.  Hematological: Negative for adenopathy. Does not bruise/bleed easily.  Psychiatric/Behavioral: Negative for behavioral problems and confusion.    Past Medical History:  Diagnosis Date  . Abdominal wall hernia 08/04/2012  . Atrial fibrillation ([place]) 2008   REPORTS SHE WAS AT [place], UPON WAKING I FELT MY HEART GOING BOOM BOOM BOOM BEATING OUT OF MY CHEST. I WENT TO HOSPITAL AND THEY ISED MEDICINE TO CONVERT ME , IT TOOK OVER 10 HOURS TO CONVERT ME. BUT AFTER THAT IVE NEVER HAD ANY ISSUES SINCE. I USED TO HAVE A CARDIOLOGUIST AS MY PRIMARY IN [place] BUT IHAVENT BEEN TO A CARDIOLOGIST SINCE I MOVED DOWN HERE.   Marland Kitchen Chicken pox as a child  . Depression   . Depression with anxiety 12/27/2012  . DJD (degenerative joint disease) 08/05/2012  . DVT (deep venous thrombosis) ([place]) 2018  . Fifth disease 1989   DX AFTER EXPOSURE FROM HER CHILDREN; DEVELOPED RHEUMATOID ARTHRITIS DURING COURSE OF ILLNESS. REPORTS:  " I DONT HAVE IT ANYMORE SINCE THE DISEASE IS GONE"   . H/O gestational diabetes mellitus, not currently pregnant 08/05/2012   History of   . Heart murmur    echo 2012; "I HAVE A FUNCTIONAL MURMUR"   . Hyperlipidemia   . Hypothyroidism   . Knee effusion, right    while on Eliquis, discontinued Eliquis after DVT resolved  . Measles as a child  . Other and unspecified hyperlipidemia 08/05/2012  . Paroxysmal A-fib ([place])   . Shingles 43  . Spondylisthesis 2012   L5/S1 grade 3  . Varicose veins 08/05/2012   Extending up to right groin B/l LE  . Ventral hernia 08/05/2012  . Wears glasses      Social History   Socioeconomic History  . Marital status: Married    Spouse name: Not on file  . Number of children: Not on file  . Years of education: Not on file  . Highest education level: Not on file  Occupational History  . Not on file  Social Needs  . Financial resource strain: Not on file  . Food insecurity:    Worry: Not on file    Inability: Not on file  . Transportation needs:    Medical: Not on file    Non-medical: Not on file  Tobacco Use  . Smoking status: Former Smoker    Years: 4.00    Types: Cigarettes  . Smokeless tobacco: Never Used  Substance and Sexual Activity  . Alcohol use: No  . Drug use: No  . Sexual activity: Yes  Lifestyle  .  Physical activity:    Days per week: Not on file    Minutes per session: Not on file  . Stress: Not on file  Relationships  . Social connections:    Talks on phone: Not on file    Gets together: Not on file    Attends religious service: Not on file    Active member of club or organization: Not on file    Attends meetings of clubs or organizations: Not on file    Relationship status: Not on file  . Intimate partner violence:    Fear of current or ex partner: Not on file    Emotionally abused: Not on file    Physically abused: Not on file    Forced sexual activity: Not on file  Other Topics Concern  . Not on file  Social History Narrative  . Not on file    Past Surgical History:  Procedure Laterality Date  . BREAST DUCTAL SYSTEM EXCISION Right  05/05/2014   Procedure: RIGHT BREAST DUCT EXCISION;  Surgeon: Rolm Bookbinder, MD;  Location: [facility];  Service: General;  Laterality: Right;  . COLONOSCOPY    . DILATION AND CURETTAGE OF UTERUS     x4  . TOTAL KNEE ARTHROPLASTY Right 07/01/2017   Procedure: RIGHT TOTAL KNEE ARTHROPLASTY;  Surgeon: Paralee Cancel, MD;  Location: [facility];  Service: Orthopedics;  Laterality: Right;  70 mins  . [place]   right leg    Family History  Problem Relation Age of Onset  . Cancer Mother        lung- smoker  . Liver disease Mother   . Cancer Father        lung- smoker  . Peripheral Artery Disease Sister   . Dementia Maternal Grandmother   . Heart attack Maternal Grandfather   . Asthma Son   . Ulcerative colitis Son   . Asthma Son   . Ulcerative colitis Son   . Heart attack Paternal Uncle     Allergies  Allergen Reactions  . Crestor [Rosuvastatin] Other (See Comments)    Joint pain w/fibromyalgia  . Pravastatin Other (See Comments)    Joint pain w/fibromyalgia  . Penicillins Swelling, Rash and Other (See Comments)    Hands and feet swell, rash; childhood allergy  Has patient had a PCN reaction causing immediate rash, facial/tongue/throat swelling, SOB or lightheadedness with hypotension: Unknown Has patient had a PCN reaction causing severe rash involving mucus membranes or skin necrosis: Unknown Has patient had a PCN reaction that required hospitalization: Yes Has patient had a PCN reaction occurring within the last 10 years: No If all of the above answers are "NO", then may proceed with Cephalosporin use.     Current Outpatient Medications on File Prior to Visit  Medication Sig Dispense Refill  . atorvastatin (LIPITOR) 10 MG tablet TAKE 1/2 (ONE-HALF) TABLET BY MOUTH ONCE DAILY 45 tablet 1  . Co-Enzyme Q-10 100 MG CAPS Take 300 mg by mouth daily.    Marland Kitchen levothyroxine (SYNTHROID, LEVOTHROID) 88 MCG tablet TAKE 88 MCG BY MOUTH ONCE DAILY BEFORE  BREAKFAST 90 tablet 1  . polyethylene glycol (MIRALAX / GLYCOLAX) packet Take 17 g by mouth 2 (two) times daily. 14 each 0   No current facility-administered medications on file prior to visit.     BP 110/66   Pulse (!) 53   Temp 98.7 F (37.1 C) (Oral)   Resp 16   SpO2 100%  Objective:   Physical Exam  General- No acute distress. Pleasant patient. Neck- Full range of motion, no jvd Lungs- Clear, even and unlabored. Heart- regular rate and rhythm. Neurologic- CNII- XII grossly intact.   Rt ext- upper arm. 8 cm x 2.5 cm. Warm, tender and indurated. No lymph nodes palpated.      Assessment & Plan:  For skin infection/cellulitis I am prescribing you doxycycline antibiotic. Rx advisement given. If redness expands or worsens please notify us. If severe expansion then ED evaluation.  For itching rx benadryl antihistamine.  Follow up in one week or as needed. Any questions can also my chart as well and send picture.    Mackie Pai, PA-C

## 2018-02-16 NOTE — Patient Instructions (Addendum)
For skin infection/cellulitis I am prescribing you doxycycline antibiotic. Rx advisement given. If redness expands or worsens please notify us. If severe expansion then ED evaluation.  For itching rx benadryl antihistamine.  Follow up in one week or as needed. Any questions can also my chart as well and send picture.

## 2018-02-16 NOTE — Telephone Encounter (Signed)
Spoke with pt and scheduled OV for today at 3:40pm with PA, S[place].

## 2018-02-16 NOTE — Telephone Encounter (Signed)
Please make sure you are getting heights and weighs on all patients.

## 2018-02-17 NOTE — Telephone Encounter (Signed)
There Is a note in chart that says pt declined. Pt did not want to walk over in area.

## 2018-02-23 ENCOUNTER — Ambulatory Visit: Payer: Medicare Other | Admitting: Medical

## 2018-04-28 ENCOUNTER — Encounter: Payer: Self-pay | Admitting: Family Medicine

## 2018-05-06 ENCOUNTER — Other Ambulatory Visit: Payer: Self-pay | Admitting: Family Medicine

## 2018-05-08 ENCOUNTER — Other Ambulatory Visit: Payer: Self-pay | Admitting: Family Medicine

## 2018-08-14 ENCOUNTER — Encounter: Payer: Self-pay | Admitting: Family Medicine

## 2018-08-17 ENCOUNTER — Encounter: Payer: Self-pay | Admitting: Family Medicine

## 2018-09-02 ENCOUNTER — Other Ambulatory Visit: Payer: Self-pay | Admitting: Family Medicine

## 2018-10-02 ENCOUNTER — Ambulatory Visit (INDEPENDENT_AMBULATORY_CARE_PROVIDER_SITE_OTHER): Payer: Medicare Other | Admitting: Internal Medicine

## 2018-10-02 ENCOUNTER — Encounter: Payer: Self-pay | Admitting: Family Medicine

## 2018-10-02 ENCOUNTER — Other Ambulatory Visit: Payer: Self-pay

## 2018-10-02 DIAGNOSIS — S00462A Insect bite (nonvenomous) of left ear, initial encounter: Secondary | ICD-10-CM

## 2018-10-02 DIAGNOSIS — W57XXXA Bitten or stung by nonvenomous insect and other nonvenomous arthropods, initial encounter: Secondary | ICD-10-CM | POA: Diagnosis not present

## 2018-10-02 MED ORDER — DOXYCYCLINE HYCLATE 100 MG PO TABS: 100.0000 mg | ORAL_TABLET | Freq: Two times a day (BID) | ORAL | 0 refills | Status: DC

## 2018-10-02 NOTE — Progress Notes (Signed)
Subjective:    Patient ID: Crystal Russell, female    DOB: [birth year], 68 y.o.   MRN: [id number]  DOS:  10/02/2018 Type of visit - description: Virtual Visit via Video Note  I connected with@ on 10/04/18 at  3:40 PM EDT by a video enabled telemedicine application and verified that I am speaking with the correct person using two identifiers.   THIS ENCOUNTER IS A VIRTUAL VISIT DUE TO COVID-19 - PATIENT WAS NOT SEEN IN THE OFFICE. PATIENT HAS CONSENTED TO VIRTUAL VISIT / TELEMEDICINE VISIT   Location of patient: home  Location of provider: office  I discussed the limitations of evaluation and management by telemedicine and the availability of in person appointments. The patient expressed understanding and agreed to proceed.  History of Present Illness: Acute Yesterday, she was picking blueberries and shortly after her left ear got irritated, itchy, swollen, red, throbbing. She is suspect insect or spider bite because she saw a lot of spider webs in the area. She sent two pictures, the right ear is for comparison.  Review of Systems Denies fever chills No decreased hearing No blisters No ear discharge  Past Medical History:  Diagnosis Date  . Abdominal wall hernia 08/04/2012  . Atrial fibrillation ([place]) 2008   REPORTS SHE WAS AT [place], UPON WAKING I FELT MY HEART GOING BOOM BOOM BOOM BEATING OUT OF MY CHEST. I WENT TO HOSPITAL AND THEY ISED MEDICINE TO CONVERT ME , IT TOOK OVER 10 HOURS TO CONVERT ME. BUT AFTER THAT IVE NEVER HAD ANY ISSUES SINCE. I USED TO HAVE A CARDIOLOGUIST AS MY PRIMARY IN [place] BUT IHAVENT BEEN TO A CARDIOLOGIST SINCE I MOVED DOWN HERE.   Marland Kitchen Chicken pox as a child  . Depression   . Depression with anxiety 12/27/2012  . DJD (degenerative joint disease) 08/05/2012  . DVT (deep venous thrombosis) ([place]) 2018  . Fifth disease 1989   DX AFTER EXPOSURE FROM HER CHILDREN; DEVELOPED RHEUMATOID ARTHRITIS DURING COURSE OF ILLNESS. REPORTS:  " I DONT HAVE IT ANYMORE SINCE  THE DISEASE IS GONE"   . H/O gestational diabetes mellitus, not currently pregnant 08/05/2012   History of   . Heart murmur    echo 2012; "I HAVE A FUNCTIONAL MURMUR"   . Hyperlipidemia   . Hypothyroidism   . Knee effusion, right    while on Eliquis, discontinued Eliquis after DVT resolved  . Measles as a child  . Other and unspecified hyperlipidemia 08/05/2012  . Paroxysmal A-fib ([place])   . Shingles 43  . Spondylisthesis 2012   L5/S1 grade 3  . Varicose veins 08/05/2012   Extending up to right groin B/l LE  . Ventral hernia 08/05/2012  . Wears glasses     Past Surgical History:  Procedure Laterality Date  . BREAST DUCTAL SYSTEM EXCISION Right 05/05/2014   Procedure: RIGHT BREAST DUCT EXCISION;  Surgeon: Rolm Bookbinder, MD;  Location: [facility];  Service: General;  Laterality: Right;  . COLONOSCOPY    . DILATION AND CURETTAGE OF UTERUS     x4  . TOTAL KNEE ARTHROPLASTY Right 07/01/2017   Procedure: RIGHT TOTAL KNEE ARTHROPLASTY;  Surgeon: Paralee Cancel, MD;  Location: [facility];  Service: Orthopedics;  Laterality: Right;  70 mins  . [place]   right leg    Social History   Socioeconomic History  . Marital status: Married    Spouse name: Not on file  . Number of children: Not on file  .  Years of education: Not on file  . Highest education level: Not on file  Occupational History  . Not on file  Social Needs  . Financial resource strain: Not on file  . Food insecurity    Worry: Not on file    Inability: Not on file  . Transportation needs    Medical: Not on file    Non-medical: Not on file  Tobacco Use  . Smoking status: Former Smoker    Years: 4.00    Types: Cigarettes  . Smokeless tobacco: Never Used  Substance and Sexual Activity  . Alcohol use: No  . Drug use: No  . Sexual activity: Yes  Lifestyle  . Physical activity    Days per week: Not on file    Minutes per session: Not on file  . Stress: Not on file  Relationships   . Social Herbalist on phone: Not on file    Gets together: Not on file    Attends religious service: Not on file    Active member of club or organization: Not on file    Attends meetings of clubs or organizations: Not on file    Relationship status: Not on file  . Intimate partner violence    Fear of current or ex partner: Not on file    Emotionally abused: Not on file    Physically abused: Not on file    Forced sexual activity: Not on file  Other Topics Concern  . Not on file  Social History Narrative  . Not on file      Allergies as of 10/02/2018      Reactions   Crestor [rosuvastatin] Other (See Comments)   Joint pain w/fibromyalgia   Pravastatin Other (See Comments)   Joint pain w/fibromyalgia   Penicillins Swelling, Rash, Other (See Comments)   Hands and feet swell, rash; childhood allergy  Has patient had a PCN reaction causing immediate rash, facial/tongue/throat swelling, SOB or lightheadedness with hypotension: Unknown Has patient had a PCN reaction causing severe rash involving mucus membranes or skin necrosis: Unknown Has patient had a PCN reaction that required hospitalization: Yes Has patient had a PCN reaction occurring within the last 10 years: No If all of the above answers are "NO", then may proceed with Cephalosporin use.      Medication List       Accurate as of October 02, 2018 11:59 PM. If you have any questions, ask your nurse or doctor.        atorvastatin 10 MG tablet Commonly known as: LIPITOR Take 1/2 (one-half) tablet by mouth once daily   Co-Enzyme Q-10 100 MG Caps Take 300 mg by mouth daily.   doxycycline 100 MG tablet Commonly known as: VIBRA-TABS Take 1 tablet (100 mg total) by mouth 2 (two) times daily.   levothyroxine 88 MCG tablet Commonly known as: SYNTHROID TAKE ONE TABLET BY MOUTH ONE TIME DAILY  BEFORE BREAKFAST   polyethylene glycol 17 g packet Commonly known as: MIRALAX / GLYCOLAX Take 17 g by mouth 2 (two)  times daily.           Objective:   Physical Exam There were no vitals taken for this visit.    This is a virtual video visit, she is alert oriented x3, no apparent distress.     Assessment    68 year old female, history of penicillin allergy, presents with: Insect bite? Insect or spider bite the left ear, see HPI.  The ear is swollen,  red. Denies fever chills or blisters. She is concerned about cellulitis because last year she developed cellulitis after a insect bite.  I agree with her concerns Plan: Doxycycline, OTC hydrocortisone topically, declined need oral prednisone. Definitely call if not better, ER if the redness is spread locally.

## 2018-10-02 NOTE — Telephone Encounter (Signed)
Spoke with pt and scheduled doxy visit this afternoon at 3:40pm with Dr Larose Kells.

## 2018-10-23 ENCOUNTER — Other Ambulatory Visit: Payer: Self-pay | Admitting: Family Medicine

## 2018-10-27 ENCOUNTER — Other Ambulatory Visit: Payer: Self-pay | Admitting: Family Medicine

## 2018-12-28 ENCOUNTER — Other Ambulatory Visit: Payer: Self-pay | Admitting: Family Medicine

## 2018-12-31 DIAGNOSIS — Z23 Encounter for immunization: Secondary | ICD-10-CM | POA: Diagnosis not present

## 2019-02-10 ENCOUNTER — Other Ambulatory Visit: Payer: Self-pay | Admitting: Family Medicine

## 2019-03-01 ENCOUNTER — Encounter: Payer: Self-pay | Admitting: Family Medicine

## 2019-03-15 ENCOUNTER — Encounter: Payer: Self-pay | Admitting: *Deleted

## 2019-03-15 ENCOUNTER — Ambulatory Visit (INDEPENDENT_AMBULATORY_CARE_PROVIDER_SITE_OTHER): Payer: Medicare Other | Admitting: Family Medicine

## 2019-03-15 ENCOUNTER — Other Ambulatory Visit: Payer: Self-pay

## 2019-03-15 ENCOUNTER — Telehealth: Payer: Self-pay | Admitting: *Deleted

## 2019-03-15 DIAGNOSIS — F418 Other specified anxiety disorders: Secondary | ICD-10-CM | POA: Diagnosis not present

## 2019-03-15 DIAGNOSIS — E782 Mixed hyperlipidemia: Secondary | ICD-10-CM | POA: Diagnosis not present

## 2019-03-15 DIAGNOSIS — E039 Hypothyroidism, unspecified: Secondary | ICD-10-CM | POA: Diagnosis not present

## 2019-03-15 MED ORDER — ATORVASTATIN CALCIUM 10 MG PO TABS: ORAL_TABLET | ORAL | 0 refills | Status: DC

## 2019-03-15 MED ORDER — LEVOTHYROXINE SODIUM 88 MCG PO TABS: ORAL_TABLET | ORAL | 0 refills | Status: DC

## 2019-03-15 NOTE — Telephone Encounter (Signed)
Left message on machine to call back to schedule lab appt with 1 week and cpe in 6-7 months.

## 2019-03-15 NOTE — Assessment & Plan Note (Signed)
On Levothyroxine, continue to monitor 

## 2019-03-16 ENCOUNTER — Telehealth: Payer: Self-pay

## 2019-03-16 NOTE — Telephone Encounter (Signed)
Copied from [place] [id number]. Topic: Quick Communication - See Telephone Encounter >> Mar 15, 2019  5:26 PM Loma Boston wrote: CRM for notification. See Telephone encounter for: 03/15/19. Pls call pt about sch a CPE 6 or 7 mo out per Erline Levine Sch would not take... told her office contact am, after hrs

## 2019-03-22 ENCOUNTER — Other Ambulatory Visit: Payer: Self-pay

## 2019-03-22 ENCOUNTER — Other Ambulatory Visit (INDEPENDENT_AMBULATORY_CARE_PROVIDER_SITE_OTHER): Payer: Medicare Other

## 2019-03-22 DIAGNOSIS — F418 Other specified anxiety disorders: Secondary | ICD-10-CM | POA: Diagnosis not present

## 2019-03-22 DIAGNOSIS — E039 Hypothyroidism, unspecified: Secondary | ICD-10-CM | POA: Diagnosis not present

## 2019-03-22 DIAGNOSIS — E782 Mixed hyperlipidemia: Secondary | ICD-10-CM

## 2019-03-22 LAB — CBC
HCT: 36.3 % (ref 36.0–46.0)
Hemoglobin: 12.1 g/dL (ref 12.0–15.0)
MCHC: 33.3 g/dL (ref 30.0–36.0)
MCV: 87.7 fl (ref 78.0–100.0)
Platelets: 260 10*3/uL (ref 150.0–400.0)
RBC: 4.14 Mil/uL (ref 3.87–5.11)
RDW: 13.1 % (ref 11.5–15.5)
WBC: 5 10*3/uL (ref 4.0–10.5)

## 2019-03-22 LAB — COMPREHENSIVE METABOLIC PANEL
ALT: 26 U/L (ref 0–35)
AST: 28 U/L (ref 0–37)
Albumin: 4 g/dL (ref 3.5–5.2)
Alkaline Phosphatase: 72 U/L (ref 39–117)
BUN: 19 mg/dL (ref 6–23)
CO2: 31 mEq/L (ref 19–32)
Calcium: 9.3 mg/dL (ref 8.4–10.5)
Chloride: 99 mEq/L (ref 96–112)
Creatinine, Ser: 0.68 mg/dL (ref 0.40–1.20)
GFR: 85.97 mL/min (ref >60.00)
Glucose, Bld: 90 mg/dL (ref 70–99)
Potassium: 4.3 mEq/L (ref 3.5–5.1)
Sodium: 134 mEq/L — ABNORMAL LOW (ref 135–145)
Total Bilirubin: 0.7 mg/dL (ref 0.2–1.2)
Total Protein: 6.2 g/dL (ref 6.0–8.3)

## 2019-03-22 LAB — TSH: TSH: 7.38 u[IU]/mL — ABNORMAL HIGH (ref 0.35–4.50)

## 2019-03-22 LAB — LIPID PANEL
Cholesterol: 172 mg/dL (ref 0–200)
HDL: 58.7 mg/dL (ref >39.00)
LDL Cholesterol: 102 mg/dL — ABNORMAL HIGH (ref 0–99)
NonHDL: 113.54
Total CHOL/HDL Ratio: 3
Triglycerides: 59 mg/dL (ref 0.0–149.0)
VLDL: 11.8 mg/dL (ref 0.0–40.0)

## 2019-03-22 NOTE — Assessment & Plan Note (Addendum)
Encouraged heart healthy diet, increase exercise, avoid trans fats, consider a krill oil cap daily. Tolerating Atorvastatin 

## 2019-03-22 NOTE — Progress Notes (Signed)
Virtual Visit via Video Note  I connected with Crystal Russell on 03/15/19 at  2:40 PM EST by a video enabled telemedicine application and verified that I am speaking with the correct person using two identifiers.  Location: Patient: home Provider: office   I discussed the limitations of evaluation and management by telemedicine and the availability of in person appointments. The patient expressed understanding and agreed to proceed. CMA was able to get the patient set up on a video visit   Subjective:    Patient ID: Crystal Russell, female    DOB: 30-Sep-1950, 68 y.o.   MRN: [id number]  Chief Complaint  Patient presents with  . Hypothyroidism    wants med sent to [facility] if you send in.    HPI Patient is in today for follow up on hypothyrodisma and hyperlipidemia. She feels well. No recent febrile illness or hospitalizations. She has been maintaining quarantine. She is eating a heart healthy diet and staying active. Denies CP/palp/SOB/HA/congestion/fevers/GI or GU c/o. Taking meds as prescribed  Past Medical History:  Diagnosis Date  . Abdominal wall hernia 08/04/2012  . Atrial fibrillation ([place]) 2008   REPORTS SHE WAS AT [place], UPON WAKING I FELT MY HEART GOING BOOM BOOM BOOM BEATING OUT OF MY CHEST. I WENT TO HOSPITAL AND THEY ISED MEDICINE TO CONVERT ME , IT TOOK OVER 10 HOURS TO CONVERT ME. BUT AFTER THAT IVE NEVER HAD ANY ISSUES SINCE. I USED TO HAVE A CARDIOLOGUIST AS MY PRIMARY IN [place] BUT IHAVENT BEEN TO A CARDIOLOGIST SINCE I MOVED DOWN HERE.   Marland Kitchen Chicken pox as a child  . Depression   . Depression with anxiety 12/27/2012  . DJD (degenerative joint disease) 08/05/2012  . DVT (deep venous thrombosis) ([place]) 2018  . Fifth disease 1989   DX AFTER EXPOSURE FROM HER CHILDREN; DEVELOPED RHEUMATOID ARTHRITIS DURING COURSE OF ILLNESS. REPORTS:  " I DONT HAVE IT ANYMORE SINCE THE DISEASE IS GONE"   . H/O gestational diabetes mellitus, not currently pregnant 08/05/2012   History of   . Heart  murmur    echo 2012; "I HAVE A FUNCTIONAL MURMUR"   . Hyperlipidemia   . Hypothyroidism   . Knee effusion, right    while on Eliquis, discontinued Eliquis after DVT resolved  . Measles as a child  . Other and unspecified hyperlipidemia 08/05/2012  . Paroxysmal A-fib ([place])   . Shingles 43  . Spondylisthesis 2012   L5/S1 grade 3  . Varicose veins 08/05/2012   Extending up to right groin B/l LE  . Ventral hernia 08/05/2012  . Wears glasses     Past Surgical History:  Procedure Laterality Date  . BREAST DUCTAL SYSTEM EXCISION Right 05/05/2014   Procedure: RIGHT BREAST DUCT EXCISION;  Surgeon: Rolm Bookbinder, MD;  Location: [facility];  Service: General;  Laterality: Right;  . COLONOSCOPY    . DILATION AND CURETTAGE OF UTERUS     x4  . TOTAL KNEE ARTHROPLASTY Right 07/01/2017   Procedure: RIGHT TOTAL KNEE ARTHROPLASTY;  Surgeon: Paralee Cancel, MD;  Location: [facility];  Service: Orthopedics;  Laterality: Right;  70 mins  . [place]   right leg    Family History  Problem Relation Age of Onset  . Cancer Mother        lung- smoker  . Liver disease Mother   . Cancer Father        lung- smoker  . Peripheral Artery Disease Sister   .  Dementia Maternal Grandmother   . Heart attack Maternal Grandfather   . Asthma Son   . Ulcerative colitis Son   . Asthma Son   . Ulcerative colitis Son   . Heart attack Paternal Uncle     Social History   Socioeconomic History  . Marital status: Married    Spouse name: Not on file  . Number of children: Not on file  . Years of education: Not on file  . Highest education level: Not on file  Occupational History  . Not on file  Tobacco Use  . Smoking status: Former Smoker    Years: 4.00    Types: Cigarettes  . Smokeless tobacco: Never Used  Substance and Sexual Activity  . Alcohol use: No  . Drug use: No  . Sexual activity: Yes  Other Topics Concern  . Not on file  Social History Narrative  . Not on  file   Social Determinants of Health   Financial Resource Strain:   . Difficulty of Paying Living Expenses: Not on file  Food Insecurity:   . Worried About Charity fundraiser in the Last Year: Not on file  . Ran Out of Food in the Last Year: Not on file  Transportation Needs:   . Lack of Transportation (Medical): Not on file  . Lack of Transportation (Non-Medical): Not on file  Physical Activity:   . Days of Exercise per Week: Not on file  . Minutes of Exercise per Session: Not on file  Stress:   . Feeling of Stress : Not on file  Social Connections:   . Frequency of Communication with Friends and Family: Not on file  . Frequency of Social Gatherings with Friends and Family: Not on file  . Attends Religious Services: Not on file  . Active Member of Clubs or Organizations: Not on file  . Attends Archivist Meetings: Not on file  . Marital Status: Not on file  Intimate Partner Violence:   . Fear of Current or Ex-Partner: Not on file  . Emotionally Abused: Not on file  . Physically Abused: Not on file  . Sexually Abused: Not on file    Outpatient Medications Prior to Visit  Medication Sig Dispense Refill  . Co-Enzyme Q-10 100 MG CAPS Take 300 mg by mouth daily.    . polyethylene glycol (MIRALAX / GLYCOLAX) packet Take 17 g by mouth 2 (two) times daily. 14 each 0  . atorvastatin (LIPITOR) 10 MG tablet Take 1/2 (one-half) tablet by mouth once daily 45 tablet 0  . levothyroxine (SYNTHROID) 88 MCG tablet TAKE 1 TABLET BY MOUTH ONCE A DAY BEFORE BREAKFAST.  Needs ov before any more refills 30 tablet 0  . doxycycline (VIBRA-TABS) 100 MG tablet Take 1 tablet (100 mg total) by mouth 2 (two) times daily. 15 tablet 0   No facility-administered medications prior to visit.    Allergies  Allergen Reactions  . Crestor [Rosuvastatin] Other (See Comments)    Joint pain w/fibromyalgia  . Pravastatin Other (See Comments)    Joint pain w/fibromyalgia  . Penicillins Swelling, Rash  and Other (See Comments)    Hands and feet swell, rash; childhood allergy  Has patient had a PCN reaction causing immediate rash, facial/tongue/throat swelling, SOB or lightheadedness with hypotension: Unknown Has patient had a PCN reaction causing severe rash involving mucus membranes or skin necrosis: Unknown Has patient had a PCN reaction that required hospitalization: Yes Has patient had a PCN reaction occurring within the last  10 years: No If all of the above answers are "NO", then may proceed with Cephalosporin use.     Review of Systems  Constitutional: Negative for fever and malaise/fatigue.  HENT: Negative for congestion.   Eyes: Negative for blurred vision.  Respiratory: Negative for shortness of breath.   Cardiovascular: Negative for chest pain, palpitations and leg swelling.  Gastrointestinal: Negative for abdominal pain, blood in stool and nausea.  Genitourinary: Negative for dysuria and frequency.  Musculoskeletal: Negative for falls.  Skin: Negative for rash.  Neurological: Negative for dizziness, loss of consciousness and headaches.  Endo/Heme/Allergies: Negative for environmental allergies.  Psychiatric/Behavioral: Negative for depression. The patient is not nervous/anxious.        Objective:    Physical Exam Constitutional:      Appearance: Normal appearance. She is not ill-appearing.  HENT:     Head: Normocephalic and atraumatic.     Nose: Nose normal.  Eyes:     General:        Right eye: No discharge.        Left eye: No discharge.  Pulmonary:     Effort: Pulmonary effort is normal.  Neurological:     Mental Status: She is alert and oriented to person, place, and time.  Psychiatric:        Mood and Affect: Mood normal.        Behavior: Behavior normal.     There were no vitals taken for this visit. Wt Readings from Last 3 Encounters:  07/01/17 151 lb (68.5 kg)  06/24/17 151 lb 12.8 oz (68.9 kg)  06/16/17 152 lb (68.9 kg)    Diabetic Foot  Exam - Simple   No data filed     Lab Results  Component Value Date   WBC 5.0 03/22/2019   HGB 12.1 03/22/2019   HCT 36.3 03/22/2019   PLT 260.0 03/22/2019   GLUCOSE 90 03/22/2019   CHOL 172 03/22/2019   TRIG 59.0 03/22/2019   HDL 58.70 03/22/2019   LDLCALC 102 (H) 03/22/2019   ALT 26 03/22/2019   AST 28 03/22/2019   NA 134 (L) 03/22/2019   K 4.3 03/22/2019   CL 99 03/22/2019   CREATININE 0.68 03/22/2019   BUN 19 03/22/2019   CO2 31 03/22/2019   TSH 7.38 (H) 03/22/2019   HGBA1C 5.5 10/26/2014    Lab Results  Component Value Date   TSH 7.38 (H) 03/22/2019   Lab Results  Component Value Date   WBC 5.0 03/22/2019   HGB 12.1 03/22/2019   HCT 36.3 03/22/2019   MCV 87.7 03/22/2019   PLT 260.0 03/22/2019   Lab Results  Component Value Date   NA 134 (L) 03/22/2019   K 4.3 03/22/2019   CO2 31 03/22/2019   GLUCOSE 90 03/22/2019   BUN 19 03/22/2019   CREATININE 0.68 03/22/2019   BILITOT 0.7 03/22/2019   ALKPHOS 72 03/22/2019   AST 28 03/22/2019   ALT 26 03/22/2019   PROT 6.2 03/22/2019   ALBUMIN 4.0 03/22/2019   CALCIUM 9.3 03/22/2019   ANIONGAP 7 07/03/2017   GFR 85.97 03/22/2019   Lab Results  Component Value Date   CHOL 172 03/22/2019   Lab Results  Component Value Date   HDL 58.70 03/22/2019   Lab Results  Component Value Date   LDLCALC 102 (H) 03/22/2019   Lab Results  Component Value Date   TRIG 59.0 03/22/2019   Lab Results  Component Value Date   CHOLHDL 3 03/22/2019  Lab Results  Component Value Date   HGBA1C 5.5 10/26/2014       Assessment & Plan:   Problem List Items Addressed This Visit    Hyperlipidemia, mixed    Encouraged heart healthy diet, increase exercise, avoid trans fats, consider a krill oil cap daily. Tolerating Atorvastatin.       Relevant Medications   atorvastatin (LIPITOR) 10 MG tablet   Other Relevant Orders   CMP (Completed)   Lipid panel (Completed)   CBC (Completed)   Depression with anxiety - Primary    Relevant Orders   CBC (Completed)   TSH (Completed)   Hypothyroidism    On Levothyroxine, continue to monitor.      Relevant Medications   levothyroxine (SYNTHROID) 88 MCG tablet   Other Relevant Orders   CBC (Completed)      I have discontinued Ivin Booty Capizzi's doxycycline. I am also having her maintain her Co-Enzyme Q-10, polyethylene glycol, atorvastatin, and levothyroxine.  Meds ordered this encounter  Medications  . atorvastatin (LIPITOR) 10 MG tablet    Sig: Take 1/2 (one-half) tablet by mouth once daily    Dispense:  45 tablet    Refill:  0  . levothyroxine (SYNTHROID) 88 MCG tablet    Sig: TAKE 1 TABLET BY MOUTH ONCE A DAY BEFORE BREAKFAST.  Needs ov before any more refills    Dispense:  90 tablet    Refill:  0      I discussed the assessment and treatment plan with the patient. The patient was provided an opportunity to ask questions and all were answered. The patient agreed with the plan and demonstrated an understanding of the instructions.   The patient was advised to call back or seek an in-person evaluation if the symptoms worsen or if the condition fails to improve as anticipated.  I provided 15 minutes of non-face-to-face time during this encounter.   Penni Homans, MD

## 2019-03-23 ENCOUNTER — Encounter: Payer: Self-pay | Admitting: Family Medicine

## 2019-03-23 MED ORDER — LEVOTHYROXINE SODIUM 100 MCG PO TABS: 100.0000 ug | ORAL_TABLET | Freq: Every day | ORAL | 3 refills | Status: DC

## 2019-03-23 NOTE — Addendum Note (Signed)
Addended by: Magdalene Molly A on: 03/23/2019 12:49 PM   Modules accepted: Orders

## 2019-04-13 ENCOUNTER — Other Ambulatory Visit: Payer: Medicare Other

## 2019-04-24 ENCOUNTER — Encounter: Payer: Self-pay | Admitting: Family Medicine

## 2019-05-07 ENCOUNTER — Ambulatory Visit: Payer: Medicare Other

## 2019-05-13 ENCOUNTER — Encounter: Payer: Self-pay | Admitting: Family Medicine

## 2019-05-14 ENCOUNTER — Ambulatory Visit: Payer: Medicare Other

## 2019-05-24 ENCOUNTER — Ambulatory Visit: Payer: Medicare Other

## 2019-06-17 ENCOUNTER — Encounter: Payer: Self-pay | Admitting: Family Medicine

## 2019-06-18 NOTE — Telephone Encounter (Signed)
Dr Charlett Blake -- is it ok to add CBC to Monday's labs for pt's fatigue? Notified pt of PCP response and she is agreeable to try Pepcid and probiotic. She received the [place] 19 vaccines on 2/5 and 2/26 through [facility] at the [facility].

## 2019-06-18 NOTE — Telephone Encounter (Signed)
Dr Charlett Blake --I spoke with pt to do screening questions prior to her lab visit on Monday morning. She states she has been waking up with nausea every morning for the last 3 weeks, has noticed an increase of bloating / gas as well. Pt answered no to all other questions. What should pt do next and is it still ok for pt to do her lab visit on Monday? Pt requests that we call her back on her cell [phone]).  Please advise?

## 2019-06-20 ENCOUNTER — Other Ambulatory Visit: Payer: Self-pay | Admitting: Family Medicine

## 2019-06-20 DIAGNOSIS — R5383 Other fatigue: Secondary | ICD-10-CM

## 2019-06-20 DIAGNOSIS — R1013 Epigastric pain: Secondary | ICD-10-CM

## 2019-06-21 ENCOUNTER — Other Ambulatory Visit: Payer: Self-pay

## 2019-06-21 ENCOUNTER — Other Ambulatory Visit: Payer: Self-pay | Admitting: Family Medicine

## 2019-06-21 ENCOUNTER — Other Ambulatory Visit (INDEPENDENT_AMBULATORY_CARE_PROVIDER_SITE_OTHER): Payer: Medicare Other

## 2019-06-21 DIAGNOSIS — R5383 Other fatigue: Secondary | ICD-10-CM

## 2019-06-21 DIAGNOSIS — E039 Hypothyroidism, unspecified: Secondary | ICD-10-CM

## 2019-06-21 DIAGNOSIS — R1013 Epigastric pain: Secondary | ICD-10-CM

## 2019-06-21 LAB — CBC WITH DIFFERENTIAL/PLATELET
Basophils Absolute: 0 10*3/uL (ref 0.0–0.1)
Basophils Relative: 0.7 % (ref 0.0–3.0)
Eosinophils Absolute: 0.1 10*3/uL (ref 0.0–0.7)
Eosinophils Relative: 2.4 % (ref 0.0–5.0)
HCT: 35.6 % — ABNORMAL LOW (ref 36.0–46.0)
Hemoglobin: 12.1 g/dL (ref 12.0–15.0)
Lymphocytes Relative: 36 % (ref 12.0–46.0)
Lymphs Abs: 1.8 10*3/uL (ref 0.7–4.0)
MCHC: 33.9 g/dL (ref 30.0–36.0)
MCV: 86.7 fl (ref 78.0–100.0)
Monocytes Absolute: 0.4 10*3/uL (ref 0.1–1.0)
Monocytes Relative: 8.3 % (ref 3.0–12.0)
Neutro Abs: 2.7 10*3/uL (ref 1.4–7.7)
Neutrophils Relative %: 52.6 % (ref 43.0–77.0)
Platelets: 245 10*3/uL (ref 150.0–400.0)
RBC: 4.1 Mil/uL (ref 3.87–5.11)
RDW: 12.8 % (ref 11.5–15.5)
WBC: 5 10*3/uL (ref 4.0–10.5)

## 2019-06-21 LAB — TSH: TSH: 1.28 u[IU]/mL (ref 0.35–4.50)

## 2019-06-21 MED ORDER — ATORVASTATIN CALCIUM 10 MG PO TABS: ORAL_TABLET | ORAL | 0 refills | Status: DC

## 2019-08-31 ENCOUNTER — Telehealth: Payer: Self-pay | Admitting: Family Medicine

## 2019-08-31 NOTE — Telephone Encounter (Signed)
Caller: Crystal Russell  Call Back # [phone]  Patient called in reference to appointment on 06/08,2021, patient states someone from this  office left message about insurance coverage and that this appointment is not a wellness visit. Pt states that she is entitled  to a wellness visit per Medicare.   Please Advise

## 2019-09-01 NOTE — Telephone Encounter (Signed)
SWP yesterday and called pt back this morning to schedule her AWV with the nurse this nurse.  LMOVM for pt to call me back so we can get that appt. Scheduled for her at her convenience.

## 2019-09-14 ENCOUNTER — Ambulatory Visit (INDEPENDENT_AMBULATORY_CARE_PROVIDER_SITE_OTHER): Payer: Medicare Other | Admitting: Family Medicine

## 2019-09-14 ENCOUNTER — Other Ambulatory Visit: Payer: Self-pay

## 2019-09-14 ENCOUNTER — Encounter: Payer: Self-pay | Admitting: Family Medicine

## 2019-09-14 VITALS — BP 123/70 | HR 47 | Temp 98.0°F | Resp 18 | Ht 63.0 in | Wt 132.0 lb

## 2019-09-14 DIAGNOSIS — Z1231 Encounter for screening mammogram for malignant neoplasm of breast: Secondary | ICD-10-CM | POA: Diagnosis not present

## 2019-09-14 DIAGNOSIS — E782 Mixed hyperlipidemia: Secondary | ICD-10-CM

## 2019-09-14 DIAGNOSIS — M8580 Other specified disorders of bone density and structure, unspecified site: Secondary | ICD-10-CM | POA: Diagnosis not present

## 2019-09-14 DIAGNOSIS — E2839 Other primary ovarian failure: Secondary | ICD-10-CM

## 2019-09-14 DIAGNOSIS — M25561 Pain in right knee: Secondary | ICD-10-CM

## 2019-09-14 DIAGNOSIS — K5900 Constipation, unspecified: Secondary | ICD-10-CM | POA: Diagnosis not present

## 2019-09-14 DIAGNOSIS — Z780 Asymptomatic menopausal state: Secondary | ICD-10-CM

## 2019-09-14 DIAGNOSIS — R42 Dizziness and giddiness: Secondary | ICD-10-CM | POA: Insufficient documentation

## 2019-09-14 DIAGNOSIS — Z124 Encounter for screening for malignant neoplasm of cervix: Secondary | ICD-10-CM | POA: Diagnosis not present

## 2019-09-14 DIAGNOSIS — E039 Hypothyroidism, unspecified: Secondary | ICD-10-CM | POA: Diagnosis not present

## 2019-09-14 LAB — LIPID PANEL
Cholesterol: 181 mg/dL (ref 0–200)
HDL: 62.1 mg/dL (ref >39.00)
LDL Cholesterol: 107 mg/dL — ABNORMAL HIGH (ref 0–99)
NonHDL: 118.99
Total CHOL/HDL Ratio: 3
Triglycerides: 61 mg/dL (ref 0.0–149.0)
VLDL: 12.2 mg/dL (ref 0.0–40.0)

## 2019-09-14 LAB — COMPREHENSIVE METABOLIC PANEL
ALT: 18 U/L (ref 0–35)
AST: 24 U/L (ref 0–37)
Albumin: 4.2 g/dL (ref 3.5–5.2)
Alkaline Phosphatase: 74 U/L (ref 39–117)
BUN: 14 mg/dL (ref 6–23)
CO2: 31 mEq/L (ref 19–32)
Calcium: 9.4 mg/dL (ref 8.4–10.5)
Chloride: 96 mEq/L (ref 96–112)
Creatinine, Ser: 0.62 mg/dL (ref 0.40–1.20)
GFR: 95.5 mL/min (ref >60.00)
Glucose, Bld: 86 mg/dL (ref 70–99)
Potassium: 4.1 mEq/L (ref 3.5–5.1)
Sodium: 131 mEq/L — ABNORMAL LOW (ref 135–145)
Total Bilirubin: 1 mg/dL (ref 0.2–1.2)
Total Protein: 6.4 g/dL (ref 6.0–8.3)

## 2019-09-14 LAB — CBC
HCT: 35.7 % — ABNORMAL LOW (ref 36.0–46.0)
Hemoglobin: 12.2 g/dL (ref 12.0–15.0)
MCHC: 34.1 g/dL (ref 30.0–36.0)
MCV: 86.2 fl (ref 78.0–100.0)
Platelets: 246 10*3/uL (ref 150.0–400.0)
RBC: 4.14 Mil/uL (ref 3.87–5.11)
RDW: 12.9 % (ref 11.5–15.5)
WBC: 4 10*3/uL (ref 4.0–10.5)

## 2019-09-14 LAB — TSH: TSH: 0.74 u[IU]/mL (ref 0.35–4.50)

## 2019-09-14 MED ORDER — POLYETHYLENE GLYCOL 3350 17 G PO PACK: PACK | ORAL | 0 refills | Status: DC

## 2019-09-14 NOTE — Assessment & Plan Note (Signed)
She has been managing with miralax but sometimes she has small amounts of incontinence. She is encouraged to alter his dosing to control Miralax 1/2 dose qod to a full dose twice a day and add Benefiber dose daily. Report if worsens.

## 2019-09-14 NOTE — Assessment & Plan Note (Signed)
On Levothyroxine, continue to monitor 

## 2019-09-14 NOTE — Assessment & Plan Note (Signed)
Tolerating statin, encouraged heart healthy diet, avoid trans fats, minimize simple carbs and saturated fats. Increase exercise as tolerated 

## 2019-09-14 NOTE — Patient Instructions (Signed)
Miralax can be adjusted from 1/2 dose every other day up to a full dose twice aday and use a full dose of Benefiber every day Preventive Care 69 Years and Older, Female Preventive care refers to lifestyle choices and visits with your health care provider that can promote health and wellness. This includes:  A yearly physical exam. This is also called an annual well check.  Regular dental and eye exams.  Immunizations.  Screening for certain conditions.  Healthy lifestyle choices, such as diet and exercise. What can I expect for my preventive care visit? Physical exam Your health care provider will check:  Height and weight. These may be used to calculate body mass index (BMI), which is a measurement that tells if you are at a healthy weight.  Heart rate and blood pressure.  Your skin for abnormal spots. Counseling Your health care provider may ask you questions about:  Alcohol, tobacco, and drug use.  Emotional well-being.  Home and relationship well-being.  Sexual activity.  Eating habits.  History of falls.  Memory and ability to understand (cognition).  Work and work Statistician.  Pregnancy and menstrual history. What immunizations do I need?  Influenza (flu) vaccine  This is recommended every year. Tetanus, diphtheria, and pertussis (Tdap) vaccine  You may need a Td booster every 10 years. Varicella (chickenpox) vaccine  You may need this vaccine if you have not already been vaccinated. Zoster (shingles) vaccine  You may need this after age 69. Pneumococcal conjugate (PCV13) vaccine  One dose is recommended after age 69. Pneumococcal polysaccharide (PPSV23) vaccine  One dose is recommended after age 69. Measles, mumps, and rubella (MMR) vaccine  You may need at least one dose of MMR if you were born in 1957 or later. You may also need a second dose. Meningococcal conjugate (MenACWY) vaccine  You may need this if you have certain  conditions. Hepatitis A vaccine  You may need this if you have certain conditions or if you travel or work in places where you may be exposed to hepatitis A. Hepatitis B vaccine  You may need this if you have certain conditions or if you travel or work in places where you may be exposed to hepatitis B. Haemophilus influenzae type b (Hib) vaccine  You may need this if you have certain conditions. You may receive vaccines as individual doses or as more than one vaccine together in one shot (combination vaccines). Talk with your health care provider about the risks and benefits of combination vaccines. What tests do I need? Blood tests  Lipid and cholesterol levels. These may be checked every 5 years, or more frequently depending on your overall health.  Hepatitis C test.  Hepatitis B test. Screening  Lung cancer screening. You may have this screening every year starting at age 23 if you have a 30-pack-year history of smoking and currently smoke or have quit within the past 15 years.  Colorectal cancer screening. All adults should have this screening starting at age 23 and continuing until age 42. Your health care provider may recommend screening at age 41 if you are at increased risk. You will have tests every 1-10 years, depending on your results and the type of screening test.  Diabetes screening. This is done by checking your blood sugar (glucose) after you have not eaten for a while (fasting). You may have this done every 1-3 years.  Mammogram. This may be done every 1-2 years. Talk with your health care provider about how often  you should have regular mammograms.  BRCA-related cancer screening. This may be done if you have a family history of breast, ovarian, tubal, or peritoneal cancers. Other tests  Sexually transmitted disease (STD) testing.  Bone density scan. This is done to screen for osteoporosis. You may have this done starting at age 69. Follow these instructions at  home: Eating and drinking  Eat a diet that includes fresh fruits and vegetables, whole grains, lean protein, and low-fat dairy products. Limit your intake of foods with high amounts of sugar, saturated fats, and salt.  Take vitamin and mineral supplements as recommended by your health care provider.  Do not drink alcohol if your health care provider tells you not to drink.  If you drink alcohol: ? Limit how much you have to 0-1 drink a day. ? Be aware of how much alcohol is in your drink. In the [place] one drink equals one 12 oz bottle of beer (355 mL), one 5 oz glass of wine (148 mL), or one 1 oz glass of hard liquor (44 mL). Lifestyle  Take daily care of your teeth and gums.  Stay active. Exercise for at least 30 minutes on 5 or more days each week.  Do not use any products that contain nicotine or tobacco, such as cigarettes, e-cigarettes, and chewing tobacco. If you need help quitting, ask your health care provider.  If you are sexually active, practice safe sex. Use a condom or other form of protection in order to prevent STIs (sexually transmitted infections).  Talk with your health care provider about taking a low-dose aspirin or statin. What's next?  Go to your health care provider once a year for a well check visit.  Ask your health care provider how often you should have your eyes and teeth checked.  Stay up to date on all vaccines. This information is not intended to replace advice given to you by your health care provider. Make sure you discuss any questions you have with your health care provider. Document Revised: 03/19/2018 Document Reviewed: 03/19/2018 Elsevier Patient Education  2020 [employer].

## 2019-09-14 NOTE — Assessment & Plan Note (Signed)
2 episodes of feeling dizzy (self spinning) for 45 to 1 hour over the past 2 month period of time recently. She has been worried to move too quickly since then. No nausea or vomiting. Encouraged 60-90 ounces of fluids daily. Eat proteins every 4-5 hours and report of persistent symptoms consider PT or meclizine prn

## 2019-09-16 ENCOUNTER — Encounter: Payer: Self-pay | Admitting: *Deleted

## 2019-09-19 ENCOUNTER — Emergency Department (HOSPITAL_COMMUNITY)
Admission: EM | Admit: 2019-09-19 | Discharge: 2019-09-19 | Disposition: A | Discharge status: Home / Self Care | Payer: Medicare Other | Attending: Emergency Medicine | Admitting: Emergency Medicine

## 2019-09-19 ENCOUNTER — Encounter: Payer: Self-pay | Admitting: Family Medicine

## 2019-09-19 ENCOUNTER — Encounter (HOSPITAL_COMMUNITY): Payer: Self-pay

## 2019-09-19 ENCOUNTER — Emergency Department (HOSPITAL_COMMUNITY): Payer: Medicare Other

## 2019-09-19 ENCOUNTER — Other Ambulatory Visit: Payer: Self-pay

## 2019-09-19 DIAGNOSIS — I480 Paroxysmal atrial fibrillation: Secondary | ICD-10-CM | POA: Insufficient documentation

## 2019-09-19 DIAGNOSIS — K5732 Diverticulitis of large intestine without perforation or abscess without bleeding: Secondary | ICD-10-CM | POA: Insufficient documentation

## 2019-09-19 DIAGNOSIS — Z86718 Personal history of other venous thrombosis and embolism: Secondary | ICD-10-CM | POA: Insufficient documentation

## 2019-09-19 DIAGNOSIS — K5792 Diverticulitis of intestine, part unspecified, without perforation or abscess without bleeding: Secondary | ICD-10-CM

## 2019-09-19 DIAGNOSIS — Z96651 Presence of right artificial knee joint: Secondary | ICD-10-CM | POA: Insufficient documentation

## 2019-09-19 DIAGNOSIS — E039 Hypothyroidism, unspecified: Secondary | ICD-10-CM | POA: Diagnosis not present

## 2019-09-19 DIAGNOSIS — Z87891 Personal history of nicotine dependence: Secondary | ICD-10-CM | POA: Insufficient documentation

## 2019-09-19 DIAGNOSIS — Z7901 Long term (current) use of anticoagulants: Secondary | ICD-10-CM | POA: Insufficient documentation

## 2019-09-19 DIAGNOSIS — Z79899 Other long term (current) drug therapy: Secondary | ICD-10-CM | POA: Insufficient documentation

## 2019-09-19 DIAGNOSIS — R102 Pelvic and perineal pain: Secondary | ICD-10-CM | POA: Diagnosis present

## 2019-09-19 DIAGNOSIS — Z7982 Long term (current) use of aspirin: Secondary | ICD-10-CM | POA: Diagnosis not present

## 2019-09-19 LAB — URINALYSIS, ROUTINE W REFLEX MICROSCOPIC
Bilirubin Urine: NEGATIVE
Glucose, UA: NEGATIVE mg/dL
Ketones, ur: NEGATIVE mg/dL
Nitrite: NEGATIVE
Protein, ur: NEGATIVE mg/dL
Specific Gravity, Urine: 1.009 (ref 1.005–1.030)
pH: 7 (ref 5.0–8.0)

## 2019-09-19 LAB — COMPREHENSIVE METABOLIC PANEL
ALT: 17 U/L (ref 0–44)
AST: 22 U/L (ref 15–41)
Albumin: 3.6 g/dL (ref 3.5–5.0)
Alkaline Phosphatase: 69 U/L (ref 38–126)
Anion gap: 10 (ref 5–15)
BUN: 6 mg/dL — ABNORMAL LOW (ref 8–23)
CO2: 26 mmol/L (ref 22–32)
Calcium: 9.4 mg/dL (ref 8.9–10.3)
Chloride: 99 mmol/L (ref 98–111)
Creatinine, Ser: 0.69 mg/dL (ref 0.44–1.00)
GFR calc Af Amer: >60 mL/min (ref >60)
GFR calc non Af Amer: >60 mL/min (ref >60)
Glucose, Bld: 109 mg/dL — ABNORMAL HIGH (ref 70–99)
Potassium: 4 mmol/L (ref 3.5–5.1)
Sodium: 135 mmol/L (ref 135–145)
Total Bilirubin: 1.8 mg/dL — ABNORMAL HIGH (ref 0.3–1.2)
Total Protein: 6.9 g/dL (ref 6.5–8.1)

## 2019-09-19 LAB — T4, FREE: Free T4: 1.18 ng/dL — ABNORMAL HIGH (ref 0.61–1.12)

## 2019-09-19 LAB — CBC
HCT: 39.6 % (ref 36.0–46.0)
Hemoglobin: 12.7 g/dL (ref 12.0–15.0)
MCH: 28.7 pg (ref 26.0–34.0)
MCHC: 32.1 g/dL (ref 30.0–36.0)
MCV: 89.6 fL (ref 80.0–100.0)
Platelets: 270 10*3/uL (ref 150–400)
RBC: 4.42 MIL/uL (ref 3.87–5.11)
RDW: 12.2 % (ref 11.5–15.5)
WBC: 9.4 10*3/uL (ref 4.0–10.5)
nRBC: 0 % (ref 0.0–0.2)

## 2019-09-19 LAB — TSH: TSH: 0.8 u[IU]/mL (ref 0.350–4.500)

## 2019-09-19 LAB — LIPASE, BLOOD: Lipase: 23 U/L (ref 11–51)

## 2019-09-19 MED ORDER — IOHEXOL 300 MG/ML  SOLN
100.0000 mL | Freq: Once | INTRAMUSCULAR | Status: AC | PRN
  Administered 2019-09-19: 100 mL via INTRAVENOUS

## 2019-09-19 MED ORDER — METRONIDAZOLE 500 MG PO TABS
500.0000 mg | ORAL_TABLET | Freq: Two times a day (BID) | ORAL | 0 refills | Status: AC
Start: 2019-09-19 — End: 2019-09-26

## 2019-09-19 MED ORDER — SODIUM CHLORIDE 0.9% FLUSH: 3.0000 mL | Freq: Once | INTRAVENOUS | Status: DC

## 2019-09-19 MED ORDER — SULFAMETHOXAZOLE-TRIMETHOPRIM 800-160 MG PO TABS
1.0000 | ORAL_TABLET | Freq: Two times a day (BID) | ORAL | 0 refills | Status: DC
Start: 2019-09-19 — End: 2019-09-24

## 2019-09-19 NOTE — Discharge Instructions (Addendum)
You presented to the ED with 2 and half days of abdominal pain.  Your CT scan shows uncomplicated diverticulitis.  You are prescribed 2 types of antibiotics.  Take them as prescribed for 7 days.  Follow-up with your PCP.  Pain should improve over the next several days.  If you have worsening symptoms, nausea, vomiting inability to tolerate food or drink please return to the ED for further evaluation.

## 2019-09-19 NOTE — ED Triage Notes (Signed)
Patient complains of 3 days of abdominal cramping with lower rectal pain as well. States that she feels fatigued. No nausea, no vomiting. Reports bowel movement yesterday. Denies fever.

## 2019-09-19 NOTE — ED Provider Notes (Signed)
[facility] EMERGENCY DEPARTMENT Provider Note   CSN: [id number] Arrival date & time: 09/19/19  1428     History Chief Complaint  Patient presents with  . Abdominal Pain    Crystal Russell is a 69 y.o. female.  Patient presenting to the ED with 2-1/2 days of significant worsening abdominal pain and pelvic pain.  Patient states pain started with mild pain approximately 2 and half days ago and is now increased to 8 out of 10 and prevents her from doing anything, including walking.  Patient denies vaginal bleeding, nausea, vomiting, diarrhea, constipation, dysuria.  Patient endorses cramping pain has had cystitis in the past.  Patient's pain is migratory and patient has been unable to identify location with palpation herself.  Patient took Tylenol earlier with minimal improvement.  Patient does not want any pain medication at this time.  The history is provided by the patient, medical records and the spouse.  Illness Location:  Abdomen Quality:  Pain Severity:  Moderate Onset quality:  Gradual Timing:  Constant Progression:  Worsening Chronicity:  New Context:  No specific etiology Relieved by:  Nothing Worsened by:  Nothing Ineffective treatments:  Tylenol Associated symptoms: abdominal pain and fatigue   Associated symptoms: no chest pain, no congestion, no cough, no diarrhea, no fever, no headaches, no loss of consciousness, no nausea, no shortness of breath and no vomiting        Past Medical History:  Diagnosis Date  . Abdominal wall hernia 08/04/2012  . Atrial fibrillation ([place]) 2008   REPORTS SHE WAS AT [place], UPON WAKING I FELT MY HEART GOING BOOM BOOM BOOM BEATING OUT OF MY CHEST. I WENT TO HOSPITAL AND THEY ISED MEDICINE TO CONVERT ME , IT TOOK OVER 10 HOURS TO CONVERT ME. BUT AFTER THAT IVE NEVER HAD ANY ISSUES SINCE. I USED TO HAVE A CARDIOLOGUIST AS MY PRIMARY IN [place] BUT IHAVENT BEEN TO A CARDIOLOGIST SINCE I MOVED DOWN HERE.   Marland Kitchen Chicken pox as a  child  . Depression   . Depression with anxiety 12/27/2012  . DJD (degenerative joint disease) 08/05/2012  . DVT (deep venous thrombosis) ([place]) 2018  . Fifth disease 1989   DX AFTER EXPOSURE FROM HER CHILDREN; DEVELOPED RHEUMATOID ARTHRITIS DURING COURSE OF ILLNESS. REPORTS:  " I DONT HAVE IT ANYMORE SINCE THE DISEASE IS GONE"   . H/O gestational diabetes mellitus, not currently pregnant 08/05/2012   History of   . Heart murmur    echo 2012; "I HAVE A FUNCTIONAL MURMUR"   . Hyperlipidemia   . Hypothyroidism   . Knee effusion, right    while on Eliquis, discontinued Eliquis after DVT resolved  . Measles as a child  . Other and unspecified hyperlipidemia 08/05/2012  . Paroxysmal A-fib ([place])   . Shingles 43  . Spondylisthesis 2012   L5/S1 grade 3  . Varicose veins 08/05/2012   Extending up to right groin B/l LE  . Ventral hernia 08/05/2012  . Wears glasses     Patient Active Problem List   Diagnosis Date Noted  . Vertigo 09/14/2019  . Overweight (BMI 25.0-29.9) 07/02/2017  . Status post total right knee replacement 07/02/2017  . S/P right TKA 07/01/2017  . Right knee pain 06/16/2017  . Postmenopausal bleeding 12/06/2015  . Varicose veins of bilateral lower extremities with other complications 56/38/9373  . Sun-damaged skin 12/04/2014  . Breast mass, left 05/16/2014  . Varicose veins of lower extremities with other complications 42/87/6811  . Preventative  health care 08/08/2013  . Hypothyroidism 08/08/2013  . Atrophic vaginitis 08/08/2013  . Cervical cancer screening 08/05/2013  . ETD (eustachian tube dysfunction) 05/26/2013  . Depression with anxiety 12/27/2012  . Ventral hernia 08/05/2012  . Varicose veins 08/05/2012  . DJD (degenerative joint disease) 08/05/2012  . Constipation 08/05/2012  . Hyperlipidemia, mixed 08/05/2012  . H/O gestational diabetes mellitus, not currently pregnant 08/05/2012  . Chicken pox   . Shingles   . Paroxysmal A-fib ([place])   . Heart murmur   .  Spondylisthesis     Past Surgical History:  Procedure Laterality Date  . BREAST DUCTAL SYSTEM EXCISION Right 05/05/2014   Procedure: RIGHT BREAST DUCT EXCISION;  Surgeon: Rolm Bookbinder, MD;  Location: [facility];  Service: General;  Laterality: Right;  . COLONOSCOPY    . DILATION AND CURETTAGE OF UTERUS     x4  . TOTAL KNEE ARTHROPLASTY Right 07/01/2017   Procedure: RIGHT TOTAL KNEE ARTHROPLASTY;  Surgeon: Paralee Cancel, MD;  Location: [facility];  Service: Orthopedics;  Laterality: Right;  70 mins  . [place]   right leg     OB History    Gravida  3   Para  3   Term      Preterm      AB      Living  3     SAB      TAB      Ectopic      Multiple      Live Births              Family History  Problem Relation Age of Onset  . Cancer Mother        lung- smoker  . Liver disease Mother   . Cancer Father        lung- smoker  . Peripheral Artery Disease Sister   . Dementia Maternal Grandmother   . Heart attack Maternal Grandfather   . Asthma Son   . Ulcerative colitis Son   . Asthma Son   . Ulcerative colitis Son   . Heart attack Paternal Uncle     Social History   Tobacco Use  . Smoking status: Former Smoker    Years: 4.00    Types: Cigarettes  . Smokeless tobacco: Never Used  Vaping Use  . Vaping Use: Never used  Substance Use Topics  . Alcohol use: No  . Drug use: No    Home Medications Prior to Admission medications   Medication Sig Start Date End Date Taking? Authorizing Provider  aspirin EC 81 MG tablet Take 81 mg by mouth daily. Swallow whole.   Yes [provider]  atorvastatin (LIPITOR) 10 MG tablet Take 1/2 (one-half) tablet by mouth once daily Patient taking differently: Take 5 mg by mouth at bedtime.  06/21/19  Yes Mosie Lukes, MD  Co-Enzyme Q-10 100 MG CAPS Take 300 mg by mouth daily.   Yes [provider]  levothyroxine (SYNTHROID) 100 MCG tablet Take 1 tablet (100 mcg total)  by mouth daily. 03/23/19  Yes Mosie Lukes, MD  polyethylene glycol (MIRALAX / GLYCOLAX) 17 g packet 1/2 dose every day up to 1 dose bid as needed Patient taking differently: Take 8.5-17 g by mouth daily as needed for mild constipation.  09/14/19  Yes Mosie Lukes, MD  Wheat Dextrin (BENEFIBER) POWD Take by mouth See admin instructions. Mix 1-2 teaspoonsful of powder into 4-12 ounces of water and drink once a  day as needed to increase dietary fiber   Yes [provider]  metroNIDAZOLE (FLAGYL) 500 MG tablet Take 1 tablet (500 mg total) by mouth 2 (two) times daily for 7 days. 09/19/19 09/26/19  Delma Post, MD  sulfamethoxazole-trimethoprim (BACTRIM DS) 800-160 MG tablet Take 1 tablet by mouth 2 (two) times daily for 7 days. 09/19/19 09/26/19  Delma Post, MD    Allergies    Crestor [rosuvastatin], Pravastatin, and Penicillins  Review of Systems   Review of Systems  Constitutional: Positive for activity change, appetite change and fatigue. Negative for fever.  HENT: Negative for congestion.   Respiratory: Negative for cough and shortness of breath.   Cardiovascular: Negative for chest pain.  Gastrointestinal: Positive for abdominal pain. Negative for diarrhea, nausea and vomiting.  Neurological: Negative for loss of consciousness and headaches.  All other systems reviewed and are negative.   Physical Exam Updated Vital Signs BP 127/68   Pulse 62   Temp 100 F (37.8 C) (Oral)   Resp 18   Ht 5\' 4"  (1.626 m)   Wt 59.4 kg   SpO2 100%   BMI 22.49 kg/m   Physical Exam Vitals and nursing note reviewed.  Constitutional:      General: She is not in acute distress.    Appearance: She is well-developed.  HENT:     Head: Normocephalic and atraumatic.     Right Ear: External ear normal.     Left Ear: External ear normal.     Nose: Nose normal.     Mouth/Throat:     Mouth: Mucous membranes are moist.  Eyes:     Conjunctiva/sclera: Conjunctivae normal.  Cardiovascular:       Rate and Rhythm: Normal rate and regular rhythm.     Heart sounds: No murmur heard.   Pulmonary:     Effort: Pulmonary effort is normal. No respiratory distress.     Breath sounds: Normal breath sounds.  Abdominal:     Palpations: Abdomen is soft.     Tenderness: There is abdominal tenderness.  Musculoskeletal:        General: Normal range of motion.     Cervical back: Neck supple.  Skin:    General: Skin is warm and dry.  Neurological:     General: No focal deficit present.     Mental Status: She is alert and oriented to person, place, and time.  Psychiatric:        Mood and Affect: Mood normal.        Behavior: Behavior normal.     ED Results / Procedures / Treatments   Labs (all labs ordered are listed, but only abnormal results are displayed) Labs Reviewed  COMPREHENSIVE METABOLIC PANEL - Abnormal; Notable for the following components:      Result Value   Glucose, Bld 109 (*)    BUN 6 (*)    Total Bilirubin 1.8 (*)    All other components within normal limits  URINALYSIS, ROUTINE W REFLEX MICROSCOPIC - Abnormal; Notable for the following components:   Hgb urine dipstick SMALL (*)    Leukocytes,Ua LARGE (*)    Bacteria, UA RARE (*)    All other components within normal limits  T4, FREE - Abnormal; Notable for the following components:   Free T4 1.18 (*)    All other components within normal limits  LIPASE, BLOOD  CBC  TSH    EKG None  Radiology CT ABDOMEN PELVIS W CONTRAST  Result Date: 09/19/2019 CLINICAL DATA:  Several days of diffuse abdominal pain, initial encounter EXAM: CT ABDOMEN AND PELVIS WITH CONTRAST TECHNIQUE: Multidetector CT imaging of the abdomen and pelvis was performed using the standard protocol following bolus administration of intravenous contrast. CONTRAST:  165mL OMNIPAQUE IOHEXOL 300 MG/ML  SOLN COMPARISON:  None. FINDINGS: Lower chest: No acute abnormality. Hepatobiliary: No focal liver abnormality is seen. No gallstones, gallbladder  wall thickening, or biliary dilatation. Pancreas: Unremarkable. No pancreatic ductal dilatation or surrounding inflammatory changes. Spleen: Normal in size without focal abnormality. Adrenals/Urinary Tract: Adrenal glands are within normal limits. Kidneys are well visualized bilaterally. No renal calculi or obstructive changes are seen. Bladder is well distended. No obstructive changes are noted. Stomach/Bowel: Diverticular change of the colon is noted. Some diffuse inflammatory changes are noted surrounding the rectosigmoid consistent with diverticulitis. No abscess or perforation is noted at this time. More proximal colon appears within normal limits. The appendix is within normal limits. No small bowel obstructive changes are seen. Stomach is decompressed. Vascular/Lymphatic: No significant vascular findings are present. No enlarged abdominal or pelvic lymph nodes. Left retroaortic renal vein is seen. Reproductive: Uterus and bilateral adnexa are unremarkable. Mild pelvic varices are noted on the left. Other: No abdominal wall hernia or abnormality. No abdominopelvic ascites. Musculoskeletal: No acute or significant osseous findings. Degenerative anterolisthesis of L5 on S1 is noted. IMPRESSION: Changes consistent with diverticulitis in the distal sigmoid/rectosigmoid region. No abscess or perforation is noted. Chronic changes as described above. Electronically Signed   By: Inez Catalina M.D.   On: 09/19/2019 17:54    Procedures Procedures (including critical care time)  Medications Ordered in ED Medications  iohexol (OMNIPAQUE) 300 MG/ML solution 100 mL (100 mLs Intravenous Contrast Given 09/19/19 1741)    ED Course  I have reviewed the triage vital signs and the nursing notes.  Pertinent labs & imaging results that were available during my care of the patient were reviewed by me and considered in my medical decision making (see chart for details).    MDM Rules/Calculators/A&P                           Differential diagnosis: Appendicitis, does not abdominal pathology  ED physician interpretation of imaging: CT shows diverticulitis with complication. ED physician interpretation of labs: Patient has doubling of her white blood cell count since last lab work but still within normal limits.  Other lab work including lipase, TSH and T4 unremarkable.  MDM: Patient is a healthy 69 year old female presenting with 2 and half days of worsening abdominal pain, decreased appetite found to have diverticulitis on CT scan appropriate for outpatient antibiotics and treatment.  Patient's vital signs are stable, patient afebrile.  Patient's physical exam with no specific area of abdominal tenderness, patient has migratory pain.  Patient declined additional pain medications.  As CT shows no other significant pathology diverticulitis with most likely diagnosis.  Diagnosis, treatment and plan of care was discussed and agreed upon with patient.  Patient comfortable with discharge at this time.  Key discharge instructions: You presented to the ED with 2 and half days of abdominal pain.  Your CT scan shows uncomplicated diverticulitis.  You are prescribed 2 types of antibiotics.  Take them as prescribed for 7 days.  Follow-up with your PCP.  Pain should improve over the next several days.  If you have worsening symptoms, nausea, vomiting inability to tolerate food or drink please return to the ED for further evaluation.   Final Clinical  Impression(s) / ED Diagnoses Final diagnoses:  Diverticulitis    Rx / DC Orders ED Discharge Orders         Ordered    sulfamethoxazole-trimethoprim (BACTRIM DS) 800-160 MG tablet  2 times daily     Discontinue  Reprint     09/19/19 1834    metroNIDAZOLE (FLAGYL) 500 MG tablet  2 times daily     Discontinue  Reprint     09/19/19 1834           Delma Post, MD 09/20/19 Noberto Retort, MD 09/20/19 2203

## 2019-09-19 NOTE — ED Notes (Signed)
The pt feels like she has the chills  t 99.6

## 2019-09-20 ENCOUNTER — Ambulatory Visit (HOSPITAL_BASED_OUTPATIENT_CLINIC_OR_DEPARTMENT_OTHER)
Admission: RE | Admit: 2019-09-20 | Discharge: 2019-09-20 | Disposition: A | Discharge status: Home / Self Care | Payer: Medicare Other | Source: Ambulatory Visit | Attending: Family Medicine | Admitting: Family Medicine

## 2019-09-20 DIAGNOSIS — E2839 Other primary ovarian failure: Secondary | ICD-10-CM | POA: Diagnosis present

## 2019-09-20 DIAGNOSIS — M8580 Other specified disorders of bone density and structure, unspecified site: Secondary | ICD-10-CM

## 2019-09-20 DIAGNOSIS — M8589 Other specified disorders of bone density and structure, multiple sites: Secondary | ICD-10-CM | POA: Diagnosis not present

## 2019-09-20 DIAGNOSIS — Z780 Asymptomatic menopausal state: Secondary | ICD-10-CM

## 2019-09-20 LAB — HM PAP SMEAR: HM Pap smear: NEGATIVE

## 2019-09-20 NOTE — Progress Notes (Signed)
Subjective:    Patient ID: Crystal Russell, female    DOB: 05-05-1950, 69 y.o.   MRN: [id number]  Chief Complaint  Patient presents with  . Follow-up    yearly medicare    HPI Patient is in today for follow up on chronic medical concerns. No recent febrile illness or hospitalizations. She is interested in getting a Mammogram ordered. Labs helped some. Denies CP/palp/SOB/HA/congestion/fevers/GI or GU c/o. Taking meds as prescribed  Past Medical History:  Diagnosis Date  . Abdominal wall hernia 08/04/2012  . Atrial fibrillation ([place]) 2008   REPORTS SHE WAS AT [place], UPON WAKING I FELT MY HEART GOING BOOM BOOM BOOM BEATING OUT OF MY CHEST. I WENT TO HOSPITAL AND THEY ISED MEDICINE TO CONVERT ME , IT TOOK OVER 10 HOURS TO CONVERT ME. BUT AFTER THAT IVE NEVER HAD ANY ISSUES SINCE. I USED TO HAVE A CARDIOLOGUIST AS MY PRIMARY IN [place] BUT IHAVENT BEEN TO A CARDIOLOGIST SINCE I MOVED DOWN HERE.   Marland Kitchen Chicken pox as a child  . Depression   . Depression with anxiety 12/27/2012  . DJD (degenerative joint disease) 08/05/2012  . DVT (deep venous thrombosis) ([place]) 2018  . Fifth disease 1989   DX AFTER EXPOSURE FROM HER CHILDREN; DEVELOPED RHEUMATOID ARTHRITIS DURING COURSE OF ILLNESS. REPORTS:  " I DONT HAVE IT ANYMORE SINCE THE DISEASE IS GONE"   . H/O gestational diabetes mellitus, not currently pregnant 08/05/2012   History of   . Heart murmur    echo 2012; "I HAVE A FUNCTIONAL MURMUR"   . Hyperlipidemia   . Hypothyroidism   . Knee effusion, right    while on Eliquis, discontinued Eliquis after DVT resolved  . Measles as a child  . Other and unspecified hyperlipidemia 08/05/2012  . Paroxysmal A-fib ([place])   . Shingles 43  . Spondylisthesis 2012   L5/S1 grade 3  . Varicose veins 08/05/2012   Extending up to right groin B/l LE  . Ventral hernia 08/05/2012  . Wears glasses     Past Surgical History:  Procedure Laterality Date  . BREAST DUCTAL SYSTEM EXCISION Right 05/05/2014   Procedure:  RIGHT BREAST DUCT EXCISION;  Surgeon: Rolm Bookbinder, MD;  Location: [facility];  Service: General;  Laterality: Right;  . COLONOSCOPY    . DILATION AND CURETTAGE OF UTERUS     x4  . TOTAL KNEE ARTHROPLASTY Right 07/01/2017   Procedure: RIGHT TOTAL KNEE ARTHROPLASTY;  Surgeon: Paralee Cancel, MD;  Location: [facility];  Service: Orthopedics;  Laterality: Right;  70 mins  . [place]   right leg    Family History  Problem Relation Age of Onset  . Cancer Mother        lung- smoker  . Liver disease Mother   . Cancer Father        lung- smoker  . Peripheral Artery Disease Sister   . Dementia Maternal Grandmother   . Heart attack Maternal Grandfather   . Asthma Son   . Ulcerative colitis Son   . Asthma Son   . Ulcerative colitis Son   . Heart attack Paternal Uncle     Social History   Socioeconomic History  . Marital status: Married    Spouse name: Not on file  . Number of children: Not on file  . Years of education: Not on file  . Highest education level: Not on file  Occupational History  . Not on file  Tobacco Use  .  Smoking status: Former Smoker    Years: 4.00    Types: Cigarettes  . Smokeless tobacco: Never Used  Vaping Use  . Vaping Use: Never used  Substance and Sexual Activity  . Alcohol use: No  . Drug use: No  . Sexual activity: Yes  Other Topics Concern  . Not on file  Social History Narrative  . Not on file   Social Determinants of Health   Financial Resource Strain:   . Difficulty of Paying Living Expenses:   Food Insecurity:   . Worried About Charity fundraiser in the Last Year:   . Arboriculturist in the Last Year:   Transportation Needs:   . Film/video editor (Medical):   Marland Kitchen Lack of Transportation (Non-Medical):   Physical Activity:   . Days of Exercise per Week:   . Minutes of Exercise per Session:   Stress:   . Feeling of Stress :   Social Connections:   . Frequency of Communication with Friends and  Family:   . Frequency of Social Gatherings with Friends and Family:   . Attends Religious Services:   . Active Member of Clubs or Organizations:   . Attends Archivist Meetings:   Marland Kitchen Marital Status:   Intimate Partner Violence:   . Fear of Current or Ex-Partner:   . Emotionally Abused:   Marland Kitchen Physically Abused:   . Sexually Abused:     Outpatient Medications Prior to Visit  Medication Sig Dispense Refill  . atorvastatin (LIPITOR) 10 MG tablet Take 1/2 (one-half) tablet by mouth once daily (Patient taking differently: Take 5 mg by mouth at bedtime. ) 45 tablet 0  . Co-Enzyme Q-10 100 MG CAPS Take 300 mg by mouth daily.    Marland Kitchen levothyroxine (SYNTHROID) 100 MCG tablet Take 1 tablet (100 mcg total) by mouth daily. 90 tablet 3  . polyethylene glycol (MIRALAX / GLYCOLAX) packet Take 17 g by mouth 2 (two) times daily. 14 each 0   No facility-administered medications prior to visit.    Allergies  Allergen Reactions  . Crestor [Rosuvastatin] Other (See Comments)    Joint pain w/fibromyalgia  . Pravastatin Other (See Comments)    Joint pain w/fibromyalgia  . Penicillins Swelling, Rash and Other (See Comments)    Hands and feet swell, rash; childhood allergy  Has patient had a PCN reaction causing immediate rash, facial/tongue/throat swelling, SOB or lightheadedness with hypotension: Unknown Has patient had a PCN reaction causing severe rash involving mucus membranes or skin necrosis: Unknown Has patient had a PCN reaction that required hospitalization: Yes Has patient had a PCN reaction occurring within the last 10 years: No If all of the above answers are "NO", then may proceed with Cephalosporin use.     Review of Systems  Constitutional: Negative for fever and malaise/fatigue.  HENT: Negative for congestion.   Eyes: Negative for blurred vision.  Respiratory: Negative for shortness of breath.   Cardiovascular: Negative for chest pain, palpitations and leg swelling.    Gastrointestinal: Positive for constipation. Negative for abdominal pain, blood in stool and nausea.  Genitourinary: Negative for dysuria and frequency.  Musculoskeletal: Negative for falls.  Skin: Negative for rash.  Neurological: Negative for dizziness, loss of consciousness and headaches.  Endo/Heme/Allergies: Negative for environmental allergies.  Psychiatric/Behavioral: Negative for depression. The patient is not nervous/anxious.        Objective:    Physical Exam Vitals and nursing note reviewed.  Constitutional:      General:  She is not in acute distress.    Appearance: She is well-developed.  HENT:     Head: Normocephalic and atraumatic.     Nose: Nose normal.  Eyes:     General:        Right eye: No discharge.        Left eye: No discharge.  Cardiovascular:     Rate and Rhythm: Normal rate and regular rhythm.     Heart sounds: No murmur heard.   Pulmonary:     Effort: Pulmonary effort is normal.     Breath sounds: Normal breath sounds.  Abdominal:     General: Bowel sounds are normal.     Palpations: Abdomen is soft.     Tenderness: There is no abdominal tenderness.  Musculoskeletal:     Cervical back: Normal range of motion and neck supple.  Skin:    General: Skin is warm and dry.  Neurological:     Mental Status: She is alert and oriented to person, place, and time.     BP 123/70 (BP Location: Right Arm, Patient Position: Sitting, Cuff Size: Small)   Pulse (!) 47   Temp 98 F (36.7 C) (Temporal)   Resp 18   Ht 5\' 3"  (1.6 m)   Wt 132 lb (59.9 kg) Comment: Per Pt  SpO2 100%   BMI 23.38 kg/m  Wt Readings from Last 3 Encounters:  09/19/19 131 lb (59.4 kg)  09/14/19 132 lb (59.9 kg)  07/01/17 151 lb (68.5 kg)    Diabetic Foot Exam - Simple   No data filed     Lab Results  Component Value Date   WBC 9.4 09/19/2019   HGB 12.7 09/19/2019   HCT 39.6 09/19/2019   PLT 270 09/19/2019   GLUCOSE 109 (H) 09/19/2019   CHOL 181 09/14/2019   TRIG  61.0 09/14/2019   HDL 62.10 09/14/2019   LDLCALC 107 (H) 09/14/2019   ALT 17 09/19/2019   AST 22 09/19/2019   NA 135 09/19/2019   K 4.0 09/19/2019   CL 99 09/19/2019   CREATININE 0.69 09/19/2019   BUN 6 (L) 09/19/2019   CO2 26 09/19/2019   TSH 0.800 09/19/2019   HGBA1C 5.5 10/26/2014    Lab Results  Component Value Date   TSH 0.800 09/19/2019   Lab Results  Component Value Date   WBC 9.4 09/19/2019   HGB 12.7 09/19/2019   HCT 39.6 09/19/2019   MCV 89.6 09/19/2019   PLT 270 09/19/2019   Lab Results  Component Value Date   NA 135 09/19/2019   K 4.0 09/19/2019   CO2 26 09/19/2019   GLUCOSE 109 (H) 09/19/2019   BUN 6 (L) 09/19/2019   CREATININE 0.69 09/19/2019   BILITOT 1.8 (H) 09/19/2019   ALKPHOS 69 09/19/2019   AST 22 09/19/2019   ALT 17 09/19/2019   PROT 6.9 09/19/2019   ALBUMIN 3.6 09/19/2019   CALCIUM 9.4 09/19/2019   ANIONGAP 10 09/19/2019   GFR 95.50 09/14/2019   Lab Results  Component Value Date   CHOL 181 09/14/2019   Lab Results  Component Value Date   HDL 62.10 09/14/2019   Lab Results  Component Value Date   LDLCALC 107 (H) 09/14/2019   Lab Results  Component Value Date   TRIG 61.0 09/14/2019   Lab Results  Component Value Date   CHOLHDL 3 09/14/2019   Lab Results  Component Value Date   HGBA1C 5.5 10/26/2014       Assessment &  Plan:   Problem List Items Addressed This Visit    Constipation    She has been managing with miralax but sometimes she has small amounts of incontinence. She is encouraged to alter his dosing to control Miralax 1/2 dose qod to a full dose twice a day and add Benefiber dose daily. Report if worsens.       Hyperlipidemia, mixed - Primary    Tolerating statin, encouraged heart healthy diet, avoid trans fats, minimize simple carbs and saturated fats. Increase exercise as tolerated      Relevant Orders   Comprehensive metabolic panel (Completed)   Lipid panel (Completed)   Cervical cancer screening    Relevant Orders   Ambulatory referral to Gynecology   Hypothyroidism    On Levothyroxine, continue to monitor      Relevant Orders   TSH (Completed)   Right knee pain   Relevant Orders   CBC (Completed)   Vertigo    2 episodes of feeling dizzy (self spinning) for 45 to 1 hour over the past 2 month period of time recently. She has been worried to move too quickly since then. No nausea or vomiting. Encouraged 60-90 ounces of fluids daily. Eat proteins every 4-5 hours and report of persistent symptoms consider PT or meclizine prn      Relevant Orders   CBC (Completed)    Other Visit Diagnoses    Osteopenia, unspecified location       Relevant Orders   DG Bone Density   Post-menopausal       Relevant Orders   DG Bone Density   Estrogen deficiency       Relevant Orders   DG Bone Density   Encounter for screening mammogram for malignant neoplasm of breast          I have changed Ivin Booty Shimmin's polyethylene glycol. I am also having her maintain her Co-Enzyme Q-10, levothyroxine, and atorvastatin.  Meds ordered this encounter  Medications  . polyethylene glycol (MIRALAX / GLYCOLAX) 17 g packet    Sig: 1/2 dose every day up to 1 dose bid as needed    Dispense:  14 each    Refill:  0     Penni Homans, MD

## 2019-09-21 ENCOUNTER — Ambulatory Visit: Payer: Medicare Other | Admitting: Family Medicine

## 2019-09-21 ENCOUNTER — Telehealth (INDEPENDENT_AMBULATORY_CARE_PROVIDER_SITE_OTHER): Payer: Medicare Other | Admitting: Family Medicine

## 2019-09-21 ENCOUNTER — Encounter: Payer: Self-pay | Admitting: Family Medicine

## 2019-09-21 ENCOUNTER — Telehealth: Payer: Self-pay | Admitting: Family Medicine

## 2019-09-21 VITALS — HR 60 | Temp 100.3°F

## 2019-09-21 DIAGNOSIS — K5792 Diverticulitis of intestine, part unspecified, without perforation or abscess without bleeding: Secondary | ICD-10-CM

## 2019-09-21 MED ORDER — DICYCLOMINE HCL 10 MG PO CAPS: ORAL_CAPSULE | ORAL | 0 refills | Status: DC

## 2019-09-21 MED ORDER — ONDANSETRON 4 MG PO TBDP: 4.0000 mg | ORAL_TABLET | Freq: Three times a day (TID) | ORAL | 0 refills | Status: DC | PRN

## 2019-09-21 NOTE — Telephone Encounter (Signed)
Crystal Russell

## 2019-09-21 NOTE — Telephone Encounter (Signed)
Patient called again today asking why she was never called for an appt. I apologized and offered to send to get her an appt with Dr. Nani Ravens today. Patient called in stating she was till not feeling better and was running a low grade fever. She had questions about if she should cont to take tylenol for her fever. I told her she can discuss all meds with provider on her virtual visit

## 2019-09-21 NOTE — Progress Notes (Signed)
Chief Complaint  Patient presents with   Diverticulitis    diagnosed on sunday at the ED   Fever    increased yesterday    Subjective: Patient is a 69 y.o. female here for f/u diverticulitis. Due to COVID-19 pandemic, we are interacting via web portal for an electronic face-to-face visit. I verified patient's ID using 2 identifiers. Patient agreed to proceed with visit via this method. Patient is at home, I am at office. Patient and I are present for visit.   The patient was seen 2 days ago in the emergency department for worsening lower abdominal and rectal pain.  She was diagnosed with diverticulitis after a CT scan of the abdomen.  She was placed on Flagyl and Bactrim due to allergies to penicillins.  She has been compliant with this medication since then.  She is having associated stomach cramping and nausea.  She developed a fever of 101 F last night.  Since starting treatment, her pain has gotten better.  Past Medical History:  Diagnosis Date   Abdominal wall hernia 08/04/2012   Atrial fibrillation ([place]) 2008   REPORTS SHE WAS AT [facility], UPON WAKING I FELT MY HEART GOING BOOM BOOM BOOM BEATING OUT OF MY CHEST. I WENT TO HOSPITAL AND THEY ISED MEDICINE TO CONVERT ME , IT TOOK OVER 10 HOURS TO CONVERT ME. BUT AFTER THAT IVE NEVER HAD ANY ISSUES SINCE. I USED TO HAVE A CARDIOLOGUIST AS MY PRIMARY IN [place] BUT IHAVENT BEEN TO A CARDIOLOGIST SINCE I MOVED DOWN HERE.    Chicken pox as a child   Depression    Depression with anxiety 12/27/2012   DJD (degenerative joint disease) 08/05/2012   DVT (deep venous thrombosis) ([place]) 2018   Fifth disease 1989   DX AFTER EXPOSURE FROM HER CHILDREN; DEVELOPED RHEUMATOID ARTHRITIS DURING COURSE OF ILLNESS. REPORTS:  " I DONT HAVE IT ANYMORE SINCE THE DISEASE IS GONE"    H/O gestational diabetes mellitus, not currently pregnant 08/05/2012   History of    Heart murmur    echo 2012; "I HAVE A FUNCTIONAL MURMUR"    Hyperlipidemia     Hypothyroidism    Knee effusion, right    while on Eliquis, discontinued Eliquis after DVT resolved   Measles as a child   Other and unspecified hyperlipidemia 08/05/2012   Paroxysmal A-fib ([place])    Shingles 43   Spondylisthesis 2012   L5/S1 grade 3   Varicose veins 08/05/2012   Extending up to right groin B/l LE   Ventral hernia 08/05/2012   Wears glasses     Objective: Pulse 60    Temp 100.3 F (37.9 C) (Oral)  No conversational dyspnea Age appropriate judgment and insight Nml affect and mood  Assessment and Plan: Diverticulitis - Plan: ondansetron (ZOFRAN-ODT) 4 MG disintegrating tablet, dicyclomine (BENTYL) 10 MG capsule  Add above medications for comfort.  I would wait 1 more day and if she is still having fevers would reimage her to rule out any abscess.  Doubt perforation.  She will send me a message overnight so I will receive her tomorrow morning to order the CT scan.  If signs or symptoms worsen overnight, she will have to go to the emergency department. The patient voiced understanding and agreement to the plan.  [place], DO 09/21/19  12:49 PM

## 2019-09-22 ENCOUNTER — Other Ambulatory Visit: Payer: Self-pay | Admitting: Family Medicine

## 2019-09-22 DIAGNOSIS — R509 Fever, unspecified: Secondary | ICD-10-CM

## 2019-09-22 DIAGNOSIS — K5792 Diverticulitis of intestine, part unspecified, without perforation or abscess without bleeding: Secondary | ICD-10-CM

## 2019-09-22 NOTE — Telephone Encounter (Signed)
Patient seen by Dr. Nani Ravens yesterday.

## 2019-09-23 ENCOUNTER — Encounter: Payer: Self-pay | Admitting: Family Medicine

## 2019-09-24 ENCOUNTER — Ambulatory Visit (HOSPITAL_BASED_OUTPATIENT_CLINIC_OR_DEPARTMENT_OTHER)
Admission: RE | Admit: 2019-09-24 | Discharge: 2019-09-24 | Disposition: A | Discharge status: Home / Self Care | Payer: Medicare Other | Source: Ambulatory Visit | Attending: Family Medicine | Admitting: Family Medicine

## 2019-09-24 ENCOUNTER — Other Ambulatory Visit: Payer: Self-pay | Admitting: Family Medicine

## 2019-09-24 ENCOUNTER — Other Ambulatory Visit: Payer: Self-pay

## 2019-09-24 ENCOUNTER — Encounter (HOSPITAL_BASED_OUTPATIENT_CLINIC_OR_DEPARTMENT_OTHER): Payer: Self-pay

## 2019-09-24 DIAGNOSIS — K5792 Diverticulitis of intestine, part unspecified, without perforation or abscess without bleeding: Secondary | ICD-10-CM | POA: Diagnosis present

## 2019-09-24 DIAGNOSIS — R509 Fever, unspecified: Secondary | ICD-10-CM | POA: Diagnosis present

## 2019-09-24 MED ORDER — CIPROFLOXACIN HCL 500 MG PO TABS
500.0000 mg | ORAL_TABLET | Freq: Two times a day (BID) | ORAL | 0 refills | Status: AC
Start: 2019-09-24 — End: 2019-10-01

## 2019-09-24 MED ORDER — IOHEXOL 300 MG/ML  SOLN
100.0000 mL | Freq: Once | INTRAMUSCULAR | Status: AC | PRN
  Administered 2019-09-24: 100 mL via INTRAVENOUS

## 2019-09-24 NOTE — Telephone Encounter (Signed)
Patient should be going for another CT.  See phone note

## 2019-09-29 ENCOUNTER — Encounter: Payer: Self-pay | Admitting: Family Medicine

## 2019-11-16 ENCOUNTER — Other Ambulatory Visit: Payer: Self-pay | Admitting: Family Medicine

## 2019-12-23 ENCOUNTER — Ambulatory Visit: Payer: Medicare Other | Admitting: Obstetrics & Gynecology

## 2020-01-04 ENCOUNTER — Ambulatory Visit (INDEPENDENT_AMBULATORY_CARE_PROVIDER_SITE_OTHER): Payer: Medicare Other | Admitting: Psychology

## 2020-01-04 DIAGNOSIS — F4323 Adjustment disorder with mixed anxiety and depressed mood: Secondary | ICD-10-CM

## 2020-01-06 DIAGNOSIS — Z20822 Contact with and (suspected) exposure to covid-19: Secondary | ICD-10-CM | POA: Diagnosis not present

## 2020-01-06 DIAGNOSIS — Z03818 Encounter for observation for suspected exposure to other biological agents ruled out: Secondary | ICD-10-CM | POA: Diagnosis not present

## 2020-01-12 DIAGNOSIS — Z23 Encounter for immunization: Secondary | ICD-10-CM | POA: Diagnosis not present

## 2020-01-24 DIAGNOSIS — Z23 Encounter for immunization: Secondary | ICD-10-CM | POA: Diagnosis not present

## 2020-01-25 ENCOUNTER — Ambulatory Visit (INDEPENDENT_AMBULATORY_CARE_PROVIDER_SITE_OTHER): Payer: Medicare Other | Admitting: Psychology

## 2020-01-25 DIAGNOSIS — F4323 Adjustment disorder with mixed anxiety and depressed mood: Secondary | ICD-10-CM | POA: Diagnosis not present

## 2020-01-31 ENCOUNTER — Other Ambulatory Visit: Payer: Self-pay | Admitting: Family Medicine

## 2020-02-03 ENCOUNTER — Other Ambulatory Visit: Payer: Self-pay | Admitting: Family Medicine

## 2020-02-08 ENCOUNTER — Ambulatory Visit (INDEPENDENT_AMBULATORY_CARE_PROVIDER_SITE_OTHER): Payer: Medicare Other | Admitting: Psychology

## 2020-02-08 DIAGNOSIS — F4323 Adjustment disorder with mixed anxiety and depressed mood: Secondary | ICD-10-CM

## 2020-02-29 ENCOUNTER — Ambulatory Visit (INDEPENDENT_AMBULATORY_CARE_PROVIDER_SITE_OTHER): Payer: Medicare Other | Admitting: Psychology

## 2020-02-29 DIAGNOSIS — F4323 Adjustment disorder with mixed anxiety and depressed mood: Secondary | ICD-10-CM

## 2020-03-14 ENCOUNTER — Ambulatory Visit (INDEPENDENT_AMBULATORY_CARE_PROVIDER_SITE_OTHER): Payer: Medicare Other | Admitting: Psychology

## 2020-03-14 DIAGNOSIS — F4323 Adjustment disorder with mixed anxiety and depressed mood: Secondary | ICD-10-CM

## 2020-03-16 ENCOUNTER — Other Ambulatory Visit: Payer: Self-pay

## 2020-03-16 ENCOUNTER — Encounter: Payer: Self-pay | Admitting: Family Medicine

## 2020-03-16 ENCOUNTER — Ambulatory Visit (INDEPENDENT_AMBULATORY_CARE_PROVIDER_SITE_OTHER): Payer: Medicare Other | Admitting: Family Medicine

## 2020-03-16 VITALS — BP 122/66 | HR 55 | Temp 98.5°F | Resp 16 | Wt 134.0 lb

## 2020-03-16 DIAGNOSIS — Z23 Encounter for immunization: Secondary | ICD-10-CM | POA: Diagnosis not present

## 2020-03-16 DIAGNOSIS — Z9189 Other specified personal risk factors, not elsewhere classified: Secondary | ICD-10-CM

## 2020-03-16 DIAGNOSIS — R011 Cardiac murmur, unspecified: Secondary | ICD-10-CM | POA: Diagnosis not present

## 2020-03-16 DIAGNOSIS — R739 Hyperglycemia, unspecified: Secondary | ICD-10-CM | POA: Diagnosis not present

## 2020-03-16 DIAGNOSIS — E039 Hypothyroidism, unspecified: Secondary | ICD-10-CM

## 2020-03-16 DIAGNOSIS — Z1211 Encounter for screening for malignant neoplasm of colon: Secondary | ICD-10-CM | POA: Diagnosis not present

## 2020-03-16 DIAGNOSIS — N6320 Unspecified lump in the left breast, unspecified quadrant: Secondary | ICD-10-CM | POA: Diagnosis not present

## 2020-03-16 DIAGNOSIS — S61411A Laceration without foreign body of right hand, initial encounter: Secondary | ICD-10-CM | POA: Diagnosis not present

## 2020-03-16 DIAGNOSIS — E782 Mixed hyperlipidemia: Secondary | ICD-10-CM | POA: Diagnosis not present

## 2020-03-16 DIAGNOSIS — Z1231 Encounter for screening mammogram for malignant neoplasm of breast: Secondary | ICD-10-CM | POA: Diagnosis not present

## 2020-03-16 DIAGNOSIS — K5792 Diverticulitis of intestine, part unspecified, without perforation or abscess without bleeding: Secondary | ICD-10-CM | POA: Diagnosis not present

## 2020-03-16 DIAGNOSIS — Z1239 Encounter for other screening for malignant neoplasm of breast: Secondary | ICD-10-CM

## 2020-03-16 LAB — COMPREHENSIVE METABOLIC PANEL
ALT: 15 U/L (ref 0–35)
AST: 21 U/L (ref 0–37)
Albumin: 4.1 g/dL (ref 3.5–5.2)
Alkaline Phosphatase: 77 U/L (ref 39–117)
BUN: 17 mg/dL (ref 6–23)
CO2: 33 mEq/L — ABNORMAL HIGH (ref 19–32)
Calcium: 9.6 mg/dL (ref 8.4–10.5)
Chloride: 100 mEq/L (ref 96–112)
Creatinine, Ser: 0.65 mg/dL (ref 0.40–1.20)
GFR: 89.91 mL/min (ref >60.00)
Glucose, Bld: 84 mg/dL (ref 70–99)
Potassium: 4.9 mEq/L (ref 3.5–5.1)
Sodium: 137 mEq/L (ref 135–145)
Total Bilirubin: 1 mg/dL (ref 0.2–1.2)
Total Protein: 6.6 g/dL (ref 6.0–8.3)

## 2020-03-16 LAB — CBC
HCT: 39 % (ref 36.0–46.0)
Hemoglobin: 13 g/dL (ref 12.0–15.0)
MCHC: 33.2 g/dL (ref 30.0–36.0)
MCV: 86.4 fl (ref 78.0–100.0)
Platelets: 256 10*3/uL (ref 150.0–400.0)
RBC: 4.52 Mil/uL (ref 3.87–5.11)
RDW: 13.2 % (ref 11.5–15.5)
WBC: 4.2 10*3/uL (ref 4.0–10.5)

## 2020-03-16 LAB — LIPID PANEL
Cholesterol: 182 mg/dL (ref 0–200)
HDL: 63 mg/dL (ref >39.00)
LDL Cholesterol: 108 mg/dL — ABNORMAL HIGH (ref 0–99)
NonHDL: 119.28
Total CHOL/HDL Ratio: 3
Triglycerides: 58 mg/dL (ref 0.0–149.0)
VLDL: 11.6 mg/dL (ref 0.0–40.0)

## 2020-03-16 LAB — HEMOGLOBIN A1C: Hgb A1c MFr Bld: 5.6 % (ref 4.6–6.5)

## 2020-03-16 LAB — TSH: TSH: 1.74 u[IU]/mL (ref 0.35–4.50)

## 2020-03-16 LAB — T4, FREE: Free T4: 0.96 ng/dL (ref 0.60–1.60)

## 2020-03-16 NOTE — Assessment & Plan Note (Signed)
On Levothyroxine, continue to monitor, T4 mildly elevated at last check, check labs today

## 2020-03-16 NOTE — Patient Instructions (Addendum)
[place] call insurance re: coverage and then proceed to pharmacy Diverticulosis  Diverticulosis is a condition that develops when small pouches (diverticula) form in the wall of the large intestine (colon). The colon is where water is absorbed and stool (feces) is formed. The pouches form when the inside layer of the colon pushes through weak spots in the outer layers of the colon. You may have a few pouches or many of them. The pouches usually do not cause problems unless they become inflamed or infected. When this happens, the condition is called diverticulitis. What are the causes? The cause of this condition is not known. What increases the risk? The following factors may make you more likely to develop this condition:  Being older than age 69. Your risk for this condition increases with age. Diverticulosis is rare among people younger than age 19. By age 21, many people have it.  Eating a low-fiber diet.  Having frequent constipation.  Being overweight.  Not getting enough exercise.  Smoking.  Taking over-the-counter pain medicines, like aspirin and ibuprofen.  Having a family history of diverticulosis. What are the signs or symptoms? In most people, there are no symptoms of this condition. If you do have symptoms, they may include:  Bloating.  Cramps in the abdomen.  Constipation or diarrhea.  Pain in the lower left side of the abdomen. How is this diagnosed? Because diverticulosis usually has no symptoms, it is most often diagnosed during an exam for other colon problems. The condition may be diagnosed by:  Using a flexible scope to examine the colon (colonoscopy).  Taking an X-ray of the colon after dye has been put into the colon (barium enema).  Having a CT scan. How is this treated? You may not need treatment for this condition. Your health care provider may recommend treatment to prevent problems. You may need treatment if you have symptoms or if you  previously had diverticulitis. Treatment may include:  Eating a high-fiber diet.  Taking a fiber supplement.  Taking a live bacteria supplement (probiotic).  Taking medicine to relax your colon. Follow these instructions at home: Medicines  Take over-the-counter and prescription medicines only as told by your health care provider.  If told by your health care provider, take a fiber supplement or probiotic. Constipation prevention Your condition may cause constipation. To prevent or treat constipation, you may need to:  Drink enough fluid to keep your urine pale yellow.  Take over-the-counter or prescription medicines.  Eat foods that are high in fiber, such as beans, whole grains, and fresh fruits and vegetables.  Limit foods that are high in fat and processed sugars, such as fried or sweet foods.  General instructions  Try not to strain when you have a bowel movement.  Keep all follow-up visits as told by your health care provider. This is important. Contact a health care provider if you:  Have pain in your abdomen.  Have bloating.  Have cramps.  Have not had a bowel movement in 3 days. Get help right away if:  Your pain gets worse.  Your bloating becomes very bad.  You have a fever or chills, and your symptoms suddenly get worse.  You vomit.  You have bowel movements that are bloody or black.  You have bleeding from your rectum. Summary  Diverticulosis is a condition that develops when small pouches (diverticula) form in the wall of the large intestine (colon).  You may have a few pouches or many of them.  This condition  is most often diagnosed during an exam for other colon problems.  Treatment may include increasing the fiber in your diet, taking supplements, or taking medicines. This information is not intended to replace advice given to you by your health care provider. Make sure you discuss any questions you have with your health care  provider. Document Revised: 10/22/2018 Document Reviewed: 10/22/2018 Elsevier Patient Education  [place].

## 2020-03-16 NOTE — Assessment & Plan Note (Signed)
Echo ordered today.

## 2020-03-16 NOTE — Assessment & Plan Note (Addendum)
Encouraged heart healthy diet, increase exercise, avoid trans fats, consider a krill oil cap daily. Tolerating Atorvastatin 

## 2020-03-16 NOTE — Assessment & Plan Note (Signed)
In June with fevers. Took two rounds of antibiotics before she responded. She still develops intermittent lower abdominal pain, just intermittent cramping. Has not had a colonoscopy since she arrived

## 2020-03-16 NOTE — Assessment & Plan Note (Signed)
hgba1c acceptable, minimize simple carbs. Increase exercise as tolerated.  

## 2020-03-17 ENCOUNTER — Encounter: Payer: Self-pay | Admitting: Family Medicine

## 2020-03-19 DIAGNOSIS — S61411A Laceration without foreign body of right hand, initial encounter: Secondary | ICD-10-CM | POA: Insufficient documentation

## 2020-03-19 NOTE — Assessment & Plan Note (Signed)
Healing cut on right hand, will boost with a Tdap

## 2020-03-19 NOTE — Progress Notes (Signed)
Subjective:    Patient ID: Crystal Russell, female    DOB: 1951/03/01, 69 y.o.   MRN: [id number]  Chief Complaint  Patient presents with  . Follow-up    Pt has no problems or concerns    HPI Patient is in today for follow up on chronic medical concerns. No recent febrile illness or hospitalizations. She did recently cut her right hand and it is healing. No increasing pain or redness. She is frustrated with hair loss and dry brittle skin and nails. Denies CP/palp/SOB/HA/congestion/fevers/GI or GU c/o. Taking meds as prescribed  Past Medical History:  Diagnosis Date  . Abdominal wall hernia 08/04/2012  . Atrial fibrillation ([place]) 2008   REPORTS SHE WAS AT [place], UPON WAKING I FELT MY HEART GOING BOOM BOOM BOOM BEATING OUT OF MY CHEST. I WENT TO HOSPITAL AND THEY ISED MEDICINE TO CONVERT ME , IT TOOK OVER 10 HOURS TO CONVERT ME. BUT AFTER THAT IVE NEVER HAD ANY ISSUES SINCE. I USED TO HAVE A CARDIOLOGUIST AS MY PRIMARY IN [place] BUT IHAVENT BEEN TO A CARDIOLOGIST SINCE I MOVED DOWN HERE.   Marland Kitchen Chicken pox as a child  . Depression   . Depression with anxiety 12/27/2012  . DJD (degenerative joint disease) 08/05/2012  . DVT (deep venous thrombosis) ([place]) 2018  . Fifth disease 1989   DX AFTER EXPOSURE FROM HER CHILDREN; DEVELOPED RHEUMATOID ARTHRITIS DURING COURSE OF ILLNESS. REPORTS:  " I DONT HAVE IT ANYMORE SINCE THE DISEASE IS GONE"   . H/O gestational diabetes mellitus, not currently pregnant 08/05/2012   History of   . Heart murmur    echo 2012; "I HAVE A FUNCTIONAL MURMUR"   . Hyperlipidemia   . Hypothyroidism   . Knee effusion, right    while on Eliquis, discontinued Eliquis after DVT resolved  . Measles as a child  . Other and unspecified hyperlipidemia 08/05/2012  . Paroxysmal A-fib ([place])   . Shingles 43  . Spondylisthesis 2012   L5/S1 grade 3  . Varicose veins 08/05/2012   Extending up to right groin B/l LE  . Ventral hernia 08/05/2012  . Wears glasses     Past Surgical  History:  Procedure Laterality Date  . BREAST DUCTAL SYSTEM EXCISION Right 05/05/2014   Procedure: RIGHT BREAST DUCT EXCISION;  Surgeon: Rolm Bookbinder, MD;  Location: [facility];  Service: General;  Laterality: Right;  . COLONOSCOPY    . DILATION AND CURETTAGE OF UTERUS     x4  . TOTAL KNEE ARTHROPLASTY Right 07/01/2017   Procedure: RIGHT TOTAL KNEE ARTHROPLASTY;  Surgeon: Paralee Cancel, MD;  Location: [facility];  Service: Orthopedics;  Laterality: Right;  70 mins  . [place]   right leg    Family History  Problem Relation Age of Onset  . Cancer Mother        lung- smoker  . Liver disease Mother   . Cancer Father        lung- smoker  . Peripheral Artery Disease Sister   . Dementia Maternal Grandmother   . Heart attack Maternal Grandfather   . Asthma Son   . Ulcerative colitis Son   . Asthma Son   . Ulcerative colitis Son   . Heart attack Paternal Uncle     Social History   Socioeconomic History  . Marital status: Married    Spouse name: Not on file  . Number of children: Not on file  . Years of education: Not on  file  . Highest education level: Not on file  Occupational History  . Not on file  Tobacco Use  . Smoking status: Former Smoker    Years: 4.00    Types: Cigarettes  . Smokeless tobacco: Never Used  Vaping Use  . Vaping Use: Never used  Substance and Sexual Activity  . Alcohol use: No  . Drug use: No  . Sexual activity: Yes  Other Topics Concern  . Not on file  Social History Narrative  . Not on file   Social Determinants of Health   Financial Resource Strain: Not on file  Food Insecurity: Not on file  Transportation Needs: Not on file  Physical Activity: Not on file  Stress: Not on file  Social Connections: Not on file  Intimate Partner Violence: Not on file    Outpatient Medications Prior to Visit  Medication Sig Dispense Refill  . aspirin EC 81 MG tablet Take 81 mg by mouth daily. Swallow whole.    Marland Kitchen  atorvastatin (LIPITOR) 10 MG tablet Take 0.5 tablets (5 mg total) by mouth daily. 45 tablet 0  . Co-Enzyme Q-10 100 MG CAPS Take 300 mg by mouth daily.    Marland Kitchen levothyroxine (SYNTHROID) 100 MCG tablet Take 1 tablet (100 mcg total) by mouth daily. 90 tablet 3  . polyethylene glycol (MIRALAX / GLYCOLAX) 17 g packet 1/2 dose every day up to 1 dose bid as needed (Patient taking differently: Take 8.5-17 g by mouth daily as needed for mild constipation.) 14 each 0  . dicyclomine (BENTYL) 10 MG capsule Take 1 tab every 6 hours as needed for abdominal cramping. 60 capsule 0  . ondansetron (ZOFRAN-ODT) 4 MG disintegrating tablet Take 1 tablet (4 mg total) by mouth every 8 (eight) hours as needed for nausea or vomiting. 20 tablet 0  . Wheat Dextrin (BENEFIBER) POWD Take by mouth See admin instructions. Mix 1-2 teaspoonsful of powder into 4-12 ounces of water and drink once a day as needed to increase dietary fiber     No facility-administered medications prior to visit.    Allergies  Allergen Reactions  . Crestor [Rosuvastatin] Other (See Comments)    Joint pain w/fibromyalgia  . Pravastatin Other (See Comments)    Joint pain w/fibromyalgia  . Penicillins Swelling, Rash and Other (See Comments)    Hands and feet swell, rash; childhood allergy  Has patient had a PCN reaction causing immediate rash, facial/tongue/throat swelling, SOB or lightheadedness with hypotension: Unknown Has patient had a PCN reaction causing severe rash involving mucus membranes or skin necrosis: Unknown Has patient had a PCN reaction that required hospitalization: Yes Has patient had a PCN reaction occurring within the last 10 years: No If all of the above answers are "NO", then may proceed with Cephalosporin use.     Review of Systems  Constitutional: Negative for fever and malaise/fatigue.  HENT: Negative for congestion.   Eyes: Negative for blurred vision.  Respiratory: Negative for shortness of breath.   Cardiovascular:  Negative for chest pain, palpitations and leg swelling.  Gastrointestinal: Negative for abdominal pain, blood in stool and nausea.  Genitourinary: Negative for dysuria and frequency.  Musculoskeletal: Negative for falls.  Skin: Negative for rash.  Neurological: Negative for dizziness, loss of consciousness and headaches.  Endo/Heme/Allergies: Negative for environmental allergies.  Psychiatric/Behavioral: Negative for depression. The patient is not nervous/anxious.        Objective:    Physical Exam Vitals and nursing note reviewed.  Constitutional:  General: She is not in acute distress.    Appearance: She is well-developed and well-nourished.  HENT:     Head: Normocephalic and atraumatic.     Nose: Nose normal.  Eyes:     General:        Right eye: No discharge.        Left eye: No discharge.  Cardiovascular:     Rate and Rhythm: Normal rate and regular rhythm.     Heart sounds: No murmur heard.   Pulmonary:     Effort: Pulmonary effort is normal.     Breath sounds: Normal breath sounds.  Abdominal:     General: Bowel sounds are normal.     Palpations: Abdomen is soft.     Tenderness: There is no abdominal tenderness.  Musculoskeletal:        General: No edema.     Cervical back: Normal range of motion and neck supple.  Skin:    General: Skin is warm and dry.  Neurological:     Mental Status: She is alert and oriented to person, place, and time.  Psychiatric:        Mood and Affect: Mood and affect normal.     BP 122/66   Pulse (!) 55   Temp 98.5 F (36.9 C) (Oral)   Resp 16   Wt 134 lb (60.8 kg)   SpO2 94%   BMI 23.00 kg/m  Wt Readings from Last 3 Encounters:  03/16/20 134 lb (60.8 kg)  09/19/19 131 lb (59.4 kg)  09/14/19 132 lb (59.9 kg)    Diabetic Foot Exam - Simple   No data filed    Lab Results  Component Value Date   WBC 4.2 03/16/2020   HGB 13.0 03/16/2020   HCT 39.0 03/16/2020   PLT 256.0 03/16/2020   GLUCOSE 84 03/16/2020    CHOL 182 03/16/2020   TRIG 58.0 03/16/2020   HDL 63.00 03/16/2020   LDLCALC 108 (H) 03/16/2020   ALT 15 03/16/2020   AST 21 03/16/2020   NA 137 03/16/2020   K 4.9 03/16/2020   CL 100 03/16/2020   CREATININE 0.65 03/16/2020   BUN 17 03/16/2020   CO2 33 (H) 03/16/2020   TSH 1.74 03/16/2020   HGBA1C 5.6 03/16/2020    Lab Results  Component Value Date   TSH 1.74 03/16/2020   Lab Results  Component Value Date   WBC 4.2 03/16/2020   HGB 13.0 03/16/2020   HCT 39.0 03/16/2020   MCV 86.4 03/16/2020   PLT 256.0 03/16/2020   Lab Results  Component Value Date   NA 137 03/16/2020   K 4.9 03/16/2020   CO2 33 (H) 03/16/2020   GLUCOSE 84 03/16/2020   BUN 17 03/16/2020   CREATININE 0.65 03/16/2020   BILITOT 1.0 03/16/2020   ALKPHOS 77 03/16/2020   AST 21 03/16/2020   ALT 15 03/16/2020   PROT 6.6 03/16/2020   ALBUMIN 4.1 03/16/2020   CALCIUM 9.6 03/16/2020   ANIONGAP 10 09/19/2019   GFR 89.91 03/16/2020   Lab Results  Component Value Date   CHOL 182 03/16/2020   Lab Results  Component Value Date   HDL 63.00 03/16/2020   Lab Results  Component Value Date   LDLCALC 108 (H) 03/16/2020   Lab Results  Component Value Date   TRIG 58.0 03/16/2020   Lab Results  Component Value Date   CHOLHDL 3 03/16/2020   Lab Results  Component Value Date   HGBA1C 5.6 03/16/2020  Assessment & Plan:   Problem List Items Addressed This Visit    Heart murmur    Echo ordered today      Relevant Orders   ECHOCARDIOGRAM COMPLETE   Hyperlipidemia, mixed    Encouraged heart healthy diet, increase exercise, avoid trans fats, consider a krill oil cap daily. Tolerating Atorvastatin      Relevant Orders   Lipid panel (Completed)   Hypothyroidism    On Levothyroxine, continue to monitor, T4 mildly elevated at last check, check labs today      Relevant Orders   TSH (Completed)   T4, free (Completed)   Breast mass, left   Relevant Orders   MM DIGITAL SCREENING BILATERAL    Diverticulitis    In June with fevers. Took two rounds of antibiotics before she responded. She still develops intermittent lower abdominal pain, just intermittent cramping. Has not had a colonoscopy since she arrived      Relevant Orders   Ambulatory referral to Gastroenterology   CBC (Completed)   Hyperglycemia    hgba1c acceptable, minimize simple carbs. Increase exercise as tolerated.       Relevant Orders   Hemoglobin A1c (Completed)   Comprehensive metabolic panel (Completed)   Laceration of skin of right hand    Healing cut on right hand, will boost with a Tdap       Other Visit Diagnoses    Colon cancer screening    -  Primary   Relevant Orders   Ambulatory referral to Gastroenterology   At high risk for breast cancer       Relevant Orders   MM DIGITAL SCREENING BILATERAL   Encounter for screening mammogram for malignant neoplasm of breast       Breast cancer screening, high risk patient       Relevant Orders   MM DIGITAL SCREENING BILATERAL   Need for pneumococcal vaccination       Relevant Orders   Pneumococcal polysaccharide vaccine 23-valent greater than or equal to 2yo subcutaneous/IM (Completed)   Need for Tdap vaccination       Relevant Orders   Tdap vaccine greater than or equal to 7yo IM (Completed)      I am having [name] maintain her Co-Enzyme Q-10, levothyroxine, polyethylene glycol, aspirin EC, Benefiber, ondansetron, dicyclomine, and atorvastatin.  No orders of the defined types were placed in this encounter.    Penni Homans, MD

## 2020-03-28 ENCOUNTER — Ambulatory Visit (INDEPENDENT_AMBULATORY_CARE_PROVIDER_SITE_OTHER): Payer: Medicare Other | Admitting: Psychology

## 2020-03-28 DIAGNOSIS — F4323 Adjustment disorder with mixed anxiety and depressed mood: Secondary | ICD-10-CM | POA: Diagnosis not present

## 2020-03-30 ENCOUNTER — Other Ambulatory Visit: Payer: Self-pay | Admitting: Family Medicine

## 2020-04-06 ENCOUNTER — Other Ambulatory Visit: Payer: Self-pay | Admitting: Family Medicine

## 2020-04-06 ENCOUNTER — Ambulatory Visit (HOSPITAL_COMMUNITY)
Admission: RE | Admit: 2020-04-06 | Discharge: 2020-04-06 | Disposition: A | Discharge status: Home / Self Care | Payer: Medicare Other | Source: Ambulatory Visit | Attending: Family Medicine | Admitting: Family Medicine

## 2020-04-06 ENCOUNTER — Other Ambulatory Visit: Payer: Self-pay

## 2020-04-06 DIAGNOSIS — E785 Hyperlipidemia, unspecified: Secondary | ICD-10-CM | POA: Insufficient documentation

## 2020-04-06 DIAGNOSIS — I517 Cardiomegaly: Secondary | ICD-10-CM

## 2020-04-06 DIAGNOSIS — I4891 Unspecified atrial fibrillation: Secondary | ICD-10-CM | POA: Insufficient documentation

## 2020-04-06 DIAGNOSIS — I083 Combined rheumatic disorders of mitral, aortic and tricuspid valves: Secondary | ICD-10-CM | POA: Insufficient documentation

## 2020-04-06 DIAGNOSIS — R011 Cardiac murmur, unspecified: Secondary | ICD-10-CM | POA: Diagnosis not present

## 2020-04-06 DIAGNOSIS — I38 Endocarditis, valve unspecified: Secondary | ICD-10-CM

## 2020-04-06 DIAGNOSIS — E039 Hypothyroidism, unspecified: Secondary | ICD-10-CM | POA: Insufficient documentation

## 2020-04-06 LAB — ECHOCARDIOGRAM COMPLETE
Area-P 1/2: 2.39 cm2
P 1/2 time: 775 msec
S' Lateral: 2.6 cm

## 2020-04-06 NOTE — Progress Notes (Signed)
  Echocardiogram 2D Echocardiogram has been performed.  Nasteho Glantz G Telma Pyeatt 04/06/2020, 9:05 AM

## 2020-04-11 ENCOUNTER — Ambulatory Visit (INDEPENDENT_AMBULATORY_CARE_PROVIDER_SITE_OTHER): Payer: Medicare Other | Admitting: Psychology

## 2020-04-11 DIAGNOSIS — F4323 Adjustment disorder with mixed anxiety and depressed mood: Secondary | ICD-10-CM

## 2020-04-26 ENCOUNTER — Ambulatory Visit (INDEPENDENT_AMBULATORY_CARE_PROVIDER_SITE_OTHER): Payer: Medicare Other | Admitting: Psychology

## 2020-04-26 DIAGNOSIS — F4323 Adjustment disorder with mixed anxiety and depressed mood: Secondary | ICD-10-CM

## 2020-05-01 ENCOUNTER — Other Ambulatory Visit: Payer: Self-pay

## 2020-05-01 ENCOUNTER — Encounter: Payer: Self-pay | Admitting: Internal Medicine

## 2020-05-01 ENCOUNTER — Ambulatory Visit (INDEPENDENT_AMBULATORY_CARE_PROVIDER_SITE_OTHER): Payer: Medicare Other | Admitting: Internal Medicine

## 2020-05-01 VITALS — BP 122/62 | HR 55 | Ht 64.0 in | Wt 133.0 lb

## 2020-05-01 DIAGNOSIS — Q211 Atrial septal defect: Secondary | ICD-10-CM

## 2020-05-01 DIAGNOSIS — I351 Nonrheumatic aortic (valve) insufficiency: Secondary | ICD-10-CM | POA: Insufficient documentation

## 2020-05-01 DIAGNOSIS — I480 Paroxysmal atrial fibrillation: Secondary | ICD-10-CM | POA: Diagnosis not present

## 2020-05-01 DIAGNOSIS — E039 Hypothyroidism, unspecified: Secondary | ICD-10-CM

## 2020-05-01 DIAGNOSIS — I340 Nonrheumatic mitral (valve) insufficiency: Secondary | ICD-10-CM | POA: Insufficient documentation

## 2020-05-01 DIAGNOSIS — Q2112 Patent foramen ovale: Secondary | ICD-10-CM | POA: Insufficient documentation

## 2020-05-01 NOTE — Patient Instructions (Signed)
Medication Instructions:  Your physician recommends that you continue on your current medications as directed. Please refer to the Current Medication list given to you today.  *If you need a refill on your cardiac medications before your next appointment, please call your pharmacy*   Lab Work: None ordered   If you have labs (blood work) drawn today and your tests are completely normal, you will receive your results only by: Marland Kitchen MyChart Message (if you have MyChart) OR . A paper copy in the mail If you have any lab test that is abnormal or we need to change your treatment, we will call you to review the results.   Testing/Procedures: Your physician has recommended that you wear an event monitor. Event monitors are medical devices that record the heart's electrical activity. Doctors most often [place] these monitors to diagnose arrhythmias. Arrhythmias are problems with the speed or rhythm of the heartbeat. The monitor is a small, portable device. You can wear one while you do your normal daily activities. This is usually used to diagnose what is causing palpitations/syncope (passing out).   Follow-Up: At [facility], you and your health needs are our priority.  As part of our continuing mission to provide you with exceptional heart care, we have created designated Provider Care Teams.  These Care Teams include your primary Cardiologist (physician) and Advanced Practice Providers (APPs -  Physician Assistants and Nurse Practitioners) who all work together to provide you with the care you need, when you need it.  We recommend signing up for the patient portal called "MyChart".  Sign up information is provided on this After Visit Summary.  MyChart is used to connect with patients for Virtual Visits (Telemedicine).  Patients are able to view lab/test results, encounter notes, upcoming appointments, etc.  Non-urgent messages can be sent to your provider as well.   To learn more about what you can do  with MyChart, go to [email or web address].    Your next appointment:   3 month(s)  The format for your next appointment:   In Person  Provider:   You may see Dr. Rudean Haskell or one of the following Advanced Practice Providers on your designated Care Team:    Melina Copa, PA-C  Ermalinda Barrios, PA-C    Other Instructions None

## 2020-05-01 NOTE — Progress Notes (Signed)
Cardiology Office Note:    Date:  05/01/2020   ID:  Crystal Russell, DOB [birth year], MRN [id number]  PCP:  Mosie Lukes, MD  [place] Cardiologist:  No primary care provider on file.  [facility] Electrophysiologist:  None   CC: AF with valve issues Consulted for the evaluation of PAF at the behest of Mosie Lukes, MD  History of Present Illness:    Crystal Russell is a 70 y.o. female with a hx of PAF in [place], Mild Atrial Functional MR, Mild to moderate AI without LV dilation, PFO, possible distant DVT possible history of rheumatic and HLD who presents for evaluation 05/01/20.  Patient notes that she is feeling occasional PAF.  Notes a positional component.  Only last a few seconds.  Nothing like her 2012 hospitalization for AF in 2012.  Tried medications in the past and stopped because she didn't feel well (NOS).  Lost in 80 lbs and had improvement in symptoms.  No issues.  No palpitations.  Has concerns about low heart rate that comes up with activity.  Has had no chest pain, chest pressure, chest tightness, chest stinging.  Palpitations occur with position changes, worsens at night, and improves with out intervention.  Patient exertion notable for vigorous walks with interval training for 1.5 hours feels no symptoms.  No shortness of breath, DOE .  No PND or orthopnea.  No bendopnea, weight gain, leg swelling , or abdominal swelling.  No syncope or near syncope.  Patient reports prior cardiac testing including recent echocardiogram 04/06/2020.  No history of pre-eclampsia or prematurity.  No Fen-Phen use.   Past Medical History:  Diagnosis Date  . Abdominal wall hernia 08/04/2012  . Atrial fibrillation ([place]) 2008   REPORTS SHE WAS AT [place], UPON WAKING I FELT MY HEART GOING BOOM BOOM BOOM BEATING OUT OF MY CHEST. I WENT TO HOSPITAL AND THEY ISED MEDICINE TO CONVERT ME , IT TOOK OVER 10 HOURS TO CONVERT ME. BUT AFTER THAT IVE NEVER HAD ANY ISSUES SINCE. I USED TO HAVE A  CARDIOLOGUIST AS MY PRIMARY IN [place] BUT IHAVENT BEEN TO A CARDIOLOGIST SINCE I MOVED DOWN HERE.   Marland Kitchen Chicken pox as a child  . Depression   . Depression with anxiety 12/27/2012  . DJD (degenerative joint disease) 08/05/2012  . DVT (deep venous thrombosis) ([place]) 2018  . Fifth disease 1989   DX AFTER EXPOSURE FROM HER CHILDREN; DEVELOPED RHEUMATOID ARTHRITIS DURING COURSE OF ILLNESS. REPORTS:  " I DONT HAVE IT ANYMORE SINCE THE DISEASE IS GONE"   . H/O gestational diabetes mellitus, not currently pregnant 08/05/2012   History of   . Heart murmur    echo 2012; "I HAVE A FUNCTIONAL MURMUR"   . Hyperlipidemia   . Hypothyroidism   . Knee effusion, right    while on Eliquis, discontinued Eliquis after DVT resolved  . Measles as a child  . Other and unspecified hyperlipidemia 08/05/2012  . Paroxysmal A-fib ([place])   . Shingles 43  . Spondylisthesis 2012   L5/S1 grade 3  . Varicose veins 08/05/2012   Extending up to right groin B/l LE  . Ventral hernia 08/05/2012  . Wears glasses     Past Surgical History:  Procedure Laterality Date  . BREAST DUCTAL SYSTEM EXCISION Right 05/05/2014   Procedure: RIGHT BREAST DUCT EXCISION;  Surgeon: Rolm Bookbinder, MD;  Location: [facility];  Service: General;  Laterality: Right;  . COLONOSCOPY    . DILATION AND CURETTAGE  OF UTERUS     x4  . TOTAL KNEE ARTHROPLASTY Right 07/01/2017   Procedure: RIGHT TOTAL KNEE ARTHROPLASTY;  Surgeon: Paralee Cancel, MD;  Location[facility];  Service: Orthopedics;  Laterality: Right;  70 mins  . [place]   right leg    Current Medications: Current Meds  Medication Sig  . aspirin EC 81 MG tablet Take 81 mg by mouth daily. Swallow whole.  Marland Kitchen atorvastatin (LIPITOR) 10 MG tablet Take 0.5 tablets (5 mg total) by mouth daily.  . Cholecalciferol (VITAMIN D) 125 MCG (5000 UT) CAPS Take 5,000 Units by mouth daily.  Marland Kitchen Co-Enzyme Q-10 100 MG CAPS Take 300 mg by mouth daily.  Marland Kitchen levothyroxine  (SYNTHROID) 100 MCG tablet Take 1 tablet (100 mcg total) by mouth daily before breakfast.  . polyethylene glycol (MIRALAX / GLYCOLAX) 17 g packet 17 g. Patient takes 2 doses by mouth once daily     Allergies:   Crestor [rosuvastatin], Pravastatin, and Penicillins   Social History   Socioeconomic History  . Marital status: Married    Spouse name: Not on file  . Number of children: Not on file  . Years of education: Not on file  . Highest education level: Not on file  Occupational History  . Not on file  Tobacco Use  . Smoking status: Former Smoker    Years: 4.00    Types: Cigarettes  . Smokeless tobacco: Never Used  Vaping Use  . Vaping Use: Never used  Substance and Sexual Activity  . Alcohol use: No  . Drug use: No  . Sexual activity: Yes  Other Topics Concern  . Not on file  Social History Narrative  . Not on file   Social Determinants of Health   Financial Resource Strain: Not on file  Food Insecurity: Not on file  Transportation Needs: Not on file  Physical Activity: Not on file  Stress: Not on file  Social Connections: Not on file    SOCIAL: Son has UC; one son [name] and very sick with [place], one son with two kids.  Doesn't do well with percentages in discussion  Family History: The patient's family history includes Asthma in her son and son; Cancer in her father and mother; Dementia in her maternal grandmother; Heart attack in her maternal grandfather and paternal uncle; Liver disease in her mother; Peripheral Artery Disease in her sister; Ulcerative colitis in her son and son. History of coronary artery disease notable for grandfather. History of heart failure notable for no members. History of arrhythmia notable for no members. No history of bicuspid aortic valve or aortic aneurysm or dissection.  ROS:   Please see the history of present illness.    All other systems reviewed and are negative.  EKGs/Labs/Other Studies Reviewed:    The following  studies were reviewed today:  EKG:   05/01/2020: sinus Bradycardia rate 55 WNL 06/24/2017: Sinus Bradycardia Rate 51  Transthoracic Echocardiogram: Date: 04/06/2020 Results: Personally read and reported 1. Left ventricular ejection fraction, by estimation, is 55 to 60%. The  left ventricle has normal function. The left ventricle has no regional  wall motion abnormalities. Left ventricular diastolic parameters are  indeterminate.  2. Right ventricular systolic function is normal. The right ventricular  size is normal. Mildly increased right ventricular wall thickness. There  is normal pulmonary artery systolic pressure.  3. Left atrial size was moderate to severely dilated.  4. The mitral valve is grossly normal. Mild mitral valve regurgitation.  5. The aortic valve is tricuspid. Aortic valve regurgitation is mild to  moderate. No aortic stenosis is present.  6. The inferior vena cava is dilated in size with >50% respiratory  variability, suggesting right atrial pressure of 8 mmHg.  Report from 2021 noted mild AI and MR, small PFO with L-R shunt  Normal Carotid Artery Duplex- per patient  Recent Labs: 03/16/2020: ALT 15; BUN 17; Creatinine, Ser 0.65; Hemoglobin 13.0; Platelets 256.0; Potassium 4.9; Sodium 137; TSH 1.74  Recent Lipid Panel    Component Value Date/Time   CHOL 182 03/16/2020 0913   TRIG 58.0 03/16/2020 0913   HDL 63.00 03/16/2020 0913   CHOLHDL 3 03/16/2020 0913   VLDL 11.6 03/16/2020 0913   LDLCALC 108 (H) 03/16/2020 0913    Risk Assessment/Calculations:    CHA2DS2-VASc Score = 2  This indicates a 2.2% annual risk of stroke. The patient's score is based upon: CHF History: No HTN History: No Diabetes History: No Stroke History: No Vascular Disease History: No Age Score: 1 Gender Score: 1   The 10-year ASCVD risk score Mikey Bussing DC Jr., et al., 2013) is: 7.3%   Values used to calculate the score:     Age: 45 years     Sex: Female     Is Non-Hispanic  African American: No     Diabetic: No     Tobacco smoker: No     Systolic Blood Pressure: 629 mmHg     Is BP treated: No     HDL Cholesterol: 63 mg/dL     Total Cholesterol: 182 mg/dL    Physical Exam:    VS:  BP 122/62   Pulse (!) 55   Ht 5\' 4"  (1.626 m)   Wt 133 lb (60.3 kg)   SpO2 97%   BMI 22.83 kg/m     Wt Readings from Last 3 Encounters:  05/01/20 133 lb (60.3 kg)  03/16/20 134 lb (60.8 kg)  09/19/19 131 lb (59.4 kg)   GEN:  Well nourished, well developed in no acute distress HEENT: Normal NECK: No JVD; No carotid bruits LYMPHATICS: No lymphadenopathy CARDIAC: Bradycardic rate and rhythm, no murmurs, rubs, gallops RESPIRATORY:  Clear to auscultation without rales, wheezing or rhonchi  ABDOMEN: Soft, non-tender, non-distended MUSCULOSKELETAL:  No edema; No deformity  SKIN: Warm and dry NEUROLOGIC:  Alert and oriented x 3 PSYCHIATRIC:  Normal affect   ASSESSMENT:    1. PAF (paroxysmal atrial fibrillation) ([place])   2. Mild aortic valve regurgitation   3. Mild mitral regurgitation   4. PFO (patent foramen ovale)   5. Hypothyroidism, unspecified type    PLAN:    In order of problems listed above:  Paroxysmal Atrial Fibrillation Hypothyroidism on synthroid - Risk factors include gender and femal - CHADSVASC=2. - TSH 1.74, imaging notable for moderate to severe  left atrial dilation by volume - discussed alcohol and exercise as preventive factors - OSA Risk is low - Continue aspirin   Mild to Moderate Aortic Insufficiency Mid Mitral Regurgitation PFO - asymptomatic - MR is atrial functional and will push [place] to a more aggressive rhythm control strategy - One year follow up echocardiogram  Hyperlipidemia (mixed) -LDL goal less than 100 at 108 -continue current statin; patient has chosen to take CoQ 110 - has had issues with other statins - can discuss zetia at future visits, ASCVD risk 7.5%  - gave education on dietary changes  Three Month follow up  unless new symptoms or abnormal test results warranting change  in plan  Would be reasonable for  Virtual Follow up  Would be reasonable for  APP Follow up        Medication Adjustments/Labs and Tests Ordered: Current medicines are reviewed at length with the patient today.  Concerns regarding medicines are outlined above.  Orders Placed This Encounter  Procedures  . CARDIAC EVENT MONITOR   No orders of the defined types were placed in this encounter.   Patient Instructions  Medication Instructions:  Your physician recommends that you continue on your current medications as directed. Please refer to the Current Medication list given to you today.  *If you need a refill on your cardiac medications before your next appointment, please call your pharmacy*   Lab Work: None ordered   If you have labs (blood work) drawn today and your tests are completely normal, you will receive your results only by: Marland Kitchen MyChart Message (if you have MyChart) OR . A paper copy in the mail If you have any lab test that is abnormal or we need to change your treatment, we will call you to review the results.   Testing/Procedures: Your physician has recommended that you wear an event monitor. Event monitors are medical devices that record the heart's electrical activity. Doctors most often [place] these monitors to diagnose arrhythmias. Arrhythmias are problems with the speed or rhythm of the heartbeat. The monitor is a small, portable device. You can wear one while you do your normal daily activities. This is usually used to diagnose what is causing palpitations/syncope (passing out).   Follow-Up: At [facility], you and your health needs are our priority.  As part of our continuing mission to provide you with exceptional heart care, we have created designated Provider Care Teams.  These Care Teams include your primary Cardiologist (physician) and Advanced Practice Providers (APPs -  Physician Assistants  and Nurse Practitioners) who all work together to provide you with the care you need, when you need it.  We recommend signing up for the patient portal called "MyChart".  Sign up information is provided on this After Visit Summary.  MyChart is used to connect with patients for Virtual Visits (Telemedicine).  Patients are able to view lab/test results, encounter notes, upcoming appointments, etc.  Non-urgent messages can be sent to your provider as well.   To learn more about what you can do with MyChart, go to [email or web address].    Your next appointment:   3 month(s)  The format for your next appointment:   In Person  Provider:   You may see Dr. Rudean Haskell or one of the following Advanced Practice Providers on your designated Care Team:    Melina Copa, PA-C  Ermalinda Barrios, PA-C    Other Instructions None      Signed, Werner Lean, MD  05/01/2020 12:03 PM    [place]

## 2020-05-03 ENCOUNTER — Ambulatory Visit (INDEPENDENT_AMBULATORY_CARE_PROVIDER_SITE_OTHER): Payer: Medicare Other

## 2020-05-03 DIAGNOSIS — I480 Paroxysmal atrial fibrillation: Secondary | ICD-10-CM | POA: Diagnosis not present

## 2020-05-03 NOTE — Addendum Note (Signed)
Addended by: Briant Cedar on: 05/03/2020 04:23 PM   Modules accepted: Orders

## 2020-05-09 ENCOUNTER — Ambulatory Visit (INDEPENDENT_AMBULATORY_CARE_PROVIDER_SITE_OTHER): Payer: Medicare Other | Admitting: Psychology

## 2020-05-09 DIAGNOSIS — F4323 Adjustment disorder with mixed anxiety and depressed mood: Secondary | ICD-10-CM

## 2020-05-12 ENCOUNTER — Other Ambulatory Visit: Payer: Self-pay | Admitting: Family Medicine

## 2020-05-24 ENCOUNTER — Ambulatory Visit (INDEPENDENT_AMBULATORY_CARE_PROVIDER_SITE_OTHER): Payer: Medicare Other | Admitting: Psychology

## 2020-05-24 DIAGNOSIS — F4323 Adjustment disorder with mixed anxiety and depressed mood: Secondary | ICD-10-CM

## 2020-06-07 ENCOUNTER — Encounter: Payer: Self-pay | Admitting: Gastroenterology

## 2020-06-07 ENCOUNTER — Other Ambulatory Visit: Payer: Self-pay

## 2020-06-07 ENCOUNTER — Ambulatory Visit (INDEPENDENT_AMBULATORY_CARE_PROVIDER_SITE_OTHER): Payer: Medicare Other | Admitting: Gastroenterology

## 2020-06-07 VITALS — BP 104/68 | HR 56 | Ht 63.0 in | Wt 133.0 lb

## 2020-06-07 DIAGNOSIS — K5900 Constipation, unspecified: Secondary | ICD-10-CM | POA: Diagnosis not present

## 2020-06-07 DIAGNOSIS — K5732 Diverticulitis of large intestine without perforation or abscess without bleeding: Secondary | ICD-10-CM | POA: Diagnosis not present

## 2020-06-07 NOTE — Patient Instructions (Signed)
If you are age 70 or older, your body mass index should be between 23-30. Your Body mass index is 23.56 kg/m. If this is out of the aforementioned range listed, please consider follow up with your Primary Care Provider.  If you are age 70 or younger, your body mass index should be between 19-25. Your Body mass index is 23.56 kg/m. If this is out of the aformentioned range listed, please consider follow up with your Primary Care Provider.    Continue Miralax twice a day.   It has been recommended to you by your physician that you have a(n) colonoscopy completed. Per your request, we did not schedule the procedure(s) today. Please contact our office at [phone] when you decide to have the procedure completed. You will be scheduled for a pre-visit and procedure at that time.  Thank you for entrusting me with your care and for choosing [facility], Dr.  Cellar

## 2020-06-07 NOTE — Progress Notes (Signed)
HPI :  70 y/o female here to re-establish care at our office for recent episode of diverticulitis and colon cancer screening  Recall she was seen by our office in 2016 where we discussed her constipation and reviewed prior colonoscopy exams.  Her last colonoscopy was in April 2010 - normal exam other than diverticulosis and internal hemorrhoids. She has no FH of colon cancer, but has 2 sons with IBD, colitis.   She had been in her usual state of health when she developed lower abdominal pain that was severe and an associated fever in June 2021. She had a CT scan showing sigmoid diverticulitis and was placed on antibiotics. She failed to improve initially and had persistent fever up to 102, had a repeat CT scan 5 days later which again showed diverticulitis in the sigmoid colon without complications. She states her antibiotics were changed and she eventually improved over the course of a few weeks.  This is her first episode of diverticulitis, states she is never had that before.  She has recovered since that time without any recurrence of severe pain or much pain in general, but had mild discomfort in recent days.  She likes to eat a lot of fresh fruit and salads, has been restricting her diet to some extent in light of this occurrence.  She has been taking MiraLAX twice daily which helps keep her stools soft and is having usually 1 formed bowel movement per day.  No straining.  No blood in her stools.  She has had no recurrent fevers.  She has had a history of atrial fibrillation which is being monitored by cardiology right now, she just completed a 1 month monitor and has follow-up with them in a few weeks.  She is currently not on any anticoagulation other than a baby aspirin.  Her preference is to hold off on colonoscopy until she has had her follow-up with cardiology.  Echo shows normal ejection fraction.  Otherwise her son has severe colitis and we spent a few minutes discussing his case today  as well.Marland Kitchen He inquires if there is a CCFA chapter in [place] for patient support of IBD.  Last colonoscopy done in [place] in 2010 April - normal exam other than diverticulosis and internal hemorrhoids. Capsule endoscopy 2010 - normal EGD 2010 - benign gastric polyp   CT abdomen / pelvis 09/19/19 - IMPRESSION: Changes consistent with diverticulitis in the distal sigmoid/rectosigmoid region. No abscess or perforation is noted.   CT abdomen / pelvis 09/24/19:IMPRESSION: 1. Sigmoid diverticulosis with mild sigmoid diverticulitis. 2. Small fat containing ventral hernia. 3. Stable grade 1 anterolisthesis of the L5 vertebral body on S1 with marked severity degenerative changes at this level.   Echo 04/06/20 - EF 55-60%   Past Medical History:  Diagnosis Date  . Abdominal wall hernia 08/04/2012  . Atrial fibrillation ([place]) 2008   REPORTS SHE WAS AT [place], UPON WAKING I FELT MY HEART GOING BOOM BOOM BOOM BEATING OUT OF MY CHEST. I WENT TO HOSPITAL AND THEY ISED MEDICINE TO CONVERT ME , IT TOOK OVER 10 HOURS TO CONVERT ME. BUT AFTER THAT IVE NEVER HAD ANY ISSUES SINCE. I USED TO HAVE A CARDIOLOGUIST AS MY PRIMARY IN [place] BUT IHAVENT BEEN TO A CARDIOLOGIST SINCE I MOVED DOWN HERE.   Marland Kitchen Chicken pox as a child  . Depression   . Depression with anxiety 12/27/2012  . DJD (degenerative joint disease) 08/05/2012  . DVT (deep venous thrombosis) ([place]) 2018  . Fifth  disease 1989   DX AFTER EXPOSURE FROM HER CHILDREN; DEVELOPED RHEUMATOID ARTHRITIS DURING COURSE OF ILLNESS. REPORTS:  " I DONT HAVE IT ANYMORE SINCE THE DISEASE IS GONE"   . H/O gestational diabetes mellitus, not currently pregnant 08/05/2012   History of   . Heart murmur    echo 2012; "I HAVE A FUNCTIONAL MURMUR"   . Hyperlipidemia   . Hypothyroidism   . Knee effusion, right    while on Eliquis, discontinued Eliquis after DVT resolved  . Measles as a child  . Other and unspecified hyperlipidemia 08/05/2012  . Paroxysmal A-fib ([place])    . Shingles 43  . Spondylisthesis 2012   L5/S1 grade 3  . Varicose veins 08/05/2012   Extending up to right groin B/l LE  . Ventral hernia 08/05/2012  . Wears glasses      Past Surgical History:  Procedure Laterality Date  . BREAST DUCTAL SYSTEM EXCISION Right 05/05/2014   Procedure: RIGHT BREAST DUCT EXCISION;  Surgeon: Rolm Bookbinder, MD;  Location: [facility];  Service: General;  Laterality: Right;  . COLONOSCOPY    . DILATION AND CURETTAGE OF UTERUS     x4  . TOTAL KNEE ARTHROPLASTY Right 07/01/2017   Procedure: RIGHT TOTAL KNEE ARTHROPLASTY;  Surgeon: Paralee Cancel, MD;  [facility];  Service: Orthopedics;  Laterality: Right;  70 mins  . [place]   right leg   Family History  Problem Relation Age of Onset  . Cancer Mother        lung- smoker  . Liver disease Mother   . Cancer Father        lung- smoker  . Peripheral Artery Disease Sister   . Dementia Maternal Grandmother   . Heart attack Maternal Grandfather   . Asthma Son   . Ulcerative colitis Son   . Asthma Son   . Ulcerative colitis Son   . Heart attack Paternal Uncle    Social History   Tobacco Use  . Smoking status: Former Smoker    Years: 4.00    Types: Cigarettes  . Smokeless tobacco: Never Used  Vaping Use  . Vaping Use: Never used  Substance Use Topics  . Alcohol use: No  . Drug use: No   Current Outpatient Medications  Medication Sig Dispense Refill  . aspirin EC 81 MG tablet Take 81 mg by mouth daily. Swallow whole.    Marland Kitchen atorvastatin (LIPITOR) 10 MG tablet Take one-half tablet by mouth once daily 45 tablet 0  . Cholecalciferol (VITAMIN D) 125 MCG (5000 UT) CAPS Take 5,000 Units by mouth daily.    Marland Kitchen Co-Enzyme Q-10 100 MG CAPS Take 300 mg by mouth daily.    Marland Kitchen levothyroxine (SYNTHROID) 100 MCG tablet Take 1 tablet (100 mcg total) by mouth daily before breakfast. 90 tablet 1  . polyethylene glycol (MIRALAX / GLYCOLAX) 17 g packet 17 g. Patient takes 2 doses  by mouth once daily     No current facility-administered medications for this visit.   Allergies  Allergen Reactions  . Crestor [Rosuvastatin] Other (See Comments)    Joint pain w/fibromyalgia  . Pravachol [Pravastatin] Other (See Comments)    Joint pain w/fibromyalgia  . Penicillins Swelling, Rash and Other (See Comments)    Hands and feet swell, rash; childhood allergy  Has patient had a PCN reaction causing immediate rash, facial/tongue/throat swelling, SOB or lightheadedness with hypotension: Unknown Has patient had a PCN reaction causing severe rash involving mucus membranes  or skin necrosis: Unknown Has patient had a PCN reaction that required hospitalization: Yes Has patient had a PCN reaction occurring within the last 10 years: No If all of the above answers are "NO", then may proceed with Cephalosporin use.      Review of Systems: All systems reviewed and negative except where noted in HPI.   Lab Results  Component Value Date   WBC 4.2 03/16/2020   HGB 13.0 03/16/2020   HCT 39.0 03/16/2020   MCV 86.4 03/16/2020   PLT 256.0 03/16/2020    Lab Results  Component Value Date   CREATININE 0.65 03/16/2020   BUN 17 03/16/2020   NA 137 03/16/2020   K 4.9 03/16/2020   CL 100 03/16/2020   CO2 33 (H) 03/16/2020    Lab Results  Component Value Date   ALT 15 03/16/2020   AST 21 03/16/2020   ALKPHOS 77 03/16/2020   BILITOT 1.0 03/16/2020     Physical Exam: BP 104/68 (BP Location: Left Arm, Patient Position: Sitting, Cuff Size: Normal)   Pulse (!) 56   Ht 5\' 3"  (1.6 m) Comment: height measured without shoes  Wt 133 lb (60.3 kg) Comment: pt refused to wieigh and stated weight  BMI 23.56 kg/m  Constitutional: Pleasant,well-developed, female in no acute distress. Cardiovascular: Normal rate, regular rhythm.  Pulmonary/chest: Effort normal and breath sounds normal. No wheezing, rales or rhonchi. Abdominal: Soft, nondistended, nontender.There are no masses palpable.   Neurological: Alert and oriented to person place and time. Skin: Skin is warm and dry. No rashes noted. Psychiatric: Normal mood and affect. Behavior is normal.   ASSESSMENT AND PLAN: 70 year old female here to reestablish care for the following issues:  Diverticulitis Constipation  First-time episode of diverticulitis which was a prolonged course last year requiring 2 courses of antibiotics.  She has recovered from this and generally has done well, had some recent mild discomfort in her left abdomen which resolved.  She remains on MiraLAX twice daily which is working well for her constipation.  We discussed diverticulitis in general, risks for recurrence.  We discussed dietary changes.  It sounds like she is generally doing well at this time.  I offered her some Bentyl to use as needed if she has occasional recurrent cramps but she declines that for now.  She is due for routine screening colonoscopy and in light of the diverticulitis she warrants this to follow that up as well to ensure no underlying polyp or mass lesion.  We discussed timing of when she wants to do this, her preference is to see her cardiologist first for atrial fibrillation evaluation to make sure that stable prior to proceeding with anesthesia which is reasonable, she is due to see them next month.  She will call to schedule her colonoscopy when she is ready.  We discussed risk benefits of colonoscopy and anesthesia and she understands.  If she has any recurrent symptoms of diverticulitis in the interim she will contact us. Otherwise continue Miralax in the interim for constipation.  Manning Cellar, MD [facility]    Mosie Lukes, MD

## 2020-06-12 DIAGNOSIS — L219 Seborrheic dermatitis, unspecified: Secondary | ICD-10-CM | POA: Diagnosis not present

## 2020-06-12 DIAGNOSIS — L719 Rosacea, unspecified: Secondary | ICD-10-CM | POA: Diagnosis not present

## 2020-06-12 DIAGNOSIS — L570 Actinic keratosis: Secondary | ICD-10-CM | POA: Diagnosis not present

## 2020-07-27 NOTE — Progress Notes (Signed)
Cardiology Office Note:    Date:  07/28/2020   ID:  Crystal Russell, DOB [birth year], MRN [id number]  PCP:  Mosie Lukes, MD  [facility] Cardiologist: Rudean Haskell MD [place] Electrophysiologist:  None   CC: AF f/u  History of Present Illness:    Crystal Russell is a 70 y.o. female with a hx of PAF in [place], Mild Atrial Functional MR, Mild to moderate AI without LV dilation, PFO, possible distant DVT possible history of rheumatic and HLD who presents for evaluation 05/01/20.  In interim of this visit, patient heart monitor with no AF burden.  Seen 07/28/20.  Patient notes that she is doing good.  Since last visit notes no changes.  Relevant interval testing or therapy include heart monitor.  There are no interval hospital/ED visit.    No chest pain or pressure.  No SOB/DOE and no PND/Orthopnea.  No weight gain or leg swelling.  Notes rare palpitations and no syncope.  No headaches.  Walking and doing well.   Past Medical History:  Diagnosis Date  . Abdominal wall hernia 08/04/2012  . Atrial fibrillation ([place]) 2008   REPORTS SHE WAS AT [place], UPON WAKING I FELT MY HEART GOING BOOM BOOM BOOM BEATING OUT OF MY CHEST. I WENT TO HOSPITAL AND THEY ISED MEDICINE TO CONVERT ME , IT TOOK OVER 10 HOURS TO CONVERT ME. BUT AFTER THAT IVE NEVER HAD ANY ISSUES SINCE. I USED TO HAVE A CARDIOLOGUIST AS MY PRIMARY IN [place] BUT IHAVENT BEEN TO A CARDIOLOGIST SINCE I MOVED DOWN HERE.   Marland Kitchen Chicken pox as a child  . Depression   . Depression with anxiety 12/27/2012  . DJD (degenerative joint disease) 08/05/2012  . DVT (deep venous thrombosis) ([place]) 2018  . Fifth disease 1989   DX AFTER EXPOSURE FROM HER CHILDREN; DEVELOPED RHEUMATOID ARTHRITIS DURING COURSE OF ILLNESS. REPORTS:  " I DONT HAVE IT ANYMORE SINCE THE DISEASE IS GONE"   . H/O gestational diabetes mellitus, not currently pregnant 08/05/2012   History of   . Heart murmur    echo 2012; "I HAVE A FUNCTIONAL MURMUR"   . Hyperlipidemia    . Hypothyroidism   . Knee effusion, right    while on Eliquis, discontinued Eliquis after DVT resolved  . Measles as a child  . Other and unspecified hyperlipidemia 08/05/2012  . Paroxysmal A-fib ([place])   . Shingles 43  . Spondylisthesis 2012   L5/S1 grade 3  . Varicose veins 08/05/2012   Extending up to right groin B/l LE  . Ventral hernia 08/05/2012  . Wears glasses     Past Surgical History:  Procedure Laterality Date  . BREAST DUCTAL SYSTEM EXCISION Right 05/05/2014   Procedure: RIGHT BREAST DUCT EXCISION;  Surgeon: Rolm Bookbinder, MD;  Location: [facility];  Service: General;  Laterality: Right;  . COLONOSCOPY    . DILATION AND CURETTAGE OF UTERUS     x4  . TOTAL KNEE ARTHROPLASTY Right 07/01/2017   Procedure: RIGHT TOTAL KNEE ARTHROPLASTY;  Surgeon: Paralee Cancel, MD;  Location[facility];  Service: Orthopedics;  Laterality: Right;  70 mins  . [place]   right leg    Current Medications: Current Meds  Medication Sig  . aspirin EC 81 MG tablet Take 81 mg by mouth daily. Swallow whole.  Marland Kitchen atorvastatin (LIPITOR) 10 MG tablet Take one-half tablet by mouth once daily  . Cholecalciferol (VITAMIN D) 125 MCG (5000 UT) CAPS Take 5,000 Units  by mouth daily.  Marland Kitchen Co-Enzyme Q-10 100 MG CAPS Take 300 mg by mouth daily.  Marland Kitchen levothyroxine (SYNTHROID) 100 MCG tablet Take 1 tablet (100 mcg total) by mouth daily before breakfast.  . polyethylene glycol (MIRALAX / GLYCOLAX) 17 g packet Take 17 g by mouth 2 (two) times daily.     Allergies:   Crestor [rosuvastatin], Pravachol [pravastatin], and Penicillins   Social History   Socioeconomic History  . Marital status: Married    Spouse name: Not on file  . Number of children: Not on file  . Years of education: Not on file  . Highest education level: Not on file  Occupational History  . Not on file  Tobacco Use  . Smoking status: Former Smoker    Years: 4.00    Types: Cigarettes  . Smokeless tobacco:  Never Used  Vaping Use  . Vaping Use: Never used  Substance and Sexual Activity  . Alcohol use: No  . Drug use: No  . Sexual activity: Yes  Other Topics Concern  . Not on file  Social History Narrative  . Not on file   Social Determinants of Health   Financial Resource Strain: Not on file  Food Insecurity: Not on file  Transportation Needs: Not on file  Physical Activity: Not on file  Stress: Not on file  Social Connections: Not on file    SOCIAL: Son has UC; one son [name] (born on earth day) was very sick with Erick, one son with two kids.  Doesn't do well with percentages in discussion  Family History: The patient's family history includes Asthma in her son and son; Cancer in her father and mother; Dementia in her maternal grandmother; Heart attack in her maternal grandfather and paternal uncle; Liver disease in her mother; Peripheral Artery Disease in her sister; Ulcerative colitis in her son and son. History of coronary artery disease notable for grandfather. History of heart failure notable for no members. History of arrhythmia notable for no members. No history of bicuspid aortic valve or aortic aneurysm or dissection.  ROS:   Please see the history of present illness.    All other systems reviewed and are negative.  EKGs/Labs/Other Studies Reviewed:    The following studies were reviewed today:  EKG:   05/01/2020: sinus Bradycardia rate 55 WNL 06/24/2017: Sinus Bradycardia Rate 51  Cardiac Event Monitoring: Date: 06/07/20 Results:  Patient had a minimum heart rate of 39 bpm, maximum heart rate of 180 bpm, and average heart rate of 53 bpm.  Predominant underlying rhythm was sinus rhythm.  Two runs of nonsustained ventricular tachycardia occurred lasting 6 beats at longest with a max rate of 180 bpm at fastest.  Isolated PACs were rare (<1.0%).  Isolated PVCs were rare (<1.0%).  No evidence of atrial fibrillation.  Triggered and diary events associated  with sinus rhythm, sinus bradycardia, and PACs.   Rare, symptomatic PACs.   Transthoracic Echocardiogram: Date: 04/06/2020 Results: Personally read and reported 1. Left ventricular ejection fraction, by estimation, is 55 to 60%. The  left ventricle has normal function. The left ventricle has no regional  wall motion abnormalities. Left ventricular diastolic parameters are  indeterminate.  2. Right ventricular systolic function is normal. The right ventricular  size is normal. Mildly increased right ventricular wall thickness. There  is normal pulmonary artery systolic pressure.  3. Left atrial size was moderate to severely dilated.  4. The mitral valve is grossly normal. Mild mitral valve regurgitation.  5. The aortic valve  is tricuspid. Aortic valve regurgitation is mild to  moderate. No aortic stenosis is present.  6. The inferior vena cava is dilated in size with >50% respiratory  variability, suggesting right atrial pressure of 8 mmHg.  Report from 2021 noted mild AI and MR, small PFO with L-R shunt  Normal Carotid Artery Duplex- per patient  Recent Labs: 03/16/2020: ALT 15; BUN 17; Creatinine, Ser 0.65; Hemoglobin 13.0; Platelets 256.0; Potassium 4.9; Sodium 137; TSH 1.74  Recent Lipid Panel    Component Value Date/Time   CHOL 182 03/16/2020 0913   TRIG 58.0 03/16/2020 0913   HDL 63.00 03/16/2020 0913   CHOLHDL 3 03/16/2020 0913   VLDL 11.6 03/16/2020 0913   LDLCALC 108 (H) 03/16/2020 0913    Risk Assessment/Calculations:    CHA2DS2-VASc Score = 2  This indicates a 2.2% annual risk of stroke. The patient's score is based upon: CHF History: No HTN History: No Diabetes History: No Stroke History: No Vascular Disease History: No Age Score: 1 Gender Score: 1   The 10-year ASCVD risk score Mikey Bussing DC Jr., et al., 2013) is: 5.8%   Values used to calculate the score:     Age: 20 years     Sex: Female     Is Non-Hispanic African American: No     Diabetic: No      Tobacco smoker: No     Systolic Blood Pressure: 893 mmHg     Is BP treated: No     HDL Cholesterol: 63 mg/dL     Total Cholesterol: 182 mg/dL    Physical Exam:    VS:  BP (!) 108/58   Pulse (!) 51   Ht 5\' 3"  (1.6 m)   Wt 133 lb (60.3 kg)   SpO2 99%   BMI 23.56 kg/m     Wt Readings from Last 3 Encounters:  07/28/20 133 lb (60.3 kg)  06/07/20 133 lb (60.3 kg)  05/01/20 133 lb (60.3 kg)   GEN:  Well nourished, well developed in no acute distress HEENT: Normal NECK: No JVD LYMPHATICS: No lymphadenopathy CARDIAC: Bradycardic rate and rhythm, Soft I/VI diastolic murmur, no rubs, gallops +2 radial pulses  RESPIRATORY:  Clear to auscultation without rales, wheezing or rhonchi  ABDOMEN: Soft, non-tender, non-distended MUSCULOSKELETAL:  No edema; No deformity  SKIN: Warm and dry NEUROLOGIC:  Alert and oriented x 3 PSYCHIATRIC:  Normal affect   ASSESSMENT:    No diagnosis found. PLAN:    In order of problems listed above:  Paroxysmal Atrial Fibrillation PAC Hypothyroidism on synthroid - Risk factors include gender and femal - CHADSVASC=2. - TSH 1.74, imaging notable for moderate to severe  left atrial dilation by volume - discussed alcohol and exercise as preventive factors - OSA Risk is low - Continue aspirin   Mild to Moderate Aortic Insufficiency Mid Mitral Regurgitation PFO - asymptomatic - MR is atrial functional and will push [place] to a more aggressive rhythm control strategy - One year follow up echocardiogram (12/22)  Hyperlipidemia (mixed) -LDL goal less than 100; at 108 (last) - patient has chosen to take CoQ 110 -will do trial of atorvastatin 10 mg; lipids in 3 months (lipids and LFTs) - gave education on dietary changes  03/2021 follow up unless new symptoms or abnormal test results warranting change in plan  Would be reasonable for  APP Follow up        Medication Adjustments/Labs and Tests Ordered: Current medicines are reviewed at length  with the patient today.  Concerns  regarding medicines are outlined above.  No orders of the defined types were placed in this encounter.  No orders of the defined types were placed in this encounter.   There are no Patient Instructions on file for this visit.   Signed, Werner Lean, MD  07/28/2020 8:47 AM    [place]

## 2020-07-28 ENCOUNTER — Ambulatory Visit (INDEPENDENT_AMBULATORY_CARE_PROVIDER_SITE_OTHER): Payer: Medicare Other | Admitting: Internal Medicine

## 2020-07-28 ENCOUNTER — Other Ambulatory Visit: Payer: Self-pay

## 2020-07-28 ENCOUNTER — Encounter: Payer: Self-pay | Admitting: Internal Medicine

## 2020-07-28 VITALS — BP 108/58 | HR 51 | Ht 63.0 in | Wt 133.0 lb

## 2020-07-28 DIAGNOSIS — I480 Paroxysmal atrial fibrillation: Secondary | ICD-10-CM | POA: Diagnosis not present

## 2020-07-28 DIAGNOSIS — I340 Nonrheumatic mitral (valve) insufficiency: Secondary | ICD-10-CM | POA: Diagnosis not present

## 2020-07-28 DIAGNOSIS — E782 Mixed hyperlipidemia: Secondary | ICD-10-CM

## 2020-07-28 DIAGNOSIS — I351 Nonrheumatic aortic (valve) insufficiency: Secondary | ICD-10-CM | POA: Diagnosis not present

## 2020-07-28 DIAGNOSIS — I491 Atrial premature depolarization: Secondary | ICD-10-CM | POA: Diagnosis not present

## 2020-07-28 DIAGNOSIS — Q2112 Patent foramen ovale: Secondary | ICD-10-CM

## 2020-07-28 DIAGNOSIS — Q211 Atrial septal defect: Secondary | ICD-10-CM | POA: Diagnosis not present

## 2020-07-28 MED ORDER — ATORVASTATIN CALCIUM 10 MG PO TABS: 10.0000 mg | ORAL_TABLET | Freq: Every day | ORAL | 3 refills | Status: DC

## 2020-07-28 NOTE — Patient Instructions (Signed)
Medication Instructions:  Your physician has recommended you make the following change in your medication:  INCREASE: atorvastatin to 10 mg by mouth daily  *If you need a refill on your cardiac medications before your next appointment, please call your pharmacy*   Lab Work: IN 3 MONTHS: liver function test and fasting lipid panel If you have labs (blood work) drawn today and your tests are completely normal, you will receive your results only by: Marland Kitchen MyChart Message (if you have MyChart) OR . A paper copy in the mail If you have any lab test that is abnormal or we need to change your treatment, we will call you to review the results.   Testing/Procedures: Your physician has requested that you have an echocardiogram in [place]. Echocardiography is a painless test that uses sound waves to create images of your heart. It provides your doctor with information about the size and shape of your heart and how well your heart's chambers and valves are working. This procedure takes approximately one hour. There are no restrictions for this procedure.     Follow-Up: At [facility], you and your health needs are our priority.  As part of our continuing mission to provide you with exceptional heart care, we have created designated Provider Care Teams.  These Care Teams include your primary Cardiologist (physician) and Advanced Practice Providers (APPs -  Physician Assistants and Nurse Practitioners) who all work together to provide you with the care you need, when you need it.  We recommend signing up for the patient portal called "MyChart".  Sign up information is provided on this After Visit Summary.  MyChart is used to connect with patients for Virtual Visits (Telemedicine).  Patients are able to view lab/test results, encounter notes, upcoming appointments, etc.  Non-urgent messages can be sent to your provider as well.   To learn more about what you can do with MyChart, go to  [email or web address].    Your next appointment:   8 month(s)  The format for your next appointment:   In Person  Provider:   You may see Rudean Haskell, MD or one of the following Advanced Practice Providers on your designated Care Team:    Melina Copa, PA-C  Ermalinda Barrios, PA-C

## 2020-07-28 NOTE — Addendum Note (Signed)
Addended by: Precious Gilding on: 07/28/2020 09:37 AM   Modules accepted: Orders

## 2020-08-02 ENCOUNTER — Other Ambulatory Visit: Payer: Self-pay

## 2020-08-02 ENCOUNTER — Inpatient Hospital Stay: Admission: RE | Admit: 2020-08-02 | Payer: Medicare Other | Source: Ambulatory Visit

## 2020-08-02 ENCOUNTER — Ambulatory Visit
Admission: RE | Admit: 2020-08-02 | Discharge: 2020-08-02 | Disposition: A | Discharge status: Home / Self Care | Payer: Medicare Other | Source: Ambulatory Visit | Attending: Family Medicine | Admitting: Family Medicine

## 2020-08-02 DIAGNOSIS — Z1231 Encounter for screening mammogram for malignant neoplasm of breast: Secondary | ICD-10-CM | POA: Diagnosis not present

## 2020-08-02 DIAGNOSIS — Z9189 Other specified personal risk factors, not elsewhere classified: Secondary | ICD-10-CM

## 2020-08-02 DIAGNOSIS — Z1239 Encounter for other screening for malignant neoplasm of breast: Secondary | ICD-10-CM

## 2020-08-02 DIAGNOSIS — N6320 Unspecified lump in the left breast, unspecified quadrant: Secondary | ICD-10-CM

## 2020-08-02 NOTE — Addendum Note (Signed)
Addended by: Precious Gilding on: 08/02/2020 02:28 PM   Modules accepted: Orders

## 2020-09-06 DIAGNOSIS — M25561 Pain in right knee: Secondary | ICD-10-CM | POA: Diagnosis not present

## 2020-09-07 DIAGNOSIS — M25561 Pain in right knee: Secondary | ICD-10-CM | POA: Diagnosis not present

## 2020-09-08 DIAGNOSIS — Z96651 Presence of right artificial knee joint: Secondary | ICD-10-CM | POA: Diagnosis not present

## 2020-09-08 DIAGNOSIS — M25561 Pain in right knee: Secondary | ICD-10-CM | POA: Diagnosis not present

## 2020-09-08 DIAGNOSIS — T8484XA Pain due to internal orthopedic prosthetic devices, implants and grafts, initial encounter: Secondary | ICD-10-CM | POA: Diagnosis not present

## 2020-09-10 ENCOUNTER — Encounter: Payer: Self-pay | Admitting: Family Medicine

## 2020-09-10 ENCOUNTER — Telehealth: Payer: Self-pay | Admitting: Internal Medicine

## 2020-09-10 ENCOUNTER — Telehealth: Payer: Self-pay | Admitting: Gastroenterology

## 2020-09-10 MED ORDER — CIPROFLOXACIN HCL 500 MG PO TABS: 500.0000 mg | ORAL_TABLET | Freq: Two times a day (BID) | ORAL | 0 refills | Status: DC

## 2020-09-10 MED ORDER — METRONIDAZOLE 250 MG PO TABS: 500.0000 mg | ORAL_TABLET | Freq: Three times a day (TID) | ORAL | 0 refills | Status: DC

## 2020-09-10 NOTE — Telephone Encounter (Signed)
GI ON CALL NOTE  This patient called earlier today with what she thought was a flare of her diverticulitis.  Spoke with the GI physician assistant who called in ciprofloxacin and metronidazole (she has had these before, she says).  However, the pharmacy that was designated by the patient had subsequently closed.  She calls again requesting prescriptions to an alternative pharmacy--  [facility].  She does tell me that she feels this is similar to previous problems with diverticulitis.  We will prescribe ciprofloxacin 500 mg twice daily for 1 week as well as metronidazole 500 mg twice daily for 1 week.  No refills.  She was instructed to contact the office in the morning after 8 AM if she has any further problems or questions.  I will forward this to her primary gastroenterologist, Dr. Havery Moros.

## 2020-09-10 NOTE — Telephone Encounter (Signed)
Patient called with complaints of LLQ/pelvic pain.  Similar to previous episode of diverticulitis last year.  She is having fevers as well but is awaiting results from fluid aspiration from her knee, which if infected may need washed out tomorrow per her report.  Will prescribe cipro 500 mg BID and flagyl 500 mg TID for 7 days.  Prescriptions sent to pharmacy.  Also advised liquid diet today and then if feels like eating tomorrow then soft/bland diet.

## 2020-09-11 ENCOUNTER — Encounter (HOSPITAL_COMMUNITY): Payer: Self-pay | Admitting: Orthopedic Surgery

## 2020-09-11 ENCOUNTER — Other Ambulatory Visit: Payer: Self-pay

## 2020-09-11 NOTE — Progress Notes (Signed)
COVID Vaccine Completed: Yes Date COVID Vaccine completed:06/04/19 Has received booster:01/24/20 COVID vaccine manufacturer: [employer]      Date of COVID positive in last 90 days: No  PCP - Mosie Lukes, MD Cardiologist - Rudean Haskell MD last office visit 07/28/20 in epic  Chest x-ray - N/A EKG - 05/01/20 in epic Stress Test - greater 2 years in [place] ECHO - 04/06/20  in epic Cardiac Cath - N/A Pacemaker/ICD device last checked: N/A  Sleep Study - N/A CPAP - N/A  Fasting Blood Sugar - N/A Checks Blood Sugar _N/A____ times a day  Blood Thinner Instructions: N/A Aspirin Instructions:N/A Last Dose:N/A  Activity level:  Can go up a flight of stairs and activities of daily living without stopping and without symptoms    Anesthesia review: Atrial fibrillation, DVT  Patient denies shortness of breath, fever, cough and chest pain at PAT appointment   Patient verbalized understanding of instructions that were given to them at the PAT appointment. Patient was also instructed that they will need to review over the PAT instructions again at home before surgery.

## 2020-09-11 NOTE — Telephone Encounter (Signed)
Thanks John.  [place] can you follow up with this patient in a few days to see how she is doing? She was supposed to schedule a colonoscopy with me as she has not had an exam in a long time but multiple episodes of diverticulitis since then. Can you schedule her for an exam in 6-8 weeks or so if she is willing and not already scheduled? Thanks

## 2020-09-12 ENCOUNTER — Other Ambulatory Visit (HOSPITAL_COMMUNITY)
Admission: RE | Admit: 2020-09-12 | Discharge: 2020-09-12 | Disposition: A | Discharge status: Home / Self Care | Payer: Medicare Other | Source: Ambulatory Visit | Attending: Orthopedic Surgery | Admitting: Orthopedic Surgery

## 2020-09-12 ENCOUNTER — Other Ambulatory Visit: Payer: Self-pay

## 2020-09-12 DIAGNOSIS — Z01812 Encounter for preprocedural laboratory examination: Secondary | ICD-10-CM | POA: Insufficient documentation

## 2020-09-12 DIAGNOSIS — Z20822 Contact with and (suspected) exposure to covid-19: Secondary | ICD-10-CM | POA: Insufficient documentation

## 2020-09-12 LAB — SARS CORONAVIRUS 2 (TAT 6-24 HRS): SARS Coronavirus 2: NEGATIVE

## 2020-09-12 MED ORDER — ATORVASTATIN CALCIUM 10 MG PO TABS: 5.0000 mg | ORAL_TABLET | Freq: Every day | ORAL | 3 refills | Status: DC

## 2020-09-12 NOTE — Progress Notes (Signed)
Per patient she is only taking atorvastatin 5 mg d/t side effects while taking 10 mg.  Med list updated.

## 2020-09-12 NOTE — Progress Notes (Signed)
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 former smoker with h/o PAF, PFO, infected right total knee arthroplasty scheduled for above procedure 09/14/2020 with Dr. Paralee Cancel.   Pt last seen by cardiology 07/28/2020. Stable at this visit.   VS: Ht 5' 4.5" (1.638 m)   Wt 58.5 kg   BMI 21.80 kg/m   PROVIDERS: Mosie Lukes, MD is PCP   Rudean Haskell, MD is Cardiologist  LABS: SDW, labs DOS (all labs ordered are listed, but only abnormal results are displayed)  Labs Reviewed - No data to display   IMAGES:   EKG: 05/01/2020 Rate 55 bpm  Sinus bradycardia  CV: Echo 04/06/2020 1. Left ventricular ejection fraction, by estimation, is 55 to 60%. The  left ventricle has normal function. The left ventricle has no regional  wall motion abnormalities. Left ventricular diastolic parameters are  indeterminate.  2. Right ventricular systolic function is normal. The right ventricular  size is normal. Mildly increased right ventricular wall thickness. There  is normal pulmonary artery systolic pressure.  3. Left atrial size was moderate to severely dilated.  4. The mitral valve is grossly normal. Mild mitral valve regurgitation.  5. The aortic valve is tricuspid. Aortic valve regurgitation is mild to  moderate. No aortic stenosis is present.  6. The inferior vena cava is dilated in size with >50% respiratory  variability, suggesting right atrial pressure of 8 mmHg.  Past Medical History:  Diagnosis Date  . Abdominal wall hernia 08/04/2012  . Atrial fibrillation ([place]) 2008   REPORTS SHE WAS AT [place], UPON WAKING I FELT  MY HEART GOING BOOM BOOM BOOM BEATING OUT OF MY CHEST. I WENT TO HOSPITAL AND THEY ISED MEDICINE TO CONVERT ME , IT TOOK OVER 10 HOURS TO CONVERT ME. BUT AFTER THAT IVE NEVER HAD ANY ISSUES SINCE. I USED TO HAVE A CARDIOLOGUIST AS MY PRIMARY IN [place] BUT IHAVENT BEEN TO A CARDIOLOGIST SINCE I MOVED DOWN HERE.   Marland Kitchen Chicken pox as a child  . Depression   . Depression with anxiety 12/27/2012  . Diverticulitis   . DJD (degenerative joint disease) 08/05/2012  . DVT (deep venous thrombosis) ([place]) 2018  . Fifth disease 1989   DX AFTER EXPOSURE FROM HER CHILDREN; DEVELOPED RHEUMATOID ARTHRITIS DURING COURSE OF ILLNESS. REPORTS:  " I DONT HAVE IT ANYMORE SINCE THE DISEASE IS GONE"   . H/O gestational diabetes mellitus, not currently pregnant 08/05/2012   History of   . Heart murmur    echo 2012; "I HAVE A FUNCTIONAL MURMUR"   . Hyperlipidemia   . Hypothyroidism   . Knee effusion, right    while on Eliquis, discontinued Eliquis after DVT resolved  . Measles as a child  . Migraines   . Other and unspecified hyperlipidemia 08/05/2012  . Paroxysmal A-fib ([place])   . Shingles 70  . Spondylisthesis 2012   L5/S1 grade 3  . Varicose veins 08/05/2012   Extending up to right groin B/l LE  . Ventral hernia 08/05/2012  . Wears glasses     Past Surgical History:  Procedure Laterality Date  . BREAST DUCTAL SYSTEM EXCISION Right  05/05/2014   Procedure: RIGHT BREAST DUCT EXCISION;  Surgeon: Rolm Bookbinder, MD;  Location: [facility];  Service: General;  Laterality: Right;  . COLONOSCOPY    . DILATION AND CURETTAGE OF UTERUS     x4  . MOUTH SURGERY    . TOTAL KNEE ARTHROPLASTY Right 07/01/2017   Procedure: RIGHT TOTAL KNEE ARTHROPLASTY;  Surgeon: Paralee Cancel, MD;  [facility];  Service: Orthopedics;  Laterality: Right;  70 mins  . VARICOSE VEIN SURGERY  1995   right leg    MEDICATIONS: No current facility-administered medications for this encounter.   Marland Kitchen acetaminophen (TYLENOL)  500 MG tablet  . atorvastatin (LIPITOR) 10 MG tablet  . Cholecalciferol (PA VITAMIN D-3) 50 MCG (2000 UT) CAPS  . ciprofloxacin (CIPRO) 500 MG tablet  . Coenzyme Q10 300 MG CAPS  . levothyroxine (SYNTHROID) 100 MCG tablet  . metroNIDAZOLE (FLAGYL) 500 MG tablet  . vitamin B-12 (CYANOCOBALAMIN) 1000 MCG tablet  . aspirin EC 81 MG tablet  . polyethylene glycol (MIRALAX / GLYCOLAX) 17 g packet     Konrad Felix, PA-C [facility] [phone]

## 2020-09-13 NOTE — Telephone Encounter (Signed)
Okay thanks [employer].  Sounds like she has a severe infection in her leg and should address that first.  When she is recovered she can call us back to schedule her colonoscopy.  Thanks

## 2020-09-13 NOTE — Telephone Encounter (Signed)
Spoke with patient, she states that she is feeling better today. Denies any pain. She reports a "bubbly and gassy gut." She states that she checked her temperature last night and it was 99.5. Patient states that she has also developed an infection in her replacement knee and they are performing surgery tomorrow. Pt states that she is not sure how invasive the treatment is but she does know that she will be on IV antibiotics for a while, pt will have a PICC line placed. Patient states that she is not sure if she will be cleared in 8 weeks for a colonoscopy. Advised that it would be best to get her on the books because appts are going fast and then she could call us back if she is still not well. Patient states that she knows that she needs the colonoscopy but is not sure that's the best thing right now. Advised that she can discuss timing with her surgeon and see what they recommend. Patient verbalized understanding of all information and had no concerns at the end of the call.

## 2020-09-13 NOTE — Telephone Encounter (Signed)
Patient has been notified of recommendations. Pt verbalized understanding and had no concerns at the end of the call.

## 2020-09-14 ENCOUNTER — Inpatient Hospital Stay: Payer: Self-pay

## 2020-09-14 ENCOUNTER — Inpatient Hospital Stay (HOSPITAL_COMMUNITY)
Admission: RE | Admit: 2020-09-14 | Discharge: 2020-09-16 | DRG: 464 | Disposition: A | Discharge status: Home Health | Payer: Medicare Other | Attending: Orthopedic Surgery | Admitting: Orthopedic Surgery

## 2020-09-14 ENCOUNTER — Encounter (HOSPITAL_COMMUNITY)
Admission: RE | Disposition: A | Discharge status: Home / Self Care | Payer: Self-pay | Source: Home / Self Care | Attending: Orthopedic Surgery

## 2020-09-14 ENCOUNTER — Inpatient Hospital Stay (HOSPITAL_COMMUNITY): Payer: Medicare Other | Admitting: Physician Assistant

## 2020-09-14 ENCOUNTER — Other Ambulatory Visit: Payer: Self-pay

## 2020-09-14 ENCOUNTER — Encounter (HOSPITAL_COMMUNITY): Payer: Self-pay | Admitting: Orthopedic Surgery

## 2020-09-14 DIAGNOSIS — Z20822 Contact with and (suspected) exposure to covid-19: Secondary | ICD-10-CM | POA: Diagnosis not present

## 2020-09-14 DIAGNOSIS — Q211 Atrial septal defect: Secondary | ICD-10-CM | POA: Diagnosis not present

## 2020-09-14 DIAGNOSIS — E039 Hypothyroidism, unspecified: Secondary | ICD-10-CM | POA: Diagnosis not present

## 2020-09-14 DIAGNOSIS — Z79890 Hormone replacement therapy (postmenopausal): Secondary | ICD-10-CM | POA: Diagnosis not present

## 2020-09-14 DIAGNOSIS — T8453XA Infection and inflammatory reaction due to internal right knee prosthesis, initial encounter: Secondary | ICD-10-CM | POA: Diagnosis not present

## 2020-09-14 DIAGNOSIS — Z79899 Other long term (current) drug therapy: Secondary | ICD-10-CM

## 2020-09-14 DIAGNOSIS — Z888 Allergy status to other drugs, medicaments and biological substances status: Secondary | ICD-10-CM

## 2020-09-14 DIAGNOSIS — Y831 Surgical operation with implant of artificial internal device as the cause of abnormal reaction of the patient, or of later complication, without mention of misadventure at the time of the procedure: Secondary | ICD-10-CM | POA: Diagnosis present

## 2020-09-14 DIAGNOSIS — Z7982 Long term (current) use of aspirin: Secondary | ICD-10-CM

## 2020-09-14 DIAGNOSIS — Z880 Allergy status to penicillin: Secondary | ICD-10-CM

## 2020-09-14 DIAGNOSIS — F419 Anxiety disorder, unspecified: Secondary | ICD-10-CM | POA: Diagnosis not present

## 2020-09-14 DIAGNOSIS — Z86718 Personal history of other venous thrombosis and embolism: Secondary | ICD-10-CM

## 2020-09-14 DIAGNOSIS — Z87891 Personal history of nicotine dependence: Secondary | ICD-10-CM

## 2020-09-14 DIAGNOSIS — Z96651 Presence of right artificial knee joint: Secondary | ICD-10-CM | POA: Diagnosis not present

## 2020-09-14 DIAGNOSIS — K5909 Other constipation: Secondary | ICD-10-CM | POA: Diagnosis not present

## 2020-09-14 DIAGNOSIS — F32A Depression, unspecified: Secondary | ICD-10-CM | POA: Diagnosis not present

## 2020-09-14 DIAGNOSIS — E782 Mixed hyperlipidemia: Secondary | ICD-10-CM | POA: Diagnosis not present

## 2020-09-14 DIAGNOSIS — I480 Paroxysmal atrial fibrillation: Secondary | ICD-10-CM | POA: Diagnosis not present

## 2020-09-14 DIAGNOSIS — G8918 Other acute postprocedural pain: Secondary | ICD-10-CM | POA: Diagnosis not present

## 2020-09-14 DIAGNOSIS — B958 Unspecified staphylococcus as the cause of diseases classified elsewhere: Secondary | ICD-10-CM | POA: Diagnosis not present

## 2020-09-14 HISTORY — DX: Migraine, unspecified, not intractable, without status migrainosus: G43.909

## 2020-09-14 HISTORY — DX: Diverticulitis of intestine, part unspecified, without perforation or abscess without bleeding: K57.92

## 2020-09-14 HISTORY — PX: EXCISIONAL TOTAL KNEE ARTHROPLASTY WITH ANTIBIOTIC SPACERS: SHX5827

## 2020-09-14 LAB — BASIC METABOLIC PANEL
Anion gap: 8 (ref 5–15)
BUN: 7 mg/dL — ABNORMAL LOW (ref 8–23)
CO2: 27 mmol/L (ref 22–32)
Calcium: 9.5 mg/dL (ref 8.9–10.3)
Chloride: 99 mmol/L (ref 98–111)
Creatinine, Ser: 0.56 mg/dL (ref 0.44–1.00)
GFR, Estimated: >60 mL/min (ref >60)
Glucose, Bld: 101 mg/dL — ABNORMAL HIGH (ref 70–99)
Potassium: 3.8 mmol/L (ref 3.5–5.1)
Sodium: 134 mmol/L — ABNORMAL LOW (ref 135–145)

## 2020-09-14 LAB — CBC
HCT: 40.5 % (ref 36.0–46.0)
Hemoglobin: 13.4 g/dL (ref 12.0–15.0)
MCH: 28.9 pg (ref 26.0–34.0)
MCHC: 33.1 g/dL (ref 30.0–36.0)
MCV: 87.5 fL (ref 80.0–100.0)
Platelets: 323 10*3/uL (ref 150–400)
RBC: 4.63 MIL/uL (ref 3.87–5.11)
RDW: 11.9 % (ref 11.5–15.5)
WBC: 5.6 10*3/uL (ref 4.0–10.5)
nRBC: 0 % (ref 0.0–0.2)

## 2020-09-14 LAB — TYPE AND SCREEN
ABO/RH(D): O NEG
Antibody Screen: NEGATIVE

## 2020-09-14 LAB — SURGICAL PCR SCREEN
MRSA, PCR: NEGATIVE
Staphylococcus aureus: NEGATIVE

## 2020-09-14 SURGERY — REMOVAL, TOTAL ARTHROPLASTY HARDWARE, KNEE, WITH ANTIBIOTIC SPACER INSERTION
Anesthesia: Spinal | Site: Knee | Laterality: Right

## 2020-09-14 MED ORDER — ONDANSETRON HCL 4 MG/2ML IJ SOLN: 4.0000 mg | Freq: Four times a day (QID) | INTRAMUSCULAR | Status: DC | PRN

## 2020-09-14 MED ORDER — METHOCARBAMOL 500 MG IVPB - SIMPLE MED
500.0000 mg | Freq: Four times a day (QID) | INTRAVENOUS | Status: DC | PRN
  Filled 2020-09-14: qty 50

## 2020-09-14 MED ORDER — SODIUM CHLORIDE 0.9 % IV SOLN
2.0000 g | INTRAVENOUS | Status: DC
  Administered 2020-09-14: 2 g via INTRAVENOUS
  Filled 2020-09-14: qty 20
  Filled 2020-09-14: qty 2

## 2020-09-14 MED ORDER — BUPIVACAINE IN DEXTROSE 0.75-8.25 % IT SOLN
INTRATHECAL | Status: DC | PRN
  Administered 2020-09-14: 1.6 mL via INTRATHECAL

## 2020-09-14 MED ORDER — DEXAMETHASONE SODIUM PHOSPHATE 10 MG/ML IJ SOLN
10.0000 mg | Freq: Once | INTRAMUSCULAR | Status: DC
  Filled 2020-09-14: qty 1

## 2020-09-14 MED ORDER — FENTANYL CITRATE (PF) 100 MCG/2ML IJ SOLN: 50.0000 ug | INTRAMUSCULAR | Status: DC

## 2020-09-14 MED ORDER — ATORVASTATIN CALCIUM 10 MG PO TABS
5.0000 mg | ORAL_TABLET | Freq: Every evening | ORAL | Status: DC
  Filled 2020-09-14: qty 1

## 2020-09-14 MED ORDER — ACETAMINOPHEN 500 MG PO TABS: 1000.0000 mg | ORAL_TABLET | Freq: Once | ORAL | Status: DC

## 2020-09-14 MED ORDER — PROMETHAZINE HCL 25 MG/ML IJ SOLN: 6.2500 mg | INTRAMUSCULAR | Status: DC | PRN

## 2020-09-14 MED ORDER — SODIUM CHLORIDE 0.9 % IR SOLN
Status: DC | PRN
  Administered 2020-09-14: 3000 mL

## 2020-09-14 MED ORDER — ATORVASTATIN CALCIUM 10 MG PO TABS
5.0000 mg | ORAL_TABLET | Freq: Every day | ORAL | Status: DC
  Administered 2020-09-14 – 2020-09-15 (×2): 5 mg via ORAL
  Filled 2020-09-14 (×2): qty 1

## 2020-09-14 MED ORDER — ROPIVACAINE HCL 7.5 MG/ML IJ SOLN
INTRAMUSCULAR | Status: DC | PRN
  Administered 2020-09-14: 20 mL via PERINEURAL

## 2020-09-14 MED ORDER — HYDROMORPHONE HCL 1 MG/ML IJ SOLN
0.5000 mg | INTRAMUSCULAR | Status: DC | PRN
Start: 2020-09-14 — End: 2020-09-16

## 2020-09-14 MED ORDER — DEXAMETHASONE SODIUM PHOSPHATE 10 MG/ML IJ SOLN
8.0000 mg | Freq: Once | INTRAMUSCULAR | Status: AC
  Administered 2020-09-14: 8 mg via INTRAVENOUS

## 2020-09-14 MED ORDER — BISACODYL 10 MG RE SUPP: 10.0000 mg | Freq: Every day | RECTAL | Status: DC | PRN

## 2020-09-14 MED ORDER — PHENOL 1.4 % MT LIQD: 1.0000 | OROMUCOSAL | Status: DC | PRN

## 2020-09-14 MED ORDER — OXYCODONE HCL 5 MG PO TABS
ORAL_TABLET | ORAL | Status: AC
  Filled 2020-09-14: qty 1

## 2020-09-14 MED ORDER — DIPHENHYDRAMINE HCL 12.5 MG/5ML PO ELIX: 12.5000 mg | ORAL_SOLUTION | ORAL | Status: DC | PRN

## 2020-09-14 MED ORDER — POVIDONE-IODINE 10 % EX SWAB
2.0000 "application " | Freq: Once | CUTANEOUS | Status: AC
  Administered 2020-09-14: 2 via TOPICAL

## 2020-09-14 MED ORDER — MEPERIDINE HCL 50 MG/ML IJ SOLN: 6.2500 mg | INTRAMUSCULAR | Status: DC | PRN

## 2020-09-14 MED ORDER — FENTANYL CITRATE (PF) 100 MCG/2ML IJ SOLN
INTRAMUSCULAR | Status: AC
  Administered 2020-09-14: 50 ug via INTRAVENOUS
  Filled 2020-09-14: qty 2

## 2020-09-14 MED ORDER — VANCOMYCIN HCL 1000 MG/200ML IV SOLN
1000.0000 mg | INTRAVENOUS | Status: DC
  Administered 2020-09-14: 1000 mg via INTRAVENOUS
  Filled 2020-09-14: qty 200

## 2020-09-14 MED ORDER — FERROUS SULFATE 325 (65 FE) MG PO TABS
325.0000 mg | ORAL_TABLET | Freq: Three times a day (TID) | ORAL | Status: DC
  Administered 2020-09-16: 325 mg via ORAL
  Filled 2020-09-14 (×3): qty 1

## 2020-09-14 MED ORDER — ONDANSETRON HCL 4 MG PO TABS: 4.0000 mg | ORAL_TABLET | Freq: Four times a day (QID) | ORAL | Status: DC | PRN

## 2020-09-14 MED ORDER — METOCLOPRAMIDE HCL 5 MG PO TABS: 5.0000 mg | ORAL_TABLET | Freq: Three times a day (TID) | ORAL | Status: DC | PRN

## 2020-09-14 MED ORDER — VANCOMYCIN HCL 1000 MG IV SOLR
INTRAVENOUS | Status: DC | PRN
  Administered 2020-09-14: 1000 mg via TOPICAL

## 2020-09-14 MED ORDER — MIDAZOLAM HCL 2 MG/2ML IJ SOLN
INTRAMUSCULAR | Status: AC
  Administered 2020-09-14: 1 mg via INTRAVENOUS
  Filled 2020-09-14: qty 2

## 2020-09-14 MED ORDER — MENTHOL 3 MG MT LOZG: 1.0000 | LOZENGE | OROMUCOSAL | Status: DC | PRN

## 2020-09-14 MED ORDER — MIDAZOLAM HCL 2 MG/2ML IJ SOLN: 1.0000 mg | INTRAMUSCULAR | Status: DC

## 2020-09-14 MED ORDER — PROPOFOL 500 MG/50ML IV EMUL
INTRAVENOUS | Status: DC | PRN
  Administered 2020-09-14: 20 ug/kg/min via INTRAVENOUS

## 2020-09-14 MED ORDER — OXYCODONE HCL 5 MG PO TABS
5.0000 mg | ORAL_TABLET | Freq: Once | ORAL | Status: AC | PRN
Start: 2020-09-14 — End: 2020-09-14
  Administered 2020-09-14: 5 mg via ORAL

## 2020-09-14 MED ORDER — LACTATED RINGERS IV SOLN: INTRAVENOUS | Status: DC

## 2020-09-14 MED ORDER — TRANEXAMIC ACID-NACL 1000-0.7 MG/100ML-% IV SOLN
1000.0000 mg | Freq: Once | INTRAVENOUS | Status: AC
Start: 2020-09-14 — End: 2020-09-14
  Administered 2020-09-14: 1000 mg via INTRAVENOUS
  Filled 2020-09-14: qty 100

## 2020-09-14 MED ORDER — OXYCODONE HCL 5 MG/5ML PO SOLN: 5.0000 mg | Freq: Once | ORAL | Status: AC | PRN

## 2020-09-14 MED ORDER — METHOCARBAMOL 500 MG PO TABS
500.0000 mg | ORAL_TABLET | Freq: Four times a day (QID) | ORAL | Status: DC | PRN
  Administered 2020-09-15: 500 mg via ORAL
  Filled 2020-09-14 (×3): qty 1

## 2020-09-14 MED ORDER — EPHEDRINE SULFATE-NACL 50-0.9 MG/10ML-% IV SOSY
PREFILLED_SYRINGE | INTRAVENOUS | Status: DC | PRN
  Administered 2020-09-14: 5 mg via INTRAVENOUS
  Administered 2020-09-14: 10 mg via INTRAVENOUS

## 2020-09-14 MED ORDER — METRONIDAZOLE 500 MG PO TABS
500.0000 mg | ORAL_TABLET | Freq: Two times a day (BID) | ORAL | Status: DC
  Administered 2020-09-15: 500 mg via ORAL
  Filled 2020-09-14: qty 1

## 2020-09-14 MED ORDER — PHENYLEPHRINE HCL-NACL 10-0.9 MG/250ML-% IV SOLN
INTRAVENOUS | Status: DC | PRN
  Administered 2020-09-14: 30 ug/min via INTRAVENOUS

## 2020-09-14 MED ORDER — HYDROCODONE-ACETAMINOPHEN 5-325 MG PO TABS
1.0000 | ORAL_TABLET | ORAL | Status: DC | PRN
  Administered 2020-09-14 – 2020-09-16 (×9): 1 via ORAL
  Filled 2020-09-14 (×9): qty 1

## 2020-09-14 MED ORDER — CEFAZOLIN SODIUM-DEXTROSE 2-3 GM-%(50ML) IV SOLR
INTRAVENOUS | Status: DC | PRN
  Administered 2020-09-14: 2 g via INTRAVENOUS

## 2020-09-14 MED ORDER — METOCLOPRAMIDE HCL 5 MG/ML IJ SOLN: 5.0000 mg | Freq: Three times a day (TID) | INTRAMUSCULAR | Status: DC | PRN

## 2020-09-14 MED ORDER — LEVOTHYROXINE SODIUM 100 MCG PO TABS
100.0000 ug | ORAL_TABLET | Freq: Every day | ORAL | Status: DC
  Administered 2020-09-15: 100 ug via ORAL

## 2020-09-14 MED ORDER — CEFAZOLIN SODIUM-DEXTROSE 2-4 GM/100ML-% IV SOLN
INTRAVENOUS | Status: AC
  Filled 2020-09-14: qty 100

## 2020-09-14 MED ORDER — HYDROMORPHONE HCL 1 MG/ML IJ SOLN: 0.2500 mg | INTRAMUSCULAR | Status: DC | PRN

## 2020-09-14 MED ORDER — POLYETHYLENE GLYCOL 3350 17 G PO PACK: 17.0000 g | PACK | Freq: Every day | ORAL | Status: DC | PRN

## 2020-09-14 MED ORDER — CHLORHEXIDINE GLUCONATE 0.12 % MT SOLN
15.0000 mL | Freq: Once | OROMUCOSAL | Status: AC
  Administered 2020-09-14: 15 mL via OROMUCOSAL

## 2020-09-14 MED ORDER — TRANEXAMIC ACID-NACL 1000-0.7 MG/100ML-% IV SOLN
1000.0000 mg | INTRAVENOUS | Status: AC
Start: 2020-09-14 — End: 2020-09-14
  Administered 2020-09-14: 1000 mg via INTRAVENOUS
  Filled 2020-09-14: qty 100

## 2020-09-14 MED ORDER — ORAL CARE MOUTH RINSE: 15.0000 mL | Freq: Once | OROMUCOSAL | Status: AC

## 2020-09-14 MED ORDER — MIDAZOLAM HCL 2 MG/2ML IJ SOLN: 0.5000 mg | Freq: Once | INTRAMUSCULAR | Status: DC | PRN

## 2020-09-14 MED ORDER — PROPOFOL 10 MG/ML IV BOLUS
INTRAVENOUS | Status: DC | PRN
  Administered 2020-09-14 (×2): 20 mg via INTRAVENOUS
  Administered 2020-09-14: 10 mg via INTRAVENOUS

## 2020-09-14 MED ORDER — VANCOMYCIN HCL IN DEXTROSE 1-5 GM/200ML-% IV SOLN: 1000.0000 mg | Freq: Two times a day (BID) | INTRAVENOUS | Status: DC

## 2020-09-14 MED ORDER — HYDROCODONE-ACETAMINOPHEN 7.5-325 MG PO TABS: 1.0000 | ORAL_TABLET | ORAL | Status: DC | PRN

## 2020-09-14 MED ORDER — VANCOMYCIN HCL 1000 MG IV SOLR
INTRAVENOUS | Status: AC
  Filled 2020-09-14: qty 1000

## 2020-09-14 MED ORDER — DOCUSATE SODIUM 100 MG PO CAPS
100.0000 mg | ORAL_CAPSULE | Freq: Two times a day (BID) | ORAL | Status: DC
  Administered 2020-09-14 – 2020-09-16 (×4): 100 mg via ORAL
  Filled 2020-09-14 (×4): qty 1

## 2020-09-14 MED ORDER — SODIUM CHLORIDE 0.9 % IV SOLN: INTRAVENOUS | Status: DC

## 2020-09-14 MED ORDER — ASPIRIN 81 MG PO CHEW
81.0000 mg | CHEWABLE_TABLET | Freq: Two times a day (BID) | ORAL | Status: DC
  Administered 2020-09-14 – 2020-09-16 (×4): 81 mg via ORAL
  Filled 2020-09-14 (×4): qty 1

## 2020-09-14 MED ORDER — ONDANSETRON HCL 4 MG/2ML IJ SOLN
INTRAMUSCULAR | Status: DC | PRN
  Administered 2020-09-14: 4 mg via INTRAVENOUS

## 2020-09-14 MED ORDER — 0.9 % SODIUM CHLORIDE (POUR BTL) OPTIME
TOPICAL | Status: DC | PRN
  Administered 2020-09-14: 1000 mL

## 2020-09-14 MED ORDER — VANCOMYCIN HCL IN DEXTROSE 1-5 GM/200ML-% IV SOLN
1000.0000 mg | INTRAVENOUS | Status: AC
  Administered 2020-09-14: 1000 mg via INTRAVENOUS
  Filled 2020-09-14: qty 200

## 2020-09-14 SURGICAL SUPPLY — 41 items
ATTUNE PSRP INSR SZ6 5 KNEE (Insert) ×1 IMPLANT
BAG ZIPLOCK 12X15 (MISCELLANEOUS) ×2 IMPLANT
BNDG ELASTIC 6X5.8 VLCR STR LF (GAUZE/BANDAGES/DRESSINGS) ×2 IMPLANT
BOWL SMART MIX CTS (DISPOSABLE) ×2 IMPLANT
BRUSH FEMORAL CANAL (MISCELLANEOUS) ×2 IMPLANT
COVER SURGICAL LIGHT HANDLE (MISCELLANEOUS) ×2 IMPLANT
CUFF TOURN SGL QUICK 34 (TOURNIQUET CUFF) ×1
CUFF TRNQT CYL 34X4.125X (TOURNIQUET CUFF) ×1 IMPLANT
DERMABOND ADVANCED (GAUZE/BANDAGES/DRESSINGS) ×1
DERMABOND ADVANCED .7 DNX12 (GAUZE/BANDAGES/DRESSINGS) ×1 IMPLANT
DRAPE U-SHAPE 47X51 STRL (DRAPES) ×2 IMPLANT
DRESSING AQUACEL AG SP 3.5X10 (GAUZE/BANDAGES/DRESSINGS) ×1 IMPLANT
DRSG AQUACEL AG ADV 3.5X10 (GAUZE/BANDAGES/DRESSINGS) ×1 IMPLANT
DRSG AQUACEL AG SP 3.5X10 (GAUZE/BANDAGES/DRESSINGS) ×2
DURAPREP 26ML APPLICATOR (WOUND CARE) ×4 IMPLANT
ELECT REM PT RETURN 15FT ADLT (MISCELLANEOUS) ×2 IMPLANT
GLOVE SURG ENC MOIS LTX SZ6 (GLOVE) ×4 IMPLANT
GLOVE SURG UNDER LTX SZ7.5 (GLOVE) ×2 IMPLANT
GLOVE SURG UNDER POLY LF SZ6.5 (GLOVE) ×2 IMPLANT
GLOVE SURG UNDER POLY LF SZ7.5 (GLOVE) ×4 IMPLANT
GOWN STRL REUS W/TWL LRG LVL3 (GOWN DISPOSABLE) ×4 IMPLANT
HANDPIECE INTERPULSE COAX TIP (DISPOSABLE) ×1
JET LAVAGE IRRISEPT WOUND (IRRIGATION / IRRIGATOR) ×2
KIT TURNOVER KIT A (KITS) ×2 IMPLANT
LAVAGE JET IRRISEPT WOUND (IRRIGATION / IRRIGATOR) ×1 IMPLANT
MANIFOLD NEPTUNE II (INSTRUMENTS) ×2 IMPLANT
NS IRRIG 1000ML POUR BTL (IV SOLUTION) ×2 IMPLANT
PACK TOTAL KNEE CUSTOM (KITS) ×2 IMPLANT
PENCIL SMOKE EVACUATOR (MISCELLANEOUS) IMPLANT
PROTECTOR NERVE ULNAR (MISCELLANEOUS) ×2 IMPLANT
SET HNDPC FAN SPRY TIP SCT (DISPOSABLE) ×1 IMPLANT
SET PAD KNEE POSITIONER (MISCELLANEOUS) ×2 IMPLANT
STAPLER VISISTAT 35W (STAPLE) IMPLANT
SUT MNCRL AB 3-0 PS2 18 (SUTURE) ×2 IMPLANT
SUT STRATAFIX PDS+ 0 24IN (SUTURE) ×2 IMPLANT
SUT VIC AB 1 CT1 36 (SUTURE) ×4 IMPLANT
SUT VIC AB 2-0 CT1 27 (SUTURE) ×3
SUT VIC AB 2-0 CT1 TAPERPNT 27 (SUTURE) ×3 IMPLANT
TRAY FOLEY MTR SLVR 16FR STAT (SET/KITS/TRAYS/PACK) ×2 IMPLANT
WATER STERILE IRR 1000ML POUR (IV SOLUTION) ×2 IMPLANT
WRAP KNEE MAXI GEL POST OP (GAUZE/BANDAGES/DRESSINGS) ×2 IMPLANT

## 2020-09-14 NOTE — Interval H&P Note (Signed)
History and Physical Interval Note:  09/14/2020 9:43 AM  Crystal Russell  has presented today for surgery, with the diagnosis of Infected Right total knee arthroplasty.  The various methods of treatment have been discussed with the patient and family. After consideration of risks, benefits and other options for treatment, the patient has consented to  Procedure(s): Irrigation and debriement with poly exchange versus resection of right total knee arthroplasty and placement of antibitiotic spacer (Right) as a surgical intervention.  The patient's history has been reviewed, patient examined, no change in status, stable for surgery.  I have reviewed the patient's chart and labs.  Questions were answered to the patient's satisfaction.     Mauri Pole

## 2020-09-14 NOTE — Anesthesia Procedure Notes (Signed)
Anesthesia Regional Block: Adductor canal block   Pre-Anesthetic Checklist: , timeout performed,  Correct Patient, Correct Site, Correct Laterality,  Correct Procedure, Correct Position, site marked,  Risks and benefits discussed,  Surgical consent,  Pre-op evaluation,  At surgeon's request and post-op pain management  Laterality: Right and Lower  Prep: chloraprep       Needles:  Injection technique: Single-shot  Needle Type: Echogenic Needle     Needle Length: 9cm  Needle Gauge: 21     Additional Needles:   Procedures:,,,, ultrasound used (permanent image in chart),,    Narrative:  Start time: 09/14/2020 12:18 PM End time: 09/14/2020 12:24 PM Injection made incrementally with aspirations every 5 mL.  Performed by: Personally  Anesthesiologist: Annye Asa, MD  Additional Notes: Pt identified in Holding room.  Monitors applied. Working IV access confirmed. Sterile prep R thigh.  #21ga ECHOgenic ARROW block needle into adductor canal with US guidance.  20cc 0.75% Ropivacaine injected incrementally after negative test dose.  Patient asymptomatic, VSS, no heme aspirated, tolerated well.   Jenita Seashore, MD

## 2020-09-14 NOTE — Progress Notes (Signed)
AssistedDr. Carswell Jackson with right, ultrasound guided, adductor canal block. Side rails up, monitors on throughout procedure. See vital signs in flow sheet. Tolerated Procedure well.  

## 2020-09-14 NOTE — Anesthesia Procedure Notes (Signed)
Spinal  Patient location during procedure: OR End time: 09/14/2020 12:42 PM Reason for block: surgical anesthesia Staffing Performed: anesthesiologist  Anesthesiologist: Annye Asa, MD Resident/CRNA: Milford Cage, CRNA Preanesthetic Checklist Completed: patient identified, IV checked, site marked, risks and benefits discussed, surgical consent, monitors and equipment checked, pre-op evaluation and timeout performed Spinal Block Patient position: sitting Prep: DuraPrep and site prepped and draped Patient monitoring: continuous pulse ox, blood pressure, cardiac monitor and heart rate Approach: midline Location: L3-4 Injection technique: single-shot Needle Needle type: Pencan and Introducer  Needle gauge: 24 G Needle length: 9 cm Assessment Events: CSF return and second provider Additional Notes Pt identified in Operating room.  Monitors applied. Working IV access confirmed. Sterile prep, drape lumbar spine.  1% lido local L 3,4, CRNA all attempts os, repeat local L3,4 and #24ga Pencan into clear CSF L 3,4.  12mg  0.75% Bupivacaine with dextrose injected with asp CSF beginning and end of injection.  Patient asymptomatic, VSS, no heme aspirated, tolerated well.  Jenita Seashore, MD

## 2020-09-14 NOTE — Interval H&P Note (Signed)
History and Physical Interval Note:  09/14/2020 9:41 AM  Crystal Russell  has presented today for surgery, with the diagnosis of Infected Right total knee arthroplasty.  The various methods of treatment have been discussed with the patient and family. After consideration of risks, benefits and other options for treatment, the patient has consented to  Procedure(s): Irrigation and debriement with poly exchange versus resection of right total knee arthroplasty and placement of antibitiotic spacer (Right) as a surgical intervention.  The patient's history has been reviewed, patient examined, no change in status, stable for surgery.  I have reviewed the patient's chart and labs.  Questions were answered to the patient's satisfaction.     Mauri Pole

## 2020-09-14 NOTE — Progress Notes (Signed)
Patient was informed that Dr.Olin has been delayed by about 1 hour with his surgeries for today.  Patient voiced understanding. Patient states I will call my husband and let him know too.

## 2020-09-14 NOTE — Discharge Instructions (Signed)
INSTRUCTIONS AFTER SURGERY  Remove items at home which could result in a fall. This includes throw rugs or furniture in walking pathways ICE to the affected joint every three hours while awake for 30 minutes at a time, for at least the first 3-5 days, and then as needed for pain and swelling.  Continue to use ice for pain and swelling. You may notice swelling that will progress down to the foot and ankle.  This is normal after surgery.  Elevate your leg when you are not up walking on it.   Continue to use the breathing machine you got in the hospital (incentive spirometer) which will help keep your temperature down.  It is common for your temperature to cycle up and down following surgery, especially at night when you are not up moving around and exerting yourself.  The breathing machine keeps your lungs expanded and your temperature down.   DIET:  As you were doing prior to hospitalization, we recommend a well-balanced diet.  DRESSING / WOUND CARE / SHOWERING  Keep the surgical dressing until follow up.  The dressing is water proof, so you can shower without any extra covering.  IF THE DRESSING FALLS OFF or the wound gets wet inside, change the dressing with sterile gauze.  Please use good hand washing techniques before changing the dressing.  Do not use any lotions or creams on the incision until instructed by your surgeon.    ACTIVITY  Increase activity slowly as tolerated, but follow the weight bearing instructions below.   No driving for 6 weeks or until further direction given by your physician.  You cannot drive while taking narcotics.  No lifting or carrying greater than 10 lbs. until further directed by your surgeon. Avoid periods of inactivity such as sitting longer than an hour when not asleep. This helps prevent blood clots.  You may return to work once you are authorized by your doctor.     WEIGHT BEARING   Weight bearing as tolerated with assist device (walker, cane, etc) as  directed, use it as long as suggested by your surgeon or therapist, typically at least 4-6 weeks.   EXERCISES  Results after joint replacement surgery are often greatly improved when you follow the exercise, range of motion and muscle strengthening exercises prescribed by your doctor. Safety measures are also important to protect the joint from further injury. Any time any of these exercises cause you to have increased pain or swelling, decrease what you are doing until you are comfortable again and then slowly increase them. If you have problems or questions, call your caregiver or physical therapist for advice.   Rehabilitation is important following a joint replacement. After just a few days of immobilization, the muscles of the leg can become weakened and shrink (atrophy).  These exercises are designed to build up the tone and strength of the thigh and leg muscles and to improve motion. Often times heat used for twenty to thirty minutes before working out will loosen up your tissues and help with improving the range of motion but do not use heat for the first two weeks following surgery (sometimes heat can increase post-operative swelling).   These exercises can be done on a training (exercise) mat, on the floor, on a table or on a bed. Use whatever works the best and is most comfortable for you.    Use music or television while you are exercising so that the exercises are a pleasant break in your day. This   will make your life better with the exercises acting as a break in your routine that you can look forward to.   Perform all exercises about fifteen times, three times per day or as directed.  You should exercise both the operative leg and the other leg as well.  Exercises include:   Quad Sets - Tighten up the muscle on the front of the thigh (Quad) and hold for 5-10 seconds.   Straight Leg Raises - With your knee straight (if you were given a brace, keep it on), lift the leg to 60 degrees, hold  for 3 seconds, and slowly lower the leg.  Perform this exercise against resistance later as your leg gets stronger.  Leg Slides: Lying on your back, slowly slide your foot toward your buttocks, bending your knee up off the floor (only go as far as is comfortable). Then slowly slide your foot back down until your leg is flat on the floor again.  Angel Wings: Lying on your back spread your legs to the side as far apart as you can without causing discomfort.  Hamstring Strength:  Lying on your back, push your heel against the floor with your leg straight by tightening up the muscles of your buttocks.  Repeat, but this time bend your knee to a comfortable angle, and push your heel against the floor.  You may put a pillow under the heel to make it more comfortable if necessary.   A rehabilitation program following joint replacement surgery can speed recovery and prevent re-injury in the future due to weakened muscles. Contact your doctor or a physical therapist for more information on knee rehabilitation.    CONSTIPATION  Constipation is defined medically as fewer than three stools per week and severe constipation as less than one stool per week.  Even if you have a regular bowel pattern at home, your normal regimen is likely to be disrupted due to multiple reasons following surgery.  Combination of anesthesia, postoperative narcotics, change in appetite and fluid intake all can affect your bowels.   YOU MUST use at least one of the following options; they are listed in order of increasing strength to get the job done.  They are all available over the counter, and you may need to use some, POSSIBLY even all of these options:    Drink plenty of fluids (prune juice may be helpful) and high fiber foods Colace 100 mg by mouth twice a day  Senokot for constipation as directed and as needed Dulcolax (bisacodyl), take with full glass of water  Miralax (polyethylene glycol) once or twice a day as needed.  If  you have tried all these things and are unable to have a bowel movement in the first 3-4 days after surgery call either your surgeon or your primary doctor.    If you experience loose stools or diarrhea, hold the medications until you stool forms back up.  If your symptoms do not get better within 1 week or if they get worse, check with your doctor.  If you experience "the worst abdominal pain ever" or develop nausea or vomiting, please contact the office immediately for further recommendations for treatment.   ITCHING:  If you experience itching with your medications, try taking only a single pain pill, or even half a pain pill at a time.  You can also use Benadryl over the counter for itching or also to help with sleep.   TED HOSE STOCKINGS:  Use stockings on both legs until   for at least 2 weeks or as directed by physician office. They may be removed at night for sleeping.  MEDICATIONS:  See your medication summary on the "After Visit Summary" that nursing will review with you.  You may have some home medications which will be placed on hold until you complete the course of blood thinner medication.  It is important for you to complete the blood thinner medication as prescribed.  PRECAUTIONS:  If you experience chest pain or shortness of breath - call 911 immediately for transfer to the hospital emergency department.   If you develop a fever greater that 101 F, purulent drainage from wound, increased redness or drainage from wound, foul odor from the wound/dressing, or calf pain - CONTACT YOUR SURGEON.                                                   FOLLOW-UP APPOINTMENTS:  If you do not already have a post-op appointment, please call the office for an appointment to be seen by your surgeon.  Guidelines for how soon to be seen are listed in your "After Visit Summary", but are typically between 1-4 weeks after surgery.  OTHER INSTRUCTIONS:   Knee Replacement:  Do not place pillow under knee,  focus on keeping the knee straight while resting. CPM instructions: 0-90 degrees, 2 hours in the morning, 2 hours in the afternoon, and 2 hours in the evening. Place foam block, curve side up under heel at all times except when in CPM or when walking.  DO NOT modify, tear, cut, or change the foam block in any way.  POST-OPERATIVE OPIOID TAPER INSTRUCTIONS: It is important to wean off of your opioid medication as soon as possible. If you do not need pain medication after your surgery it is ok to stop day one. Opioids include: Codeine, Hydrocodone(Norco, Vicodin), Oxycodone(Percocet, oxycontin) and hydromorphone amongst others.  Long term and even short term use of opiods can cause: Increased pain response Dependence Constipation Depression Respiratory depression And more.  Withdrawal symptoms can include Flu like symptoms Nausea, vomiting And more Techniques to manage these symptoms Hydrate well Eat regular healthy meals Stay active Use relaxation techniques(deep breathing, meditating, yoga) Do Not substitute Alcohol to help with tapering If you have been on opioids for less than two weeks and do not have pain than it is ok to stop all together.  Plan to wean off of opioids This plan should start within one week post op of your joint replacement. Maintain the same interval or time between taking each dose and first decrease the dose.  Cut the total daily intake of opioids by one tablet each day Next start to increase the time between doses. The last dose that should be eliminated is the evening dose.   MAKE SURE YOU:  Understand these instructions.  Get help right away if you are not doing well or get worse.    Thank you for letting us be a part of your medical care team.  It is a privilege we respect greatly.  We hope these instructions will help you stay on track for a fast and full recovery!      

## 2020-09-14 NOTE — H&P (Signed)
TOTAL KNEE REVISION ADMISSION H&P  Patient is being admitted for right knee resection arthroplasty vs irrigation and debridement with polyethylene exchange  Subjective:  Chief Complaint:Infected right total knee arthroplasty   HPI: Crystal Russell, 70 y.o. female, has a history of pain and functional disability in the right knee(s) due to  acute right knee infection  .The indications for the revision of the total knee arthroplasty are  septic right total knee . She has a history of right total knee arthroplasty by Dr. Alvan Dame on 07/01/17. She had a relatively uncomplicated recovery, but did complain of some persistent pain in the knee. On 09/06/20, she presented to [facility] for evaluation of same day acute onset pain, locking, and swelling. She was re-evaluated on 09/08/20, and had symptoms consistent with acute knee infection. Her knee was aspirated and sent for [facility] analysis. Based on presentation and high concern for infection, she was scheduled for right knee I&D with poly exchange vs total knee resection arthoplasty with antibiotic spacer placement.  Patient Active Problem List   Diagnosis Date Noted   PAC (premature atrial contraction) 07/28/2020   PAF (paroxysmal atrial fibrillation) ([place]) 05/01/2020   Mild aortic valve regurgitation 05/01/2020   Mild mitral regurgitation 05/01/2020   PFO (patent foramen ovale) 05/01/2020   Laceration of skin of right hand 03/19/2020   Diverticulitis 03/16/2020   Hyperglycemia 03/16/2020   Vertigo 09/14/2019   Overweight (BMI 25.0-29.9) 07/02/2017   Status post total right knee replacement 07/02/2017   S/P right TKA 07/01/2017   Right knee pain 06/16/2017   Postmenopausal bleeding 12/06/2015   Varicose veins of bilateral lower extremities with other complications 76/72/0947   Sun-damaged skin 12/04/2014   Breast mass, left 05/16/2014   Varicose veins of lower extremities with other complications 09/62/8366   Preventative health care 08/08/2013    Hypothyroidism 08/08/2013   Atrophic vaginitis 08/08/2013   Cervical cancer screening 08/05/2013   ETD (eustachian tube dysfunction) 05/26/2013   Depression with anxiety 12/27/2012   Ventral hernia 08/05/2012   Varicose veins 08/05/2012   DJD (degenerative joint disease) 08/05/2012   Constipation 08/05/2012   Hyperlipidemia, mixed 08/05/2012   H/O gestational diabetes mellitus, not currently pregnant 08/05/2012   Chicken pox    Shingles    Paroxysmal A-fib (HCC)    Heart murmur    Spondylisthesis    Past Medical History:  Diagnosis Date   Abdominal wall hernia 08/04/2012   Atrial fibrillation ([place]) 2008   REPORTS SHE WAS AT [place], UPON WAKING I FELT MY HEART GOING BOOM BOOM BOOM BEATING OUT OF MY CHEST. I WENT TO HOSPITAL AND THEY ISED MEDICINE TO CONVERT ME , IT TOOK OVER 10 HOURS TO CONVERT ME. BUT AFTER THAT IVE NEVER HAD ANY ISSUES SINCE. I USED TO HAVE A CARDIOLOGUIST AS MY PRIMARY IN [place] BUT IHAVENT BEEN TO A CARDIOLOGIST SINCE I MOVED DOWN HERE.    Chicken pox as a child   Depression    Depression with anxiety 12/27/2012   Diverticulitis    DJD (degenerative joint disease) 08/05/2012   DVT (deep venous thrombosis) ([place]) 2018   Fifth disease 1989   DX AFTER EXPOSURE FROM HER CHILDREN; DEVELOPED RHEUMATOID ARTHRITIS DURING COURSE OF ILLNESS. REPORTS:  " I DONT HAVE IT ANYMORE SINCE THE DISEASE IS GONE"    H/O gestational diabetes mellitus, not currently pregnant 08/05/2012   History of    Heart murmur    echo 2012; "I HAVE A FUNCTIONAL MURMUR"    Hyperlipidemia  Hypothyroidism    Knee effusion, right    while on Eliquis, discontinued Eliquis after DVT resolved   Measles as a child   Migraines    Other and unspecified hyperlipidemia 08/05/2012   Paroxysmal A-fib ([place])    Shingles 43   Spondylisthesis 2012   L5/S1 grade 3   Varicose veins 08/05/2012   Extending up to right groin B/l LE   Ventral hernia 08/05/2012   Wears glasses     Past Surgical History:   Procedure Laterality Date   BREAST DUCTAL SYSTEM EXCISION Right 05/05/2014   Procedure: RIGHT BREAST DUCT EXCISION;  Surgeon: Rolm Bookbinder, MD;  Location: [facility];  Service: General;  Laterality: Right;   COLONOSCOPY     DILATION AND CURETTAGE OF UTERUS     x4   MOUTH SURGERY     TOTAL KNEE ARTHROPLASTY Right 07/01/2017   Procedure: RIGHT TOTAL KNEE ARTHROPLASTY;  Surgeon: Paralee Cancel, MD;  Location: [facility];  Service: Orthopedics;  Laterality: Right;  70 mins   [place]   right leg    No current facility-administered medications for this encounter.   Current Outpatient Medications  Medication Sig Dispense Refill Last Dose   acetaminophen (TYLENOL) 500 MG tablet Take 1,000 mg by mouth in the morning and at bedtime.      atorvastatin (LIPITOR) 10 MG tablet Take 0.5 tablets (5 mg total) by mouth daily. (Patient taking differently: Take 5 mg by mouth every evening.) 45 tablet 3    Cholecalciferol (PA VITAMIN D-3) 50 MCG (2000 UT) CAPS Take 4,000 Units by mouth every evening.      ciprofloxacin (CIPRO) 500 MG tablet Take 1 tablet (500 mg total) by mouth 2 (two) times daily. 14 tablet 0    Coenzyme Q10 300 MG CAPS Take 300 mg by mouth every evening.      levothyroxine (SYNTHROID) 100 MCG tablet Take 1 tablet (100 mcg total) by mouth daily before breakfast. (Patient taking differently: Take 100 mcg by mouth in the morning.) 90 tablet 1    metroNIDAZOLE (FLAGYL) 500 MG tablet Take 500 mg by mouth 2 (two) times daily.      vitamin B-12 (CYANOCOBALAMIN) 1000 MCG tablet Take 1,000 mcg by mouth every evening.      aspirin EC 81 MG tablet Take 81 mg by mouth daily. Swallow whole.      polyethylene glycol (MIRALAX / GLYCOLAX) 17 g packet Take 17 g by mouth 2 (two) times daily.      Allergies  Allergen Reactions   Crestor [Rosuvastatin] Other (See Comments)    Joint pain w/fibromyalgia   Pravachol [Pravastatin] Other (See Comments)    Joint pain  w/fibromyalgia   Penicillins Swelling, Rash and Other (See Comments)    Hands and feet swell, rash; childhood allergy  Has patient had a PCN reaction causing immediate rash, facial/tongue/throat swelling, SOB or lightheadedness with hypotension: Unknown Has patient had a PCN reaction causing severe rash involving mucus membranes or skin necrosis: Unknown Has patient had a PCN reaction that required hospitalization: Yes Has patient had a PCN reaction occurring within the last 10 years: No If all of the above answers are "NO", then may proceed with Cephalosporin use.     Social History   Tobacco Use   Smoking status: Former    Years: 4.00    Pack years: 0.00    Types: Cigarettes   Smokeless tobacco: Never  Substance Use Topics   Alcohol use: No  Family History  Problem Relation Age of Onset   Cancer Mother        lung- smoker   Liver disease Mother    Cancer Father        lung- smoker   Peripheral Artery Disease Sister    Dementia Maternal Grandmother    Heart attack Maternal Grandfather    Asthma Son    Ulcerative colitis Son    Asthma Son    Ulcerative colitis Son    Heart attack Paternal Uncle       Review of Systems  Constitutional:  Negative for chills and fever.  Respiratory:  Negative for cough and shortness of breath.   Cardiovascular:  Negative for chest pain.  Gastrointestinal:  Negative for nausea and vomiting.  Musculoskeletal:  Positive for arthralgias.    Objective:  Physical Exam Alert and oriented. No acute distress. Ambulates with walker.  Right Hip: No significant pain with gentle log rolling of the hip. Does not tolerate full hip exam secondary to pain in the knee with flexion.  Right Knee: Well healed arthroplasty scar. No surrounding erythema. Somewhat warm to the touch. Mild effusion noted. Patient rests with the knee extended fairly comfortably, but is unable to tolerate flexion past about 80 degrees. She is able to perform active straight  leg raise.  Sensation and motor function intact in LE. Distal pulses 2+. Calves soft and nontender. Vital signs in last 24 hours:    Labs:  Estimated body mass index is 21.8 kg/m as calculated from the following:   Height as of this encounter: 5' 4.5" (1.638 m).   Weight as of this encounter: 58.5 kg.  Imaging Review Plain radiographs demonstrate right knee prosthesis in good positioning with no evidence of loosening or periprosthetic abnormalities.     Assessment/Plan:  End stage arthritis, right knee(s) with failed previous arthroplasty.   The patient history, physical examination, clinical judgment of the provider and imaging studies are consistent with end stage degenerative joint disease of the right knee(s), previous total knee arthroplasty. Revision total knee arthroplasty is deemed medically necessary. The treatment options including medical management, injection therapy, arthroscopy and revision arthroplasty were discussed at length. The risks and benefits of revision total knee arthroplasty were presented and reviewed. The risks due to aseptic loosening, infection, stiffness, patella tracking problems, thromboembolic complications and other imponderables were discussed. The patient acknowledged the explanation, agreed to proceed with the plan and consent was signed. Patient is being admitted for inpatient treatment for surgery, pain control, PT, OT, prophylactic antibiotics, VTE prophylaxis, progressive ambulation and ADL's and discharge planning.The patient is planning to be discharged  home.    Griffith Citron, PA-C Orthopedic Surgery [facility] [place] [phone]

## 2020-09-14 NOTE — Anesthesia Preprocedure Evaluation (Addendum)
Anesthesia Evaluation  Patient identified by MRN, date of birth, ID band Patient awake    Reviewed: Allergy & Precautions, NPO status , Patient's Chart, lab work & pertinent test results  History of Anesthesia Complications Negative for: history of anesthetic complications  Airway Mallampati: II  TM Distance: >3 FB Neck ROM: Full    Dental  (+) Dental Advisory Given   Pulmonary former smoker,  09/12/2020 SARS coronavirus NEG   breath sounds clear to auscultation       Cardiovascular (-) angina+ DVT  + dysrhythmias Atrial Fibrillation + Valvular Problems/Murmurs AI and MR  Rhythm:Regular Rate:Normal  '21 ECHO: EF 55- 60%. The LV has normal function, mild MR, mild-mod AI   Neuro/Psych  Headaches, Anxiety Depression    GI/Hepatic negative GI ROS, Neg liver ROS,   Endo/Other  Hypothyroidism   Renal/GU negative Renal ROS     Musculoskeletal  (+) Arthritis , Osteoarthritis,    Abdominal   Peds  Hematology negative hematology ROS (+)   Anesthesia Other Findings   Reproductive/Obstetrics                            Anesthesia Physical Anesthesia Plan  ASA: 2  Anesthesia Plan: Spinal   Post-op Pain Management:  [facility] for Post-op pain   Induction:   PONV Risk Score and Plan: 2 and Ondansetron and Dexamethasone  Airway Management Planned: Natural Airway and Simple Face Mask  Additional Equipment: None  Intra-op Plan:   Post-operative Plan:   Informed Consent: I have reviewed the patients History and Physical, chart, labs and discussed the procedure including the risks, benefits and alternatives for the proposed anesthesia with the patient or authorized representative who has indicated his/her understanding and acceptance.     Dental advisory given  Plan Discussed with: CRNA and Surgeon  Anesthesia Plan Comments:        Anesthesia Quick Evaluation

## 2020-09-14 NOTE — Transfer of Care (Signed)
Immediate Anesthesia Transfer of Care Note  Patient: Crystal Russell  Procedure(s) Performed: Irrigation and debriement with poly exchange versus resection of right total knee arthroplasty and placement of antibitiotic spacer (Right: Knee)  Patient Location: PACU  Anesthesia Type:Spinal  Level of Consciousness: awake  Airway & Oxygen Therapy: Patient Spontanous Breathing  Post-op Assessment: Report given to RN and Post -op Vital signs reviewed and stable  Post vital signs: Reviewed and stable  Last Vitals:  Vitals Value Taken Time  BP 106/59 09/14/20 1417  Temp    Pulse 55 09/14/20 1420  Resp 8 09/14/20 1420  SpO2 98 % 09/14/20 1420  Vitals shown include unvalidated device data.  Last Pain:  Vitals:   09/14/20 1226  TempSrc:   PainSc: 0-No pain         Complications: No notable events documented.

## 2020-09-14 NOTE — Progress Notes (Addendum)
Pharmacy Antibiotic Note  Crystal Russell is a 70 y.o. 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  Pharmacy has been consulted for vancomycin dosing.  Plan: Vancomycin 1000 mg IV preop, then 1000 mg IV q24 hr (est AUC 24 based on SCr 0.8; Vd 0.72) Measure vancomycin AUC at steady state as indicated SCr q48 while on vanc Rocephin 2g IV q24 per MD; dosing appropriate Of note, patient started on 7 days of Cipro/Flagyl on 6/5 for possible diverticulitis Flagyl 500 PO bid has been resumed inpatient to complete the course, but Cipro has (appropriately) been discontinued as Rocephin will provide similar coverage   Height: 5' 4.5" (163.8 cm) Weight: 58.2 kg (128 lb 3.2 oz) IBW/kg (Calculated) : 55.85  Temp (24hrs), Avg:98.1 F (36.7 C), Min:97.6 F (36.4 C), Max:98.6 F (37 C)  Recent Labs  Lab 09/14/20 1015  WBC 5.6  CREATININE 0.56    Estimated Creatinine Clearance: 58.6 mL/min (by C-G formula based on SCr of 0.56 mg/dL).    Allergies  Allergen Reactions   Crestor [Rosuvastatin] Other (See Comments)    Joint pain w/fibromyalgia   Pravachol [Pravastatin] Other (See Comments)    Joint pain w/fibromyalgia   Penicillins Swelling, Rash and Other (See Comments)    Hands and feet swell, rash; childhood allergy  Has patient had a PCN reaction causing immediate rash, facial/tongue/throat swelling, SOB or lightheadedness with hypotension: Unknown Has patient had a PCN reaction causing severe rash involving mucus membranes or skin necrosis: Unknown Has patient had a PCN reaction that required hospitalization: Yes Has patient had a PCN reaction occurring within the last 10 years: No If all of the above answers are "NO", then may proceed with Cephalosporin use.     Antimicrobials this admission: 6/9 vancomycin >>  6/9 Rocephin >>  6/5 Flagyl >> (6/11)  Dose adjustments this admission: n/a  Microbiology results: None yet  Thank you for allowing pharmacy to be a part of this  patient's care.  Breta Demedeiros A 09/14/2020 4:20 PM

## 2020-09-15 ENCOUNTER — Encounter (HOSPITAL_COMMUNITY): Payer: Self-pay | Admitting: Orthopedic Surgery

## 2020-09-15 DIAGNOSIS — K5909 Other constipation: Secondary | ICD-10-CM

## 2020-09-15 DIAGNOSIS — B958 Unspecified staphylococcus as the cause of diseases classified elsewhere: Secondary | ICD-10-CM

## 2020-09-15 DIAGNOSIS — T8453XA Infection and inflammatory reaction due to internal right knee prosthesis, initial encounter: Principal | ICD-10-CM

## 2020-09-15 LAB — CBC
HCT: 33.4 % — ABNORMAL LOW (ref 36.0–46.0)
Hemoglobin: 11 g/dL — ABNORMAL LOW (ref 12.0–15.0)
MCH: 29 pg (ref 26.0–34.0)
MCHC: 32.9 g/dL (ref 30.0–36.0)
MCV: 88.1 fL (ref 80.0–100.0)
Platelets: 279 10*3/uL (ref 150–400)
RBC: 3.79 MIL/uL — ABNORMAL LOW (ref 3.87–5.11)
RDW: 12 % (ref 11.5–15.5)
WBC: 6.1 10*3/uL (ref 4.0–10.5)
nRBC: 0 % (ref 0.0–0.2)

## 2020-09-15 LAB — BASIC METABOLIC PANEL
Anion gap: 7 (ref 5–15)
BUN: 6 mg/dL — ABNORMAL LOW (ref 8–23)
CO2: 26 mmol/L (ref 22–32)
Calcium: 8.7 mg/dL — ABNORMAL LOW (ref 8.9–10.3)
Chloride: 100 mmol/L (ref 98–111)
Creatinine, Ser: 0.57 mg/dL (ref 0.44–1.00)
GFR, Estimated: >60 mL/min (ref >60)
Glucose, Bld: 122 mg/dL — ABNORMAL HIGH (ref 70–99)
Potassium: 4.3 mmol/L (ref 3.5–5.1)
Sodium: 133 mmol/L — ABNORMAL LOW (ref 135–145)

## 2020-09-15 MED ORDER — CEFAZOLIN IV (FOR PTA / DISCHARGE USE ONLY): 2.0000 g | Freq: Three times a day (TID) | INTRAVENOUS | 0 refills | Status: DC

## 2020-09-15 MED ORDER — EPINEPHRINE 0.3 MG/0.3ML IJ SOAJ
0.3000 mg | Freq: Once | INTRAMUSCULAR | Status: DC | PRN
  Filled 2020-09-15: qty 0.6

## 2020-09-15 MED ORDER — CEFAZOLIN SODIUM-DEXTROSE 2-4 GM/100ML-% IV SOLN
2.0000 g | Freq: Three times a day (TID) | INTRAVENOUS | Status: DC
  Administered 2020-09-15 – 2020-09-16 (×2): 2 g via INTRAVENOUS
  Filled 2020-09-15 (×2): qty 100

## 2020-09-15 MED ORDER — CHLORHEXIDINE GLUCONATE CLOTH 2 % EX PADS
6.0000 | MEDICATED_PAD | Freq: Every day | CUTANEOUS | Status: DC
  Administered 2020-09-15 – 2020-09-16 (×2): 6 via TOPICAL

## 2020-09-15 MED ORDER — AMOXICILLIN 500 MG PO CAPS
500.0000 mg | ORAL_CAPSULE | Freq: Once | ORAL | Status: DC
  Filled 2020-09-15: qty 1

## 2020-09-15 MED ORDER — DIPHENHYDRAMINE HCL 50 MG/ML IJ SOLN: 25.0000 mg | Freq: Once | INTRAMUSCULAR | Status: DC | PRN

## 2020-09-15 MED ORDER — RIFAMPIN 300 MG PO CAPS
300.0000 mg | ORAL_CAPSULE | Freq: Two times a day (BID) | ORAL | Status: DC
  Administered 2020-09-15 – 2020-09-16 (×3): 300 mg via ORAL
  Filled 2020-09-15 (×3): qty 1

## 2020-09-15 MED ORDER — RIFAMPIN 300 MG PO CAPS: 300.0000 mg | ORAL_CAPSULE | Freq: Two times a day (BID) | ORAL | 0 refills | Status: DC

## 2020-09-15 MED ORDER — SODIUM CHLORIDE 0.9% FLUSH: 10.0000 mL | INTRAVENOUS | Status: DC | PRN

## 2020-09-15 MED ORDER — AMOXICILLIN 500 MG PO CAPS
500.0000 mg | ORAL_CAPSULE | Freq: Once | ORAL | Status: AC
  Administered 2020-09-15: 500 mg via ORAL
  Filled 2020-09-15: qty 1

## 2020-09-15 NOTE — Anesthesia Postprocedure Evaluation (Signed)
Anesthesia Post Note  Patient: Jaicey Sweaney  Procedure(s) Performed: Irrigation and debriement with poly exchange versus resection of right total knee arthroplasty and placement of antibitiotic spacer (Right: Knee)     Patient location during evaluation: PACU Anesthesia Type: Spinal, MAC and Regional Level of consciousness: awake and alert Pain management: pain level controlled Vital Signs Assessment: post-procedure vital signs reviewed and stable Respiratory status: spontaneous breathing, nonlabored ventilation, respiratory function stable and patient connected to nasal cannula oxygen Cardiovascular status: stable and blood pressure returned to baseline Postop Assessment: no apparent nausea or vomiting Anesthetic complications: no   No notable events documented.  Last Vitals:  Vitals:   09/15/20 1714 09/15/20 1828  BP: 123/68 113/65  Pulse: (!) 50 (!) 51  Resp:    Temp:  37.1 C  SpO2: 100% 100%    Last Pain:  Vitals:   09/15/20 1828  TempSrc: Oral  PainSc:                  March Rummage Abhishek Levesque

## 2020-09-15 NOTE — Progress Notes (Signed)
Penicillin Allergy Note  Assessment: Patient has a listed allergy to penicillin of rash/swelling. When asked about this reaction she says it happened when she was about 70 years old. She was getting penicillin for a strep infection and she developed a rash and swelling in her legs and hands. She has not tried penicillin since this time. Given the high likelihood she has lost this allergy and low risk nature of this remote reaction we will give her a one time dose of amoxicillin. If she tolerates this dose with no side effects an hour after taking, we will remove the allergy.   Plan:  -Amoxicillin 500mg  x1 -Remove allergy if tolerated  -Vital signs q80mins   Nicoletta Dress, PharmD, [place] [facility] Pharmacist  Phone: [phone]

## 2020-09-15 NOTE — Brief Op Note (Signed)
09/14/2020  3:35 PM  PATIENT:  Crystal Russell  70 y.o. female  PRE-OPERATIVE DIAGNOSIS:  Infected Right total knee arthroplasty  POST-OPERATIVE DIAGNOSIS:  Infected Right total knee arthroplasty  PROCEDURE:  Procedure(s): Irrigation and debriement with poly exchange versus resection of right total knee arthroplasty and placement of antibitiotic spacer (Right)  SURGEON:  Surgeon(s) and Role:    Paralee Cancel, MD - Primary  PHYSICIAN ASSISTANT: Costella Hatcher, PA-C   ANESTHESIA:   regional and spinal  EBL:  100 mL   BLOOD ADMINISTERED:none  DRAINS: none   LOCAL MEDICATIONS USED:  OTHER 1 gm of Vancomycin powder  SPECIMEN:  No Specimen  DISPOSITION OF SPECIMEN:  N/A  COUNTS:  YES  TOURNIQUET:   Total Tourniquet Time Documented: Thigh (Right) - 26 minutes Total: Thigh (Right) - 26 minutes   DICTATION: .Other Dictation: Dictation Number [id number]  PLAN OF CARE: Admit to inpatient   PATIENT DISPOSITION:  PACU - hemodynamically stable.   Delay start of Pharmacological VTE agent (>24hrs) due to surgical blood loss or risk of bleeding: no

## 2020-09-15 NOTE — Progress Notes (Signed)
PHARMACY CONSULT NOTE FOR:  OUTPATIENT  PARENTERAL ANTIBIOTIC THERAPY (OPAT)  Indication: PJI Regimen: cefazolin 2g IV q8h End date: 10/27/2020  IV antibiotic discharge orders are pended. To discharging provider:  please sign these orders via discharge navigator,  Select New Orders & click on the button choice - Manage This Unsigned Work.     Thank you for allowing pharmacy to be a part of this patient's care.  Phillis Haggis 09/15/2020, 2:14 PM

## 2020-09-15 NOTE — Progress Notes (Signed)
Peripherally Inserted Central Catheter Placement  The IV Nurse has discussed with the patient and/or persons authorized to consent for the patient, the purpose of this procedure and the potential benefits and risks involved with this procedure.  The benefits include less needle sticks, lab draws from the catheter, and the patient may be discharged home with the catheter. Risks include, but not limited to, infection, bleeding, blood clot (thrombus formation), and puncture of an artery; nerve damage and irregular heartbeat and possibility to perform a PICC exchange if needed/ordered by physician.  Alternatives to this procedure were also discussed.  Bard Power PICC patient education guide, fact sheet on infection prevention and patient information card has been provided to patient /or left at bedside.    PICC Placement Documentation  PICC Single Lumen 50/09/38 Right Basilic 36 cm 0 cm (Active)  Indication for Insertion or Continuance of Line Home intravenous therapies (PICC only) 09/15/20 1215  Exposed Catheter (cm) 0 cm 09/15/20 1215  Site Assessment Clean;Intact;Dry 09/15/20 1215  Line Status Flushed;Blood return noted;Saline locked 09/15/20 1215  Dressing Type Transparent 09/15/20 1215  Dressing Status Clean;Dry;Intact 09/15/20 1215  Antimicrobial disc in place? Yes 09/15/20 1215  Dressing Change Due 09/22/20 09/15/20 1215       Scotty Court 09/15/2020, 12:33 PM

## 2020-09-15 NOTE — Consult Note (Addendum)
[facility] for Infectious Disease  Total days of antibiotics 2 ( also on cipro/metro coming into the hospital)               Reason for Consult:right knee pji   Referring Physician: Alvan Dame  Active Problems:   Infection of prosthetic right knee joint [facility])    HPI: Crystal Russell is a 70 y.o. female with history of Afib, diverticulitis, hx of right TKA who had sudden onset of knee swelling and pain roughly 1 week ago. She noticed it becoming increasing difficult to weight bear on her right leg. She also had concurrent fevers ranging from 100.3- 101.74F. she also noticed lower abdominal discomfort and constipation where she was concerned that she was having a diverticulitis flare. She was started on cipro/metronidazole. When she called dr Aurea Graff office for update on changes to her knee, she was brought in for evaluation and arthrocentesis. Her CRP was elevated at 80. Plus, cx grew staph mutans. She was brought in for either IXD with synovectomy and poly exchange vs. Abtx spacer. She ended up having IXD with poly-exchange. She tolerated picc line placement. No fevers thus far.  She reports having amoxicillin allergy as a child when she had strep infection  Past Medical History:  Diagnosis Date   Abdominal wall hernia 08/04/2012   Atrial fibrillation ([place]) 2008   REPORTS SHE WAS AT [place], UPON WAKING I FELT MY HEART GOING BOOM BOOM BOOM BEATING OUT OF MY CHEST. I WENT TO HOSPITAL AND THEY ISED MEDICINE TO CONVERT ME , IT TOOK OVER 10 HOURS TO CONVERT ME. BUT AFTER THAT IVE NEVER HAD ANY ISSUES SINCE. I USED TO HAVE A CARDIOLOGUIST AS MY PRIMARY IN [place] BUT IHAVENT BEEN TO A CARDIOLOGIST SINCE I MOVED DOWN HERE.    Chicken pox as a child   Depression    Depression with anxiety 12/27/2012   Diverticulitis    DJD (degenerative joint disease) 08/05/2012   DVT (deep venous thrombosis) ([place]) 2018   Fifth disease 1989   DX AFTER EXPOSURE FROM HER CHILDREN; DEVELOPED RHEUMATOID ARTHRITIS DURING  COURSE OF ILLNESS. REPORTS:  " I DONT HAVE IT ANYMORE SINCE THE DISEASE IS GONE"    H/O gestational diabetes mellitus, not currently pregnant 08/05/2012   History of    Heart murmur    echo 2012; "I HAVE A FUNCTIONAL MURMUR"    Hyperlipidemia    Hypothyroidism    Knee effusion, right    while on Eliquis, discontinued Eliquis after DVT resolved   Measles as a child   Migraines    Other and unspecified hyperlipidemia 08/05/2012   Paroxysmal A-fib ([place])    Shingles 43   Spondylisthesis 2012   L5/S1 grade 3   Varicose veins 08/05/2012   Extending up to right groin B/l LE   Ventral hernia 08/05/2012   Wears glasses     Allergies:  Allergies  Allergen Reactions   Crestor [Rosuvastatin] Other (See Comments)    Joint pain w/fibromyalgia   Pravachol [Pravastatin] Other (See Comments)    Joint pain w/fibromyalgia   Penicillins Swelling, Rash and Other (See Comments)    Hands and feet swell, rash; childhood allergy  Has patient had a PCN reaction causing immediate rash, facial/tongue/throat swelling, SOB or lightheadedness with hypotension: Unknown Has patient had a PCN reaction causing severe rash involving mucus membranes or skin necrosis: Unknown Has patient had a PCN reaction that required hospitalization: Yes Has patient had a PCN reaction occurring within the last  10 years: No If all of the above answers are "NO", then may proceed with Cephalosporin use.      MEDICATIONS:  amoxicillin  500 mg Oral Once   aspirin  81 mg Oral BID   atorvastatin  5 mg Oral QHS   Chlorhexidine Gluconate Cloth  6 each Topical Daily   dexamethasone (DECADRON) injection  10 mg Intravenous Once   docusate sodium  100 mg Oral BID   ferrous sulfate  325 mg Oral TID PC   levothyroxine  100 mcg Oral Q0600   rifampin  300 mg Oral Q12H    Social History   Tobacco Use   Smoking status: Former    Years: 4.00    Pack years: 0.00    Types: Cigarettes   Smokeless tobacco: Never  Vaping Use   Vaping  Use: Never used  Substance Use Topics   Alcohol use: No   Drug use: No    Family History  Problem Relation Age of Onset   Cancer Mother        lung- smoker   Liver disease Mother    Cancer Father        lung- smoker   Peripheral Artery Disease Sister    Dementia Maternal Grandmother    Heart attack Maternal Grandfather    Asthma Son    Ulcerative colitis Son    Asthma Son    Ulcerative colitis Son    Heart attack Paternal Uncle     Review of Systems -   Constitutional: Negative for fever, chills, diaphoresis, activity change, appetite change, fatigue and unexpected weight change.  HENT: Negative for congestion, sore throat, rhinorrhea, sneezing, trouble swallowing and sinus pressure.  Eyes: Negative for photophobia and visual disturbance.  Respiratory: Negative for cough, chest tightness, shortness of breath, wheezing and stridor.  Cardiovascular: Negative for chest pain, palpitations and leg swelling.  Gastrointestinal: Negative for nausea, vomiting, abdominal pain, diarrhea, constipation, blood in stool, abdominal distention and anal bleeding.  Genitourinary: Negative for dysuria, hematuria, flank pain and difficulty urinating.  Musculoskeletal: Negative for myalgias, back pain, joint swelling, arthralgias and gait problem.  Skin: Negative for color change, pallor, rash and wound.  Neurological: Negative for dizziness, tremors, weakness and light-headedness.  Hematological: Negative for adenopathy. Does not bruise/bleed easily.  Psychiatric/Behavioral: Negative for behavioral problems, confusion, sleep disturbance, dysphoric mood, decreased concentration and agitation.    OBJECTIVE: Temp:  [97.5 F (36.4 C)-97.9 F (36.6 C)] 97.8 F (36.6 C) (06/10 0933) Pulse Rate:  [44-65] 59 (06/10 0933) Resp:  [11-17] 17 (06/10 0933) BP: (97-147)/(57-74) 97/57 (06/10 0933) SpO2:  [96 %-100 %] 100 % (06/10 0933) Physical Exam  Constitutional:  oriented to person, place, and time.  appears well-developed and well-nourished. No distress.  HENT: [place]/AT, PERRLA, no scleral icterus Mouth/Throat: Oropharynx is clear and moist. No oropharyngeal exudate.  Cardiovascular: Normal rate, regular rhythm and normal heart sounds. Exam reveals no gallop and no friction rub.  No murmur heard.  Pulmonary/Chest: Effort normal and breath sounds normal. No respiratory distress.  has no wheezes.  Neck = supple, no nuchal rigidity Abdominal: Soft. Bowel sounds are normal.  exhibits no distension. There is no tenderness.  Ext: right leg wrapped. No drains in place. Trace edema to right leg Neurological: alert and oriented to person, place, and time.  Skin: Skin is warm and dry. No rash noted. No erythema.  Psychiatric: a normal mood and affect.  behavior is normal.    LABS: Results for orders placed or performed  during the hospital encounter of 09/14/20 (from the past 48 hour(s))  Surgical pcr screen     Status: None   Collection Time: 09/14/20 10:05 AM   Specimen: Nasal Mucosa; Nasal Swab  Result Value Ref Range   MRSA, PCR NEGATIVE NEGATIVE   Staphylococcus aureus NEGATIVE NEGATIVE    Comment: (NOTE) The Xpert SA Assay (FDA approved for NASAL specimens in patients 62 years of age and older), is one component of a comprehensive surveillance program. It is not intended to diagnose infection nor to guide or monitor treatment. Performed at [facility], [place] [address] [place], [place] [address]   Type and screen Order type and screen if day of surgery is less than 15 days from draw of preadmission visit or order morning of surgery if day of surgery is greater than 6 days from preadmission visit.     Status: None   Collection Time: 09/14/20 10:15 AM  Result Value Ref Range   ABO/RH(D) O NEG    Antibody Screen NEG    Sample Expiration      09/17/2020,2359 Performed at [facility], [place] [address] [place], [place] [address]   CBC     Status:  None   Collection Time: 09/14/20 10:15 AM  Result Value Ref Range   WBC 5.6 4.0 - 10.5 K/uL   RBC 4.63 3.87 - 5.11 MIL/uL   Hemoglobin 13.4 12.0 - 15.0 g/dL   HCT 40.5 36.0 - 46.0 %   MCV 87.5 80.0 - 100.0 fL   MCH 28.9 26.0 - 34.0 pg   MCHC 33.1 30.0 - 36.0 g/dL   RDW 11.9 11.5 - 15.5 %   Platelets 323 150 - 400 K/uL   nRBC 0.0 0.0 - 0.2 %    Comment: Performed at [facility], [place] [address], [place], [place] [address]  Basic metabolic panel     Status: Abnormal   Collection Time: 09/14/20 10:15 AM  Result Value Ref Range   Sodium 134 (L) 135 - 145 mmol/L   Potassium 3.8 3.5 - 5.1 mmol/L   Chloride 99 98 - 111 mmol/L   CO2 27 22 - 32 mmol/L   Glucose, Bld 101 (H) 70 - 99 mg/dL    Comment: Glucose reference range applies only to samples taken after fasting for at least 8 hours.   BUN 7 (L) 8 - 23 mg/dL   Creatinine, Ser 0.56 0.44 - 1.00 mg/dL   Calcium 9.5 8.9 - 10.3 mg/dL   GFR, Estimated >60 >60 mL/min    Comment: (NOTE) Calculated using the CKD-EPI Creatinine Equation (2021)    Anion gap 8 5 - 15    Comment: Performed at [facility], [place] [address], [place], [place] [address]  CBC     Status: Abnormal   Collection Time: 09/15/20  3:35 AM  Result Value Ref Range   WBC 6.1 4.0 - 10.5 K/uL   RBC 3.79 (L) 3.87 - 5.11 MIL/uL   Hemoglobin 11.0 (L) 12.0 - 15.0 g/dL   HCT 33.4 (L) 36.0 - 46.0 %   MCV 88.1 80.0 - 100.0 fL   MCH 29.0 26.0 - 34.0 pg   MCHC 32.9 30.0 - 36.0 g/dL   RDW 12.0 11.5 - 15.5 %   Platelets 279 150 - 400 K/uL   nRBC 0.0 0.0 - 0.2 %    Comment: Performed at [facility], [place] [address] [place], [place] [address]  Basic metabolic panel  Status: Abnormal   Collection Time: 09/15/20  3:35 AM  Result Value Ref Range   Sodium 133 (L) 135 - 145 mmol/L   Potassium 4.3 3.5 - 5.1 mmol/L   Chloride 100 98 - 111 mmol/L   CO2 26 22 - 32 mmol/L   Glucose, Bld 122 (H) 70 - 99 mg/dL    Comment:  Glucose reference range applies only to samples taken after fasting for at least 8 hours.   BUN 6 (L) 8 - 23 mg/dL   Creatinine, Ser 0.57 0.44 - 1.00 mg/dL   Calcium 8.7 (L) 8.9 - 10.3 mg/dL   GFR, Estimated >60 >60 mL/min    Comment: (NOTE) Calculated using the CKD-EPI Creatinine Equation (2021)    Anion gap 7 5 - 15    Comment: Performed at [facility], [place] [address], [place], [place] [address]    MICRO: Outside office reported staph mutans-pansensitive IMAGING: [place] EKG SITE RITE  Result Date: 09/14/2020 If Site Rite image not attached, placement could not be confirmed due to current cardiac rhythm.    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 Will do test dose of amoxicillin under observation to see if still has PCN allergy  Early diverticulitis = it is unclear if her symptoms were prelude to her knee infection vs. Separate episode of diverticulitis. She appears improved. Will stop cipro and metronidazole.  Rtc in 3 wks and 6 wks.  Diagnosis: Pji of right knee  Culture Result: staph mutans  Allergies  Allergen Reactions   Crestor [Rosuvastatin] Other (See Comments)    Joint pain w/fibromyalgia   Pravachol [Pravastatin] Other (See Comments)    Joint pain w/fibromyalgia   Penicillins Swelling, Rash and Other (See Comments)    Hands and feet swell, rash; childhood allergy  Has patient had a PCN reaction causing immediate rash, facial/tongue/throat swelling, SOB or lightheadedness with hypotension: Unknown Has patient had a PCN reaction causing severe rash involving mucus membranes or skin  necrosis: Unknown Has patient had a PCN reaction that required hospitalization: Yes Has patient had a PCN reaction occurring within the last 10 years: No If all of the above answers are "NO", then may proceed with Cephalosporin use.    ------------------------------- OPAT Orders Discharge antibiotics to be given via PICC line Discharge antibiotics: Per pharmacy protocol cefazolin 2gm iv q 8hr plus oral rifampin 3078mbid Duration: 6 wk End Date: 10/27/20  [facility]:  Home health RN for IV administration and teaching; PICC line care and labs.    Labs weekly while on IV antibiotics: __ x CBC with differential  __x CMP __x CRP __x ESR   _x_ Please pull PIC at completion of IV antibiotics   Fax weekly labs to ([phone] Follow Up Appt: 7/11  @ [facility] with dr Kelisha Dall

## 2020-09-15 NOTE — Progress Notes (Signed)
Physical Therapy Treatment Patient Details Name: Crystal Russell MRN: [id number] DOB: [birth year] Today's Date: 09/15/2020    History of Present Illness Pt s/p I&D of R TKR  with poly exchange.    PT Comments    Pt with elevated anxiety this pm but able to participate with PT including up to ambulate in hall, negotiated stairs and reviewed limited HEP.  Follow Up Recommendations  Outpatient PT     Equipment Recommendations  None recommended by PT    Recommendations for Other Services       Precautions / Restrictions Precautions Precautions: Fall;Knee Restrictions Weight Bearing Restrictions: No Other Position/Activity Restrictions: WBAT    Mobility  Bed Mobility Overal bed mobility: Needs Assistance Bed Mobility: Supine to Sit;Sit to Supine     Supine to sit: Min guard Sit to supine: Min assist   General bed mobility comments: cues for sequence with min assist to manage R LE    Transfers Overall transfer level: Needs assistance Equipment used: Rolling walker (2 wheeled) Transfers: Sit to/from Stand Sit to Stand: Min guard         General transfer comment: cues for LE management and use of UEs to self assist  Ambulation/Gait Ambulation/Gait assistance: Min guard Gait Distance (Feet): 120 Feet Assistive device: Rolling walker (2 wheeled) Gait Pattern/deviations: Step-to pattern;Shuffle;Step-through pattern;Decreased step length - right;Decreased step length - left;Trunk flexed Gait velocity: decr   General Gait Details: cues for sequence, posture and position from RW   Stairs Stairs: Yes Stairs assistance: Min assist Stair Management: One rail Left;Step to pattern;Forwards;With cane Number of Stairs: 3 General stair comments: cues for sequence and foot/cane placement   Wheelchair Mobility    Modified Rankin (Stroke Patients Only)       Balance Overall balance assessment: Needs assistance Sitting-balance support: No upper extremity  supported;Feet supported Sitting balance-Leahy Scale: Good     Standing balance support: No upper extremity supported Standing balance-Leahy Scale: Fair                              Cognition Arousal/Alertness: Awake/alert Behavior During Therapy: WFL for tasks assessed/performed Overall Cognitive Status: Within Functional Limits for tasks assessed                                        Exercises      General Comments        Pertinent Vitals/Pain Pain Assessment: 0-10 Pain Score: 3  Pain Location: R knee Pain Descriptors / Indicators: Aching;Burning Pain Intervention(s): Monitored during session;Limited activity within patient's tolerance;Ice applied;Premedicated before session    Home Living                      Prior Function            PT Goals (current goals can now be found in the care plan section) Acute Rehab PT Goals Patient Stated Goal: Regain full use of R knee PT Goal Formulation: With patient Time For Goal Achievement: 09/22/20 Potential to Achieve Goals: Good Progress towards PT goals: Progressing toward goals    Frequency    7X/week      PT Plan Current plan remains appropriate    Co-evaluation              AM-PAC PT "6 Clicks" Mobility   Outcome Measure  Help needed  turning from your back to your side while in a flat bed without using bedrails?: A Little Help needed moving from lying on your back to sitting on the side of a flat bed without using bedrails?: A Little Help needed moving to and from a bed to a chair (including a wheelchair)?: A Little Help needed standing up from a chair using your arms (e.g., wheelchair or bedside chair)?: A Little Help needed to walk in hospital room?: A Little Help needed climbing 3-5 steps with a railing? : A Little 6 Click Score: 18    End of Session Equipment Utilized During Treatment: Gait belt Activity Tolerance: Patient tolerated treatment well Patient  left: with call bell/phone within reach;with family/visitor present;in bed Nurse Communication: Mobility status PT Visit Diagnosis: Difficulty in walking, not elsewhere classified (R26.2)     Time: 1519-1600 PT Time Calculation (min) (ACUTE ONLY): 41 min  Charges:  $Gait Training: 8-22 mins $Therapeutic Activity: 8-22 mins                     [place] Pager [phone] Office [phone]    Aashika Carta 09/15/2020, 5:03 PM

## 2020-09-15 NOTE — Evaluation (Signed)
Physical Therapy Evaluation Patient Details Name: Crystal Russell MRN: [id number] DOB: 1951/02/14 Today's Date: 09/15/2020   History of Present Illness  Pt s/p I&D of R TKR  with poly exchange.  Clinical Impression  Pt admitted as above and presenting with functional mobility limitations 2* decreased R LE strength/ROM and post op pain.  Pt should progress to dc home with family assist and requests follow up PT as OP.    Follow Up Recommendations Outpatient PT    Equipment Recommendations  None recommended by PT    Recommendations for Other Services       Precautions / Restrictions Precautions Precautions: Fall;Knee Restrictions Weight Bearing Restrictions: No Other Position/Activity Restrictions: WBAT      Mobility  Bed Mobility Overal bed mobility: Needs Assistance Bed Mobility: Supine to Sit     Supine to sit: Min assist     General bed mobility comments: cues for sequence with min assist to manage R LE    Transfers Overall transfer level: Needs assistance Equipment used: Rolling walker (2 wheeled) Transfers: Sit to/from Stand Sit to Stand: Min assist         General transfer comment: cues for LE management and use of UEs to self assist  Ambulation/Gait Ambulation/Gait assistance: Min assist;Min guard Gait Distance (Feet): 150 Feet Assistive device: Rolling walker (2 wheeled) Gait Pattern/deviations: Step-to pattern;Shuffle;Step-through pattern;Decreased step length - right;Decreased step length - left;Trunk flexed Gait velocity: decr   General Gait Details: cues for sequence, posture and position from [employer] Mobility    Modified Rankin (Stroke Patients Only)       Balance Overall balance assessment: Needs assistance Sitting-balance support: No upper extremity supported;Feet supported Sitting balance-Leahy Scale: Good     Standing balance support: No upper extremity supported Standing balance-Leahy Scale: Fair                                Pertinent Vitals/Pain Pain Assessment: 0-10 Pain Score: 3  Pain Location: R knee Pain Descriptors / Indicators: Aching;Burning Pain Intervention(s): Limited activity within patient's tolerance;Monitored during session;Premedicated before session;Ice applied    Home Living Family/patient expects to be discharged to:: Private residence Living Arrangements: Spouse/significant other;Children Available Help at Discharge: Family Type of Home: House Home Access: Stairs to enter Entrance Stairs-Rails: Right Entrance Stairs-Number of Steps: 3 Home Layout: One level Home Equipment: Environmental consultant - 2 wheels;Cane - single point;Bedside commode      Prior Function Level of Independence: Independent               Hand Dominance        Extremity/Trunk Assessment   Upper Extremity Assessment Upper Extremity Assessment: Overall WFL for tasks assessed    Lower Extremity Assessment Lower Extremity Assessment: RLE deficits/detail RLE Deficits / Details: AAROM at knee -5 - 40 with ++ guarding;  3-/5 quads       Communication   Communication: No difficulties  Cognition Arousal/Alertness: Awake/alert Behavior During Therapy: WFL for tasks assessed/performed Overall Cognitive Status: Within Functional Limits for tasks assessed                                        General Comments      Exercises Total Joint Exercises Ankle Circles/Pumps: AROM;Both;15 reps;Supine Quad Sets: AROM;Both;10 reps;Supine Heel  Slides: AAROM;15 reps;Supine;Right Straight Leg Raises: AAROM;Right;10 reps;Supine   Assessment/Plan    PT Assessment Patient needs continued PT services  PT Problem List Decreased strength;Decreased range of motion;Decreased activity tolerance;Decreased balance;Decreased mobility;Decreased knowledge of use of DME;Pain       PT Treatment Interventions DME instruction;Gait training;Stair training;Functional mobility  training;Therapeutic activities;Therapeutic exercise;Patient/family education    PT Goals (Current goals can be found in the Care Plan section)  Acute Rehab PT Goals Patient Stated Goal: Regain full use of R knee PT Goal Formulation: With patient Time For Goal Achievement: 09/22/20 Potential to Achieve Goals: Good    Frequency 7X/week   Barriers to discharge        Co-evaluation               AM-PAC PT "6 Clicks" Mobility  Outcome Measure Help needed turning from your back to your side while in a flat bed without using bedrails?: A Little Help needed moving from lying on your back to sitting on the side of a flat bed without using bedrails?: A Little Help needed moving to and from a bed to a chair (including a wheelchair)?: A Little Help needed standing up from a chair using your arms (e.g., wheelchair or bedside chair)?: A Little Help needed to walk in hospital room?: A Little Help needed climbing 3-5 steps with a railing? : A Little 6 Click Score: 18    End of Session Equipment Utilized During Treatment: Gait belt Activity Tolerance: Patient tolerated treatment well Patient left: in chair;with call bell/phone within reach;with family/visitor present Nurse Communication: Mobility status PT Visit Diagnosis: Difficulty in walking, not elsewhere classified (R26.2)    Time: 7902-4097 PT Time Calculation (min) (ACUTE ONLY): 39 min   Charges:   PT Evaluation $PT Eval Low Complexity: 1 Low PT Treatments $Gait Training: 8-22 mins $Therapeutic Exercise: 8-22 mins        [place] Pager [phone] Office [phone]   Danisa Kopec 09/15/2020, 1:00 PM

## 2020-09-15 NOTE — Op Note (Signed)
NAMEABISAI, COBLE MEDICAL RECORD NO: [id number] ACCOUNT NO: [id number] DATE OF BIRTH: 07-10-1950 FACILITY: [facility] PHYSICIAN: Pietro Cassis. Alvan Dame, MD  Operative Report   DATE OF PROCEDURE: 09/14/2020  PREOPERATIVE DIAGNOSIS:  Acute hematogenous infection of a right total knee replacement.  POSTOPERATIVE DIAGNOSIS:  Acute hematogenous infection of a right total knee replacement.  PROCEDURE: 1.  Open excisional and non-excisional debridement of right knee. 2.  Polyethylene revision with a size 5 mm insert to match a size 6 Attune femoral component.   SURGEON:  Paralee Cancel, MD  ASSISTANT:  Costella Hatcher, PA-C.  Note that Ms. Lu Duffel was present for the entirety of the case from preoperative positioning, perioperative management of the operative extremity, general facilitation of the case and primary wound closure.  ANESTHESIA:  Regional plus spinal.  SPECIMENS:  None was taken as preoperative aspiration in the office was already obtained with positive culture results.  DRAINS:  None.  TOURNIQUET:  Tourniquet was utilized for a total of 29 minutes at 250 mmHg.  BLOOD LOSS:  Less than 100 mL.  INDICATIONS:  The patient is a 70 year old female with history of a right total knee replacement 3 years ago.  Overall, she had done well, though she struggled with her initial recovery getting her motion back.  She noted that she had been performing  normal activities of daily living without problems.  She was walking 10-15 thousand steps a day.  She presented to the office last week with acute onset of right knee pain.  It presented oddly to her as she was sitting in a chair and stood and had some  pain and popping and then subsequently had pain and swelling.  She was seen in the office last Friday, at which point, an aspiration was performed.  The aspiration results came back positive.  Her clinical presentation was concerning for infection based  on low grade fever, warmth and  erythema.  Based on the acuity of her infection on presentation, I felt this represented an acute hematogenous presentation for infection.  I discussed with her the options of polyethylene exchange with retention of her  implants versus resection and two stage treatment.  We opted based on the acuity to proceed with a debridement and retention of implant procedure with I and D and polyethylene exchange.  The risks and data available with percentages for potential cure  were discussed.  The postoperative course including IV antibiotics for 6 weeks followed by suppressive antibiotics for 3-6 months was reviewed.  Consent was obtained for management of this.  DESCRIPTION OF PROCEDURE:  The patient was brought to the operative theater.  Once adequate anesthesia, preoperative antibiotics were administered which included vancomycin in addition to Ancef as we did not know her antibiotic specificities at that  time.  She was positioned with a right thigh tourniquet placed.  The right lower extremity was then prepped and draped in sterile fashion.  A timeout was performed identifying the patient, planned procedure, and extremity.  Leg was exsanguinated,  tourniquet elevated to 250 mmHg.  A scalpel was used to excise her incision.  Soft tissue planes were created.  A median arthrotomy was made, encountering clearly inflammatory tissue, but not obvious purulence.  At this point, the procedure was carried out as an excisional debridement,  using the Bovie cautery, I performed a radical excisional debridement of her soft tissues in the medial and lateral gutters as well as suprapatellar pouch.  All nonviable scar tissue was excised.  This  included the peripatellar region.  I did not  identify any loosening of the tibial, femoral or patellar components.  Once I was able to adequately debride these portions of the procedure, I removed her old polyethylene.  This allowed for sharp debridement and excision of nonviable  tissue  posteriorly.  Once this excisional debridement was carried out, we irrigated the knee in series with a combination of normal saline solution as well as Betadine based solution, which was kept in the wound for approximately 2 minutes.  This was then  washed out and then the knee irrigated with chlorhexidine based fluid, which was kept in the wound for 5 minutes.  Once this was completed, I reirrigated the knee with 3 liters of normal saline solution with pulse lavage.  Once we completed these  procedures, our gloves were changed and a new polyethylene insert was opened and placed into the knee.  The knee was subsequently reduced.  The tourniquet was let down without significant hemostasis required.  I sprinkled 1 gram of vancomycin into the  deep tissues.  The extensor mechanism was reapproximated using #1 Vicryl and #1 Stratafix suture.  A small remaining portion of the vancomycin powder was sprinkled into the superficial layer.  The remainder of the wound was then closed with 2-0 Vicryl  and a running Monocryl stitch.  The knee was cleaned, dried and dressed sterilely using surgical glue and Aquacel dressing.  She was then brought to the recovery room in stable condition, tolerating the procedure well.  Postoperatively, she will be admitted to the hospital where we will have a PICC line placed.  Infectious disease will be consulted.  We will evaluate our results from our aspiration and provide them to our colleagues.  Discharge will be performed when  she is up and moving around, stable with physical therapy, pain well controlled as well as arrangements for antibiotic treatment.   SHW D: 09/15/2020 3:34:56 pm T: 09/15/2020 9:37:00 pm  JOB: [id number]/ [id number]

## 2020-09-15 NOTE — TOC Initial Note (Signed)
Transition of Care [facility]) - Initial/Assessment Note   Patient Details  Name: Crystal Russell MRN: [id number] Date of Birth: 1951/02/26  Transition of Care [facility] CM/SW Contact:    Sherie Don, LCSW Phone Number: 09/15/2020, 3:46 PM  Clinical Narrative: Patient will need to discharge home with IV antibiotics. OPAT orders have been signed. Pam with [employer] has set up [facility] through [place]. TOC to follow.  Expected Discharge Plan: [place] Barriers to Discharge: Continued Medical Work up  Patient Goals and CMS Choice Patient states their goals for this hospitalization and ongoing recovery are:: Return home CMS Medicare.gov Compare Post Acute Care list provided to:: Patient Choice offered to / list presented to : Patient  Expected Discharge Plan and Services Expected Discharge Plan: [place] In-house Referral: Clinical Social Work Post Acute Care Choice: [place] arrangements for the past 2 months: Single Family Home              DME Arranged: N/A DME Agency: NA HH Arranged: RN [place] Agency: [place] Representative spoke with at [place]: Set up by Pam through [employer]  Prior Living Arrangements/Services Living arrangements for the past 2 months: [place] Lives with:: Spouse Patient language and need for interpreter reviewed:: Yes Do you feel safe going back to the place where you live?: Yes      Need for Family Participation in Patient Care: No (Comment) Care giver support system in place?: Yes (comment) Criminal Activity/Legal Involvement Pertinent to Current Situation/Hospitalization: No - Comment as needed  Activities of Daily Living Home Assistive Devices/Equipment: Eyeglasses, Walker (specify type) ADL Screening (condition at time of admission) Patient's cognitive ability adequate to safely complete daily activities?: Yes Is the patient deaf or have difficulty hearing?: No Does the patient have difficulty  seeing, even when wearing glasses/contacts?: No Does the patient have difficulty concentrating, remembering, or making decisions?: No Patient able to express need for assistance with ADLs?: Yes Does the patient have difficulty dressing or bathing?: No Independently performs ADLs?: Yes (appropriate for developmental age) Does the patient have difficulty walking or climbing stairs?: Yes Weakness of Legs: Right Weakness of Arms/Hands: None  Emotional Assessment Alcohol / Substance Use: Not Applicable Psych Involvement: No (comment)  Admission diagnosis:  Infection of prosthetic right knee joint ([place]) [T84.53XA] Patient Active Problem List   Diagnosis Date Noted   Infection of prosthetic right knee joint ([place]) 09/14/2020   PAC (premature atrial contraction) 07/28/2020   PAF (paroxysmal atrial fibrillation) ([place]) 05/01/2020   Mild aortic valve regurgitation 05/01/2020   Mild mitral regurgitation 05/01/2020   PFO (patent foramen ovale) 05/01/2020   Laceration of skin of right hand 03/19/2020   Diverticulitis 03/16/2020   Hyperglycemia 03/16/2020   Vertigo 09/14/2019   Status post total right knee replacement 07/02/2017   S/P right TKA 07/01/2017   Right knee pain 06/16/2017   Postmenopausal bleeding 12/06/2015   Varicose veins of bilateral lower extremities with other complications 98/92/1194   Sun-damaged skin 12/04/2014   Breast mass, left 05/16/2014   Varicose veins of lower extremities with other complications 17/40/8144   Preventative health care 08/08/2013   Hypothyroidism 08/08/2013   Atrophic vaginitis 08/08/2013   Cervical cancer screening 08/05/2013   ETD (eustachian tube dysfunction) 05/26/2013   Depression with anxiety 12/27/2012   Ventral hernia 08/05/2012   Varicose veins 08/05/2012   DJD (degenerative joint disease) 08/05/2012   Constipation 08/05/2012   Hyperlipidemia, mixed 08/05/2012   H/O gestational  diabetes mellitus, not currently pregnant 08/05/2012    Chicken pox    Shingles    Paroxysmal A-fib (HCC)    Heart murmur    Spondylisthesis    PCP:  Mosie Lukes, MD Pharmacy:   [facility] [id number] [place] [address] [place] [address] Phone: [phone] Fax: [phone]  [id number] [address] [place] [address] Phone: [phone] Fax: [phone]  Readmission Risk Interventions No flowsheet data found.

## 2020-09-15 NOTE — Progress Notes (Signed)
Subjective: 1 Day Post-Op Procedure(s) (LRB): Irrigation and debriement with poly exchange versus resection of right total knee arthroplasty and placement of antibitiotic spacer (Right) Patient reports pain as moderate.   Reports having some issues with her heart rate at night - bradycardia but this is known to her  Objective: Vital signs in last 24 hours: Temp:  [97.5 F (36.4 C)-98.6 F (37 C)] 97.5 F (36.4 C) (06/10 0546) Pulse Rate:  [44-65] 44 (06/10 0546) Resp:  [11-17] 17 (06/10 0546) BP: (106-147)/(58-77) 121/68 (06/10 0546) SpO2:  [96 %-100 %] 100 % (06/10 0546) Weight:  [58.2 kg-58.5 kg] 58.2 kg (06/09 1010)  Intake/Output from previous day: 06/09 0701 - 06/10 0700 In: 2378 [P.O.:240; I.V.:1738; IV Piggyback:400] Out: 2450 [Urine:2350; Blood:100] Intake/Output this shift: No intake/output data recorded.  Recent Labs    09/14/20 1015 09/15/20 0335  HGB 13.4 11.0*   Recent Labs    09/14/20 1015 09/15/20 0335  WBC 5.6 6.1  RBC 4.63 3.79*  HCT 40.5 33.4*  PLT 323 279   Recent Labs    09/14/20 1015 09/15/20 0335  NA 134* 133*  K 3.8 4.3  CL 99 100  CO2 27 26  BUN 7* 6*  CREATININE 0.56 0.57  GLUCOSE 101* 122*  CALCIUM 9.5 8.7*   No results for input(s): LABPT, INR in the last 72 hours.  Neurovascular intact Incision: dressing C/D/I   Assessment/Plan: 1 Day Post-Op Procedure(s) (LRB): Irrigation and debriement with poly exchange versus resection of right total knee arthroplasty and placement of antibitiotic spacer (Right) Advance diet Up with therapy Continue ABX therapy due to Culture taken of surgical site during joint revision PIC line ordered for continuous antibiotics ID consult to be placed  We obtained an aspiration from our office prior to hospitalization and will try to relay results to ID Depending on above discharge likely this afternoon versus tomorrow depending on mobility, pain control and arrangement of her post op  needs   Anticipated LOS equal to or greater than 2 midnights due to - Age 70 and older with one or more of the following:  - Obesity  - Expected need for hospital services (PT, OT, Nursing) required for safe  discharge  - Anticipated need for postoperative skilled nursing care or inpatient rehab  - Active co-morbidities: None OR   - Unanticipated findings during/Post Surgery: Infected knee joint or operative site  - Patient is a high risk of re-admission due to: None   Mauri Pole 09/15/2020, 7:40 AM

## 2020-09-16 LAB — CBC
HCT: 33.7 % — ABNORMAL LOW (ref 36.0–46.0)
Hemoglobin: 11 g/dL — ABNORMAL LOW (ref 12.0–15.0)
MCH: 28.7 pg (ref 26.0–34.0)
MCHC: 32.6 g/dL (ref 30.0–36.0)
MCV: 88 fL (ref 80.0–100.0)
Platelets: 313 10*3/uL (ref 150–400)
RBC: 3.83 MIL/uL — ABNORMAL LOW (ref 3.87–5.11)
RDW: 12.1 % (ref 11.5–15.5)
WBC: 6.5 10*3/uL (ref 4.0–10.5)
nRBC: 0 % (ref 0.0–0.2)

## 2020-09-16 MED ORDER — HEPARIN SOD (PORK) LOCK FLUSH 100 UNIT/ML IV SOLN
250.0000 [IU] | INTRAVENOUS | Status: AC | PRN
  Administered 2020-09-16: 250 [IU]
  Filled 2020-09-16: qty 2.5

## 2020-09-16 MED ORDER — METHOCARBAMOL 500 MG PO TABS: 500.0000 mg | ORAL_TABLET | Freq: Three times a day (TID) | ORAL | 1 refills | Status: DC | PRN

## 2020-09-16 MED ORDER — HYDROCODONE-ACETAMINOPHEN 5-325 MG PO TABS: 1.0000 | ORAL_TABLET | ORAL | 0 refills | Status: DC | PRN

## 2020-09-16 NOTE — Progress Notes (Signed)
Orthopedics Progress Note  Subjective: Patient improved today. She is nervous but ready to go home and has good family support  Objective:  Vitals:   09/16/20 0551 09/16/20 1046  BP: 135/77 118/84  Pulse: 61 63  Resp: 16 16  Temp: 98.2 F (36.8 C) 98.2 F (36.8 C)  SpO2: 99% 100%    General: Awake and alert  Musculoskeletal: Right knee with Aquacel dressing in place, no signs for infection, No drainage and no erythema. Neg [place]. No pain with active ankle pumps Neurovascularly intact  Lab Results  Component Value Date   WBC 6.5 09/16/2020   HGB 11.0 (L) 09/16/2020   HCT 33.7 (L) 09/16/2020   MCV 88.0 09/16/2020   PLT 313 09/16/2020       Component Value Date/Time   NA 133 (L) 09/15/2020 0335   K 4.3 09/15/2020 0335   CL 100 09/15/2020 0335   CO2 26 09/15/2020 0335   GLUCOSE 122 (H) 09/15/2020 0335   BUN 6 (L) 09/15/2020 0335   CREATININE 0.57 09/15/2020 0335   CREATININE 0.70 08/05/2013 1003   CALCIUM 8.7 (L) 09/15/2020 0335   GFRNONAA >60 09/15/2020 0335   GFRAA >60 09/19/2019 1518    No results found for: INR, PROTIME  Assessment/Plan: POD #2 s/p Procedure(s): Irrigation and debriement with poly exchange versus resection of right total knee arthroplasty and placement of antibitiotic spacer Stable this morning for discharge to home. Home IV abx arranged and other discharge plans per Dr Nance Pew R. Veverly Fells, MD 09/16/2020 10:56 AM

## 2020-09-16 NOTE — Discharge Summary (Signed)
In most cases prophylactic antibiotics for Dental procdeures after total joint surgery are not necessary.  Exceptions are as follows:  1. History of prior total joint infection  2. Severely immunocompromised (Organ Transplant, cancer chemotherapy, Rheumatoid biologic meds such as [place])  3. Poorly controlled diabetes (A1C &gt; 8.0, blood glucose over 200)  If you have one of these conditions, contact your surgeon for an antibiotic prescription, prior to your dental procedure. Orthopedic Discharge Summary        Physician Discharge Summary  Patient ID: Crystal Russell MRN: [id number] DOB/AGE: 1950/10/07 70 y.o.  Admit date: 09/14/2020 Discharge date: 09/16/2020   Procedures:  Procedure(s) (LRB): Irrigation and debriement with poly exchange versus resection of right total knee arthroplasty and placement of antibitiotic spacer (Right)  Attending Physician:  Dr. Esmond Plants  Admission Diagnoses:   right total knee prosthetic infection  Discharge Diagnoses:  right knee I+D with spacer exchange for infected total knee   Past Medical History:  Diagnosis Date   Abdominal wall hernia 08/04/2012   Atrial fibrillation ([place]) 2008   REPORTS SHE WAS AT [place], UPON WAKING I FELT MY HEART GOING BOOM BOOM BOOM BEATING OUT OF MY CHEST. I WENT TO HOSPITAL AND THEY ISED MEDICINE TO CONVERT ME , IT TOOK OVER 10 HOURS TO CONVERT ME. BUT AFTER THAT IVE NEVER HAD ANY ISSUES SINCE. I USED TO HAVE A CARDIOLOGUIST AS MY PRIMARY IN [place] BUT IHAVENT BEEN TO A CARDIOLOGIST SINCE I MOVED DOWN HERE.    Chicken pox as a child   Depression    Depression with anxiety 12/27/2012   Diverticulitis    DJD (degenerative joint disease) 08/05/2012   DVT (deep venous thrombosis) ([place]) 2018   Fifth disease 1989   DX AFTER EXPOSURE FROM HER CHILDREN; DEVELOPED RHEUMATOID ARTHRITIS DURING COURSE OF ILLNESS. REPORTS:  " I DONT HAVE IT ANYMORE SINCE THE DISEASE IS GONE"    H/O gestational diabetes mellitus, not  currently pregnant 08/05/2012   History of    Heart murmur    echo 2012; "I HAVE A FUNCTIONAL MURMUR"    Hyperlipidemia    Hypothyroidism    Knee effusion, right    while on Eliquis, discontinued Eliquis after DVT resolved   Measles as a child   Migraines    Other and unspecified hyperlipidemia 08/05/2012   Paroxysmal A-fib ([place])    Shingles 43   Spondylisthesis 2012   L5/S1 grade 3   Varicose veins 08/05/2012   Extending up to right groin B/l LE   Ventral hernia 08/05/2012   Wears glasses     PCP: Mosie Lukes, MD   Discharged Condition: good  Hospital Course:  Patient underwent the above stated procedure on 09/14/2020. Patient tolerated the procedure well and brought to the recovery room in good condition and subsequently to the floor. Patient had an uncomplicated hospital course and was stable for discharge.   Disposition:  with follow up in 2 weeks    Follow-up Information     Paralee Cancel, MD. Schedule an appointment as soon as possible for a visit in 2 week(s).   Specialty: Orthopedic Surgery Contact information: [address] [place] [address] [phone]                 Dental Antibiotics:  In most cases prophylactic antibiotics for Dental procdeures after total joint surgery are not necessary.  Exceptions are as follows:  1. History of prior total joint infection  2. Severely immunocompromised (  Organ Transplant, cancer chemotherapy, Rheumatoid biologic meds such as [place])  3. Poorly controlled diabetes (A1C &gt; 8.0, blood glucose over 200)  If you have one of these conditions, contact your surgeon for an antibiotic prescription, prior to your dental procedure.  Discharge Instructions     [facility] pharmacist to adjust dose for Vancomycin, Aminoglycosides and other anti-infective therapies as requested by physician.   Complete by: As directed    [facility] to provide Cath Flo 55m   Complete by:  As directed    Administer for PICC line occlusion and as ordered by physician for other access device issues.   Anaphylaxis Kit: Provided to treat any anaphylactic reaction to the medication being provided to the patient if First Dose or when requested by physician   Complete by: As directed    Epinephrine 178mml vial / amp: Administer 0.44m39m0.44ml40mubcutaneously once for moderate to severe anaphylaxis, nurse to call physician and pharmacy when reaction occurs and call 911 if needed for immediate care   Diphenhydramine 50mg71mIV vial: Administer 25-50mg 10mM PRN for first dose reaction, rash, itching, mild reaction, nurse to call physician and pharmacy when reaction occurs   Sodium Chloride 0.9% NS 500ml I29mdminister if needed for hypovolemic blood pressure drop or as ordered by physician after call to physician with anaphylactic reaction   Change dressing on IV access line weekly and PRN   Complete by: As directed    Flush IV access with Sodium Chloride 0.9% and Heparin 10 units/ml or 100 units/ml   Complete by: As directed    Home infusion instructions - [facility]   Complete by: As directed    Instructions: Flush IV access with Sodium Chloride 0.9% and Heparin 10units/ml or 100units/ml   Change dressing on IV access line: Weekly and PRN   Instructions Cath Flo 2mg: Ad27mister for PICC Line occlusion and as ordered by physician for other access device   [facility] pharmacist to adjust dose for: Vancomycin, Aminoglycosides and other anti-infective therapies as requested by physician   Method of administration may be changed at the discretion of home infusion pharmacist based upon assessment of the patient and/or caregiver's ability to self-administer the medication ordered   Complete by: As directed        Allergies as of 09/16/2020       Reactions   Crestor [rosuvastatin] Other (See Comments)   Joint pain w/fibromyalgia   Pravachol [pravastatin] Other (See  Comments)   Joint pain w/fibromyalgia   Penicillins Swelling, Rash, Other (See Comments)   Hands and feet swell, rash; childhood allergy  Has patient had a PCN reaction causing immediate rash, facial/tongue/throat swelling, SOB or lightheadedness with hypotension: Unknown Has patient had a PCN reaction causing severe rash involving mucus membranes or skin necrosis: Unknown Has patient had a PCN reaction that required hospitalization: Yes Has patient had a PCN reaction occurring within the last 10 years: No If all of the above answers are "NO", then may proceed with Cephalosporin use.        Medication List     TAKE these medications    acetaminophen 500 MG tablet Commonly known as: TYLENOL Take 1,000 mg by mouth in the morning and at bedtime.   aspirin EC 81 MG tablet Take 81 mg by mouth daily. Swallow whole.   atorvastatin 10 MG tablet Commonly known as: LIPITOR Take 0.5 tablets (5 mg total) by mouth daily. What changed: when to take this  ceFAZolin  IVPB Commonly known as: ANCEF Inject 2 g into the vein every 8 (eight) hours. Indication:  PJI First Dose: No Last Day of Therapy:  10/27/2020 Labs - Once weekly:  CBC/D and BMP, Labs - Every other week:  ESR and CRP Method of administration: IV Push Method of administration may be changed at the discretion of home infusion pharmacist based upon assessment of the patient and/or caregiver's ability to self-administer the medication ordered.   ciprofloxacin 500 MG tablet Commonly known as: CIPRO Take 1 tablet (500 mg total) by mouth 2 (two) times daily.   Coenzyme Q10 300 MG Caps Take 300 mg by mouth every evening.   HYDROcodone-acetaminophen 5-325 MG tablet Commonly known as: NORCO/VICODIN Take 1-2 tablets by mouth every 4 (four) hours as needed for moderate pain (pain score 4-6).   levothyroxine 100 MCG tablet Commonly known as: SYNTHROID Take 1 tablet (100 mcg total) by mouth daily before breakfast. What changed:  when to take this   methocarbamol 500 MG tablet Commonly known as: ROBAXIN Take 1 tablet (500 mg total) by mouth every 8 (eight) hours as needed for muscle spasms.   metroNIDAZOLE 500 MG tablet Commonly known as: FLAGYL Take 500 mg by mouth 2 (two) times daily.   PA Vitamin D-3 50 MCG (2000 UT) Caps Generic drug: Cholecalciferol Take 4,000 Units by mouth every evening.   polyethylene glycol 17 g packet Commonly known as: MIRALAX / GLYCOLAX Take 17 g by mouth 2 (two) times daily.   rifampin 300 MG capsule Commonly known as: Rifadin Take 1 capsule (300 mg total) by mouth 2 (two) times daily.   vitamin B-12 1000 MCG tablet Commonly known as: CYANOCOBALAMIN Take 1,000 mcg by mouth every evening.               Discharge Care Instructions  (From admission, onward)           Start     Ordered   09/15/20 0000  Change dressing on IV access line weekly and PRN  (Home infusion instructions - [facility] )        09/15/20 1509              Signed: Augustin Schooling 09/16/2020, 11:03 AM  [facility] [address], [place], [place], [place] [address] Phone: [place]

## 2020-09-16 NOTE — Progress Notes (Signed)
Patient discharged to home w/ husband. Given all belongings, instructions. Verbalized understanding of all instructions. Escorted to pov via w/c. 

## 2020-09-16 NOTE — Progress Notes (Signed)
Physical Therapy Treatment Patient Details Name: Crystal Russell MRN: [id number] DOB: 10/27/1950 Today's Date: 09/16/2020    History of Present Illness Pt s/p I&D of R TKR  with poly exchange.    PT Comments    Pt up to ambulate increased distance in hall and performed HEP with assist.  Pt looking forward to going home but not to 6 weeks dealing with picc line.  Follow Up Recommendations  Outpatient PT     Equipment Recommendations  None recommended by PT    Recommendations for Other Services       Precautions / Restrictions Precautions Precautions: Fall;Knee Restrictions Weight Bearing Restrictions: No Other Position/Activity Restrictions: WBAT    Mobility  Bed Mobility               General bed mobility comments: Pt up in chair and requests back to same    Transfers Overall transfer level: Needs assistance Equipment used: Rolling walker (2 wheeled) Transfers: Sit to/from Stand Sit to Stand: Min guard;Supervision         General transfer comment: cues for LE management and use of UEs to self assist  Ambulation/Gait Ambulation/Gait assistance: Min guard;Supervision Gait Distance (Feet): 200 Feet Assistive device: Rolling walker (2 wheeled) Gait Pattern/deviations: Step-to pattern;Shuffle;Step-through pattern;Decreased step length - right;Decreased step length - left;Trunk flexed     General Gait Details: cues for posture and position from [employer]         General stair comments: pt states feels comfortable with ability to negotiate stairs based on performance yesterday   Wheelchair Mobility    Modified Rankin (Stroke Patients Only)       Balance Overall balance assessment: Needs assistance Sitting-balance support: No upper extremity supported;Feet supported Sitting balance-Leahy Scale: Good     Standing balance support: No upper extremity supported Standing balance-Leahy Scale: Fair                               Cognition Arousal/Alertness: Awake/alert Behavior During Therapy: WFL for tasks assessed/performed Overall Cognitive Status: Within Functional Limits for tasks assessed                                        Exercises Total Joint Exercises Ankle Circles/Pumps: AROM;Both;15 reps;Supine Quad Sets: AROM;Both;10 reps;Supine Heel Slides: AAROM;Supine;Right;20 reps Hip ABduction/ADduction: AAROM;AROM;Right;15 reps;Supine Straight Leg Raises: Right;Supine;AAROM;AROM;20 reps    General Comments        Pertinent Vitals/Pain Pain Assessment: 0-10 Pain Score: 3  Pain Location: R knee Pain Descriptors / Indicators: Aching;Burning Pain Intervention(s): Limited activity within patient's tolerance;Monitored during session;Premedicated before session;Ice applied    Home Living                      Prior Function            PT Goals (current goals can now be found in the care plan section) Acute Rehab PT Goals Patient Stated Goal: Regain full use of R knee PT Goal Formulation: With patient Time For Goal Achievement: 09/22/20 Potential to Achieve Goals: Good Progress towards PT goals: Progressing toward goals    Frequency    7X/week      PT Plan Current plan remains appropriate    Co-evaluation              AM-PAC PT "6 Clicks" Mobility  Outcome Measure  Help needed turning from your back to your side while in a flat bed without using bedrails?: A Little Help needed moving from lying on your back to sitting on the side of a flat bed without using bedrails?: A Little Help needed moving to and from a bed to a chair (including a wheelchair)?: A Little Help needed standing up from a chair using your arms (e.g., wheelchair or bedside chair)?: A Little Help needed to walk in hospital room?: A Little Help needed climbing 3-5 steps with a railing? : A Little 6 Click Score: 18    End of Session Equipment Utilized During Treatment: Gait  belt Activity Tolerance: Patient tolerated treatment well Patient left: with call bell/phone within reach;with family/visitor present;in chair Nurse Communication: Mobility status PT Visit Diagnosis: Difficulty in walking, not elsewhere classified (R26.2)     Time: 4436-0165 PT Time Calculation (min) (ACUTE ONLY): 27 min  Charges:  $Gait Training: 8-22 mins $Therapeutic Exercise: 8-22 mins                     [place] Pager [phone] Office [phone]    Leianne Callins 09/16/2020, 1:09 PM

## 2020-09-17 DIAGNOSIS — I491 Atrial premature depolarization: Secondary | ICD-10-CM | POA: Diagnosis not present

## 2020-09-17 DIAGNOSIS — Q211 Atrial septal defect: Secondary | ICD-10-CM | POA: Diagnosis not present

## 2020-09-17 DIAGNOSIS — I080 Rheumatic disorders of both mitral and aortic valves: Secondary | ICD-10-CM | POA: Diagnosis not present

## 2020-09-17 DIAGNOSIS — E782 Mixed hyperlipidemia: Secondary | ICD-10-CM | POA: Diagnosis not present

## 2020-09-17 DIAGNOSIS — Z87891 Personal history of nicotine dependence: Secondary | ICD-10-CM | POA: Diagnosis not present

## 2020-09-17 DIAGNOSIS — I83893 Varicose veins of bilateral lower extremities with other complications: Secondary | ICD-10-CM | POA: Diagnosis not present

## 2020-09-17 DIAGNOSIS — I480 Paroxysmal atrial fibrillation: Secondary | ICD-10-CM | POA: Diagnosis not present

## 2020-09-17 DIAGNOSIS — Z452 Encounter for adjustment and management of vascular access device: Secondary | ICD-10-CM | POA: Diagnosis not present

## 2020-09-17 DIAGNOSIS — Z9181 History of falling: Secondary | ICD-10-CM | POA: Diagnosis not present

## 2020-09-17 DIAGNOSIS — Z86718 Personal history of other venous thrombosis and embolism: Secondary | ICD-10-CM | POA: Diagnosis not present

## 2020-09-17 DIAGNOSIS — M4317 Spondylolisthesis, lumbosacral region: Secondary | ICD-10-CM | POA: Diagnosis not present

## 2020-09-17 DIAGNOSIS — E039 Hypothyroidism, unspecified: Secondary | ICD-10-CM | POA: Diagnosis not present

## 2020-09-17 DIAGNOSIS — E119 Type 2 diabetes mellitus without complications: Secondary | ICD-10-CM | POA: Diagnosis not present

## 2020-09-17 DIAGNOSIS — G43909 Migraine, unspecified, not intractable, without status migrainosus: Secondary | ICD-10-CM | POA: Diagnosis not present

## 2020-09-17 DIAGNOSIS — T8453XA Infection and inflammatory reaction due to internal right knee prosthesis, initial encounter: Secondary | ICD-10-CM | POA: Diagnosis not present

## 2020-09-17 DIAGNOSIS — Z6822 Body mass index (BMI) 22.0-22.9, adult: Secondary | ICD-10-CM | POA: Diagnosis not present

## 2020-09-17 DIAGNOSIS — Z7982 Long term (current) use of aspirin: Secondary | ICD-10-CM | POA: Diagnosis not present

## 2020-09-17 DIAGNOSIS — Z792 Long term (current) use of antibiotics: Secondary | ICD-10-CM | POA: Diagnosis not present

## 2020-09-17 DIAGNOSIS — E663 Overweight: Secondary | ICD-10-CM | POA: Diagnosis not present

## 2020-09-17 DIAGNOSIS — F418 Other specified anxiety disorders: Secondary | ICD-10-CM | POA: Diagnosis not present

## 2020-09-17 DIAGNOSIS — K439 Ventral hernia without obstruction or gangrene: Secondary | ICD-10-CM | POA: Diagnosis not present

## 2020-09-18 DIAGNOSIS — E782 Mixed hyperlipidemia: Secondary | ICD-10-CM | POA: Diagnosis not present

## 2020-09-18 DIAGNOSIS — F418 Other specified anxiety disorders: Secondary | ICD-10-CM | POA: Diagnosis not present

## 2020-09-18 DIAGNOSIS — I480 Paroxysmal atrial fibrillation: Secondary | ICD-10-CM | POA: Diagnosis not present

## 2020-09-18 DIAGNOSIS — E119 Type 2 diabetes mellitus without complications: Secondary | ICD-10-CM | POA: Diagnosis not present

## 2020-09-18 DIAGNOSIS — E039 Hypothyroidism, unspecified: Secondary | ICD-10-CM | POA: Diagnosis not present

## 2020-09-18 DIAGNOSIS — T8453XA Infection and inflammatory reaction due to internal right knee prosthesis, initial encounter: Secondary | ICD-10-CM | POA: Diagnosis not present

## 2020-09-19 ENCOUNTER — Telehealth: Payer: Self-pay

## 2020-09-19 ENCOUNTER — Telehealth: Payer: Self-pay | Admitting: Gastroenterology

## 2020-09-19 NOTE — Telephone Encounter (Signed)
Spoke with patient in regards to recommendations. She states that [place] drew a CBC w/ diff and BMET, she states that her labs were sent to [facility] but she has provided the nurse with our fax number. Patient denies any NSAID use at this time. Patient is aware of symptoms that will require ED evaluation. Pt is not currently on any PPI or antacid. Discussed that she can empirically take Omeprazole 20 mg daily OTC. Advised patient that once we have the lab work the provider with further advise. Patient verbalized understanding and had no concerns at the end of the call.

## 2020-09-19 NOTE — Telephone Encounter (Signed)
Patient returned call stating she isn't able to be seen by PCP until next Tuesday. Patient called her GI team for follow up as well. RN advised that if she develops weakness, general fatigue, color change to go to the emergency room. RN also advised that if she's as concerned as she is, to go to an urgent care for evaluation. Reviewed Hgb which was 11 (same as it was at discharge). Patient didn't think she could go to an UC with PICC line. RN advised that she can go about her daily activities with PICC line; offered suggestion of using a sock and cutting it to wear as a sleeve to cover the tubing. Patient verbalized understanding.   Dvontae Ruan Lorita Officer, RN

## 2020-09-19 NOTE — Telephone Encounter (Signed)
Patient called stating she is having dark stools, which she feels is coming from the Rifampin. Patient would also like to know if she could take Tylenol for pain. Patient was seen by our team while in the hospital. She is scheduled for a hospital follow up with Dr. Baxter Flattery on 10/16/20. Please advise. Allia Wiltsey T Brooks Sailors

## 2020-09-19 NOTE — Telephone Encounter (Signed)
Patient calling to inform she is having dark stools. Pt wants to know how to treat sxs..  Plz advise

## 2020-09-19 NOTE — Telephone Encounter (Signed)
Patient advised to reach out to her pcp regarding her having dark stools. Patient verbalized understanding. Ayan Heffington T Brooks Sailors

## 2020-09-19 NOTE — Telephone Encounter (Signed)
[place] thanks for the note.  She should come to the lab for CBC and BMET to make sure stable and trend where her blood counts are going if we can't get her lab results back soon, and not sure what labs they drew at her home? BMET would be useful to assess her BUN in this situation. A few days ago in the hospital her Hgb was stable at 11. If she is using any NSAIDs she should stop those.  We will see where her blood counts are and make recommendations based on that result.  If she is having obvious bleeding, significant pain abdominal pain or, shortness of breath, lightheadedness/dizziness, she should go to the emergency room, otherwise lets await her blood work to make recommendations on what to do acutely.  Not sure if she has any PPI or antacids that she takes routinely but can empirically take some omeprazole 20 mg/day over-the-counter until we sort this out.  Of note I am not in the office this afternoon, can you keep an eye out for her blood work if it comes back before the close of business and discuss with [facility] if any significant abnormalities.  Thanks

## 2020-09-19 NOTE — Telephone Encounter (Signed)
Spoke with patient, she states that she was in the hospital for an infection in her knee. Patient states that she noticed the dark stools at the hospital and when she left on Saturday. Pt describes stools as a dark brown diarrhea, pt states that they are so dark it almost looks black. No iron supplements or Pepto-bismol. Patient denies any BRB, lightheadedness or dizziness.She states that she is on Heparin through PICC line and aspirin daily. [facility] came yesterday and drew blood work, pt is still awaiting results. I have provided her with the fax number to have [facility] fax the results for Dr. Havery Moros to review.  Pt states that her Hgb was in the 11s when she left the hospital. Please advise, thanks.

## 2020-09-20 ENCOUNTER — Telehealth: Payer: Self-pay | Admitting: Gastroenterology

## 2020-09-20 DIAGNOSIS — R195 Other fecal abnormalities: Secondary | ICD-10-CM

## 2020-09-20 NOTE — Telephone Encounter (Signed)
Labs arrived:  Done 09/18/20  WBC 6.4, Hgb 12.0, MCV 85, plt 390  BUN 9, Cr 0.56   Crystal Russell can you please touch base with this patient and see how she is doing? She was concerned about having dark stools but her blood counts are normal, in fact Hgb higher than previous, and normal BUN argues against any sort of upper tract bleeding. Not sure if she is ingesting something otherwise to make her stools dark. If this persists we can recheck another CBC next week to make sure stable. She does need a colonoscopy once her knee has recovered, will consider EGD if dark stools persist but again seems very unlikely she is bleeding to cause this given normal Hgb. Thanks. Can you help schedule her a follow up visit with [place] otherwise? Can be with me or APP. Thanks

## 2020-09-20 NOTE — Telephone Encounter (Signed)
Spoke with patient in regards to results and recommendations. Patient states that she is feeling better today, no abdominal pain, no gurgling of the abdomen or gas. She continues to have dark loose stools. Patient states that it could be coming from the Rifampin that she is taking, as it can alter the color of her excretions. Advised patient that if dark stools persist we can repeat lab work next week. Patient is unsure of when they will clear her to have colonoscopy. She wanted to schedule her follow up after she completes antibiotics in mid July. Patient has been scheduled for a follow up with Dr. Havery Moros on Wednesday, 11/22/20 at 9:50 am. Patient states that she is very grateful to have someone looking out for her and how prompt we are, she states that she wished all offices were like ours.    Lab order and reminder in epic.

## 2020-09-20 NOTE — Telephone Encounter (Signed)
Okay sounds good, thanks for the follow up

## 2020-09-21 DIAGNOSIS — T8453XA Infection and inflammatory reaction due to internal right knee prosthesis, initial encounter: Secondary | ICD-10-CM | POA: Diagnosis not present

## 2020-09-21 DIAGNOSIS — F418 Other specified anxiety disorders: Secondary | ICD-10-CM | POA: Diagnosis not present

## 2020-09-21 DIAGNOSIS — E119 Type 2 diabetes mellitus without complications: Secondary | ICD-10-CM | POA: Diagnosis not present

## 2020-09-21 DIAGNOSIS — E039 Hypothyroidism, unspecified: Secondary | ICD-10-CM | POA: Diagnosis not present

## 2020-09-21 DIAGNOSIS — E782 Mixed hyperlipidemia: Secondary | ICD-10-CM | POA: Diagnosis not present

## 2020-09-21 DIAGNOSIS — I480 Paroxysmal atrial fibrillation: Secondary | ICD-10-CM | POA: Diagnosis not present

## 2020-09-23 ENCOUNTER — Other Ambulatory Visit: Payer: Self-pay | Admitting: Family Medicine

## 2020-09-25 ENCOUNTER — Ambulatory Visit: Payer: PRIVATE HEALTH INSURANCE | Admitting: Family Medicine

## 2020-09-25 DIAGNOSIS — E119 Type 2 diabetes mellitus without complications: Secondary | ICD-10-CM | POA: Diagnosis not present

## 2020-09-25 DIAGNOSIS — A419 Sepsis, unspecified organism: Secondary | ICD-10-CM | POA: Diagnosis not present

## 2020-09-25 DIAGNOSIS — T8453XA Infection and inflammatory reaction due to internal right knee prosthesis, initial encounter: Secondary | ICD-10-CM | POA: Diagnosis not present

## 2020-09-25 DIAGNOSIS — E782 Mixed hyperlipidemia: Secondary | ICD-10-CM | POA: Diagnosis not present

## 2020-09-25 DIAGNOSIS — F418 Other specified anxiety disorders: Secondary | ICD-10-CM | POA: Diagnosis not present

## 2020-09-25 DIAGNOSIS — E039 Hypothyroidism, unspecified: Secondary | ICD-10-CM | POA: Diagnosis not present

## 2020-09-25 DIAGNOSIS — I480 Paroxysmal atrial fibrillation: Secondary | ICD-10-CM | POA: Diagnosis not present

## 2020-09-25 LAB — BASIC METABOLIC PANEL
BUN: 11 (ref 4–21)
BUN: 13 (ref 4–21)
CO2: 23 — AB (ref 13–22)
Chloride: 96 — AB (ref 99–108)
Creatinine: 0.6 (ref 0.5–1.1)
Creatinine: 0.6 (ref 0.5–1.1)
Glucose: 106
Glucose: 86
Potassium: 4 (ref 3.4–5.3)
Potassium: 4.4 (ref 3.4–5.3)
Sodium: 131 — AB (ref 137–147)
Sodium: 138 (ref 137–147)

## 2020-09-25 LAB — CBC AND DIFFERENTIAL
HCT: 36 (ref 36–46)
Hemoglobin: 11.8 — AB (ref 12.0–16.0)
Neutrophils Absolute: 2.1
Platelets: 343 (ref 150–399)
WBC: 4.2

## 2020-09-25 LAB — COMPREHENSIVE METABOLIC PANEL: Calcium: 9 (ref 8.7–10.7)

## 2020-09-25 LAB — CBC: RBC: 4.09 (ref 3.87–5.11)

## 2020-09-25 LAB — POCT ERYTHROCYTE SEDIMENTATION RATE, NON-AUTOMATED: Sed Rate: 30

## 2020-09-26 ENCOUNTER — Other Ambulatory Visit: Payer: Self-pay

## 2020-09-26 ENCOUNTER — Ambulatory Visit (INDEPENDENT_AMBULATORY_CARE_PROVIDER_SITE_OTHER): Payer: Medicare Other | Admitting: Family Medicine

## 2020-09-26 ENCOUNTER — Encounter: Payer: Self-pay | Admitting: Family Medicine

## 2020-09-26 DIAGNOSIS — E039 Hypothyroidism, unspecified: Secondary | ICD-10-CM

## 2020-09-26 DIAGNOSIS — R739 Hyperglycemia, unspecified: Secondary | ICD-10-CM | POA: Diagnosis not present

## 2020-09-26 DIAGNOSIS — Z96651 Presence of right artificial knee joint: Secondary | ICD-10-CM

## 2020-09-26 DIAGNOSIS — M009 Pyogenic arthritis, unspecified: Secondary | ICD-10-CM | POA: Diagnosis not present

## 2020-09-26 NOTE — Progress Notes (Signed)
Patient ID: Crystal Russell, female    DOB: April 26, 1950  Age: 70 y.o. MRN: [id number]    Subjective:  Subjective  HPI [name] presents for office visit today for follow up on recent infection due to right knee replacement surgery and AFIB. She reports that she has had an acute infection to her right knee that was due to her right knee replacement surgery in 07/02/2017. She states that it was cleaned out during a recent appointment. She states that the day she found out it was an infection, her knee felt stiff and could not walk. She states that it was on a Friday. She denies any chest pain, SOB, abdominal pain, cough, chills, sore throat, dysuria, urinary incontinence, back pain, or N/VD.   She states that she does not like Rifampin because she has to fast 2-3 hours before taking it. She reports that her stool are lose and dark in color which she states might be either due to her recent knee surgery infection or a side effect of rifampin. She states having fevers of 101 degrees and experiencing severe HA's in the morning. She states that she takes 2 tylenols which helps alleviate the pain after eating. She reports that sometimes the HA's get severe enough that she experiences nausea, but states that it is not a migraine. According to her husband, he states that her stool is black and sticky. She reports having 2-3 times a day bowel movements   Heart rate feels pounding she feels aware of it. She states that she is also anxious. Stool according to husband is black and sticky. 2-3 times a day bowel movements that are gassy. She denies taking any probiotics, but states that she eats yogurt. She states that she does not eat enough food. She reports experiencing itchiness in her right arm due to her PICC line.   Review of Systems  Constitutional:  Positive for fatigue and fever (101 degrees). Negative for chills.       Reduced appetite  HENT:  Negative for congestion, rhinorrhea, sinus pressure, sinus  pain and sore throat.   Eyes:  Negative for pain.  Respiratory:  Negative for cough and shortness of breath.   Cardiovascular:  Negative for chest pain, palpitations and leg swelling.  Gastrointestinal:  Positive for diarrhea and nausea. Negative for abdominal pain, blood in stool and vomiting.  Genitourinary:  Negative for decreased urine volume, flank pain, frequency, vaginal bleeding and vaginal discharge.  Musculoskeletal:  Negative for back pain.  Neurological:  Positive for headaches.   History Past Medical History:  Diagnosis Date   Abdominal wall hernia 08/04/2012   Atrial fibrillation ([place]) 2008   REPORTS SHE WAS AT [place], UPON WAKING I FELT MY HEART GOING BOOM BOOM BOOM BEATING OUT OF MY CHEST. I WENT TO HOSPITAL AND THEY ISED MEDICINE TO CONVERT ME , IT TOOK OVER 10 HOURS TO CONVERT ME. BUT AFTER THAT IVE NEVER HAD ANY ISSUES SINCE. I USED TO HAVE A CARDIOLOGUIST AS MY PRIMARY IN [place] BUT IHAVENT BEEN TO A CARDIOLOGIST SINCE I MOVED DOWN HERE.    Chicken pox as a child   Depression    Depression with anxiety 12/27/2012   Diverticulitis    DJD (degenerative joint disease) 08/05/2012   DVT (deep venous thrombosis) ([place]) 2018   Fifth disease 1989   DX AFTER EXPOSURE FROM HER CHILDREN; DEVELOPED RHEUMATOID ARTHRITIS DURING COURSE OF ILLNESS. REPORTS:  " I DONT HAVE IT ANYMORE SINCE THE DISEASE IS GONE"  H/O gestational diabetes mellitus, not currently pregnant 08/05/2012   History of    Heart murmur    echo 2012; "I HAVE A FUNCTIONAL MURMUR"    Hyperlipidemia    Hypothyroidism    Knee effusion, right    while on Eliquis, discontinued Eliquis after DVT resolved   Measles as a child   Migraines    Other and unspecified hyperlipidemia 08/05/2012   Paroxysmal A-fib ([place])    Shingles 43   Spondylisthesis 2012   L5/S1 grade 3   Varicose veins 08/05/2012   Extending up to right groin B/l LE   Ventral hernia 08/05/2012   Wears glasses     She has a past surgical history that  includes Varicose vein surgery (1995); Dilation and curettage of uterus; Colonoscopy; Breast ductal system excision (Right, 05/05/2014); Total knee arthroplasty (Right, 07/01/2017); Mouth surgery; and Excisional total knee arthroplasty with antibiotic spacers (Right, 09/14/2020).   Her family history includes Asthma in her son and son; Cancer in her father and mother; Dementia in her maternal grandmother; Heart attack in her maternal grandfather and paternal uncle; Liver disease in her mother; Peripheral Artery Disease in her sister; Ulcerative colitis in her son and son.She reports that she has quit smoking. Her smoking use included cigarettes. She has never used smokeless tobacco. She reports that she does not drink alcohol and does not use drugs.  Current Outpatient Medications on File Prior to Visit  Medication Sig Dispense Refill   acetaminophen (TYLENOL) 500 MG tablet Take 1,000 mg by mouth in the morning and at bedtime.     aspirin EC 81 MG tablet Take 81 mg by mouth daily. Swallow whole.     atorvastatin (LIPITOR) 10 MG tablet Take 0.5 tablets (5 mg total) by mouth daily. 45 tablet 3   Cholecalciferol (PA VITAMIN D-3) 50 MCG (2000 UT) CAPS Take 4,000 Units by mouth every evening.     Coenzyme Q10 300 MG CAPS Take 300 mg by mouth every evening.     levothyroxine (SYNTHROID) 100 MCG tablet Take 1 tablet by mouth daily before breakfast 90 tablet 0   rifampin (RIFADIN) 300 MG capsule Take 1 capsule (300 mg total) by mouth 2 (two) times daily. 84 capsule 0   vitamin B-12 (CYANOCOBALAMIN) 1000 MCG tablet Take 1,000 mcg by mouth every evening.     No current facility-administered medications on file prior to visit.     Objective:  Objective  Physical Exam Constitutional:      General: She is not in acute distress.    Appearance: Normal appearance. She is not ill-appearing or toxic-appearing.  HENT:     Head: Normocephalic and atraumatic.     Right Ear: Tympanic membrane, ear canal and external  ear normal.     Left Ear: Tympanic membrane, ear canal and external ear normal.     Nose: No congestion or rhinorrhea.  Eyes:     Extraocular Movements: Extraocular movements intact.     Pupils: Pupils are equal, round, and reactive to light.  Cardiovascular:     Rate and Rhythm: Normal rate and regular rhythm.     Pulses: Normal pulses.     Heart sounds: Normal heart sounds. No murmur heard. Pulmonary:     Effort: Pulmonary effort is normal. No respiratory distress.     Breath sounds: Normal breath sounds. No wheezing, rhonchi or rales.  Abdominal:     General: Bowel sounds are normal.     Palpations: Abdomen is soft. There is no  mass.     Tenderness: no abdominal tenderness There is no guarding.     Hernia: No hernia is present.  Musculoskeletal:        General: Normal range of motion.     Cervical back: Normal range of motion and neck supple.  Skin:    General: Skin is warm and dry.  Neurological:     Mental Status: She is alert and oriented to person, place, and time.  Psychiatric:        Behavior: Behavior normal.   There were no vitals taken for this visit. Wt Readings from Last 3 Encounters:  09/14/20 128 lb 3.2 oz (58.2 kg)  07/28/20 133 lb (60.3 kg)  06/07/20 133 lb (60.3 kg)     Lab Results  Component Value Date   WBC 6.5 09/16/2020   HGB 11.0 (L) 09/16/2020   HCT 33.7 (L) 09/16/2020   PLT 313 09/16/2020   GLUCOSE 122 (H) 09/15/2020   CHOL 182 03/16/2020   TRIG 58.0 03/16/2020   HDL 63.00 03/16/2020   LDLCALC 108 (H) 03/16/2020   ALT 15 03/16/2020   AST 21 03/16/2020   NA 133 (L) 09/15/2020   K 4.3 09/15/2020   CL 100 09/15/2020   CREATININE 0.57 09/15/2020   BUN 6 (L) 09/15/2020   CO2 26 09/15/2020   TSH 1.74 03/16/2020   HGBA1C 5.6 03/16/2020    No results found.   Assessment & Plan:  Plan    No orders of the defined types were placed in this encounter.   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 She has had surgery and now she has a PICC line and is on Vancomycin and Rifampin she is following up with surgery and getting routine labwork. Encouraged daily probiotic       S/P TKR (total knee replacement)    S/p infection had to have surgical debridement on 09/14/20. Is following with        Hyperglycemia    hgba1c acceptable, minimize simple carbs. Increase exercise as tolerated.         Follow-up: Return in about 6 months (around 03/28/2021).   I,David Hanna,acting as a scribe for Penni Homans, MD.,have documented all relevant documentation on the behalf of Penni Homans, MD,as directed by  Penni Homans, MD while in the presence of Penni Homans, MD.  I, Mosie Lukes, MD personally performed the services described in this documentation. All medical record entries made by the scribe were at my direction and in my presence. I have reviewed the chart and agree that the record reflects my personal performance and is accurate and complete

## 2020-09-26 NOTE — Assessment & Plan Note (Signed)
S/p infection had to have surgical debridement on 09/14/20. Is following with

## 2020-09-26 NOTE — Patient Instructions (Addendum)
[place] multistrain probiotic 1 cap a day  Add protein with collagen powder, protein powder, whey powder, yellow split pea powder   PICC Home Care Guide A peripherally inserted central catheter (PICC) is a form of IV access that allows medicines and IV fluids to be quickly distributed throughout the body. The PICC is a long, thin, flexible tube (catheter) that is inserted into a vein in the upper arm. The catheter ends in a large vein in the chest (superior vena cava, or SVC). After the PICC is inserted, a chest X-ray may be done to make surethat it is in the correct place. A PICC may be placed for different reasons, such as: To give medicines and liquid nutrition. To give IV fluids and blood products. If there is trouble placing a peripheral intravenous (PIV) catheter. If taken care of properly, a PICC can remain in place for several months. Having a PICC can also allow a person to go home from the hospital sooner. Medicine and PICC care can be managed at home by a family member, caregiver, orhome health care team. What are the risks? Generally, having a PICC is safe. However, problems may occur, including: A blood clot (thrombus) forming in or at the tip of the PICC. A blood clot forming in a vein (deep vein thrombosis) or traveling to the lung (pulmonary embolism). Inflammation of the vein (phlebitis) in which the PICC is placed. Infection. Central line associated blood stream infection (CLABSI) is a serious infection that often requires hospitalization. PICC movement (malposition). The PICC tip may move from its original position due to excessive physical activity, forceful coughing, sneezing, or vomiting. A break or cut in the PICC. It is important not to use scissors near the PICC. Nerve or tendon irritation or injury during PICC insertion. How to take care of your PICC Preventing problems You and any caregivers should wash your hands often with soap. Wash hands: Before  touching the PICC line or the infusion device. Before changing a bandage (dressing). Flush the PICC as told by your health care provider. Let your health care provider know right away if the PICC is hard to flush or does not flush. Do not use force to flush the PICC. Do not use a syringe that is less than 10 mL to flush the PICC. Avoid blood pressure checks on the arm in which the PICC is placed. Never pull or tug on the PICC. Do not take the PICC out yourself. Only a trained clinical professional should remove the PICC. Use clean and sterile supplies only. Keep the supplies in a dry place. Do not reuse needles, syringes, or any other supplies. Doing that can lead to infection. Keep pets and children away from your PICC line. Check the PICC insertion site every day for signs of infection. Check for: Leakage. Redness, swelling, or pain. Fluid or blood. Warmth. Pus or a bad smell. PICC dressing care Keep your PICC bandage (dressing) clean and dry to prevent infection. Do not take baths, swim, or use a hot tub until your health care provider approves. Ask your health care provider if you can take showers. You may only be allowed to take sponge baths for bathing. When you are allowed to shower: Ask your health care provider to teach you how to wrap the PICC line. Cover the PICC line with clear plastic wrap and tape to keep it dry while showering. Follow instructions from your health care provider about how to take care of your insertion site  and dressing. Make sure you: Wash your hands with soap and water before you change your bandage (dressing). If soap and water are not available, use hand sanitizer. Change your dressing as told by your health care provider. Leave stitches (sutures), skin glue, or adhesive strips in place. These skin closures may need to stay in place for 2 weeks or longer. If adhesive strip edges start to loosen and curl up, you may trim the loose edges. Do not remove adhesive  strips completely unless your health care provider tells you to do that. Change your PICC dressing if it becomes loose or wet. General instructions  Carry your PICC identification card or wear a medical alert bracelet at all times. Keep the tube clamped at all times, unless it is being used. Carry a smooth-edge clamp with you at all times to place on the tube if it breaks. Do not use scissors or sharp objects near the tube. You may bend your arm and move it freely. If your PICC is near or at the bend of your elbow, avoid activity with repeated motion at the elbow. Avoid lifting heavy objects as told by your health care provider. Keep all follow-up visits as told by your health care provider. This is important.  Disposal of supplies Throw away any syringes in a disposal container that is meant for sharp items (sharps container). You can buy a sharps container from a pharmacy, or you can make one by using an empty hard plastic bottle with a cover. Place any used dressings or infusion bags into a plastic bag. Throw that bag in the trash. Contact a health care provider if: You have pain in your arm, ear, face, or teeth. You have a fever or chills. You have redness, swelling, or pain around the insertion site. You have fluid or blood coming from the insertion site. Your insertion site feels warm to the touch. You have pus or a bad smell coming from the insertion site. Your skin feels hard and raised around the insertion site. Get help right away if: Your PICC is accidentally pulled all the way out. If this happens, cover the insertion site with a bandage or gauze dressing. Do not throw the PICC away. Your health care provider will need to check it. Your PICC was tugged or pulled and has partially come out. Do not  push the PICC back in. You cannot flush the PICC, it is hard to flush, or the PICC leaks around the insertion site when it is flushed. You hear a "flushing" sound when the PICC is  flushed. You feel your heart racing or skipping beats. There is a hole or tear in the PICC. You have swelling in the arm in which the PICC was inserted. You have a red streak going up your arm from where the PICC was inserted. Summary A peripherally inserted central catheter (PICC) is a long, thin, flexible tube (catheter) that is inserted into a vein in the upper arm. The PICC is inserted using a sterile technique by a specially trained nurse or physician. Only a trained clinical professional should remove it. Keep your PICC identification card with you at all times. Avoid blood pressure checks on the arm in which the PICC is placed. If cared for properly, a PICC can remain in place for several months. Having a PICC can also allow a person to go home from the hospital sooner. This information is not intended to replace advice given to you by your health care provider.  Make sure you discuss any questions you have with your healthcare provider. Document Revised: 08/04/2019 Document Reviewed: 08/04/2019 Elsevier Patient Education  2022 [employer].

## 2020-09-26 NOTE — Assessment & Plan Note (Signed)
hgba1c acceptable, minimize simple carbs. Increase exercise as tolerated.  

## 2020-09-27 ENCOUNTER — Other Ambulatory Visit: Payer: Self-pay | Admitting: Family Medicine

## 2020-09-27 ENCOUNTER — Telehealth: Payer: Self-pay

## 2020-09-27 DIAGNOSIS — E119 Type 2 diabetes mellitus without complications: Secondary | ICD-10-CM | POA: Diagnosis not present

## 2020-09-27 DIAGNOSIS — T8453XA Infection and inflammatory reaction due to internal right knee prosthesis, initial encounter: Secondary | ICD-10-CM | POA: Diagnosis not present

## 2020-09-27 DIAGNOSIS — E782 Mixed hyperlipidemia: Secondary | ICD-10-CM | POA: Diagnosis not present

## 2020-09-27 DIAGNOSIS — I480 Paroxysmal atrial fibrillation: Secondary | ICD-10-CM | POA: Diagnosis not present

## 2020-09-27 DIAGNOSIS — E039 Hypothyroidism, unspecified: Secondary | ICD-10-CM | POA: Diagnosis not present

## 2020-09-27 DIAGNOSIS — F418 Other specified anxiety disorders: Secondary | ICD-10-CM | POA: Diagnosis not present

## 2020-09-27 MED ORDER — NYSTATIN 100000 UNIT/ML MT SUSP: 5.0000 mL | Freq: Four times a day (QID) | OROMUCOSAL | 0 refills | Status: DC

## 2020-09-27 NOTE — Assessment & Plan Note (Signed)
On Levothyroxine, continue to monitor 

## 2020-09-27 NOTE — Telephone Encounter (Signed)
Spoke with patient, she states that [facility] drew labs this week and she is having them fax results to Dr. Doyne Keel attention.

## 2020-09-27 NOTE — Assessment & Plan Note (Addendum)
3 years after right tkr she developed a septic joint. She has had surgery and now she has a PICC line and is on Vancomycin and Rifampin she is following up with surgery and getting routine labwork. Encouraged daily probiotic

## 2020-09-27 NOTE — Telephone Encounter (Signed)
-----   Message from Yevette Edwards, RN sent at 09/20/2020  1:42 PM EDT ----- Regarding: Labs Repeat CBC, order in epic.

## 2020-09-29 ENCOUNTER — Telehealth: Payer: Self-pay

## 2020-09-29 DIAGNOSIS — E119 Type 2 diabetes mellitus without complications: Secondary | ICD-10-CM | POA: Diagnosis not present

## 2020-09-29 DIAGNOSIS — E782 Mixed hyperlipidemia: Secondary | ICD-10-CM | POA: Diagnosis not present

## 2020-09-29 DIAGNOSIS — T8453XA Infection and inflammatory reaction due to internal right knee prosthesis, initial encounter: Secondary | ICD-10-CM | POA: Diagnosis not present

## 2020-09-29 DIAGNOSIS — E039 Hypothyroidism, unspecified: Secondary | ICD-10-CM | POA: Diagnosis not present

## 2020-09-29 DIAGNOSIS — F418 Other specified anxiety disorders: Secondary | ICD-10-CM | POA: Diagnosis not present

## 2020-09-29 DIAGNOSIS — I480 Paroxysmal atrial fibrillation: Secondary | ICD-10-CM | POA: Diagnosis not present

## 2020-09-29 NOTE — Telephone Encounter (Signed)
Patient calling to get clarity on how to take the rifampin. Patient was unsure has to how long to wait to eat after taking the medication. Patient has been advised to contact the pharmacy on the directions. Patient also complained of having diarrhea since being on the antibiotics. She would like to know is this is something she is going to have to deal with while on the antibiotics. Please advise

## 2020-10-02 ENCOUNTER — Telehealth: Payer: Self-pay | Admitting: Pharmacist

## 2020-10-02 ENCOUNTER — Telehealth: Payer: Self-pay

## 2020-10-02 DIAGNOSIS — A419 Sepsis, unspecified organism: Secondary | ICD-10-CM | POA: Diagnosis not present

## 2020-10-02 DIAGNOSIS — F418 Other specified anxiety disorders: Secondary | ICD-10-CM | POA: Diagnosis not present

## 2020-10-02 DIAGNOSIS — E782 Mixed hyperlipidemia: Secondary | ICD-10-CM | POA: Diagnosis not present

## 2020-10-02 DIAGNOSIS — I480 Paroxysmal atrial fibrillation: Secondary | ICD-10-CM | POA: Diagnosis not present

## 2020-10-02 DIAGNOSIS — E039 Hypothyroidism, unspecified: Secondary | ICD-10-CM | POA: Diagnosis not present

## 2020-10-02 DIAGNOSIS — E119 Type 2 diabetes mellitus without complications: Secondary | ICD-10-CM | POA: Diagnosis not present

## 2020-10-02 DIAGNOSIS — T8453XA Infection and inflammatory reaction due to internal right knee prosthesis, initial encounter: Secondary | ICD-10-CM | POA: Diagnosis not present

## 2020-10-02 LAB — CBC AND DIFFERENTIAL
HCT: 36 (ref 36–46)
Hemoglobin: 11.8 — AB (ref 12.0–16.0)
Neutrophils Absolute: 2.1
Platelets: 343 (ref 150–399)
WBC: 4.2

## 2020-10-02 LAB — BASIC METABOLIC PANEL WITH GFR
BUN: 11 (ref 4–21)
Creatinine: 0.6 (ref 0.5–1.1)
Glucose: 86
Sodium: 138 (ref 137–147)

## 2020-10-02 LAB — CBC: RBC: 4.09 (ref 3.87–5.11)

## 2020-10-02 NOTE — Telephone Encounter (Signed)
Patient called with question about how to take rifampin. She read that it needs to be taken without food. I told her that was correct and that food decreases the absorption. She must take it 1 hour prior to eating or wait 2 hours after eating to take it. She verbalized understanding. All questions were answered.

## 2020-10-02 NOTE — Telephone Encounter (Signed)
Patient called said she has a picc but has two questions, first who gave or gives the order for home health nurse. And second wants to know if she can do physical therapy at a out patient facility because Dr. Aurea Graff had suggested it. Patient just wants to know because she is unsure for medicare. Best contact number [phone]

## 2020-10-03 DIAGNOSIS — I480 Paroxysmal atrial fibrillation: Secondary | ICD-10-CM | POA: Diagnosis not present

## 2020-10-03 DIAGNOSIS — T8453XA Infection and inflammatory reaction due to internal right knee prosthesis, initial encounter: Secondary | ICD-10-CM | POA: Diagnosis not present

## 2020-10-03 DIAGNOSIS — F418 Other specified anxiety disorders: Secondary | ICD-10-CM | POA: Diagnosis not present

## 2020-10-03 DIAGNOSIS — E039 Hypothyroidism, unspecified: Secondary | ICD-10-CM | POA: Diagnosis not present

## 2020-10-03 DIAGNOSIS — E119 Type 2 diabetes mellitus without complications: Secondary | ICD-10-CM | POA: Diagnosis not present

## 2020-10-03 DIAGNOSIS — E782 Mixed hyperlipidemia: Secondary | ICD-10-CM | POA: Diagnosis not present

## 2020-10-04 ENCOUNTER — Encounter: Payer: Self-pay | Admitting: Family Medicine

## 2020-10-04 NOTE — Telephone Encounter (Signed)
Faxed labs to patient's PCP Penni Homans, MD per patient's request.   Beryle Flock, RN

## 2020-10-04 NOTE — Telephone Encounter (Signed)
Relayed verbal orders to [facility] [place] per Magda Kiel, RPH to change BMP to CMET as patient is on rifampin. Orders repeated and verified.   Beryle Flock, RN

## 2020-10-04 NOTE — Telephone Encounter (Signed)
Hmm. Urine odor is not usually a side effect of rifampin or cefazolin. I can't find any reports on it either. Rifampin can make your urine/feces/saliva/sweat/tears a reddish/orange color, but not sure about the smell. I would advise to keep an eye on it and drink lots of water to stay hydrated.

## 2020-10-05 ENCOUNTER — Other Ambulatory Visit: Payer: Self-pay

## 2020-10-05 ENCOUNTER — Encounter (HOSPITAL_COMMUNITY): Payer: Self-pay

## 2020-10-05 ENCOUNTER — Encounter: Payer: Self-pay | Admitting: *Deleted

## 2020-10-05 ENCOUNTER — Emergency Department (HOSPITAL_COMMUNITY)
Admission: EM | Admit: 2020-10-05 | Discharge: 2020-10-06 | Disposition: A | Discharge status: Home / Self Care | Payer: Medicare Other | Attending: Emergency Medicine | Admitting: Emergency Medicine

## 2020-10-05 ENCOUNTER — Emergency Department (HOSPITAL_COMMUNITY): Payer: Medicare Other

## 2020-10-05 DIAGNOSIS — E039 Hypothyroidism, unspecified: Secondary | ICD-10-CM

## 2020-10-05 DIAGNOSIS — R002 Palpitations: Secondary | ICD-10-CM | POA: Insufficient documentation

## 2020-10-05 DIAGNOSIS — D72819 Decreased white blood cell count, unspecified: Secondary | ICD-10-CM | POA: Diagnosis not present

## 2020-10-05 DIAGNOSIS — Z96651 Presence of right artificial knee joint: Secondary | ICD-10-CM | POA: Insufficient documentation

## 2020-10-05 DIAGNOSIS — Z87891 Personal history of nicotine dependence: Secondary | ICD-10-CM | POA: Insufficient documentation

## 2020-10-05 DIAGNOSIS — Z7982 Long term (current) use of aspirin: Secondary | ICD-10-CM | POA: Insufficient documentation

## 2020-10-05 DIAGNOSIS — R0789 Other chest pain: Secondary | ICD-10-CM | POA: Diagnosis not present

## 2020-10-05 DIAGNOSIS — F418 Other specified anxiety disorders: Secondary | ICD-10-CM | POA: Diagnosis not present

## 2020-10-05 DIAGNOSIS — T8453XA Infection and inflammatory reaction due to internal right knee prosthesis, initial encounter: Secondary | ICD-10-CM | POA: Diagnosis not present

## 2020-10-05 DIAGNOSIS — I480 Paroxysmal atrial fibrillation: Secondary | ICD-10-CM | POA: Diagnosis not present

## 2020-10-05 DIAGNOSIS — E782 Mixed hyperlipidemia: Secondary | ICD-10-CM | POA: Diagnosis not present

## 2020-10-05 DIAGNOSIS — Z79899 Other long term (current) drug therapy: Secondary | ICD-10-CM | POA: Insufficient documentation

## 2020-10-05 DIAGNOSIS — E119 Type 2 diabetes mellitus without complications: Secondary | ICD-10-CM | POA: Diagnosis not present

## 2020-10-05 LAB — C-REACTIVE PROTEIN: CRP: 13

## 2020-10-05 NOTE — ED Triage Notes (Signed)
Pt to Ed with c/o fluttering in her chest which began today. Pt denies any other symptoms. Pt currently has a PICC line due to recent infection and surgery on her R knee. Pt states she feels that her heart is skipping a beat. Arrives A+O, VSS.

## 2020-10-05 NOTE — ED Provider Notes (Addendum)
Emergency Medicine Provider Triage Evaluation Note  Crystal Russell , a 70 y.o. female  was evaluated in triage.  Pt complains of palpitations. States her heart is fluttering. Started tonight. No associated weakness, CP, SOB. Feels anxious. Had infection of prosthetic right knee, initially replaced in march 2019. Has surgical wash out June 9. PICC line in right arm. On rifampin and vancomycin. Compliant synthroid. Takes baby asa daily.   Review of Systems  Positive: palpitations Negative: Cp, sob  Physical Exam  BP 134/78   Pulse 66   Temp 98.1 F (36.7 C) (Oral)   Ht 5' 3.5" (1.613 m)   Wt 58.5 kg   SpO2 100%   BMI 22.49 kg/m  Gen:   Awake, Resp:  Normal effort  MSK:   Moves extremities without difficulty, right anterior knee with surgical scar, no erythema, no drainage.    Medical Decision Making  Medically screening exam initiated at 11:03 PM.  Appropriate orders placed.  Arisbeth Purrington was informed that the remainder of the evaluation will be completed by another provider, this initial triage assessment does not replace that evaluation, and the importance of remaining in the ED until their evaluation is complete.     Larna Capelle, [place] N, PA-C 10/05/20 2308    Arnav Cregg, [place] N, PA-C 10/05/20 2309    Valarie Merino, MD 10/05/20 [phone]

## 2020-10-06 ENCOUNTER — Emergency Department (HOSPITAL_COMMUNITY): Payer: Medicare Other

## 2020-10-06 ENCOUNTER — Telehealth: Payer: Self-pay

## 2020-10-06 ENCOUNTER — Telehealth: Payer: Self-pay | Admitting: Infectious Diseases

## 2020-10-06 ENCOUNTER — Other Ambulatory Visit: Payer: Self-pay

## 2020-10-06 ENCOUNTER — Encounter (HOSPITAL_COMMUNITY): Payer: Self-pay

## 2020-10-06 DIAGNOSIS — E039 Hypothyroidism, unspecified: Secondary | ICD-10-CM | POA: Diagnosis not present

## 2020-10-06 DIAGNOSIS — R002 Palpitations: Secondary | ICD-10-CM | POA: Diagnosis not present

## 2020-10-06 DIAGNOSIS — D72819 Decreased white blood cell count, unspecified: Secondary | ICD-10-CM | POA: Diagnosis not present

## 2020-10-06 LAB — CBC WITH DIFFERENTIAL/PLATELET
Abs Immature Granulocytes: 0.05 10*3/uL (ref 0.00–0.07)
Basophils Absolute: 0 10*3/uL (ref 0.0–0.1)
Basophils Relative: 1 %
Eosinophils Absolute: 0.1 10*3/uL (ref 0.0–0.5)
Eosinophils Relative: 4 %
HCT: 34.6 % — ABNORMAL LOW (ref 36.0–46.0)
Hemoglobin: 11.4 g/dL — ABNORMAL LOW (ref 12.0–15.0)
Immature Granulocytes: 1 %
Lymphocytes Relative: 23 %
Lymphs Abs: 0.9 10*3/uL (ref 0.7–4.0)
MCH: 28.6 pg (ref 26.0–34.0)
MCHC: 32.9 g/dL (ref 30.0–36.0)
MCV: 86.9 fL (ref 80.0–100.0)
Monocytes Absolute: 0.4 10*3/uL (ref 0.1–1.0)
Monocytes Relative: 11 %
Neutro Abs: 2.3 10*3/uL (ref 1.7–7.7)
Neutrophils Relative %: 60 %
Platelets: 259 10*3/uL (ref 150–400)
RBC: 3.98 MIL/uL (ref 3.87–5.11)
RDW: 12.4 % (ref 11.5–15.5)
WBC: 3.9 10*3/uL — ABNORMAL LOW (ref 4.0–10.5)
nRBC: 0 % (ref 0.0–0.2)

## 2020-10-06 LAB — BASIC METABOLIC PANEL
Anion gap: 8 (ref 5–15)
BUN: 15 mg/dL (ref 8–23)
CO2: 27 mmol/L (ref 22–32)
Calcium: 9.2 mg/dL (ref 8.9–10.3)
Chloride: 99 mmol/L (ref 98–111)
Creatinine, Ser: 0.53 mg/dL (ref 0.44–1.00)
GFR, Estimated: >60 mL/min (ref >60)
Glucose, Bld: 115 mg/dL — ABNORMAL HIGH (ref 70–99)
Potassium: 3.8 mmol/L (ref 3.5–5.1)
Sodium: 134 mmol/L — ABNORMAL LOW (ref 135–145)

## 2020-10-06 LAB — TROPONIN I (HIGH SENSITIVITY)
Troponin I (High Sensitivity): 4 ng/L (ref <18)
Troponin I (High Sensitivity): 4 ng/L (ref <18)

## 2020-10-06 LAB — TSH: TSH: 7.869 u[IU]/mL — ABNORMAL HIGH (ref 0.350–4.500)

## 2020-10-06 MED ORDER — IOHEXOL 350 MG/ML SOLN
100.0000 mL | Freq: Once | INTRAVENOUS | Status: AC | PRN
  Administered 2020-10-06: 80 mL via INTRAVENOUS

## 2020-10-06 MED ORDER — LEVOTHYROXINE SODIUM 112 MCG PO TABS: 112.0000 ug | ORAL_TABLET | Freq: Every day | ORAL | 1 refills | Status: DC

## 2020-10-06 MED ORDER — SODIUM CHLORIDE (PF) 0.9 % IJ SOLN
INTRAMUSCULAR | Status: AC
  Filled 2020-10-06: qty 50

## 2020-10-06 NOTE — ED Notes (Signed)
Patient denies pain and is resting comfortably.  

## 2020-10-06 NOTE — Discharge Instructions (Addendum)
Your TSH today is 7.869  Your white cell count today is 3.3 it was 4.2 on 6/27

## 2020-10-06 NOTE — ED Notes (Signed)
Pt verbalized understanding of d/c and follow up care. Ambulatory with steady gait.  

## 2020-10-06 NOTE — Telephone Encounter (Signed)
Received call from patient stating she was in the ED last night due to palpitations.   Patient is now concerned about leaving her IV infusion medication out since last night. Per [facility] ([facility]) cefazolin is still good for several days.  Patient also taking rifampin along with cefazolin and she  is also concerned about her thyroid levels being elevated due to medications.  Spoke with Cassie Pharm-D who will look more into to this.   Routing message to primary care as patient may need to follow up with PCP.  Patient expecting a call return  [place]

## 2020-10-06 NOTE — Telephone Encounter (Signed)
Patient called our office stating she went to the ER yesterday , states her infectious dr put her on Rifampin and another medication ... states her WBC was 3.3 and her TSH was 7.8 , and she read that Rifampin can effect her TSH and has concerns about the medication and wants a call back.

## 2020-10-06 NOTE — Telephone Encounter (Signed)
I was called last PM by [name] for palpitations. She states she has had afib prior and this did not feel like this.  She was seen by Dr Baxter Flattery prior for a prosthetic joint infection and is on home IV anbx (ancef/rifampin). I advised her to go to the ED for further evaluation.

## 2020-10-06 NOTE — Telephone Encounter (Signed)
Pt called and medication sent in. Lab appointment 11/03/20

## 2020-10-06 NOTE — ED Provider Notes (Signed)
[facility] DEPT Provider Note   CSN: [id number] Arrival date & time: 10/05/20  2239     History Chief Complaint  Patient presents with   Palpitations    Crystal Russell is a 70 y.o. female.  The history is provided by the patient.  Illness Location:  Chest Quality:  Palpitations Severity:  Moderate Onset quality:  Gradual Timing:  Intermittent Progression:  Unchanged Chronicity:  Recurrent Context:  Patient with a h/o Afib and hypothyroidism, currently being treated for a prosthetic knee infection with vancomycine and Rifampin Relieved by:  Nothing Worsened by:  Nothing Ineffective treatments:  None Associated symptoms: no chest pain, no fever, no loss of consciousness, no shortness of breath, no vomiting and no wheezing   Patient is a pleasant 70 year old female with past medical history significant for Atrial fibrillation and hypothyroidism currently on Synthroid who presents with palpitations.  She has seen cardiology in the past for this problem and has worn a heart monitor.  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      Past Medical History:  Diagnosis Date   Abdominal wall hernia 08/04/2012   Atrial fibrillation ([place]) 2008   REPORTS SHE WAS AT [place], UPON WAKING I FELT MY HEART GOING BOOM BOOM BOOM BEATING OUT OF MY CHEST. I WENT TO HOSPITAL AND THEY ISED MEDICINE TO CONVERT ME , IT TOOK OVER 10 HOURS TO CONVERT ME. BUT AFTER THAT IVE NEVER HAD ANY ISSUES SINCE. I USED TO HAVE A CARDIOLOGUIST AS MY PRIMARY IN [place] BUT IHAVENT BEEN TO A CARDIOLOGIST SINCE I MOVED DOWN HERE.    Chicken pox as a child   Depression    Depression with anxiety 12/27/2012   Diverticulitis    DJD (degenerative joint disease) 08/05/2012   DVT (deep venous thrombosis) ([place]) 2018   Fifth disease 1989   DX AFTER EXPOSURE FROM HER CHILDREN; DEVELOPED RHEUMATOID  ARTHRITIS DURING COURSE OF ILLNESS. REPORTS:  " I DONT HAVE IT ANYMORE SINCE THE DISEASE IS GONE"    H/O gestational diabetes mellitus, not currently pregnant 08/05/2012   History of    Heart murmur    echo 2012; "I HAVE A FUNCTIONAL MURMUR"    Hyperlipidemia    Hypothyroidism    Knee effusion, right    while on Eliquis, discontinued Eliquis after DVT resolved   Measles as a child   Migraines    Other and unspecified hyperlipidemia 08/05/2012   Paroxysmal A-fib ([place])    Shingles 43   Spondylisthesis 2012   L5/S1 grade 3   Varicose veins 08/05/2012   Extending up to right groin B/l LE   Ventral hernia 08/05/2012   Wears glasses     Patient Active Problem List   Diagnosis Date Noted   Infection of prosthetic right knee joint ([place]) 09/14/2020   PAC (premature atrial contraction) 07/28/2020   PAF (paroxysmal atrial fibrillation) ([place]) 05/01/2020   Mild aortic valve regurgitation 05/01/2020   Mild mitral regurgitation 05/01/2020   PFO (patent foramen ovale) 05/01/2020   Laceration of skin of right hand 03/19/2020   Diverticulitis 03/16/2020   Hyperglycemia 03/16/2020   Vertigo 09/14/2019   Status post total right knee replacement 07/02/2017   S/P TKR (total knee replacement) 07/01/2017   Arthritis, septic, knee ([place]) 06/16/2017   Postmenopausal bleeding 12/06/2015   Varicose veins of bilateral lower extremities with other complications 79/89/2119   Sun-damaged skin  12/04/2014   Breast mass, left 05/16/2014   Varicose veins of lower extremities with other complications 62/95/2841   Preventative health care 08/08/2013   Hypothyroidism 08/08/2013   Atrophic vaginitis 08/08/2013   Cervical cancer screening 08/05/2013   ETD (eustachian tube dysfunction) 05/26/2013   Depression with anxiety 12/27/2012   Ventral hernia 08/05/2012   Varicose veins 08/05/2012   DJD (degenerative joint disease) 08/05/2012   Constipation 08/05/2012   Hyperlipidemia, mixed 08/05/2012   H/O gestational  diabetes mellitus, not currently pregnant 08/05/2012   Chicken pox    Shingles    Paroxysmal A-fib ([place])    Heart murmur    Spondylisthesis     Past Surgical History:  Procedure Laterality Date   BREAST DUCTAL SYSTEM EXCISION Right 05/05/2014   Procedure: RIGHT BREAST DUCT EXCISION;  Surgeon: Rolm Bookbinder, MD;  Location: [facility];  Service: General;  Laterality: Right;   COLONOSCOPY     DILATION AND CURETTAGE OF UTERUS     x4   EXCISIONAL TOTAL KNEE ARTHROPLASTY WITH ANTIBIOTIC SPACERS Right 09/14/2020   Procedure: Irrigation and debriement with poly exchange versus resection of right total knee arthroplasty and placement of antibitiotic spacer;  Surgeon: Paralee Cancel, MD;  Location: [facility];  Service: Orthopedics;  Laterality: Right;   MOUTH SURGERY     TOTAL KNEE ARTHROPLASTY Right 07/01/2017   Procedure: RIGHT TOTAL KNEE ARTHROPLASTY;  Surgeon: Paralee Cancel, MD;  Location: [facility];  Service: Orthopedics;  Laterality: Right;  46 mins   [place]   right leg     OB History     Gravida  3   Para  3   Term      Preterm      AB      Living  3      SAB      IAB      Ectopic      Multiple      Live Births              Family History  Problem Relation Age of Onset   Cancer Mother        lung- smoker   Liver disease Mother    Cancer Father        lung- smoker   Peripheral Artery Disease Sister    Dementia Maternal Grandmother    Heart attack Maternal Grandfather    Asthma Son    Ulcerative colitis Son    Asthma Son    Ulcerative colitis Son    Heart attack Paternal Uncle     Social History   Tobacco Use   Smoking status: Former    Years: 4.00    Pack years: 0.00    Types: Cigarettes   Smokeless tobacco: Never  Vaping Use   Vaping Use: Never used  Substance Use Topics   Alcohol use: No   Drug use: No    Home Medications Prior to Admission medications   Medication Sig Start Date End Date Taking?  Authorizing Provider  acetaminophen (TYLENOL) 500 MG tablet Take 1,000 mg by mouth in the morning and at bedtime.    [provider]  aspirin EC 81 MG tablet Take 81 mg by mouth daily. Swallow whole.    [provider]  atorvastatin (LIPITOR) 10 MG tablet Take 0.5 tablets (5 mg total) by mouth daily. 09/12/20   Werner Lean, MD  Cholecalciferol (PA VITAMIN D-3) 50 MCG (2000 UT) CAPS Take 4,000 Units by  mouth every evening.    [provider]  Coenzyme Q10 300 MG CAPS Take 300 mg by mouth every evening.    [provider]  levothyroxine (SYNTHROID) 100 MCG tablet Take 1 tablet by mouth daily before breakfast 09/25/20   Mosie Lukes, MD  nystatin (MYCOSTATIN) 100000 UNIT/ML suspension Take 5 mLs (500,000 Units total) by mouth 4 (four) times daily. 09/27/20   Mosie Lukes, MD  rifampin (RIFADIN) 300 MG capsule Take 1 capsule (300 mg total) by mouth 2 (two) times daily. 09/15/20 10/27/20  Paralee Cancel, MD  vitamin B-12 (CYANOCOBALAMIN) 1000 MCG tablet Take 1,000 mcg by mouth every evening.    [provider]    Allergies    Crestor [rosuvastatin] and Pravachol [pravastatin]  Review of Systems   Review of Systems  Constitutional:  Negative for fever.  HENT:  Negative for facial swelling.   Eyes:  Negative for redness.  Respiratory:  Negative for shortness of breath and wheezing.   Cardiovascular:  Positive for palpitations. Negative for chest pain.  Gastrointestinal:  Negative for vomiting.  Skin:        Surgical incision of the right knee  Neurological:  Negative for loss of consciousness and facial asymmetry.  Psychiatric/Behavioral:  Negative for agitation.   All other systems reviewed and are negative.  Physical Exam Updated Vital Signs BP 106/74   Pulse (!) 55   Temp 98.1 F (36.7 C) (Oral)   Resp 17   Ht 5' 3.5" (1.613 m)   Wt 58.5 kg   SpO2 99%   BMI 22.49 kg/m   Physical Exam Vitals and nursing note reviewed.   Constitutional:      General: She is not in acute distress.    Appearance: Normal appearance. She is not diaphoretic.  HENT:     Head: Normocephalic and atraumatic.     Right Ear: External ear normal.     Left Ear: External ear normal.  Eyes:     Conjunctiva/sclera: Conjunctivae normal.     Pupils: Pupils are equal, round, and reactive to light.  Cardiovascular:     Rate and Rhythm: Normal rate and regular rhythm.     Pulses: Normal pulses.     Heart sounds: Normal heart sounds.  Pulmonary:     Effort: Pulmonary effort is normal.     Breath sounds: Normal breath sounds.  Abdominal:     General: Abdomen is flat. Bowel sounds are normal.     Palpations: Abdomen is soft.     Tenderness: There is no abdominal tenderness. There is no guarding.  Musculoskeletal:     Cervical back: Normal range of motion and neck supple.     Right lower leg: No edema.     Left lower leg: No edema.  Skin:    General: Skin is warm and dry.     Capillary Refill: Capillary refill takes less than 2 seconds.     Comments: Incision of the right knee  clean dry and intact   Neurological:     General: No focal deficit present.     Mental Status: She is alert.     Deep Tendon Reflexes: Reflexes normal.  Psychiatric:        Mood and Affect: Mood normal.        Behavior: Behavior normal.    ED Results / Procedures / Treatments   Labs (all labs ordered are listed, but only abnormal results are displayed) Results for orders placed or performed during the  hospital encounter of 77/93/90  Basic metabolic panel  Result Value Ref Range   Sodium 134 (L) 135 - 145 mmol/L   Potassium 3.8 3.5 - 5.1 mmol/L   Chloride 99 98 - 111 mmol/L   CO2 27 22 - 32 mmol/L   Glucose, Bld 115 (H) 70 - 99 mg/dL   BUN 15 8 - 23 mg/dL   Creatinine, Ser 0.53 0.44 - 1.00 mg/dL   Calcium 9.2 8.9 - 10.3 mg/dL   GFR, Estimated >60 >60 mL/min   Anion gap 8 5 - 15  CBC with Differential  Result Value Ref Range   WBC 3.9 (L) 4.0 -  10.5 K/uL   RBC 3.98 3.87 - 5.11 MIL/uL   Hemoglobin 11.4 (L) 12.0 - 15.0 g/dL   HCT 34.6 (L) 36.0 - 46.0 %   MCV 86.9 80.0 - 100.0 fL   MCH 28.6 26.0 - 34.0 pg   MCHC 32.9 30.0 - 36.0 g/dL   RDW 12.4 11.5 - 15.5 %   Platelets 259 150 - 400 K/uL   nRBC 0.0 0.0 - 0.2 %   Neutrophils Relative % 60 %   Neutro Abs 2.3 1.7 - 7.7 K/uL   Lymphocytes Relative 23 %   Lymphs Abs 0.9 0.7 - 4.0 K/uL   Monocytes Relative 11 %   Monocytes Absolute 0.4 0.1 - 1.0 K/uL   Eosinophils Relative 4 %   Eosinophils Absolute 0.1 0.0 - 0.5 K/uL   Basophils Relative 1 %   Basophils Absolute 0.0 0.0 - 0.1 K/uL   Immature Granulocytes 1 %   Abs Immature Granulocytes 0.05 0.00 - 0.07 K/uL  TSH  Result Value Ref Range   TSH 7.869 (H) 0.350 - 4.500 uIU/mL  Troponin I (High Sensitivity)  Result Value Ref Range   Troponin I (High Sensitivity) 4 <18 ng/L   DG Chest 2 View  Result Date: 10/05/2020 CLINICAL DATA:  Heart palpitations EXAM: CHEST - 2 VIEW COMPARISON:  None. FINDINGS: The heart size and mediastinal contours are within normal limits. Both lungs are clear. The visualized skeletal structures are unremarkable. IMPRESSION: No active cardiopulmonary disease. Electronically Signed   By: Ulyses Jarred M.D.   On: 10/05/2020 23:26   CT Angio Chest PE W and/or Wo Contrast  Result Date: 10/06/2020 CLINICAL DATA:  Chest palpitations, history of DVT, paroxysmal atrial fibrillation EXAM: CT ANGIOGRAPHY CHEST WITH CONTRAST TECHNIQUE: Multidetector CT imaging of the chest was performed using the standard protocol during bolus administration of intravenous contrast. Multiplanar CT image reconstructions and MIPs were obtained to evaluate the vascular anatomy. CONTRAST:  83mL OMNIPAQUE IOHEXOL 350 MG/ML SOLN COMPARISON:  None. FINDINGS: Cardiovascular: Satisfactory opacification of the pulmonary arteries to the segmental level. No evidence of pulmonary embolism. Normal heart size. No pericardial effusion. Right upper  extremity PICC line tip is seen within the superior right atrium. Mediastinum/Nodes: No enlarged mediastinal, hilar, or axillary lymph nodes. Thyroid gland, trachea, and esophagus demonstrate no significant findings. Lungs/Pleura: Lungs are clear. No pleural effusion or pneumothorax. Upper Abdomen: No acute abnormality. Musculoskeletal: No acute bone abnormality. Osseous structures are age-appropriate. Moderate degenerative changes are seen within the thoracic spine. Review of the MIP images confirms the above findings. IMPRESSION: No pulmonary embolism.  No acute intrathoracic pathology identified. Electronically Signed   By: Fidela Salisbury MD   On: 10/06/2020 02:25   [place] EKG SITE RITE  Result Date: 09/14/2020 If Site Rite image not attached, placement could not be confirmed due to current cardiac rhythm.  EKG EKG Interpretation  Date/Time:  Friday October 06 2020 00:01:14 EDT Ventricular Rate:  60 PR Interval:  53 QRS Duration: 85 QT Interval:  427 QTC Calculation: 427 R Axis:   41 Text Interpretation: Sinus rhythm Ventricular premature complex Short PR interval Confirmed by Dory Horn) on 10/06/2020 12:49:45 AM  Radiology DG Chest 2 View  Result Date: 10/05/2020 CLINICAL DATA:  Heart palpitations EXAM: CHEST - 2 VIEW COMPARISON:  None. FINDINGS: The heart size and mediastinal contours are within normal limits. Both lungs are clear. The visualized skeletal structures are unremarkable. IMPRESSION: No active cardiopulmonary disease. Electronically Signed   By: Ulyses Jarred M.D.   On: 10/05/2020 23:26    Procedures Procedures   Medications Ordered in ED Medications  sodium chloride (PF) 0.9 % injection (has no administration in time range)  iohexol (OMNIPAQUE) 350 MG/ML injection 100 mL (80 mLs Intravenous Contrast Given 10/06/20 0202)    ED Course  I have reviewed the triage vital signs and the nursing notes.  Pertinent labs & imaging results that were available during my  care of the patient were reviewed by me and considered in my medical decision making (see chart for details).  No runs of PVCs seen on the monitor strips in the ED  I have informed the patient of her elevated TSH level.  I have also printed this on her discharge paperwork. I have advised close follow up with her family doctor for Synthroid dose adjustment.  Patient and husband verbalize understanding and agree to follow up.  Patient was ruled out for MI in the ED with 2 sets of cardiac enzymes and a negative EKG.  Therefore ischemia is not the cause of the patient's symptoms.  Patient was also ruled out for PE in the ED given the recent knee surgery on 09/14/20 and the palpitations.    The patient is undergoing active treatment for a knee prosthetic infection at this time.Based on exam, vitals, and labs there are no signs of sepsis or systemic infection at this time and I do not believe that infection is the cause of the palpitations.   I have excluded life threatening conditions, but due to the patient's complex history and nature of her ongoing medical treatment, I will refer the patient back to cardiology for additional work up of the palpitations.   I spoke with the patient and her husband at length regarding her care.    Glorian Mcdonell was evaluated in Emergency Department on 10/06/2020 for the symptoms described in the history of present illness. She was evaluated in the context of the global COVID-19 pandemic, which necessitated consideration that the patient might be at risk for infection with the SARS-CoV-2 virus that causes COVID-19. Institutional protocols and algorithms that pertain to the evaluation of patients at risk for COVID-19 are in a state of rapid change based on information released by regulatory bodies including the CDC and federal and state organizations. These policies and algorithms were followed during the patient's care in the ED.      Final Clinical Impression(s) / ED  Diagnoses  Return for intractable cough, coughing up blood, fevers > 100.4 unrelieved by medication, shortness of breath, intractable vomiting, chest pain, shortness of breath, weakness, numbness, changes in speech, facial asymmetry, abdominal pain, passing out, Inability to tolerate liquids or food, cough, altered mental status or any concerns. No signs of systemic illness or infection. The patient is nontoxic-appearing on exam and vital signs are within normal limits. I have reviewed  the triage vital signs and the nursing notes. Pertinent labs & imaging results that were available during my care of the patient were reviewed by me and considered in my medical decision making (see chart for details). After history, exam, and medical workup I feel the patient has been appropriately medically screened and is safe for discharge home. Pertinent diagnoses were discussed with the patient. Patient was given return precautions.     Nava Song, MD 10/06/20 [id number]

## 2020-10-07 DIAGNOSIS — Z20822 Contact with and (suspected) exposure to covid-19: Secondary | ICD-10-CM | POA: Diagnosis not present

## 2020-10-10 DIAGNOSIS — T8453XA Infection and inflammatory reaction due to internal right knee prosthesis, initial encounter: Secondary | ICD-10-CM | POA: Diagnosis not present

## 2020-10-10 DIAGNOSIS — F418 Other specified anxiety disorders: Secondary | ICD-10-CM | POA: Diagnosis not present

## 2020-10-10 DIAGNOSIS — E119 Type 2 diabetes mellitus without complications: Secondary | ICD-10-CM | POA: Diagnosis not present

## 2020-10-10 DIAGNOSIS — E782 Mixed hyperlipidemia: Secondary | ICD-10-CM | POA: Diagnosis not present

## 2020-10-10 DIAGNOSIS — I480 Paroxysmal atrial fibrillation: Secondary | ICD-10-CM | POA: Diagnosis not present

## 2020-10-10 DIAGNOSIS — E039 Hypothyroidism, unspecified: Secondary | ICD-10-CM | POA: Diagnosis not present

## 2020-10-10 NOTE — Telephone Encounter (Signed)
Patient stated that this issue has been resolved.

## 2020-10-11 DIAGNOSIS — E119 Type 2 diabetes mellitus without complications: Secondary | ICD-10-CM | POA: Diagnosis not present

## 2020-10-11 DIAGNOSIS — I480 Paroxysmal atrial fibrillation: Secondary | ICD-10-CM | POA: Diagnosis not present

## 2020-10-11 DIAGNOSIS — F418 Other specified anxiety disorders: Secondary | ICD-10-CM | POA: Diagnosis not present

## 2020-10-11 DIAGNOSIS — E782 Mixed hyperlipidemia: Secondary | ICD-10-CM | POA: Diagnosis not present

## 2020-10-11 DIAGNOSIS — E039 Hypothyroidism, unspecified: Secondary | ICD-10-CM | POA: Diagnosis not present

## 2020-10-11 DIAGNOSIS — T8453XA Infection and inflammatory reaction due to internal right knee prosthesis, initial encounter: Secondary | ICD-10-CM | POA: Diagnosis not present

## 2020-10-12 DIAGNOSIS — F418 Other specified anxiety disorders: Secondary | ICD-10-CM | POA: Diagnosis not present

## 2020-10-12 DIAGNOSIS — T8453XA Infection and inflammatory reaction due to internal right knee prosthesis, initial encounter: Secondary | ICD-10-CM | POA: Diagnosis not present

## 2020-10-12 DIAGNOSIS — E039 Hypothyroidism, unspecified: Secondary | ICD-10-CM | POA: Diagnosis not present

## 2020-10-12 DIAGNOSIS — E782 Mixed hyperlipidemia: Secondary | ICD-10-CM | POA: Diagnosis not present

## 2020-10-12 DIAGNOSIS — I480 Paroxysmal atrial fibrillation: Secondary | ICD-10-CM | POA: Diagnosis not present

## 2020-10-12 DIAGNOSIS — E119 Type 2 diabetes mellitus without complications: Secondary | ICD-10-CM | POA: Diagnosis not present

## 2020-10-16 ENCOUNTER — Telehealth: Payer: Self-pay

## 2020-10-16 ENCOUNTER — Emergency Department (HOSPITAL_COMMUNITY)
Admission: EM | Admit: 2020-10-16 | Discharge: 2020-10-16 | Disposition: A | Discharge status: Home / Self Care | Payer: Medicare Other | Attending: Emergency Medicine | Admitting: Emergency Medicine

## 2020-10-16 ENCOUNTER — Ambulatory Visit (INDEPENDENT_AMBULATORY_CARE_PROVIDER_SITE_OTHER): Payer: Medicare Other | Admitting: Internal Medicine

## 2020-10-16 ENCOUNTER — Other Ambulatory Visit: Payer: Medicare Other

## 2020-10-16 ENCOUNTER — Encounter (HOSPITAL_COMMUNITY): Payer: Self-pay

## 2020-10-16 ENCOUNTER — Other Ambulatory Visit: Payer: Self-pay

## 2020-10-16 ENCOUNTER — Other Ambulatory Visit (HOSPITAL_COMMUNITY): Payer: Self-pay

## 2020-10-16 VITALS — BP 120/77 | HR 69 | Temp 97.9°F | Resp 16 | Ht 63.5 in | Wt 130.0 lb

## 2020-10-16 DIAGNOSIS — L270 Generalized skin eruption due to drugs and medicaments taken internally: Secondary | ICD-10-CM | POA: Diagnosis not present

## 2020-10-16 DIAGNOSIS — T82594A Other mechanical complication of infusion catheter, initial encounter: Secondary | ICD-10-CM | POA: Insufficient documentation

## 2020-10-16 DIAGNOSIS — E039 Hypothyroidism, unspecified: Secondary | ICD-10-CM | POA: Diagnosis not present

## 2020-10-16 DIAGNOSIS — L282 Other prurigo: Secondary | ICD-10-CM

## 2020-10-16 DIAGNOSIS — T8453XA Infection and inflammatory reaction due to internal right knee prosthesis, initial encounter: Secondary | ICD-10-CM | POA: Diagnosis not present

## 2020-10-16 DIAGNOSIS — E782 Mixed hyperlipidemia: Secondary | ICD-10-CM | POA: Diagnosis not present

## 2020-10-16 DIAGNOSIS — I480 Paroxysmal atrial fibrillation: Secondary | ICD-10-CM | POA: Diagnosis not present

## 2020-10-16 DIAGNOSIS — Y848 Other medical procedures as the cause of abnormal reaction of the patient, or of later complication, without mention of misadventure at the time of the procedure: Secondary | ICD-10-CM | POA: Diagnosis not present

## 2020-10-16 DIAGNOSIS — F418 Other specified anxiety disorders: Secondary | ICD-10-CM | POA: Diagnosis not present

## 2020-10-16 DIAGNOSIS — T8453XD Infection and inflammatory reaction due to internal right knee prosthesis, subsequent encounter: Secondary | ICD-10-CM

## 2020-10-16 DIAGNOSIS — Z5321 Procedure and treatment not carried out due to patient leaving prior to being seen by health care provider: Secondary | ICD-10-CM | POA: Insufficient documentation

## 2020-10-16 DIAGNOSIS — E119 Type 2 diabetes mellitus without complications: Secondary | ICD-10-CM | POA: Diagnosis not present

## 2020-10-16 MED ORDER — HYDROXYZINE HCL 10 MG PO TABS: 10.0000 mg | ORAL_TABLET | Freq: Three times a day (TID) | ORAL | 0 refills | Status: DC | PRN

## 2020-10-16 MED ORDER — TRIAMCINOLONE ACETONIDE 0.025 % EX CREA: 1.0000 "application " | TOPICAL_CREAM | Freq: Two times a day (BID) | CUTANEOUS | 1 refills | Status: DC

## 2020-10-16 NOTE — Telephone Encounter (Signed)
Spoke to Chief Strategy Officer at [facility] and had her add CK level

## 2020-10-16 NOTE — Progress Notes (Signed)
Patient had me paged at roughly 715 pm. She had seen Dr Baxter Flattery earlier in the day w concerns for adverse drug rxn allergic rxn to antibiotics she was receiving via picc. Since then she went home and after dressing change by home Health RN  w "alcohol containing cleaning agent" she began to experience ongoing burning at site as well as pain in her arm including plateful   My main concerns re possible "worst case scenarios " were  for DVT vs PICC site infection.  I advised that going to ER for evaluation of possible DVT was safest option. Alternatively she could wait to be assessed in a clinic such as our own. She appears in ER now and I note in her history of DVT noted

## 2020-10-16 NOTE — Telephone Encounter (Signed)
Per Dr. Baxter Flattery: Patient is changing to Daptomycin 480 mg daily. Per Ladona Ridgel she will need first dose at Short Stay. After checking with Ileene Patrick on authorization and medication costs I was told it will cost average of $30.59 per 500 mg vial. I then received an email from Carolynn Sayers who reported that the patient will have to pay over $2,000.00 for copays if not done outpatient. I advised Pam and her team that I am sending this to [facility] Triage after speaking to [facility] for assistance. Also asked Carolynn Sayers and team to use epic not email to discuss patient finances so this will remain in chart.

## 2020-10-16 NOTE — ED Triage Notes (Signed)
Pt got her picc line September 14, 2020. Today after the dressing change she noticed that it was red and burning at the insertion site.

## 2020-10-16 NOTE — Progress Notes (Signed)
RFV: hospital follow up for PJI  Patient ID: Crystal Russell, female   DOB: 1950/11/07, 70 y.o.   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 Discharged on cefazolin plus start oral rifampin 300mg  BID x 6 weeks; she had episode of palpitations in early July that she was concerned that it was due to iv abtx.ED work up revealed she had elevated TSH- no afib. Referred back to PCP. End date of IV abtx is on July 22nd. At this visit, she reports having diffuse itching for the past 4 days. She is noticing a slight raised Rash to torso. No new detergents  Has thrush - nystatin finished  Family hx: her children have uc.     Outpatient Encounter Medications as of 10/16/2020  Medication Sig   acetaminophen (TYLENOL) 500 MG tablet Take 1,000 mg by mouth in the morning and at bedtime.   aspirin EC 81 MG tablet Take 81 mg by mouth daily. Swallow whole.   atorvastatin (LIPITOR) 10 MG tablet Take 0.5 tablets (5 mg total) by mouth daily.   Cholecalciferol (PA VITAMIN D-3) 50 MCG (2000 UT) CAPS Take 4,000 Units by mouth every evening.   Coenzyme Q10 300 MG CAPS Take 300 mg by mouth every evening.   levothyroxine (SYNTHROID) 112 MCG tablet Take 1 tablet (112 mcg total) by mouth daily before breakfast.   nystatin (MYCOSTATIN) 100000 UNIT/ML suspension Take 5 mLs (500,000 Units total) by mouth 4 (four) times daily.   vitamin B-12 (CYANOCOBALAMIN) 1000 MCG tablet Take 1,000 mcg by mouth every evening.   [DISCONTINUED] rifampin (RIFADIN) 300 MG capsule Take 1 capsule (300 mg total) by mouth 2 (two) times daily.   ceFAZolin (ANCEF) 10 g injection    rifampin (RIFADIN) 150 MG capsule rifampin   No facility-administered encounter medications on file as of 10/16/2020.     Patient Active Problem List   Diagnosis Date Noted   Infection of prosthetic right knee joint ([place]) 09/14/2020   PAC (premature atrial contraction) 07/28/2020    PAF (paroxysmal atrial fibrillation) ([place]) 05/01/2020   Mild aortic valve regurgitation 05/01/2020   Mild mitral regurgitation 05/01/2020   PFO (patent foramen ovale) 05/01/2020   Laceration of skin of right hand 03/19/2020   Diverticulitis 03/16/2020   Hyperglycemia 03/16/2020   Vertigo 09/14/2019   Status post total right knee replacement 07/02/2017   S/P TKR (total knee replacement) 07/01/2017   Arthritis, septic, knee ([place]) 06/16/2017   Postmenopausal bleeding 12/06/2015   Varicose veins of bilateral lower extremities with other complications 85/27/7824   Sun-damaged skin 12/04/2014   Breast mass, left 05/16/2014   Varicose veins of lower extremities with other complications 23/53/6144   Preventative health care 08/08/2013   Hypothyroidism 08/08/2013   Atrophic vaginitis 08/08/2013   Cervical cancer screening 08/05/2013   ETD (eustachian tube dysfunction) 05/26/2013   Depression with anxiety 12/27/2012   Ventral hernia 08/05/2012   Varicose veins 08/05/2012   DJD (degenerative joint disease) 08/05/2012   Constipation 08/05/2012   Hyperlipidemia, mixed 08/05/2012   H/O gestational diabetes mellitus, not currently pregnant 08/05/2012   Chicken pox    Shingles    Paroxysmal A-fib ([place])    Heart murmur    Spondylisthesis      Health Maintenance Due  Topic Date Due   Hepatitis C Screening  Never done   Zoster Vaccines- Shingrix (1 of 2) Never done   COLONOSCOPY (Pts 45-67yrs  Insurance coverage will need to be confirmed)  07/15/2018   COVID-19 Vaccine (4 - Booster for Pfizer series) 05/26/2020     Review of Systems 12 point ros except what is mentioned above Physical Exam  BP 120/77   Pulse 69   Temp 97.9 F (36.6 C)   Resp 16   Ht 5' 3.5" (1.613 m)   Wt 130 lb (59 kg)   SpO2 98%   BMI 22.67 kg/m  Physical Exam  Constitutional:  oriented to person, place, and time. appears well-developed and well-nourished. No distress.  HENT: /AT, PERRLA, no scleral  icterus Mouth/Throat: Oropharynx is clear and moist. No oropharyngeal exudate.  Cardiovascular: Normal rate, regular rhythm and normal heart sounds. Exam reveals no gallop and no friction rub.  No murmur heard.  Pulmonary/Chest: Effort normal and breath sounds normal. No respiratory distress.  has no wheezes.  Abd/back = raised papular rash, no necessarily erythematous (c/w maculopapular rash) Neurological: alert and oriented to person, place, and time.  Skin: Skin is warm and dry. No rash noted. No erythema. Some erythema from previous tape edge Psychiatric: a normal mood and affect.  behavior is normal.    CBC Lab Results  Component Value Date   WBC 3.9 (L) 10/05/2020   RBC 3.98 10/05/2020   HGB 11.4 (L) 10/05/2020   HCT 34.6 (L) 10/05/2020   PLT 259 10/05/2020   MCV 86.9 10/05/2020   MCH 28.6 10/05/2020   MCHC 32.9 10/05/2020   RDW 12.4 10/05/2020   LYMPHSABS 0.9 10/05/2020   MONOABS 0.4 10/05/2020   EOSABS 0.1 10/05/2020    BMET Lab Results  Component Value Date   NA 134 (L) 10/05/2020   K 3.8 10/05/2020   CL 99 10/05/2020   CO2 27 10/05/2020   GLUCOSE 115 (H) 10/05/2020   BUN 15 10/05/2020   CREATININE 0.53 10/05/2020   CALCIUM 9.2 10/05/2020   GFRNONAA >60 10/05/2020   GFRAA >60 09/19/2019   Lab Results  Component Value Date   ESRSEDRATE 30 09/25/2020   Review of recent lab work starting to show some eosinophilia   Assessment and Plan  Initial Plan to continue iv abtx through July 22nd then convert to oral abtx for additional 6 wk to complete a total of 3 months vs. Consider extending for 6 months- if she can tolerate.  We will evaluate at that 12 week mark if further extention of oral abtx suppression is needed  Drug inducted Allergy likely to abtx . Will stop both cefazolin and rifampin. Once acute pruritis and rash resolve, will discuss what oral abtx to use for suppression. For the time being we will do Triamcinolone cream; plus give her tablets for  atarax to help with pruritic rash.  Will switch to 12 more days of  vancomycin, ( daptomycin. - too difficult to coordinate in timely manner)  Will see back in 12-14 days to transition to orals  Will need to call dr olin's office to get staph mutans sensitivity - does not appear to have been scanned in chart

## 2020-10-17 ENCOUNTER — Other Ambulatory Visit: Payer: Self-pay

## 2020-10-17 ENCOUNTER — Ambulatory Visit: Payer: Medicare Other

## 2020-10-17 ENCOUNTER — Other Ambulatory Visit: Payer: Self-pay | Admitting: Family Medicine

## 2020-10-17 ENCOUNTER — Telehealth: Payer: Self-pay | Admitting: Infectious Disease

## 2020-10-17 DIAGNOSIS — I480 Paroxysmal atrial fibrillation: Secondary | ICD-10-CM | POA: Diagnosis not present

## 2020-10-17 DIAGNOSIS — G43909 Migraine, unspecified, not intractable, without status migrainosus: Secondary | ICD-10-CM | POA: Diagnosis not present

## 2020-10-17 DIAGNOSIS — Z86718 Personal history of other venous thrombosis and embolism: Secondary | ICD-10-CM | POA: Diagnosis not present

## 2020-10-17 DIAGNOSIS — Q211 Atrial septal defect: Secondary | ICD-10-CM | POA: Diagnosis not present

## 2020-10-17 DIAGNOSIS — E039 Hypothyroidism, unspecified: Secondary | ICD-10-CM | POA: Diagnosis not present

## 2020-10-17 DIAGNOSIS — M4317 Spondylolisthesis, lumbosacral region: Secondary | ICD-10-CM | POA: Diagnosis not present

## 2020-10-17 DIAGNOSIS — Z87891 Personal history of nicotine dependence: Secondary | ICD-10-CM | POA: Diagnosis not present

## 2020-10-17 DIAGNOSIS — K439 Ventral hernia without obstruction or gangrene: Secondary | ICD-10-CM | POA: Diagnosis not present

## 2020-10-17 DIAGNOSIS — E119 Type 2 diabetes mellitus without complications: Secondary | ICD-10-CM | POA: Diagnosis not present

## 2020-10-17 DIAGNOSIS — I080 Rheumatic disorders of both mitral and aortic valves: Secondary | ICD-10-CM | POA: Diagnosis not present

## 2020-10-17 DIAGNOSIS — Z452 Encounter for adjustment and management of vascular access device: Secondary | ICD-10-CM | POA: Diagnosis not present

## 2020-10-17 DIAGNOSIS — Z792 Long term (current) use of antibiotics: Secondary | ICD-10-CM | POA: Diagnosis not present

## 2020-10-17 DIAGNOSIS — E782 Mixed hyperlipidemia: Secondary | ICD-10-CM | POA: Diagnosis not present

## 2020-10-17 DIAGNOSIS — I83893 Varicose veins of bilateral lower extremities with other complications: Secondary | ICD-10-CM | POA: Diagnosis not present

## 2020-10-17 DIAGNOSIS — T8453XA Infection and inflammatory reaction due to internal right knee prosthesis, initial encounter: Secondary | ICD-10-CM | POA: Diagnosis not present

## 2020-10-17 DIAGNOSIS — Z9181 History of falling: Secondary | ICD-10-CM | POA: Diagnosis not present

## 2020-10-17 DIAGNOSIS — Z7982 Long term (current) use of aspirin: Secondary | ICD-10-CM | POA: Diagnosis not present

## 2020-10-17 DIAGNOSIS — T8453XD Infection and inflammatory reaction due to internal right knee prosthesis, subsequent encounter: Secondary | ICD-10-CM

## 2020-10-17 DIAGNOSIS — E663 Overweight: Secondary | ICD-10-CM | POA: Diagnosis not present

## 2020-10-17 DIAGNOSIS — I491 Atrial premature depolarization: Secondary | ICD-10-CM | POA: Diagnosis not present

## 2020-10-17 DIAGNOSIS — Z6822 Body mass index (BMI) 22.0-22.9, adult: Secondary | ICD-10-CM | POA: Diagnosis not present

## 2020-10-17 DIAGNOSIS — F418 Other specified anxiety disorders: Secondary | ICD-10-CM | POA: Diagnosis not present

## 2020-10-17 NOTE — Progress Notes (Signed)
Patient contacted team with concerns about her PICC line. (See patient messages). RN assessed PICC site which showed mild irritation under biopatch and some redness. No streaking noticed or discharge. Irritation was relieved with Benadryl last night. Similar irritation has been see with other patients who have the gel biopatch. RN contacted Stanton Kidney with [facility] to request biopatch pad instead of gel to decrease irritation. Family to pick up PICC kits with different patch for next dressing change. RN instructed patient to continue benadryl as needed to assist with irritation and itching.  Genevieve Arbaugh Lorita Officer, RN

## 2020-10-17 NOTE — Telephone Encounter (Signed)
See my HiQ generated note from last night.  This patient called with concerns re her PICC line.  Apparently had an allergic reaction to antibiotic she was receiving previously and had seen Dr. Graylon Good yesterday.  She says the nurse changed her dressing and cleaned it with an alcohol-based cleaner.  Since then she experienced burning in her arm but also pain that radiated posteriorly.  She does have a history of DVT I told her the most safe thing to do would be good to go to the emergency department.  Looks as if she went but I am not sure if she was ever seen she has sent mychart images re the appearance of PICC

## 2020-10-18 ENCOUNTER — Telehealth: Payer: Self-pay

## 2020-10-18 NOTE — Telephone Encounter (Signed)
Verbal orders given to [facility] with [facility] to switch patient from Daptomycin to Vancomycin 1054m (initial dose and titrate per pharmacy protocol) due to costs/delay in treatment. Weekly trough, BMP, CRP, ESR and CBC ordered. End date of 7/29 given  [place]. She will receive the initial dose in her home as she has been on vanc before.   Patient notified of appointment cancellation and treatment plan. RN will follow up as needed.   Sabryna Lahm RLorita Officer RN

## 2020-10-20 ENCOUNTER — Telehealth: Payer: Self-pay | Admitting: Internal Medicine

## 2020-10-20 DIAGNOSIS — E119 Type 2 diabetes mellitus without complications: Secondary | ICD-10-CM | POA: Diagnosis not present

## 2020-10-20 DIAGNOSIS — F418 Other specified anxiety disorders: Secondary | ICD-10-CM | POA: Diagnosis not present

## 2020-10-20 DIAGNOSIS — E039 Hypothyroidism, unspecified: Secondary | ICD-10-CM | POA: Diagnosis not present

## 2020-10-20 DIAGNOSIS — E782 Mixed hyperlipidemia: Secondary | ICD-10-CM | POA: Diagnosis not present

## 2020-10-20 DIAGNOSIS — I480 Paroxysmal atrial fibrillation: Secondary | ICD-10-CM | POA: Diagnosis not present

## 2020-10-20 DIAGNOSIS — T8453XA Infection and inflammatory reaction due to internal right knee prosthesis, initial encounter: Secondary | ICD-10-CM | POA: Diagnosis not present

## 2020-10-20 NOTE — Telephone Encounter (Signed)
I spoke to patient to see how she is tolerating abtx and if there is further issues with picc line.  She had itching about picc line suspect dermatitis to the gel patch. She is now switched to biopatch  She has had 2 doses of vancomycin and tolerated thus far (dapto cost prohibitive and time delay)  Plan to continue for 14 days of vancomycin then convert to orals for suppression for staph mutans prosthetic joint infection.  Elzie Rings [place] for Infectious Diseases [phone]

## 2020-10-22 ENCOUNTER — Other Ambulatory Visit: Payer: Self-pay | Admitting: Internal Medicine

## 2020-10-23 ENCOUNTER — Telehealth: Payer: Self-pay

## 2020-10-23 DIAGNOSIS — T8453XA Infection and inflammatory reaction due to internal right knee prosthesis, initial encounter: Secondary | ICD-10-CM | POA: Diagnosis not present

## 2020-10-23 DIAGNOSIS — E039 Hypothyroidism, unspecified: Secondary | ICD-10-CM | POA: Diagnosis not present

## 2020-10-23 DIAGNOSIS — I480 Paroxysmal atrial fibrillation: Secondary | ICD-10-CM | POA: Diagnosis not present

## 2020-10-23 DIAGNOSIS — A419 Sepsis, unspecified organism: Secondary | ICD-10-CM | POA: Diagnosis not present

## 2020-10-23 DIAGNOSIS — E782 Mixed hyperlipidemia: Secondary | ICD-10-CM | POA: Diagnosis not present

## 2020-10-23 DIAGNOSIS — E119 Type 2 diabetes mellitus without complications: Secondary | ICD-10-CM | POA: Diagnosis not present

## 2020-10-23 DIAGNOSIS — F418 Other specified anxiety disorders: Secondary | ICD-10-CM | POA: Diagnosis not present

## 2020-10-23 NOTE — Telephone Encounter (Signed)
Received call from [place] wanting to clarify a day's supply for the triamcinolone cream for insurance purposes. Per Magda Kiel, [place] notified [facility] to use 30 day supply.   Beryle Flock, RN

## 2020-10-24 NOTE — Telephone Encounter (Signed)
I don't think it is related to antibiotics personally. How many doses has she had of vancomycin so far? Usually it would happen as it is infusing, but not totally out of the question to be outside of that time frame.

## 2020-10-26 ENCOUNTER — Encounter (HOSPITAL_COMMUNITY): Payer: Medicare Other

## 2020-10-26 DIAGNOSIS — E119 Type 2 diabetes mellitus without complications: Secondary | ICD-10-CM | POA: Diagnosis not present

## 2020-10-26 DIAGNOSIS — E782 Mixed hyperlipidemia: Secondary | ICD-10-CM | POA: Diagnosis not present

## 2020-10-26 DIAGNOSIS — A419 Sepsis, unspecified organism: Secondary | ICD-10-CM | POA: Diagnosis not present

## 2020-10-26 DIAGNOSIS — F418 Other specified anxiety disorders: Secondary | ICD-10-CM | POA: Diagnosis not present

## 2020-10-26 DIAGNOSIS — I480 Paroxysmal atrial fibrillation: Secondary | ICD-10-CM | POA: Diagnosis not present

## 2020-10-26 DIAGNOSIS — E039 Hypothyroidism, unspecified: Secondary | ICD-10-CM | POA: Diagnosis not present

## 2020-10-26 DIAGNOSIS — T8453XA Infection and inflammatory reaction due to internal right knee prosthesis, initial encounter: Secondary | ICD-10-CM | POA: Diagnosis not present

## 2020-10-27 ENCOUNTER — Telehealth: Payer: Self-pay | Admitting: Family Medicine

## 2020-10-27 NOTE — Telephone Encounter (Signed)
Do you think you may need anything for her?

## 2020-10-27 NOTE — Telephone Encounter (Signed)
The patient has a nurse from [place] that goes to her house to draw blood. The nurse will be there next Thursday and Pt has an appt here Friday. Crystal Russell would like to know if we could send an order to [facility] for them to draw blood.    [facility] nurse is Denisse her phone number is [phone]

## 2020-10-30 DIAGNOSIS — I480 Paroxysmal atrial fibrillation: Secondary | ICD-10-CM | POA: Diagnosis not present

## 2020-10-30 DIAGNOSIS — E039 Hypothyroidism, unspecified: Secondary | ICD-10-CM | POA: Diagnosis not present

## 2020-10-30 DIAGNOSIS — E782 Mixed hyperlipidemia: Secondary | ICD-10-CM | POA: Diagnosis not present

## 2020-10-30 DIAGNOSIS — E119 Type 2 diabetes mellitus without complications: Secondary | ICD-10-CM | POA: Diagnosis not present

## 2020-10-30 DIAGNOSIS — T8453XA Infection and inflammatory reaction due to internal right knee prosthesis, initial encounter: Secondary | ICD-10-CM | POA: Diagnosis not present

## 2020-10-30 DIAGNOSIS — F418 Other specified anxiety disorders: Secondary | ICD-10-CM | POA: Diagnosis not present

## 2020-10-30 DIAGNOSIS — A419 Sepsis, unspecified organism: Secondary | ICD-10-CM | POA: Diagnosis not present

## 2020-10-30 NOTE — Telephone Encounter (Signed)
Spoke with Langley Gauss she will draw on Thursday and fax results over.  Patient notified.

## 2020-11-01 DIAGNOSIS — T8453XA Infection and inflammatory reaction due to internal right knee prosthesis, initial encounter: Secondary | ICD-10-CM | POA: Diagnosis not present

## 2020-11-01 DIAGNOSIS — I480 Paroxysmal atrial fibrillation: Secondary | ICD-10-CM | POA: Diagnosis not present

## 2020-11-01 DIAGNOSIS — E119 Type 2 diabetes mellitus without complications: Secondary | ICD-10-CM | POA: Diagnosis not present

## 2020-11-01 DIAGNOSIS — E782 Mixed hyperlipidemia: Secondary | ICD-10-CM | POA: Diagnosis not present

## 2020-11-01 DIAGNOSIS — E039 Hypothyroidism, unspecified: Secondary | ICD-10-CM | POA: Diagnosis not present

## 2020-11-01 DIAGNOSIS — F418 Other specified anxiety disorders: Secondary | ICD-10-CM | POA: Diagnosis not present

## 2020-11-02 DIAGNOSIS — E039 Hypothyroidism, unspecified: Secondary | ICD-10-CM | POA: Diagnosis not present

## 2020-11-02 DIAGNOSIS — I480 Paroxysmal atrial fibrillation: Secondary | ICD-10-CM | POA: Diagnosis not present

## 2020-11-02 DIAGNOSIS — F418 Other specified anxiety disorders: Secondary | ICD-10-CM | POA: Diagnosis not present

## 2020-11-02 DIAGNOSIS — A419 Sepsis, unspecified organism: Secondary | ICD-10-CM | POA: Diagnosis not present

## 2020-11-02 DIAGNOSIS — T8453XA Infection and inflammatory reaction due to internal right knee prosthesis, initial encounter: Secondary | ICD-10-CM | POA: Diagnosis not present

## 2020-11-02 DIAGNOSIS — Z471 Aftercare following joint replacement surgery: Secondary | ICD-10-CM | POA: Diagnosis not present

## 2020-11-02 DIAGNOSIS — Z96651 Presence of right artificial knee joint: Secondary | ICD-10-CM | POA: Diagnosis not present

## 2020-11-02 DIAGNOSIS — E119 Type 2 diabetes mellitus without complications: Secondary | ICD-10-CM | POA: Diagnosis not present

## 2020-11-02 DIAGNOSIS — E782 Mixed hyperlipidemia: Secondary | ICD-10-CM | POA: Diagnosis not present

## 2020-11-03 ENCOUNTER — Ambulatory Visit (INDEPENDENT_AMBULATORY_CARE_PROVIDER_SITE_OTHER): Payer: Medicare Other | Admitting: Internal Medicine

## 2020-11-03 ENCOUNTER — Telehealth: Payer: Self-pay

## 2020-11-03 ENCOUNTER — Other Ambulatory Visit: Payer: Medicare Other

## 2020-11-03 ENCOUNTER — Encounter: Payer: Self-pay | Admitting: Internal Medicine

## 2020-11-03 ENCOUNTER — Other Ambulatory Visit: Payer: Self-pay

## 2020-11-03 VITALS — BP 108/73 | HR 69 | Temp 98.5°F | Wt 130.0 lb

## 2020-11-03 DIAGNOSIS — T8450XD Infection and inflammatory reaction due to unspecified internal joint prosthesis, subsequent encounter: Secondary | ICD-10-CM

## 2020-11-03 MED ORDER — LINEZOLID 600 MG PO TABS: 600.0000 mg | ORAL_TABLET | Freq: Two times a day (BID) | ORAL | 0 refills | Status: DC

## 2020-11-03 NOTE — Progress Notes (Signed)
[place] for Infectious Disease  Patient Active Problem List   Diagnosis Date Noted   Infection of prosthetic right knee joint ([place]) 09/14/2020   PAC (premature atrial contraction) 07/28/2020   PAF (paroxysmal atrial fibrillation) ([place]) 05/01/2020   Mild aortic valve regurgitation 05/01/2020   Mild mitral regurgitation 05/01/2020   PFO (patent foramen ovale) 05/01/2020   Laceration of skin of right hand 03/19/2020   Diverticulitis 03/16/2020   Hyperglycemia 03/16/2020   Vertigo 09/14/2019   Status post total right knee replacement 07/02/2017   S/P TKR (total knee replacement) 07/01/2017   Arthritis, septic, knee ([place]) 06/16/2017   Postmenopausal bleeding 12/06/2015   Varicose veins of bilateral lower extremities with other complications [id number]   Sun-damaged skin 12/04/2014   Breast mass, left 05/16/2014   Varicose veins of lower extremities with other complications [id number]   Preventative health care 08/08/2013   Hypothyroidism 08/08/2013   Atrophic vaginitis 08/08/2013   Cervical cancer screening 08/05/2013   ETD (eustachian tube dysfunction) 05/26/2013   Depression with anxiety 12/27/2012   Ventral hernia 08/05/2012   Varicose veins 08/05/2012   DJD (degenerative joint disease) 08/05/2012   Constipation 08/05/2012   Hyperlipidemia, mixed 08/05/2012   H/O gestational diabetes mellitus, not currently pregnant 08/05/2012   Chicken pox    Shingles    Paroxysmal A-fib ([place])    Heart murmur    Spondylisthesis       Subjective:    Patient ID: Crystal Russell, female    DOB: September 23, 1950, 70 y.o.   MRN: [id number]  Chief Complaint  Patient presents with   Follow-up   Cc: f/u staph mutans PJI right knee  HPI:  Crystal Russell is a 70 y.o. female here for f/u right prosthetic knee infection  Reviewed dr snider's note from 7/11: S/p I&D polyexchange 6/10; cx staph mutan. Discharged on cefazolin/rifampin plan until 7/22 then transition to 3-6 months  oral antibiotics Patient developed a rash thought to be abx related, both cefazolin/rifampin stopped on 7/11 and transitioned to vancomycin plan for 14 days prior to transitioning to oral abx  We don't have sensitivity of the staph mutan from Dr Aurea Graff office yet. Will call to his clinic today  Patient without rash/n/v/diarrhea/f/c   She has some diarrhea. Up to 12 a day yesterday. Today 2 times. She has intact appetite although not baseline. She feels exhausted but not because of the diarrhea but other factors as well which she doesn't explain No rash Intermittent nausea No vomiting  Exercising -- 4/10 pain; nonweight bearing 4/10 Saw dr Alvan Dame yesterday 7/28 and said "she can do whatever she wants with the knee now" in terms of activities.  She is concerned about her thyroid level. Her tsh is normal     Allergies  Allergen Reactions   Crestor [Rosuvastatin] Other (See Comments)    Joint pain w/fibromyalgia   Pravachol [Pravastatin] Other (See Comments)    Joint pain w/fibromyalgia   Cefazolin Rash      Outpatient Medications Prior to Visit  Medication Sig Dispense Refill   acetaminophen (TYLENOL) 500 MG tablet Take 1,000 mg by mouth in the morning and at bedtime.     aspirin EC 81 MG tablet Take 81 mg by mouth daily. Swallow whole.     atorvastatin (LIPITOR) 10 MG tablet Take 0.5 tablets (5 mg total) by mouth daily. 45 tablet 3   ceFAZolin (ANCEF) 10 g injection      Cholecalciferol (PA VITAMIN D-3) 50 MCG (2000  UT) CAPS Take 4,000 Units by mouth every evening.     Coenzyme Q10 300 MG CAPS Take 300 mg by mouth every evening.     hydrOXYzine (ATARAX/VISTARIL) 10 MG tablet Take 1 tablet (10 mg total) by mouth 3 (three) times daily as needed. 30 tablet 0   levothyroxine (SYNTHROID) 112 MCG tablet Take 1 tablet (112 mcg total) by mouth daily before breakfast. 90 tablet 1   nystatin (MYCOSTATIN) 100000 UNIT/ML suspension take 1 teaspoonsful (13ms) by mouth 4 times a day 200 mL 0    rifampin (RIFADIN) 150 MG capsule rifampin     triamcinolone (KENALOG) 0.025 % cream Apply 1 application topically 2 (two) times daily. To affected areas 80 g 1   vitamin B-12 (CYANOCOBALAMIN) 1000 MCG tablet Take 1,000 mcg by mouth every evening.     No facility-administered medications prior to visit.     Social History   Socioeconomic History   Marital status: Married    Spouse name: Not on file   Number of children: Not on file   Years of education: Not on file   Highest education level: Not on file  Occupational History   Not on file  Tobacco Use   Smoking status: Former    Years: 4.00    Types: Cigarettes   Smokeless tobacco: Never  Vaping Use   Vaping Use: Never used  Substance and Sexual Activity   Alcohol use: No   Drug use: No   Sexual activity: Yes  Other Topics Concern   Not on file  Social History Narrative   Not on file   Social Determinants of Health   Financial Resource Strain: Not on file  Food Insecurity: Not on file  Transportation Needs: Not on file  Physical Activity: Not on file  Stress: Not on file  Social Connections: Not on file  Intimate Partner Violence: Not on file      Review of Systems    Other ros negative   Objective:    Wt 130 lb (59 kg)   BMI 23.03 kg/m  Nursing note and vital signs reviewed.  Physical Exam     General/constitutional: no distress, pleasant HEENT: Normocephalic, PER, Conj Clear, EOMI, Oropharynx clear Neck supple CV: rrr no mrg Lungs: clear to auscultation, normal respiratory effort Abd: Soft, Nontender Ext: no edema Skin: No Rash Neuro: nonfocal MSK: no peripheral joint swelling/tenderness/warmth; back spines nontender; right knee warm and no effusion    Labs: [facility] 7/28 cbc 4/11/246; cr 0.6; lft wnl  Crp: 7/05   3 (<10)  Micro:  Serology:  Imaging:  Assessment & Plan:   Problem List Items Addressed This Visit   None Visit Diagnoses     Infection of prosthetic joint,  subsequent encounter    -  Primary   Relevant Medications   vancomycin (VANCOCIN) 10 G SOLR injection       We are awaiting the sensitivity of the staph mutans report still Patient is scheduled to finish vancomycin today  My plan is to keep her on vancomycin until we hear back from Dr OAurea Graffabout the sensitivity We need to let home helath know to give her more vancomcyin starting tomorrow  In the chance they might not be able to get vanc by Sunday, I will also prescribe patient 1 week of linezolid to take if the vancomycin haven't arrived  -will notify hh to send her 7 more days of vanc -rx for linezolid 1 week to take in case she hasn't received  vanc -f/u 1-2 weeks  -await dr Aurea Graff to send [place] cx result/sensitivity -f/u pcp about thyroid question    Follow-up: Return for 2 weeks with any provider.      Jabier Mutton, [place] for [place] [phone]  pager   [phone] cell 11/03/2020, 3:38 PM

## 2020-11-03 NOTE — Telephone Encounter (Signed)
Verbal orders given to [facility] per Dr.Vu - extend IV Vancomycin for another 7 days. Mary repeated verbal orders back to me and verbalized her understanding.     [place], CMA

## 2020-11-03 NOTE — Patient Instructions (Signed)
Go ahead and pickup linezolid and start taking tomorrow. Once the vancomycin arrives you can stop linezolid   Our goal is to get you on some other long term oral medication, once we hear from dr Aurea Graff office  See our clinic again in 1-2 weeks

## 2020-11-06 ENCOUNTER — Encounter: Payer: Self-pay | Admitting: Family Medicine

## 2020-11-06 ENCOUNTER — Telehealth: Payer: Self-pay | Admitting: Internal Medicine

## 2020-11-06 DIAGNOSIS — E039 Hypothyroidism, unspecified: Secondary | ICD-10-CM | POA: Diagnosis not present

## 2020-11-06 DIAGNOSIS — T8450XD Infection and inflammatory reaction due to unspecified internal joint prosthesis, subsequent encounter: Secondary | ICD-10-CM

## 2020-11-06 DIAGNOSIS — I480 Paroxysmal atrial fibrillation: Secondary | ICD-10-CM | POA: Diagnosis not present

## 2020-11-06 DIAGNOSIS — E782 Mixed hyperlipidemia: Secondary | ICD-10-CM | POA: Diagnosis not present

## 2020-11-06 DIAGNOSIS — T8453XA Infection and inflammatory reaction due to internal right knee prosthesis, initial encounter: Secondary | ICD-10-CM | POA: Diagnosis not present

## 2020-11-06 DIAGNOSIS — E119 Type 2 diabetes mellitus without complications: Secondary | ICD-10-CM | POA: Diagnosis not present

## 2020-11-06 DIAGNOSIS — F418 Other specified anxiety disorders: Secondary | ICD-10-CM | POA: Diagnosis not present

## 2020-11-06 DIAGNOSIS — A419 Sepsis, unspecified organism: Secondary | ICD-10-CM | POA: Diagnosis not present

## 2020-11-06 MED ORDER — CEFADROXIL 500 MG PO CAPS: 1000.0000 mg | ORAL_CAPSULE | Freq: Two times a day (BID) | ORAL | 5 refills | Status: DC

## 2020-11-06 NOTE — Telephone Encounter (Signed)
Patient with prosthetic joint infection s/p I&D and polyexchange   Received fax from [facility] on 8/01  6/08 synovial fluid sent to [facility] [phone]); to be scanned in the record  Synovasure id panel Staphylococcus species positive   Culture Strep mutans (S amp, pcn, levo, erythro, tetra)   Given the discrepancies between the antigen panel and culture, I called [facility] to confirm. They advise to go with the culture. They explained the staphylococcus detects antigen; there is no speciation. There is no sepearate strep panel (just staphylcoccus, p acnes, enterococcus, and candida)    A/p Right knee prosthetic joint infection; strep mutans  Currently on vanc   -stop vanc/remove picc -start cefadroxil 1 gram twice a day ([facility]) -- cefazolin listed as allergy but has received previously, different side chain to the pills should be ok -I do not trust doxycycline to work against strep so will avoid -f/u dr Baxter Flattery in 2-3 weeks  -dc picc and vancomycin  -dc home health -I have placed rx for cefadroxil to [facility] pharmacy

## 2020-11-06 NOTE — Telephone Encounter (Signed)
Spoke with patient to relay culture results, she states she has already seen them.   Advised her that Dr. Gale Journey would like for her to stop the vancomycin and that home health should be out to pull the PICC line.   Relayed that Dr. Gale Journey has ordered an oral antibiotic called cefadroxil that she is to start. She expresses concern over her allergy to cefazolin. Advised her to please reach out to the office if she has any concerns of mild allergic reaction such as rash. Instructed her to go to the emergency department if she develops hives, wheezing, difficulty breathing. Patient verbalized understanding and has no further questions.   RN relayed verbal orders to [facility] to stop IV Vancomycin and pull PICC per Dr. Gale Journey. Orders repeated and verified.   Beryle Flock, RN

## 2020-11-06 NOTE — Telephone Encounter (Signed)
Spoke with patient. She is willing to take cefadroxil. She was initially hesitant since she was concerned abtx was cause of worsening headache. (Previously on cefazolin and rifampin)   I reassured her that it is worth taking (that the HA may have been due to rifampin +/- stress) and seeing if any side effects.   We will have home health pull picc line on 8/2. Have her start taking cefadroxil 1gm BID tomorrow.  Crystal Russell for Infectious Diseases [phone]

## 2020-11-09 DIAGNOSIS — T8453XA Infection and inflammatory reaction due to internal right knee prosthesis, initial encounter: Secondary | ICD-10-CM | POA: Diagnosis not present

## 2020-11-09 DIAGNOSIS — E039 Hypothyroidism, unspecified: Secondary | ICD-10-CM | POA: Diagnosis not present

## 2020-11-09 DIAGNOSIS — F418 Other specified anxiety disorders: Secondary | ICD-10-CM | POA: Diagnosis not present

## 2020-11-09 DIAGNOSIS — E782 Mixed hyperlipidemia: Secondary | ICD-10-CM | POA: Diagnosis not present

## 2020-11-09 DIAGNOSIS — I480 Paroxysmal atrial fibrillation: Secondary | ICD-10-CM | POA: Diagnosis not present

## 2020-11-09 DIAGNOSIS — E119 Type 2 diabetes mellitus without complications: Secondary | ICD-10-CM | POA: Diagnosis not present

## 2020-11-13 DIAGNOSIS — T8453XA Infection and inflammatory reaction due to internal right knee prosthesis, initial encounter: Secondary | ICD-10-CM | POA: Diagnosis not present

## 2020-11-13 DIAGNOSIS — F418 Other specified anxiety disorders: Secondary | ICD-10-CM | POA: Diagnosis not present

## 2020-11-13 DIAGNOSIS — E782 Mixed hyperlipidemia: Secondary | ICD-10-CM | POA: Diagnosis not present

## 2020-11-13 DIAGNOSIS — E119 Type 2 diabetes mellitus without complications: Secondary | ICD-10-CM | POA: Diagnosis not present

## 2020-11-13 DIAGNOSIS — E039 Hypothyroidism, unspecified: Secondary | ICD-10-CM | POA: Diagnosis not present

## 2020-11-13 DIAGNOSIS — I480 Paroxysmal atrial fibrillation: Secondary | ICD-10-CM | POA: Diagnosis not present

## 2020-11-17 ENCOUNTER — Encounter: Payer: Self-pay | Admitting: Internal Medicine

## 2020-11-17 ENCOUNTER — Other Ambulatory Visit: Payer: Self-pay

## 2020-11-17 ENCOUNTER — Ambulatory Visit (INDEPENDENT_AMBULATORY_CARE_PROVIDER_SITE_OTHER): Payer: Medicare Other | Admitting: Internal Medicine

## 2020-11-17 VITALS — BP 120/70 | HR 60 | Temp 98.0°F

## 2020-11-17 DIAGNOSIS — E039 Hypothyroidism, unspecified: Secondary | ICD-10-CM | POA: Diagnosis not present

## 2020-11-17 DIAGNOSIS — M009 Pyogenic arthritis, unspecified: Secondary | ICD-10-CM

## 2020-11-17 NOTE — Patient Instructions (Signed)
Please get labs today  See dr Baxter Flattery in follow up around 8 weeks   Follow up with your regular doctor for review of your tsh  Continue cefadroxil

## 2020-11-17 NOTE — Progress Notes (Signed)
[facility]  Patient Active Problem List   Diagnosis Date Noted   Infection of prosthetic right knee joint ([place]) 09/14/2020   PAC (premature atrial contraction) 07/28/2020   PAF (paroxysmal atrial fibrillation) ([place]) 05/01/2020   Mild aortic valve regurgitation 05/01/2020   Mild mitral regurgitation 05/01/2020   PFO (patent foramen ovale) 05/01/2020   Laceration of skin of right hand 03/19/2020   Diverticulitis 03/16/2020   Hyperglycemia 03/16/2020   Vertigo 09/14/2019   Status post total right knee replacement 07/02/2017   S/P TKR (total knee replacement) 07/01/2017   Arthritis, septic, knee ([place]) 06/16/2017   Postmenopausal bleeding 12/06/2015   Varicose veins of bilateral lower extremities with other complications [id number]   Sun-damaged skin 12/04/2014   Breast mass, left 05/16/2014   Varicose veins of lower extremities with other complications [id number]   Preventative health care 08/08/2013   Hypothyroidism 08/08/2013   Atrophic vaginitis 08/08/2013   Cervical cancer screening 08/05/2013   ETD (eustachian tube dysfunction) 05/26/2013   Depression with anxiety 12/27/2012   Ventral hernia 08/05/2012   Varicose veins 08/05/2012   DJD (degenerative joint disease) 08/05/2012   Constipation 08/05/2012   Hyperlipidemia, mixed 08/05/2012   H/O gestational diabetes mellitus, not currently pregnant 08/05/2012   Chicken pox    Shingles    Paroxysmal A-fib ([place])    Heart murmur    Spondylisthesis       Subjective:    Patient ID: Crystal Russell, female    DOB: Feb 14, 1951, 70 y.o.   MRN: [id number]  Chief Complaint  Patient presents with   Follow-up    Infection of prosthetic joint   Cc: f/u staph mutans PJI right knee  HPI:  Crystal Russell is a 70 y.o. female here for f/u right prosthetic knee infection  Reviewed dr snider's note from 7/11: S/p I&D polyexchange 6/10; cx staph mutan. Discharged on cefazolin/rifampin plan until 7/22  then transition to 3-6 months oral antibiotics Patient developed a rash thought to be abx related, both cefazolin/rifampin stopped on 7/11 and transitioned to vancomycin plan for 14 days prior to transitioning to oral abx  We don't have sensitivity of the staph mutan from Dr Aurea Graff office yet. Will call to his clinic today  Patient without rash/n/v/diarrhea/f/c   She has some diarrhea. Up to 12 a day yesterday. Today 2 times. She has intact appetite although not baseline. She feels exhausted but not because of the diarrhea but other factors as well which she doesn't explain No rash Intermittent nausea No vomiting  Exercising -- 4/10 pain; nonweight bearing 4/10 Saw dr Alvan Dame yesterday 7/28 and said "she can do whatever she wants with the knee now" in terms of activities.  She is concerned about her thyroid level. Her tsh is normal   ---------- 11/17/2020  Patient is on cefadroxil now. Patient reports lightheadedness/being thirsty No rash/nausea/vomiting/diarrhea Right knee no swelling  Allergies  Allergen Reactions   Crestor [Rosuvastatin] Other (See Comments)    Joint pain w/fibromyalgia   Pravachol [Pravastatin] Other (See Comments)    Joint pain w/fibromyalgia   Cefazolin Rash      Outpatient Medications Prior to Visit  Medication Sig Dispense Refill   acetaminophen (TYLENOL) 500 MG tablet Take 1,000 mg by mouth in the morning and at bedtime.     aspirin EC 81 MG tablet Take 81 mg by mouth daily. Swallow whole.     atorvastatin (LIPITOR) 10 MG tablet Take 0.5 tablets (5 mg total) by  mouth daily. 45 tablet 3   cefadroxil (DURICEF) 500 MG capsule Take 2 capsules (1,000 mg total) by mouth 2 (two) times daily. 120 capsule 5   Cholecalciferol (PA VITAMIN D-3) 50 MCG (2000 UT) CAPS Take 4,000 Units by mouth every evening.     Coenzyme Q10 300 MG CAPS Take 300 mg by mouth every evening.     levothyroxine (SYNTHROID) 112 MCG tablet Take 1 tablet (112 mcg total) by mouth daily  before breakfast. 90 tablet 1   nystatin (MYCOSTATIN) 100000 UNIT/ML suspension take 1 teaspoonsful (34ms) by mouth 4 times a day 200 mL 0   vitamin B-12 (CYANOCOBALAMIN) 1000 MCG tablet Take 1,000 mcg by mouth every evening.     hydrOXYzine (ATARAX/VISTARIL) 10 MG tablet Take 1 tablet (10 mg total) by mouth 3 (three) times daily as needed. 30 tablet 0   rifampin (RIFADIN) 150 MG capsule rifampin (Patient not taking: Reported on 11/03/2020)     triamcinolone (KENALOG) 0.025 % cream Apply 1 application topically 2 (two) times daily. To affected areas 80 g 1   No facility-administered medications prior to visit.     Social History   Socioeconomic History   Marital status: Married    Spouse name: Not on file   Number of children: Not on file   Years of education: Not on file   Highest education level: Not on file  Occupational History   Not on file  Tobacco Use   Smoking status: Former    Years: 4.00    Types: Cigarettes   Smokeless tobacco: Never  Vaping Use   Vaping Use: Never used  Substance and Sexual Activity   Alcohol use: No   Drug use: No   Sexual activity: Yes  Other Topics Concern   Not on file  Social History Narrative   Not on file   Social Determinants of Health   Financial Resource Strain: Not on file  Food Insecurity: Not on file  Transportation Needs: Not on file  Physical Activity: Not on file  Stress: Not on file  Social Connections: Not on file  Intimate Partner Violence: Not on file      Review of Systems    Other ros negative   Objective:    BP 120/70   Pulse 60   Temp 98 F (36.7 C) (Oral)   SpO2 100%  Nursing note and vital signs reviewed.  Physical Exam     General/constitutional: no distress, pleasant HEENT: Normocephalic, PER, Conj Clear, EOMI, Oropharynx clear Neck supple CV: rrr no mrg Lungs: clear to auscultation, normal respiratory effort Abd: Soft, Nontender Ext: no edema Skin: No Rash Neuro: nonfocal MSK: no  peripheral joint swelling/tenderness/warmth; back spines nontender      Labs: [facility] 7/28 cbc 4/11/246; cr 0.6; lft wnl  Crp: 7/05   3 (<10)  Micro:  Serology:  Imaging:  Assessment & Plan:   Problem List Items Addressed This Visit       Endocrine   Hypothyroidism   Relevant Orders   TSH + free T4     Musculoskeletal and Integument   Arthritis, septic, knee ([place] - Primary   Relevant Orders   CBC w/Diff   Comprehensive metabolic panel   C-reactive protein   We are awaiting the sensitivity of the strep mutans report still Patient is scheduled to finish vancomycin today  My plan is to keep her on vancomycin until we hear back from Dr OAurea Graffabout the sensitivity We need to let home helath know  to give her more vancomcyin starting tomorrow  In the chance they might not be able to get vanc by Sunday, I will also prescribe patient 1 week of linezolid to take if the vancomycin haven't arrived   11/17/2020 Patient started on cefadroxil. Discussed with her being thirsty unlikely related to cefadroxil. We are checking routine labs today though including sodium/kidney function She also asks to check her tsh -- I advise her to f/u with pcp to discuss tsh, but will order    -continue cefadroxil, plan is indefinite -see dr snider in f/u in around 8 weeks -tsh check --> f/u pcp -cbc/cmp/crp today   I have spent a total of 20 minutes of face-to-face and non-face-to-face time, excluding clinical staff time, preparing to see patient, ordering tests and/or medications, and provide counseling the patient   Follow-up: Return for first appointment with dr Baxter Flattery.   I have spent a total of 20 minutes of face-to-face and non-face-to-face time, excluding clinical staff time, preparing to see patient, ordering tests and/or medications, and provide counseling the patient    Jabier Mutton, [facility] [phone]  pager    [phone] cell 11/17/2020, 2:57 PM

## 2020-11-18 LAB — COMPREHENSIVE METABOLIC PANEL
AG Ratio: 1.7 (calc) (ref 1.0–2.5)
ALT: 29 U/L (ref 6–29)
AST: 28 U/L (ref 10–35)
Albumin: 4 g/dL (ref 3.6–5.1)
Alkaline phosphatase (APISO): 71 U/L (ref 37–153)
BUN: 19 mg/dL (ref 7–25)
CO2: 30 mmol/L (ref 20–32)
Calcium: 9.2 mg/dL (ref 8.6–10.4)
Chloride: 97 mmol/L — ABNORMAL LOW (ref 98–110)
Creat: 0.52 mg/dL (ref 0.50–1.05)
Globulin: 2.3 g/dL (calc) (ref 1.9–3.7)
Glucose, Bld: 86 mg/dL (ref 65–99)
Potassium: 4.2 mmol/L (ref 3.5–5.3)
Sodium: 134 mmol/L — ABNORMAL LOW (ref 135–146)
Total Bilirubin: 0.6 mg/dL (ref 0.2–1.2)
Total Protein: 6.3 g/dL (ref 6.1–8.1)

## 2020-11-18 LAB — CBC WITH DIFFERENTIAL/PLATELET
Absolute Monocytes: 490 cells/uL (ref 200–950)
Basophils Absolute: 61 cells/uL (ref 0–200)
Basophils Relative: 1.1 %
Eosinophils Absolute: 132 cells/uL (ref 15–500)
Eosinophils Relative: 2.4 %
HCT: 34.4 % — ABNORMAL LOW (ref 35.0–45.0)
Hemoglobin: 11.2 g/dL — ABNORMAL LOW (ref 11.7–15.5)
Lymphs Abs: 1452 cells/uL (ref 850–3900)
MCH: 28.6 pg (ref 27.0–33.0)
MCHC: 32.6 g/dL (ref 32.0–36.0)
MCV: 87.8 fL (ref 80.0–100.0)
MPV: 9.1 fL (ref 7.5–12.5)
Monocytes Relative: 8.9 %
Neutro Abs: 3366 cells/uL (ref 1500–7800)
Neutrophils Relative %: 61.2 %
Platelets: 293 10*3/uL (ref 140–400)
RBC: 3.92 10*6/uL (ref 3.80–5.10)
RDW: 12.8 % (ref 11.0–15.0)
Total Lymphocyte: 26.4 %
WBC: 5.5 10*3/uL (ref 3.8–10.8)

## 2020-11-18 LAB — C-REACTIVE PROTEIN: CRP: 1.7 mg/L (ref <8.0)

## 2020-11-18 LAB — T4, FREE: Free T4: 1.5 ng/dL (ref 0.8–1.8)

## 2020-11-18 LAB — TSH+FREE T4: TSH W/REFLEX TO FT4: 0.15 mIU/L — ABNORMAL LOW (ref 0.40–4.50)

## 2020-11-19 ENCOUNTER — Encounter: Payer: Self-pay | Admitting: Family Medicine

## 2020-11-20 ENCOUNTER — Telehealth: Payer: Self-pay | Admitting: Gastroenterology

## 2020-11-20 MED ORDER — AMOXICILLIN-POT CLAVULANATE 875-125 MG PO TABS: 1.0000 | ORAL_TABLET | Freq: Two times a day (BID) | ORAL | 0 refills | Status: AC

## 2020-11-20 NOTE — Telephone Encounter (Signed)
Spoke with patient where she started to have diarrhea, lower abdominal pain, and isolated fever. Based on epic notes, she is going to get started with 7 d course of amox/clav, which she has tolerated in the past. I said it is okay for her to take amox/clav with her current tx of pji. If her abdominal pain/diarrhea/abdominal pain worsen, then she should go to ED for evaluation. She did take a dose of miralax yesterday since she felt constipated, though this could cause diarrhea, it should not cause fever. If symptoms persists, it make sense to check for c.difficile.

## 2020-11-20 NOTE — Telephone Encounter (Signed)
Spoke with patient, see 11/20/20 telephone encounter

## 2020-11-20 NOTE — Telephone Encounter (Signed)
Spoke with patient, she reports that she thinks she may be having a diverticulitis flare. Patient states that she had a staph infection in her replacement knee and is currently on Cefadroxil for 6 months. Patient reports that her symptoms started yesterday morning. She states that the lower abdominal pain was "unrelenting". She states that she had 6 bowel movements throughout the night which helped some. She states that every time she went to have a bowel movement her stools became looser. She states that she is still having discomfort and can't walk comfortably. She states that at 12:30 am she had a fever of 101. This morning her temp was 100.4 and she took tylenol. She does have nausea but no antiemetics. Pt states that she still has some Cipro and Flagyl from the last time. She states that she was told that Cipro can cause C. Diff. Please advise, thanks.

## 2020-11-20 NOTE — Telephone Encounter (Signed)
Okay that is a bit confusing. She is also on cefadroxil which is very similar to cefazolin and also a cephalosporin, so not sure. Can you ask her if she has had a PCN allergy? If not probably okay to take if she is tolerating cefadroxil however if she is anxious about this or if she would prefer to go on cipro '500mg'$  BID and flagyl '500mg'$  TID that is the other option, but she has been on it previously. Thanks

## 2020-11-20 NOTE — Telephone Encounter (Signed)
Inbound call from patient. Believes she is having a flare up of diverticulitis. Has 101 fever, diarrhea, LUQ. Also has had staph infection

## 2020-11-20 NOTE — Telephone Encounter (Signed)
Spoke with patient in regards to recommendations. We have sent in Augmentin to her pharmacy, she requested [facility]. Patient had several questions. She thought that the prescription had been sent in at 2:25 pm, advised I am not sure who gave her that information. I did not speak with patient prior to this. Advised that I had several things to discuss with her prior to me sending in the medication. Patient verbalized understanding of all information.

## 2020-11-20 NOTE — Telephone Encounter (Signed)
[place] we can treat her empirically for diverticulitis to see if this gets better, she had an episode back in June as well. She really needs a colonoscopy but has been unable to do it recently. I think she is scheduled to see me in September in the office, if she is otherwise doing okay in regards to her knee and other medical problems may be reasonable to put her on the scheduled for colonoscopy in 6-8 weeks if she is willing.  Otherwise, any antibiotic can put her at risk for C Diff, she will need to monitor for symptoms. She has been on cipro / flagyl recently, given this occurrence why don't we try Augmentin '875mg'$  PO BID for 7 days and see how she does. Please let her know that Augmentin can cause diarruea however in itself. If worsening pain, persistent fever, etc, moving forward however she needs to go to the ED for imaging and further evaluation. If her abdominal pain is that severe regardless she should go to the ED now. Not sure what component of her fever otherwise could be coming from her knee, that has seemed well controlled recently but should ask her ID physicians if she has questions about that. Thanks

## 2020-11-20 NOTE — Telephone Encounter (Signed)
Lm on vm for patient to return call.   Dr. Havery Moros, when ordering Augmentin it is flagged due to allery/contraindication to Cefazolin - reaction is rash. Please advise if OK to proceed with RX. Thanks

## 2020-11-22 ENCOUNTER — Ambulatory Visit: Payer: Medicare Other | Admitting: Gastroenterology

## 2020-11-27 ENCOUNTER — Encounter (HOSPITAL_COMMUNITY): Payer: Self-pay

## 2020-11-27 ENCOUNTER — Emergency Department (HOSPITAL_COMMUNITY)
Admission: EM | Admit: 2020-11-27 | Discharge: 2020-11-27 | Disposition: A | Discharge status: Home / Self Care | Payer: Medicare Other | Attending: Emergency Medicine | Admitting: Emergency Medicine

## 2020-11-27 ENCOUNTER — Other Ambulatory Visit: Payer: Self-pay

## 2020-11-27 ENCOUNTER — Emergency Department (HOSPITAL_COMMUNITY): Payer: Medicare Other

## 2020-11-27 DIAGNOSIS — R140 Abdominal distension (gaseous): Secondary | ICD-10-CM | POA: Insufficient documentation

## 2020-11-27 DIAGNOSIS — T8453XD Infection and inflammatory reaction due to internal right knee prosthesis, subsequent encounter: Secondary | ICD-10-CM | POA: Diagnosis not present

## 2020-11-27 DIAGNOSIS — Z96651 Presence of right artificial knee joint: Secondary | ICD-10-CM | POA: Diagnosis not present

## 2020-11-27 DIAGNOSIS — R509 Fever, unspecified: Secondary | ICD-10-CM | POA: Diagnosis not present

## 2020-11-27 DIAGNOSIS — Z87891 Personal history of nicotine dependence: Secondary | ICD-10-CM | POA: Insufficient documentation

## 2020-11-27 DIAGNOSIS — Z7982 Long term (current) use of aspirin: Secondary | ICD-10-CM | POA: Diagnosis not present

## 2020-11-27 DIAGNOSIS — R197 Diarrhea, unspecified: Secondary | ICD-10-CM | POA: Insufficient documentation

## 2020-11-27 DIAGNOSIS — E039 Hypothyroidism, unspecified: Secondary | ICD-10-CM | POA: Diagnosis not present

## 2020-11-27 DIAGNOSIS — R1011 Right upper quadrant pain: Secondary | ICD-10-CM | POA: Diagnosis not present

## 2020-11-27 DIAGNOSIS — Z20822 Contact with and (suspected) exposure to covid-19: Secondary | ICD-10-CM | POA: Insufficient documentation

## 2020-11-27 DIAGNOSIS — Z79899 Other long term (current) drug therapy: Secondary | ICD-10-CM | POA: Diagnosis not present

## 2020-11-27 DIAGNOSIS — K5730 Diverticulosis of large intestine without perforation or abscess without bleeding: Secondary | ICD-10-CM | POA: Diagnosis not present

## 2020-11-27 DIAGNOSIS — R1030 Lower abdominal pain, unspecified: Secondary | ICD-10-CM | POA: Diagnosis not present

## 2020-11-27 DIAGNOSIS — R110 Nausea: Secondary | ICD-10-CM | POA: Insufficient documentation

## 2020-11-27 DIAGNOSIS — R1031 Right lower quadrant pain: Secondary | ICD-10-CM | POA: Diagnosis not present

## 2020-11-27 LAB — COMPREHENSIVE METABOLIC PANEL
ALT: 14 U/L (ref 0–44)
AST: 18 U/L (ref 15–41)
Albumin: 3.5 g/dL (ref 3.5–5.0)
Alkaline Phosphatase: 67 U/L (ref 38–126)
Anion gap: 10 (ref 5–15)
BUN: <5 mg/dL — ABNORMAL LOW (ref 8–23)
CO2: 28 mmol/L (ref 22–32)
Calcium: 9.4 mg/dL (ref 8.9–10.3)
Chloride: 97 mmol/L — ABNORMAL LOW (ref 98–111)
Creatinine, Ser: 0.62 mg/dL (ref 0.44–1.00)
GFR, Estimated: >60 mL/min (ref >60)
Glucose, Bld: 112 mg/dL — ABNORMAL HIGH (ref 70–99)
Potassium: 3.7 mmol/L (ref 3.5–5.1)
Sodium: 135 mmol/L (ref 135–145)
Total Bilirubin: 1.2 mg/dL (ref 0.3–1.2)
Total Protein: 7.1 g/dL (ref 6.5–8.1)

## 2020-11-27 LAB — CBC WITH DIFFERENTIAL/PLATELET
Abs Immature Granulocytes: 0.06 10*3/uL (ref 0.00–0.07)
Basophils Absolute: 0 10*3/uL (ref 0.0–0.1)
Basophils Relative: 0 %
Eosinophils Absolute: 0.1 10*3/uL (ref 0.0–0.5)
Eosinophils Relative: 1 %
HCT: 37.6 % (ref 36.0–46.0)
Hemoglobin: 12.3 g/dL (ref 12.0–15.0)
Immature Granulocytes: 1 %
Lymphocytes Relative: 12 %
Lymphs Abs: 1.3 10*3/uL (ref 0.7–4.0)
MCH: 28.8 pg (ref 26.0–34.0)
MCHC: 32.7 g/dL (ref 30.0–36.0)
MCV: 88.1 fL (ref 80.0–100.0)
Monocytes Absolute: 1 10*3/uL (ref 0.1–1.0)
Monocytes Relative: 9 %
Neutro Abs: 8.5 10*3/uL — ABNORMAL HIGH (ref 1.7–7.7)
Neutrophils Relative %: 77 %
Platelets: 349 10*3/uL (ref 150–400)
RBC: 4.27 MIL/uL (ref 3.87–5.11)
RDW: 12.7 % (ref 11.5–15.5)
WBC: 10.9 10*3/uL — ABNORMAL HIGH (ref 4.0–10.5)
nRBC: 0 % (ref 0.0–0.2)

## 2020-11-27 LAB — LACTIC ACID, PLASMA: Lactic Acid, Venous: 1 mmol/L (ref 0.5–1.9)

## 2020-11-27 LAB — URINALYSIS, ROUTINE W REFLEX MICROSCOPIC
Bilirubin Urine: NEGATIVE
Glucose, UA: NEGATIVE mg/dL
Hgb urine dipstick: NEGATIVE
Ketones, ur: NEGATIVE mg/dL
Leukocytes,Ua: NEGATIVE
Nitrite: NEGATIVE
Protein, ur: NEGATIVE mg/dL
Specific Gravity, Urine: 1.009 (ref 1.005–1.030)
pH: 7 (ref 5.0–8.0)

## 2020-11-27 LAB — LIPASE, BLOOD: Lipase: 26 U/L (ref 11–51)

## 2020-11-27 MED ORDER — SODIUM CHLORIDE 0.9 % IV SOLN: INTRAVENOUS | Status: DC

## 2020-11-27 MED ORDER — IOHEXOL 350 MG/ML SOLN
100.0000 mL | Freq: Once | INTRAVENOUS | Status: AC | PRN
  Administered 2020-11-27: 100 mL via INTRAVENOUS

## 2020-11-27 NOTE — ED Provider Notes (Signed)
[facility] EMERGENCY DEPARTMENT Provider Note   CSN: [id number] Arrival date & time: 11/27/20  1242     History No chief complaint on file.   Crystal Russell is a 70 y.o. female.  She is also on cefadroxil for septic right knee prosthesis.  When the symptoms began, she messaged her gastroenterologist.  They empirically prescribed Augmentin.  She states that she was also following a liquid diet.  3 days ago, she began to feel slightly better, and she increased her diet slightly to include foods from the brat diet.  She had a little bit of pain after eating some cottage cheese and pretzels.  When she attempted to walk 1 mile, she has severe pain and can barely finish her walk.  She has severe pain yesterday and fever in the middle of the night.  The history is provided by the patient.  Abdominal Pain Pain location:  RUQ and RLQ Pain quality: sharp   Pain quality comment:  Like gas pains Pain radiates to:  LUQ and RUQ Pain severity now: has ranged from mild to severe; currently mild now that she is supine. Onset quality:  Gradual Duration:  8 days Timing:  Constant Progression:  Worsening Chronicity:  New Context comment:  Being treated for possible diverticulitis; has not had imaging Relieved by:  Nothing Worsened by:  Eating Ineffective treatments: Augmentin. Associated symptoms: diarrhea, fever (Tmax 101 last night) and nausea   Associated symptoms: no chest pain, no chills, no constipation, no cough, no dysuria, no hematuria, no shortness of breath, no sore throat and no vomiting       Past Medical History:  Diagnosis Date   Abdominal wall hernia 08/04/2012   Atrial fibrillation ([place]) 2008   REPORTS SHE WAS AT [place], UPON WAKING I FELT MY HEART GOING BOOM BOOM BOOM BEATING OUT OF MY CHEST. I WENT TO HOSPITAL AND THEY ISED MEDICINE TO CONVERT ME , IT TOOK OVER 10 HOURS TO CONVERT ME. BUT AFTER THAT IVE NEVER HAD ANY ISSUES SINCE. I USED TO HAVE A CARDIOLOGUIST  AS MY PRIMARY IN [place] BUT IHAVENT BEEN TO A CARDIOLOGIST SINCE I MOVED DOWN HERE.    Chicken pox as a child   Depression    Depression with anxiety 12/27/2012   Diverticulitis    DJD (degenerative joint disease) 08/05/2012   DVT (deep venous thrombosis) ([place]) 2018   Fifth disease 1989   DX AFTER EXPOSURE FROM HER CHILDREN; DEVELOPED RHEUMATOID ARTHRITIS DURING COURSE OF ILLNESS. REPORTS:  " I DONT HAVE IT ANYMORE SINCE THE DISEASE IS GONE"    H/O gestational diabetes mellitus, not currently pregnant 08/05/2012   History of    Heart murmur    echo 2012; "I HAVE A FUNCTIONAL MURMUR"    Hyperlipidemia    Hypothyroidism    Knee effusion, right    while on Eliquis, discontinued Eliquis after DVT resolved   Measles as a child   Migraines    Other and unspecified hyperlipidemia 08/05/2012   Paroxysmal A-fib ([place])    Shingles 43   Spondylisthesis 2012   L5/S1 grade 3   Varicose veins 08/05/2012   Extending up to right groin B/l LE   Ventral hernia 08/05/2012   Wears glasses     Patient Active Problem List   Diagnosis Date Noted   Infection of prosthetic right knee joint ([place]) 09/14/2020   PAC (premature atrial contraction) 07/28/2020   PAF (paroxysmal atrial fibrillation) ([place]) 05/01/2020   Mild aortic valve regurgitation  05/01/2020   Mild mitral regurgitation 05/01/2020   PFO (patent foramen ovale) 05/01/2020   Laceration of skin of right hand 03/19/2020   Diverticulitis 03/16/2020   Hyperglycemia 03/16/2020   Vertigo 09/14/2019   Status post total right knee replacement 07/02/2017   S/P TKR (total knee replacement) 07/01/2017   Arthritis, septic, knee ([place]) 06/16/2017   Postmenopausal bleeding 12/06/2015   Varicose veins of bilateral lower extremities with other complications XX123456   Sun-damaged skin 12/04/2014   Breast mass, left 05/16/2014   Varicose veins of lower extremities with other complications [id number]   Preventative health care 08/08/2013   Hypothyroidism  08/08/2013   Atrophic vaginitis 08/08/2013   Cervical cancer screening 08/05/2013   ETD (eustachian tube dysfunction) 05/26/2013   Depression with anxiety 12/27/2012   Ventral hernia 08/05/2012   Varicose veins 08/05/2012   DJD (degenerative joint disease) 08/05/2012   Constipation 08/05/2012   Hyperlipidemia, mixed 08/05/2012   H/O gestational diabetes mellitus, not currently pregnant 08/05/2012   Chicken pox    Shingles    Paroxysmal A-fib ([place])    Heart murmur    Spondylisthesis     Past Surgical History:  Procedure Laterality Date   BREAST DUCTAL SYSTEM EXCISION Right 05/05/2014   Procedure: RIGHT BREAST DUCT EXCISION;  Surgeon: Rolm Bookbinder, MD;  Location: [facility];  Service: General;  Laterality: Right;   COLONOSCOPY     DILATION AND CURETTAGE OF UTERUS     x4   EXCISIONAL TOTAL KNEE ARTHROPLASTY WITH ANTIBIOTIC SPACERS Right 09/14/2020   Procedure: Irrigation and debriement with poly exchange versus resection of right total knee arthroplasty and placement of antibitiotic spacer;  Surgeon: Paralee Cancel, MD;  Location: [facility];  Service: Orthopedics;  Laterality: Right;   MOUTH SURGERY     TOTAL KNEE ARTHROPLASTY Right 07/01/2017   Procedure: RIGHT TOTAL KNEE ARTHROPLASTY;  Surgeon: Paralee Cancel, MD;  Location: [facility];  Service: Orthopedics;  Laterality: Right;  62 mins   [place]   right leg     OB History     Gravida  3   Para  3   Term      Preterm      AB      Living  3      SAB      IAB      Ectopic      Multiple      Live Births              Family History  Problem Relation Age of Onset   Cancer Mother        lung- smoker   Liver disease Mother    Cancer Father        lung- smoker   Peripheral Artery Disease Sister    Dementia Maternal Grandmother    Heart attack Maternal Grandfather    Asthma Son    Ulcerative colitis Son    Asthma Son    Ulcerative colitis Son    Heart attack Paternal  Uncle     Social History   Tobacco Use   Smoking status: Former    Years: 4.00    Types: Cigarettes   Smokeless tobacco: Never  Vaping Use   Vaping Use: Never used  Substance Use Topics   Alcohol use: No   Drug use: No    Home Medications Prior to Admission medications   Medication Sig Start Date End Date Taking? Authorizing Provider  acetaminophen (TYLENOL) 500 MG  tablet Take 1,000 mg by mouth in the morning and at bedtime.    [provider]  amoxicillin-clavulanate (AUGMENTIN) 875-125 MG tablet Take 1 tablet by mouth 2 (two) times daily for 7 days. 11/20/20 11/27/20  Yetta Flock, MD  aspirin EC 81 MG tablet Take 81 mg by mouth daily. Swallow whole.    [provider]  atorvastatin (LIPITOR) 10 MG tablet Take 0.5 tablets (5 mg total) by mouth daily. 09/12/20   Werner Lean, MD  cefadroxil (DURICEF) 500 MG capsule Take 2 capsules (1,000 mg total) by mouth 2 (two) times daily. 11/06/20 12/06/20  Vu, Johnny Bridge T, MD  Cholecalciferol (PA VITAMIN D-3) 50 MCG (2000 UT) CAPS Take 4,000 Units by mouth every evening.    [provider]  Coenzyme Q10 300 MG CAPS Take 300 mg by mouth every evening.    [provider]  hydrOXYzine (ATARAX/VISTARIL) 10 MG tablet Take 1 tablet (10 mg total) by mouth 3 (three) times daily as needed. 10/16/20   Carlyle Basques, MD  levothyroxine (SYNTHROID) 112 MCG tablet Take 1 tablet (112 mcg total) by mouth daily before breakfast. 10/06/20   Mosie Lukes, MD  nystatin (MYCOSTATIN) 100000 UNIT/ML suspension take 1 teaspoonsful (12ms) by mouth 4 times a day 10/18/20   BMosie Lukes MD  rifampin (RIFADIN) 150 MG capsule rifampin Patient not taking: Reported on 11/03/2020    [provider]  triamcinolone (KENALOG) 0.025 % cream Apply 1 application topically 2 (two) times daily. To affected areas 10/16/20   SCarlyle Basques MD  vitamin B-12 (CYANOCOBALAMIN) 1000 MCG tablet Take 1,000 mcg by mouth every evening.     [provider]    Allergies    Crestor [rosuvastatin], Pravachol [pravastatin], and Cefazolin  Review of Systems   Review of Systems  Constitutional:  Positive for fever (Tmax 101 last night). Negative for chills.  HENT:  Negative for ear pain and sore throat.   Eyes:  Negative for pain and visual disturbance.  Respiratory:  Negative for cough and shortness of breath.   Cardiovascular:  Negative for chest pain and palpitations.  Gastrointestinal:  Positive for abdominal pain, diarrhea and nausea. Negative for constipation and vomiting.  Genitourinary:  Negative for dysuria and hematuria.  Musculoskeletal:  Negative for arthralgias and back pain.  Skin:  Negative for color change and rash.  Neurological:  Negative for seizures and syncope.  All other systems reviewed and are negative.  Physical Exam Updated Vital Signs BP 132/79 (BP Location: Left Arm)   Pulse 64   Temp 99.6 F (37.6 C)   Resp 18   SpO2 100%   Physical Exam Vitals and nursing note reviewed.  HENT:     Head: Normocephalic and atraumatic.  Eyes:     General: No scleral icterus. Pulmonary:     Effort: Pulmonary effort is normal. No respiratory distress.  Abdominal:     General: There is distension.     Tenderness: There is abdominal tenderness. There is no guarding or rebound.     Comments: Mild, diffuse tenderness  Musculoskeletal:        General: No deformity or signs of injury.     Cervical back: Normal range of motion.  Skin:    General: Skin is warm and dry.  Neurological:     General: No focal deficit present.     Mental Status: She is alert and oriented to person, place, and time.  Psychiatric:        Mood  and Affect: Mood normal.    ED Results / Procedures / Treatments   Labs (all labs ordered are listed, but only abnormal results are displayed) Labs Reviewed  CBC WITH DIFFERENTIAL/PLATELET - Abnormal; Notable for the following components:      Result Value   WBC 10.9 (*)     Neutro Abs 8.5 (*)    All other components within normal limits  COMPREHENSIVE METABOLIC PANEL - Abnormal; Notable for the following components:   Chloride 97 (*)    Glucose, Bld 112 (*)    BUN <5 (*)    All other components within normal limits  SARS CORONAVIRUS 2 (TAT 6-24 HRS)  LIPASE, BLOOD  LACTIC ACID, PLASMA  URINALYSIS, ROUTINE W REFLEX MICROSCOPIC    EKG None  Radiology CT ABDOMEN PELVIS W CONTRAST  Result Date: 11/27/2020 CLINICAL DATA:  Diverticulitis, complication suspected. EXAM: CT ABDOMEN AND PELVIS WITH CONTRAST TECHNIQUE: Multidetector CT imaging of the abdomen and pelvis was performed using the standard protocol following bolus administration of intravenous contrast. CONTRAST:  148m OMNIPAQUE IOHEXOL 350 MG/ML SOLN COMPARISON:  09/24/2019 FINDINGS: Lower chest: Lung bases are clear. No effusions. Heart is normal size. Hepatobiliary: No focal hepatic abnormality. Gallbladder unremarkable. Pancreas: No focal abnormality or ductal dilatation. Spleen: No focal abnormality.  Normal size. Adrenals/Urinary Tract: No adrenal abnormality. No focal renal abnormality. No stones or hydronephrosis. Urinary bladder is unremarkable. Stomach/Bowel: Sigmoid diverticulosis. No surrounding inflammation to suggest active diverticulitis. No bowel obstruction. Stomach and small bowel decompressed, grossly unremarkable. Vascular/Lymphatic: No evidence of aneurysm or adenopathy. Reproductive: Prior hysterectomy.  No adnexal masses. Other: No free fluid or free air. Musculoskeletal: No acute bony abnormality. IMPRESSION: Sigmoid diverticulosis.  No changes of active diverticulitis. Electronically Signed   By: KRolm BaptiseM.D.   On: 11/27/2020 21:34    Procedures Procedures   Medications Ordered in ED Medications  0.9 %  sodium chloride infusion ( Intravenous New Bag/Given 11/27/20 2044)  iohexol (OMNIPAQUE) 350 MG/ML injection 100 mL (100 mLs Intravenous Contrast Given 11/27/20 2127)    ED  Course  I have reviewed the triage vital signs and the nursing notes.  Pertinent labs & imaging results that were available during my care of the patient were reviewed by me and considered in my medical decision making (see chart for details).    MDM Rules/Calculators/A&P                           SMila Homerpresented with over 1 week of lower abdominal pain.  She had been treated with antibiotics for diverticulitis.  She is also experienced a recent right total knee arthroplasty infection and is on antibiotics for that.  I was concerned about possible abscess or perforation.  However, her ED work-up was overall very reassuring.  CT scan was normal with respect to acute pathology.  Labs were reassuring with the exception of the very mild elevation in her white blood cell count.  UA unremarkable. She was advised to follow-up with both infectious disease and gastroenterology for further instructions.  Especially if she has another fever, this will be concerning for an alternative source of infection, specifically her knee.  She was comfortable at discharge and agrees to the follow-up plans.  Return precautions were also extensively discussed. Final Clinical Impression(s) / ED Diagnoses Final diagnoses:  Lower abdominal pain  Infection associated with internal right knee prosthesis, subsequent encounter    Rx / DC Orders ED Discharge Orders  None        Arnaldo Natal, MD 11/27/20 2259

## 2020-11-27 NOTE — Telephone Encounter (Signed)
See alternate patient message. 

## 2020-11-27 NOTE — ED Provider Notes (Signed)
Emergency Medicine Provider Triage Evaluation Note  Crystal Russell , a 70 y.o. female  was evaluated in triage.  Pt complains of concern for worsening diverticulitis.  She has been taking Augmentin, states that she was doing well and then yesterday she ate, has had increased pain, abdominal distention.  She reports she had fevers.  She originally had fevers on this started, they went away with the Augmentin however now back with pain across bilateral lower abdomen.  No dysuria.  She has been in contact with staff from Dr. Doyne Keel office today.  Review of Systems  Positive: Fevers, abdominal pain Negative: Shortness of breath  Physical Exam  BP 124/90 (BP Location: Left Arm)   Pulse 79   Temp 99.6 F (37.6 C)   Resp 17   SpO2 100%  Gen:   Awake, no distress   Resp:  Normal effort  MSK:   Moves extremities without difficulty  Other:  Abdomen with mild bloating   Medical Decision Making  Medically screening exam initiated at 1:26 PM.  Appropriate orders placed.  Crystal Russell was informed that the remainder of the evaluation will be completed by another provider, this initial triage assessment does not replace that evaluation, and the importance of remaining in the ED until their evaluation is complete.  Note: Portions of this report may have been transcribed using voice recognition software. Every effort was made to ensure accuracy; however, inadvertent computerized transcription errors may be present    Lorin Glass, PA-C 11/27/20 1328    Tegeler, Gwenyth Allegra, MD 11/27/20 [phone]

## 2020-11-27 NOTE — ED Triage Notes (Signed)
Patient currently taking augmentin for diverticulitis. States that she ate on Sunday and now having increased pain and abd. Distention. Loose stools with same.

## 2020-11-27 NOTE — Telephone Encounter (Signed)
Please see additional patient message "I am waiting to be triaged, it is packed here. Can you order  ct scan over in [facility]? So I don't have to spend hours here?"

## 2020-11-28 DIAGNOSIS — Z792 Long term (current) use of antibiotics: Secondary | ICD-10-CM

## 2020-11-28 DIAGNOSIS — R197 Diarrhea, unspecified: Secondary | ICD-10-CM

## 2020-11-28 DIAGNOSIS — R195 Other fecal abnormalities: Secondary | ICD-10-CM

## 2020-11-28 LAB — SARS CORONAVIRUS 2 (TAT 6-24 HRS): SARS Coronavirus 2: NEGATIVE

## 2020-11-28 MED ORDER — DICYCLOMINE HCL 10 MG PO CAPS: 10.0000 mg | ORAL_CAPSULE | Freq: Three times a day (TID) | ORAL | 1 refills | Status: DC | PRN

## 2020-11-28 NOTE — Telephone Encounter (Signed)
RX for Bentyl sent to pharmacy on file.  Lab order in epic.

## 2020-11-30 ENCOUNTER — Other Ambulatory Visit: Payer: Medicare Other

## 2020-11-30 DIAGNOSIS — R195 Other fecal abnormalities: Secondary | ICD-10-CM | POA: Diagnosis not present

## 2020-11-30 DIAGNOSIS — Z792 Long term (current) use of antibiotics: Secondary | ICD-10-CM | POA: Diagnosis not present

## 2020-11-30 DIAGNOSIS — R197 Diarrhea, unspecified: Secondary | ICD-10-CM

## 2020-12-01 NOTE — Telephone Encounter (Signed)
I don't have susceptibility data from Dr. Aurea Graff office, so unsure if I can provide an alternative just yet. I did loop in Dr. Gale Journey who saw her last. Sounds like chronic cefadroxil may not be an option for her.Marland KitchenMarland Kitchen

## 2020-12-01 NOTE — Progress Notes (Signed)
Patient previous cx was strep mutans, not staph  It is sensitive to pcn and cefazolin

## 2020-12-01 NOTE — Telephone Encounter (Signed)
She is unable to tolerate cefadroxil. Had to take an antibiotic holiday and her stomach pain resolved.

## 2020-12-02 LAB — CLOSTRIDIUM DIFFICILE BY PCR: Toxigenic C. Difficile by PCR: POSITIVE — AB

## 2020-12-04 NOTE — Telephone Encounter (Signed)
Yes, I see it now. Sorry for the confusion! I'll take care of it.

## 2020-12-05 ENCOUNTER — Encounter: Payer: Self-pay | Admitting: Internal Medicine

## 2020-12-05 ENCOUNTER — Ambulatory Visit (INDEPENDENT_AMBULATORY_CARE_PROVIDER_SITE_OTHER): Payer: Medicare Other | Admitting: Internal Medicine

## 2020-12-05 ENCOUNTER — Other Ambulatory Visit: Payer: Self-pay

## 2020-12-05 VITALS — BP 127/77 | HR 65 | Temp 98.0°F

## 2020-12-05 DIAGNOSIS — A09 Infectious gastroenteritis and colitis, unspecified: Secondary | ICD-10-CM | POA: Diagnosis not present

## 2020-12-05 DIAGNOSIS — M009 Pyogenic arthritis, unspecified: Secondary | ICD-10-CM

## 2020-12-05 DIAGNOSIS — Z221 Carrier of other intestinal infectious diseases: Secondary | ICD-10-CM

## 2020-12-05 DIAGNOSIS — Z789 Other specified health status: Secondary | ICD-10-CM

## 2020-12-05 MED ORDER — DOXYCYCLINE HYCLATE 100 MG PO TABS: 100.0000 mg | ORAL_TABLET | Freq: Two times a day (BID) | ORAL | 3 refills | Status: DC

## 2020-12-05 MED ORDER — VANCOMYCIN 50 MG/ML ORAL SOLUTION: 125.0000 mg | Freq: Four times a day (QID) | ORAL | 3 refills | Status: DC

## 2020-12-05 MED ORDER — ONDANSETRON 4 MG PO TBDP: 4.0000 mg | ORAL_TABLET | Freq: Three times a day (TID) | ORAL | 3 refills | Status: DC | PRN

## 2020-12-05 NOTE — Progress Notes (Signed)
Patient ID: Crystal Russell, female   DOB: [birth year], 70 y.o.   MRN: [id number]  HPI ciana rzepecki is a 70yo F with hx of diverticulitis, and found to have left knee pji s/p I&D polyexchange 6/10; cx staph mutan at dr. Aurea Graff office. She was discharged on cefazolin/rifampin plan until 7/22 then transition to 3-6 months oral antibiotics Patient developed a rash thought to be abx related, both cefazolin/rifampin stopped on 7/11 and transitioned to vancomycin plan for 14 days prior to transitioning to oral abx. She was transitioned to cefpodoxril but started to have nausea, abdominal cramping and diarrhea which was consistent with her history of diverticulitis.  She was treated for 7 day course of amox/clav as of 8/15 for diverticulitis. 8/22 CT scan didn't show any diverticulitis.  On 8/25, I instructed her to temporarily stop abtx, to see if any symptoms improved. We also tested her for cdifficile which was positive. For the past 5 days off of antibiotics , and all her symptoms improved. No longer having diarrhea, nor gi upset, and appetite improved  Prior to stopping abtx - Would have 3-4 watery stools in the morning, some mucosy stools - had 2 weeks of watery stools, was treated for diverticulitis.    Since stopping abtx, no longer having abdominal pain. Nor having any diarrhea. ( In normal times, she would need to take miralax to have BM)   Staphylococcus mutans - per dr olin/ office to send micro data Outpatient Encounter Medications as of 12/05/2020  Medication Sig   acetaminophen (TYLENOL) 500 MG tablet Take 1,000 mg by mouth in the morning and at bedtime.   aspirin EC 81 MG tablet Take 81 mg by mouth daily. Swallow whole.   atorvastatin (LIPITOR) 10 MG tablet Take 0.5 tablets (5 mg total) by mouth daily.   Cholecalciferol (PA VITAMIN D-3) 50 MCG (2000 UT) CAPS Take 4,000 Units by mouth every evening.   Coenzyme Q10 300 MG CAPS Take 300 mg by mouth every evening.   levothyroxine  (SYNTHROID) 112 MCG tablet Take 1 tablet (112 mcg total) by mouth daily before breakfast. (Patient taking differently: Take 110 mcg by mouth daily before breakfast.)   cefadroxil (DURICEF) 500 MG capsule Take 2 capsules (1,000 mg total) by mouth 2 (two) times daily. (Patient not taking: Reported on 12/05/2020)   dicyclomine (BENTYL) 10 MG capsule Take 1 capsule (10 mg total) by mouth every 8 (eight) hours as needed. (Patient not taking: Reported on 12/05/2020)   hydrOXYzine (ATARAX/VISTARIL) 10 MG tablet Take 1 tablet (10 mg total) by mouth 3 (three) times daily as needed.   nystatin (MYCOSTATIN) 100000 UNIT/ML suspension take 1 teaspoonsful (29ms) by mouth 4 times a day (Patient not taking: No sig reported)   rifampin (RIFADIN) 150 MG capsule rifampin (Patient not taking: No sig reported)   triamcinolone (KENALOG) 0.025 % cream Apply 1 application topically 2 (two) times daily. To affected areas   vitamin B-12 (CYANOCOBALAMIN) 1000 MCG tablet Take 1,000 mcg by mouth every evening. (Patient not taking: Reported on 12/05/2020)   No facility-administered encounter medications on file as of 12/05/2020.     Patient Active Problem List   Diagnosis Date Noted   [place] 09/14/2020   PAC (premature atrial contraction) 07/28/2020   [place]ox[place] 05/01[place]2020   Mild aortic valve regurgitation 05/01/2020   Mild mitral regurgitation 05/01/2020   PFO (patent foramen ovale) 05/01/2020   Laceration of skin of right hand 03/19/2020  Diverticulitis 03/16/2020   Hyperglycemia 03/16/2020   Vertigo 09/14/2019   Status post total right knee replacement 07/02/2017   S/P TKR (total knee replacement) 07/01/2017   Arthritis, septic, knee ([place]) 06/16/2017   Postmenopausal bleeding 12/06/2015   Varicose veins of bilateral lower extremities with other complications XX123456   Sun-damaged skin 12/04/2014   Breast mass, left 05/16/2014   Varicose  veins of lower extremities with other complications [id number]   Preventative health care 08/08/2013   Hypothyroidism 08/08/2013   Atrophic vaginitis 08/08/2013   Cervical cancer screening 08/05/2013   ETD (eustachian tube dysfunction) 05/26/2013   Depression with anxiety 12/27/2012   Ventral hernia 08/05/2012   Varicose veins 08/05/2012   DJD (degenerative joint disease) 08/05/2012   Constipation 08/05/2012   Hyperlipidemia, mixed 08/05/2012   H/O gestational diabetes mellitus, not currently pregnant 08/05/2012   Chicken pox    Shingles    Paroxysmal A-fib ([place])    Heart murmur    Spondylisthesis      Health Maintenance Due  Topic Date Due   Hepatitis C Screening  Never done   Zoster Vaccines- Shingrix (1 of 2) Never done   COLONOSCOPY (Pts 45-56yr Insurance coverage will need to be confirmed)  07/15/2018   COVID-19 Vaccine (4 - Booster for Pfizer series) 05/26/2020   INFLUENZA VACCINE  11/06/2020     Review of Systems +fatigue, + knee pain Physical Exam   BP 127/77   Pulse 65   Temp 98 F (36.7 C) (Oral)   SpO2 100%   Physical Exam  Constitutional:  oriented to person, place, and time. appears well-developed and well-nourished. No distress.  HENT: [place]/AT, PERRLA, no scleral icterus Mouth/Throat: Oropharynx is clear and moist. No oropharyngeal exudate.  Cardiovascular: Normal rate, regular rhythm and normal heart sounds. Exam reveals no gallop and no friction rub.  No murmur heard.  Pulmonary/Chest: Effort normal and breath sounds normal. No respiratory distress.  has no wheezes.  Neck = supple, no nuchal rigidity Abdominal: Soft. Bowel sounds are normal.  exhibits no distension. There is no tenderness.  Ext:= slight warmth/swelling to left knee Lymphadenopathy: no cervical adenopathy. No axillary adenopathy Neurological: alert and oriented to person, place, and time.  Skin: Skin is warm and dry. No rash noted. No erythema.  Psychiatric: a normal mood and affect.   behavior is normal.    CBC Lab Results  Component Value Date   WBC 10.9 (H) 11/27/2020   RBC 4.27 11/27/2020   HGB 12.3 11/27/2020   HCT 37.6 11/27/2020   PLT 349 11/27/2020   MCV 88.1 11/27/2020   MCH 28.8 11/27/2020   MCHC 32.7 11/27/2020   RDW 12.7 11/27/2020   LYMPHSABS 1.3 11/27/2020   MONOABS 1.0 11/27/2020   EOSABS 0.1 11/27/2020    BMET Lab Results  Component Value Date   NA 135 11/27/2020   K 3.7 11/27/2020   CL 97 (L) 11/27/2020   CO2 28 11/27/2020   GLUCOSE 112 (H) 11/27/2020   BUN <5 (L) 11/27/2020   CREATININE 0.62 11/27/2020   CALCIUM 9.4 11/27/2020   GFRNONAA >60 11/27/2020   GFRAA >60 09/19/2019      Assessment and Plan  Cdiff diarrhea = appears colonized with toxigenic c.difficile. she was having diarrhea but now resolved. We will plan to give Oral vancomycin to either do as proph vs treatment doses. I am in favor to start if if she has diarrhea recurrence  Staph mutans  pji= will try doxycycline '100mg'$  bid, will also give with  anti-nausea medication. Will avoid cephalexin due to cefazolin rash. She did not appear to tolerate cefadoxril.  Phone visit next visit to see how she is tolerating doxycycline and if having any diarrhea

## 2020-12-05 NOTE — Patient Instructions (Signed)
   Text = [phone]. Uptodate on friday

## 2020-12-08 ENCOUNTER — Other Ambulatory Visit (HOSPITAL_COMMUNITY): Payer: Self-pay

## 2020-12-08 ENCOUNTER — Other Ambulatory Visit: Payer: Self-pay | Admitting: Infectious Diseases

## 2020-12-08 ENCOUNTER — Telehealth: Payer: Self-pay

## 2020-12-08 MED ORDER — VANCOMYCIN HCL 125 MG PO CAPS: 125.0000 mg | ORAL_CAPSULE | Freq: Four times a day (QID) | ORAL | 1 refills | Status: AC

## 2020-12-08 NOTE — Telephone Encounter (Signed)
[facility] Patient Advocate Encounter  Completed and sent TAF application for Vacomycin for this patient who is insured.    Patient is approved 12/08/20 through 01/07/21.  BIN      [id number] PCN    AS GRP    [id number] ID        ID:[id number]   Ileene Patrick, [place] [facility] Phone: [phone] Fax:  [phone]

## 2020-12-14 ENCOUNTER — Ambulatory Visit (INDEPENDENT_AMBULATORY_CARE_PROVIDER_SITE_OTHER): Payer: Medicare Other | Admitting: Internal Medicine

## 2020-12-14 ENCOUNTER — Other Ambulatory Visit: Payer: Self-pay

## 2020-12-14 DIAGNOSIS — Z221 Carrier of other intestinal infectious diseases: Secondary | ICD-10-CM

## 2020-12-14 DIAGNOSIS — M009 Pyogenic arthritis, unspecified: Secondary | ICD-10-CM | POA: Diagnosis not present

## 2020-12-14 DIAGNOSIS — K5900 Constipation, unspecified: Secondary | ICD-10-CM

## 2020-12-14 NOTE — Progress Notes (Signed)
Virtual Visit via Video Note  I connected with Crystal Russell on 12/14/20 at  1:45 PM EDT by a video enabled telemedicine application and verified that I am speaking with the correct person using two identifiers.  Location: Patient: at home Provider: in clinic   I discussed the limitations of evaluation and management by telemedicine and the availability of in person appointments. The patient expressed understanding and agreed to proceed.  History of Present Illness:  Worsening pain in her right knee, after doing 10,000 steps. She saw dr Alvan Dame who recommended doing twice a week physical therapy. If it continues to worsen, may need to do a 2 stage revision.  She is not having any diarrhea. She started to take miralax since she is constipated. She has not started taking oral vancomycin per my request.   ROS: no longer having any body odor since on abtx   Observations/Objective: She is afebrile.   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 If she starts having watery stools (4-5x per day) suspect that is due to cdiffficile, and will have her start taking oral vanco QID  IBS with constipation = currently taking miralax to help.   Follow Up Instructions: will follow up in 3-4 wk to see that she continuing on the mend. If her knee pain continues to worsen, it sounds like she is weighing the decision for 2 staged revision with dr Alvan Dame    I discussed the assessment and treatment plan with the patient. The patient was provided an opportunity to ask questions and all were answered. The patient agreed with the plan and demonstrated an understanding of the instructions.   The patient was advised to call back or seek an in-person evaluation if the symptoms worsen or if the condition fails to improve as anticipated.  I provided 10 minutes of  non-face-to-face time during this encounter.   Carlyle Basques, MD

## 2020-12-18 DIAGNOSIS — M25661 Stiffness of right knee, not elsewhere classified: Secondary | ICD-10-CM | POA: Diagnosis not present

## 2020-12-26 ENCOUNTER — Other Ambulatory Visit: Payer: Self-pay | Admitting: Family Medicine

## 2020-12-26 ENCOUNTER — Encounter: Payer: Self-pay | Admitting: Family Medicine

## 2020-12-26 DIAGNOSIS — E039 Hypothyroidism, unspecified: Secondary | ICD-10-CM

## 2020-12-26 MED ORDER — LEVOTHYROXINE SODIUM 112 MCG PO TABS
112.0000 ug | ORAL_TABLET | Freq: Every day | ORAL | 1 refills | Status: DC
Start: 2020-12-26 — End: 2021-01-05

## 2020-12-28 DIAGNOSIS — B079 Viral wart, unspecified: Secondary | ICD-10-CM | POA: Diagnosis not present

## 2020-12-28 DIAGNOSIS — D1801 Hemangioma of skin and subcutaneous tissue: Secondary | ICD-10-CM | POA: Diagnosis not present

## 2020-12-28 DIAGNOSIS — L578 Other skin changes due to chronic exposure to nonionizing radiation: Secondary | ICD-10-CM | POA: Diagnosis not present

## 2020-12-28 DIAGNOSIS — L570 Actinic keratosis: Secondary | ICD-10-CM | POA: Diagnosis not present

## 2020-12-28 DIAGNOSIS — L814 Other melanin hyperpigmentation: Secondary | ICD-10-CM | POA: Diagnosis not present

## 2020-12-28 DIAGNOSIS — D225 Melanocytic nevi of trunk: Secondary | ICD-10-CM | POA: Diagnosis not present

## 2020-12-28 DIAGNOSIS — R202 Paresthesia of skin: Secondary | ICD-10-CM | POA: Diagnosis not present

## 2020-12-28 DIAGNOSIS — L821 Other seborrheic keratosis: Secondary | ICD-10-CM | POA: Diagnosis not present

## 2020-12-29 ENCOUNTER — Other Ambulatory Visit: Payer: Self-pay | Admitting: Family Medicine

## 2021-01-03 ENCOUNTER — Other Ambulatory Visit: Payer: Self-pay | Admitting: Family Medicine

## 2021-01-03 DIAGNOSIS — M25661 Stiffness of right knee, not elsewhere classified: Secondary | ICD-10-CM | POA: Diagnosis not present

## 2021-01-05 ENCOUNTER — Ambulatory Visit (INDEPENDENT_AMBULATORY_CARE_PROVIDER_SITE_OTHER): Payer: Medicare Other | Admitting: Gastroenterology

## 2021-01-05 ENCOUNTER — Encounter: Payer: Self-pay | Admitting: Gastroenterology

## 2021-01-05 VITALS — BP 120/70 | HR 66 | Ht 64.0 in | Wt 130.0 lb

## 2021-01-05 DIAGNOSIS — Z221 Carrier of other intestinal infectious diseases: Secondary | ICD-10-CM | POA: Diagnosis not present

## 2021-01-05 DIAGNOSIS — Z8719 Personal history of other diseases of the digestive system: Secondary | ICD-10-CM

## 2021-01-05 DIAGNOSIS — Z23 Encounter for immunization: Secondary | ICD-10-CM | POA: Diagnosis not present

## 2021-01-05 DIAGNOSIS — K5900 Constipation, unspecified: Secondary | ICD-10-CM

## 2021-01-05 MED ORDER — SUTAB 1479-225-188 MG PO TABS: 1.0000 | ORAL_TABLET | Freq: Once | ORAL | 0 refills | Status: AC

## 2021-01-05 NOTE — Progress Notes (Signed)
HPI :  70 year old female here for follow-up visit.  Recall she has a history of recurrent diverticulitis and constipation.  She was last seen by our office in March.  I have recommended a colonoscopy at that time in light of her history of diverticulitis and that her last colonoscopy was in 2010 in [place].  Unfortunately she was unable to proceed with colonoscopy since her last visit.  She unfortunately had developed septic arthritis and her partially replaced knee.  She has been admitted to the hospital for this and underwent multiple rounds of antibiotic's that has been difficult to treat.  During her course she has been on cephalosporins rifampin, and most recently doxycycline.  At one point during her course she developed severe abdominal pain concerning for diverticulitis.  A CT scan was performed fortunately which showed no evidence of diverticulitis in August.  She then developed dark loose stools and a C. difficile was checked and that was positive.  I had recommended treating her with vancomycin given her diarrhea however she states she stopped antibiotic, cephalosporin, and her diarrhea had resolved by the time she got the vancomycin.  Dr. Graylon Good of ID saw her and felt she was more likely colonized with C. difficile and not a true infection and so elected to hold off on therapy for that.  She states that her diarrhea has resolved and she is currently having constipation which is her baseline, she takes MiraLAX for this and that works well for her.  She has not had any blood in her stools.  Generally she has been doing better lately in regards to her bowels.  No recurrent pain.  She suspects she perhaps had antibiotic side effects.  She has been taking doxycycline for the past month and is going to be on a prolonged course of this.  She states she feels vertiginous at times and wondering if she is having side effects from doxycycline.  She is due to see Dr. Graylon Good in a few weeks.  We discussed  when she feels like she is ready for a colonoscopy as she is quite overdue for this especially in light of her history of diverticulitis.  She has quite a sensitive stomach and thinks she may have a hard time being on a liquid diet for a day while taking her antibiotics.  She denies any cardiopulmonary symptoms otherwise at this time.  Last colonoscopy done in [place] in 2010 April - normal exam other than diverticulosis and internal hemorrhoids. Capsule endoscopy 2010 - normal EGD 2010 - benign gastric polyp    CT abdomen / pelvis 09/19/19 - IMPRESSION: Changes consistent with diverticulitis in the distal sigmoid/rectosigmoid region. No abscess or perforation is noted.   CT abdomen / pelvis 09/24/19:IMPRESSION: 1. Sigmoid diverticulosis with mild sigmoid diverticulitis. 2. Small fat containing ventral hernia. 3. Stable grade 1 anterolisthesis of the L5 vertebral body on S1 with marked severity degenerative changes at this level.     Echo 04/06/20 - EF 55-60%  CT scan 11/28/20 - IMPRESSION: Sigmoid diverticulosis.  No changes of active diverticulitis     Past Medical History:  Diagnosis Date   Abdominal wall hernia 08/04/2012   Atrial fibrillation ([place]) 2008   REPORTS SHE WAS AT [place], UPON WAKING I FELT MY HEART GOING BOOM BOOM BOOM BEATING OUT OF MY CHEST. I WENT TO HOSPITAL AND THEY ISED MEDICINE TO CONVERT ME , IT TOOK OVER 10 HOURS TO CONVERT ME. BUT AFTER THAT IVE NEVER HAD ANY ISSUES SINCE.  I USED TO HAVE A CARDIOLOGUIST AS MY PRIMARY IN [place] BUT IHAVENT BEEN TO A CARDIOLOGIST SINCE I MOVED DOWN HERE.    Chicken pox as a child   Depression    Depression with anxiety 12/27/2012   Diverticulitis    DJD (degenerative joint disease) 08/05/2012   DVT (deep venous thrombosis) ([place]) 2018   Fifth disease 1989   DX AFTER EXPOSURE FROM HER CHILDREN; DEVELOPED RHEUMATOID ARTHRITIS DURING COURSE OF ILLNESS. REPORTS:  " I DONT HAVE IT ANYMORE SINCE THE DISEASE IS GONE"    H/O gestational  diabetes mellitus, not currently pregnant 08/05/2012   History of    Heart murmur    echo 2012; "I HAVE A FUNCTIONAL MURMUR"    Hyperlipidemia    Hypothyroidism    Knee effusion, right    while on Eliquis, discontinued Eliquis after DVT resolved   Measles as a child   Migraines    Other and unspecified hyperlipidemia 08/05/2012   Paroxysmal A-fib ([place])    Shingles 43   Spondylisthesis 2012   L5/S1 grade 3   Varicose veins 08/05/2012   Extending up to right groin B/l LE   Ventral hernia 08/05/2012   Wears glasses      Past Surgical History:  Procedure Laterality Date   BREAST DUCTAL SYSTEM EXCISION Right 05/05/2014   Procedure: RIGHT BREAST DUCT EXCISION;  Surgeon: Rolm Bookbinder, MD;  Location: [facility];  Service: General;  Laterality: Right;   COLONOSCOPY     DILATION AND CURETTAGE OF UTERUS     x4   EXCISIONAL TOTAL KNEE ARTHROPLASTY WITH ANTIBIOTIC SPACERS Right 09/14/2020   Procedure: Irrigation and debriement with poly exchange versus resection of right total knee arthroplasty and placement of antibitiotic spacer;  Surgeon: Paralee Cancel, MD;  Location: [facility];  Service: Orthopedics;  Laterality: Right;   MOUTH SURGERY     TOTAL KNEE ARTHROPLASTY Right 07/01/2017   Procedure: RIGHT TOTAL KNEE ARTHROPLASTY;  Surgeon: Paralee Cancel, MD;  Location: [facility];  Service: Orthopedics;  Laterality: Right;  23 mins   [place]   right leg   Family History  Problem Relation Age of Onset   Cancer Mother        lung- smoker   Liver disease Mother    Cancer Father        lung- smoker   Peripheral Artery Disease Sister    Heart attack Paternal Uncle    Dementia Maternal Grandmother    Heart attack Maternal Grandfather    Asthma Son    Ulcerative colitis Son    Asthma Son    Ulcerative colitis Son    Pancreatic cancer Other    Stomach cancer Other    Colon cancer Neg Hx    Cystic fibrosis Neg Hx    Esophageal cancer Neg Hx    Social History    Tobacco Use   Smoking status: Former    Years: 4.00    Types: Cigarettes   Smokeless tobacco: Never  Vaping Use   Vaping Use: Never used  Substance Use Topics   Alcohol use: No   Drug use: No   Current Outpatient Medications  Medication Sig Dispense Refill   acetaminophen (TYLENOL) 500 MG tablet Take 1,000 mg by mouth in the morning and at bedtime.     aspirin EC 81 MG tablet Take 81 mg by mouth daily. Swallow whole.     atorvastatin (LIPITOR) 10 MG tablet Take 0.5 tablets (5 mg  total) by mouth daily. 45 tablet 3   Cholecalciferol (PA VITAMIN D-3) 50 MCG (2000 UT) CAPS Take 4,000 Units by mouth every evening.     Coenzyme Q10 300 MG CAPS Take 300 mg by mouth every evening.     doxycycline (VIBRA-TABS) 100 MG tablet Take 1 tablet (100 mg total) by mouth 2 (two) times daily. Take on full stomach 60 tablet 3   levothyroxine (SYNTHROID) 100 MCG tablet Take 100 mcg by mouth daily before breakfast.     polyethylene glycol (MIRALAX / GLYCOLAX) 17 g packet Take 17 g by mouth 2 (two) times daily.     No current facility-administered medications for this visit.   Allergies  Allergen Reactions   Crestor [Rosuvastatin] Other (See Comments)    Joint pain w/fibromyalgia   Pravachol [Pravastatin] Other (See Comments)    Joint pain w/fibromyalgia   Cefazolin Rash     Review of Systems: All systems reviewed and negative except where noted in HPI.   Lab Results  Component Value Date   WBC 10.9 (H) 11/27/2020   HGB 12.3 11/27/2020   HCT 37.6 11/27/2020   MCV 88.1 11/27/2020   PLT 349 11/27/2020    Lab Results  Component Value Date   CREATININE 0.62 11/27/2020   BUN <5 (L) 11/27/2020   NA 135 11/27/2020   K 3.7 11/27/2020   CL 97 (L) 11/27/2020   CO2 28 11/27/2020    Lab Results  Component Value Date   ALT 14 11/27/2020   AST 18 11/27/2020   ALKPHOS 67 11/27/2020   BILITOT 1.2 11/27/2020      Physical Exam: BP (!) 84/56   Pulse 66   Ht 5\' 4"  (1.626 m)   Wt 130 lb  (59 kg) Comment: patient reported, refused to weigh  SpO2 99%   BMI 22.31 kg/m  Constitutional: Pleasant,well-developed, female in no acute distress. Neurological: Alert and oriented to person place and time. Psychiatric: Normal mood and affect. Behavior is normal.   ASSESSMENT AND PLAN: 70 year old female here for reassessment following:  History of diverticulitis C. difficile carrier Constipation  As above, the patient has had a few episodes of diverticulitis and is overdue for a colonoscopy to evaluate this.  Fortunately CT scan in August did not show active diverticulitis.  She did transiently have some loose stool that tested positive for C. difficile however her stools formed up on her own without treatment, vancomycin was held, thought to be likely more so to be a C. difficile carrier.  She remains constipated using MiraLAX as needed which is working well.  Her main issue has been his knee infection for which she has been cycled on a variety of antibiotics, she is hoping to avoid complete knee replacement she states that remains quite possible.  I would like to get her a colonoscopy as soon as she is physically able to tolerated given she is quite overdue for this.  I discussed risk benefits of colonoscopy and anesthesia and she understands and does wish to proceed, however she has quite a sensitive stomach and unclear if she will be able to stay on a clear liquid diet before to get the exam done.  If she wants to eat a light bland meal in order to tolerate her doxycycline the day before her procedure, I think that is okay to have her drink extra MiraLAX to make sure the prep was adequate.  She cannot eat any food prior to her colonoscopy the day of the exam.  If she absolutely does not think she can do that her case will need to be postponed.  Hopefully she can have a morning procedure and have her doxycycline after the exam.  She was tentatively added to the schedule for colonoscopy, she  will call us in the interim if she does not think she can proceed with colonoscopy based on her course over the next few weeks.  She understands would like to do this as soon as possible.  Otherwise agree with holding off on vancomycin therapy unless she has recurrent diarrhea concerning for active C diff.  Jolly Mango, MD [facility]

## 2021-01-05 NOTE — Patient Instructions (Addendum)
If you are age 70 or older, your body mass index should be between 23-30. Your Body mass index is 22.31 kg/m. If this is out of the aforementioned range listed, please consider follow up with your Primary Care Provider.  If you are age 46 or younger, your body mass index should be between 19-25. Your Body mass index is 22.31 kg/m. If this is out of the aformentioned range listed, please consider follow up with your Primary Care Provider.   __________________________________________________________  The [facility] providers would like to encourage you to use [facility] to communicate with providers for non-urgent requests or questions.  Due to long hold times on the telephone, sending your provider a message by [facility] may be a faster and more efficient way to get a response.  Please allow 48 business hours for a response.  Please remember that this is for non-urgent requests.   You have been scheduled for a colonoscopy. Please follow written instructions given to you at your visit today.  Please pick up your prep supplies at the pharmacy within the next 1-3 days. If you use inhalers (even only as needed), please bring them with you on the day of your procedure.  Continue Miralax.   Thank you for entrusting me with your care and for choosing [facility], Dr. Hamburg Cellar

## 2021-01-08 ENCOUNTER — Encounter: Payer: Self-pay | Admitting: Gastroenterology

## 2021-01-10 ENCOUNTER — Telehealth: Payer: Self-pay

## 2021-01-10 NOTE — Telephone Encounter (Signed)
Received call today from [place] at [facility] regarding outstanding orders for pt. Outstanding orders are from 6/30,7/18,7/31. Requested orders be re faxed to office for provider to sign. Will leave them in providers box in triage to sign when she returns to clinic.  Orders will need to be faxed to [facility] once signed. Leatrice Jewels, RMA

## 2021-01-17 DIAGNOSIS — M25661 Stiffness of right knee, not elsewhere classified: Secondary | ICD-10-CM | POA: Diagnosis not present

## 2021-01-18 ENCOUNTER — Other Ambulatory Visit: Payer: Self-pay

## 2021-01-18 ENCOUNTER — Encounter: Payer: Self-pay | Admitting: Internal Medicine

## 2021-01-18 ENCOUNTER — Ambulatory Visit (INDEPENDENT_AMBULATORY_CARE_PROVIDER_SITE_OTHER): Payer: Medicare Other | Admitting: Internal Medicine

## 2021-01-18 VITALS — BP 106/68 | HR 56 | Temp 97.9°F | Resp 16 | Ht 64.0 in

## 2021-01-18 DIAGNOSIS — E039 Hypothyroidism, unspecified: Secondary | ICD-10-CM | POA: Diagnosis not present

## 2021-01-18 DIAGNOSIS — M009 Pyogenic arthritis, unspecified: Secondary | ICD-10-CM | POA: Diagnosis not present

## 2021-01-18 NOTE — Progress Notes (Signed)
RFV: S.mutans PJI  Patient ID: Crystal Russell, female   DOB: 1950-10-11, 70 y.o.   MRN: [id number]  HPI Crystal Russell is a 70 yo F who had right knee pji s/p 1 staged revision with polyethylene revision/open excisional and non-excisional debridement of right knee by dr Alvan Dame on June 10th, 2022. In terms of micro data-  Has staph species on synovasure but strep mutans on cx results. Documents will be placed in media. She finished 6 wk of iv abtx and now has been tolerating doxycycline. She had rash and intolerance to cephalosporins. She states that she has  Headache daily .Bouts of nausea, but still appetite. With occasional Vertigo symptoms. Still not at her full baselinse    Outpatient Encounter Medications as of 01/18/2021  Medication Sig   acetaminophen (TYLENOL) 500 MG tablet Take 1,000 mg by mouth in the morning and at bedtime.   aspirin EC 81 MG tablet Take 81 mg by mouth daily. Swallow whole.   atorvastatin (LIPITOR) 10 MG tablet Take 0.5 tablets (5 mg total) by mouth daily.   Cholecalciferol (PA VITAMIN D-3) 50 MCG (2000 UT) CAPS Take 4,000 Units by mouth every evening.   Coenzyme Q10 300 MG CAPS Take 300 mg by mouth every evening.   doxycycline (VIBRA-TABS) 100 MG tablet Take 1 tablet (100 mg total) by mouth 2 (two) times daily. Take on full stomach   levothyroxine (SYNTHROID) 100 MCG tablet Take 100 mcg by mouth daily before breakfast.   polyethylene glycol (MIRALAX / GLYCOLAX) 17 g packet Take 17 g by mouth 2 (two) times daily.   No facility-administered encounter medications on file as of 01/18/2021.     Patient Active Problem List   Diagnosis Date Noted   Infection of prosthetic right knee joint ([place]) 09/14/2020   PAC (premature atrial contraction) 07/28/2020   PAF (paroxysmal atrial fibrillation) ([place]) 05/01/2020   Mild aortic valve regurgitation 05/01/2020   Mild mitral regurgitation 05/01/2020   PFO (patent foramen ovale) 05/01/2020   Laceration of skin of right hand  03/19/2020   Diverticulitis 03/16/2020   Hyperglycemia 03/16/2020   Vertigo 09/14/2019   Status post total right knee replacement 07/02/2017   S/P TKR (total knee replacement) 07/01/2017   Arthritis, septic, knee ([place]) 06/16/2017   Postmenopausal bleeding 12/06/2015   Varicose veins of bilateral lower extremities with other complications 09/32/3557   Sun-damaged skin 12/04/2014   Breast mass, left 05/16/2014   Varicose veins of lower extremities with other complications 32/20/2542   Preventative health care 08/08/2013   Hypothyroidism 08/08/2013   Atrophic vaginitis 08/08/2013   Cervical cancer screening 08/05/2013   ETD (eustachian tube dysfunction) 05/26/2013   Depression with anxiety 12/27/2012   Ventral hernia 08/05/2012   Varicose veins 08/05/2012   DJD (degenerative joint disease) 08/05/2012   Constipation 08/05/2012   Hyperlipidemia, mixed 08/05/2012   H/O gestational diabetes mellitus, not currently pregnant 08/05/2012   Chicken pox    Shingles    Paroxysmal A-fib ([place])    Heart murmur    Spondylisthesis      Health Maintenance Due  Topic Date Due   Hepatitis C Screening  Never done   Zoster Vaccines- Shingrix (1 of 2) Never done   COLONOSCOPY (Pts 45-34yrs Insurance coverage will need to be confirmed)  07/15/2018   COVID-19 Vaccine (4 - Booster for [place] series) 04/17/2020   INFLUENZA VACCINE  11/06/2020     Review of Systems 12 point ros is negative except what is mentioned above in hpi Physical Exam  BP 106/68   Pulse (!) 56   Temp 97.9 F (36.6 C) (Oral)   Resp 16   Ht 5\' 4"  (1.626 m)   SpO2 100%   BMI 22.31 kg/m   Physical Exam  Constitutional:  oriented to person, place, and time. appears well-developed and well-nourished. No distress.  HENT: [place]/AT, PERRLA, no scleral icterus Mouth/Throat: Oropharynx is clear and moist. No oropharyngeal exudate.  Ext: knees not swollen or warm to touch Lymphadenopathy: no cervical adenopathy. No axillary  adenopathy Neurological: alert and oriented to person, place, and time.  Skin: Skin is warm and dry. No rash noted. No erythema.  Psychiatric: a normal mood and affect.  behavior is normal.   CBC Lab Results  Component Value Date   WBC 10.9 (H) 11/27/2020   RBC 4.27 11/27/2020   HGB 12.3 11/27/2020   HCT 37.6 11/27/2020   PLT 349 11/27/2020   MCV 88.1 11/27/2020   MCH 28.8 11/27/2020   MCHC 32.7 11/27/2020   RDW 12.7 11/27/2020   LYMPHSABS 1.3 11/27/2020   MONOABS 1.0 11/27/2020   EOSABS 0.1 11/27/2020    BMET Lab Results  Component Value Date   NA 135 11/27/2020   K 3.7 11/27/2020   CL 97 (L) 11/27/2020   CO2 28 11/27/2020   GLUCOSE 112 (H) 11/27/2020   BUN <5 (L) 11/27/2020   CREATININE 0.62 11/27/2020   CALCIUM 9.4 11/27/2020   GFRNONAA >60 11/27/2020   GFRAA >60 09/19/2019   Lab Results  Component Value Date   ESRSEDRATE 9 01/18/2021   Lab Results  Component Value Date   CRP 0.8 01/18/2021      Assessment and Plan  4 months out from washout. Original plans for her to get treatment for 6 months to avoid having repeat surgery. Initially reported to have s.mutans infection per orthopedics. - will check inflammatory markers - plan to see her at 6 month mark, early december Doxycycline can cause some CNS effects including headache and vertigo. Patient is willing to tolerate. Alternatively, can consider during Q2-3 wk infusion of long acting  - hypothyroidsim = had thyroid dosing change when she previously was on rifampin. But now feeling sluggish.

## 2021-01-19 LAB — BASIC METABOLIC PANEL
BUN/Creatinine Ratio: 16 (calc) (ref 6–22)
BUN: 9 mg/dL (ref 7–25)
CO2: 31 mmol/L (ref 20–32)
Calcium: 9.3 mg/dL (ref 8.6–10.4)
Chloride: 96 mmol/L — ABNORMAL LOW (ref 98–110)
Creat: 0.55 mg/dL — ABNORMAL LOW (ref 0.60–1.00)
Glucose, Bld: 85 mg/dL (ref 65–99)
Potassium: 4.5 mmol/L (ref 3.5–5.3)
Sodium: 133 mmol/L — ABNORMAL LOW (ref 135–146)

## 2021-01-19 LAB — C-REACTIVE PROTEIN: CRP: 0.8 mg/L (ref <8.0)

## 2021-01-19 LAB — CBC WITH DIFFERENTIAL/PLATELET
Absolute Monocytes: 361 cells/uL (ref 200–950)
Basophils Absolute: 41 cells/uL (ref 0–200)
Basophils Relative: 1 %
Eosinophils Absolute: 82 cells/uL (ref 15–500)
Eosinophils Relative: 2 %
HCT: 34.8 % — ABNORMAL LOW (ref 35.0–45.0)
Hemoglobin: 11.3 g/dL — ABNORMAL LOW (ref 11.7–15.5)
Lymphs Abs: 1320 cells/uL (ref 850–3900)
MCH: 28.1 pg (ref 27.0–33.0)
MCHC: 32.5 g/dL (ref 32.0–36.0)
MCV: 86.6 fL (ref 80.0–100.0)
MPV: 9.3 fL (ref 7.5–12.5)
Monocytes Relative: 8.8 %
Neutro Abs: 2296 cells/uL (ref 1500–7800)
Neutrophils Relative %: 56 %
Platelets: 268 10*3/uL (ref 140–400)
RBC: 4.02 10*6/uL (ref 3.80–5.10)
RDW: 12 % (ref 11.0–15.0)
Total Lymphocyte: 32.2 %
WBC: 4.1 10*3/uL (ref 3.8–10.8)

## 2021-01-19 LAB — SEDIMENTATION RATE: Sed Rate: 9 mm/h (ref 0–30)

## 2021-01-24 DIAGNOSIS — Z96651 Presence of right artificial knee joint: Secondary | ICD-10-CM | POA: Diagnosis not present

## 2021-01-29 ENCOUNTER — Other Ambulatory Visit: Payer: Self-pay

## 2021-01-29 ENCOUNTER — Other Ambulatory Visit: Payer: Self-pay | Admitting: Family Medicine

## 2021-02-01 DIAGNOSIS — M25661 Stiffness of right knee, not elsewhere classified: Secondary | ICD-10-CM | POA: Diagnosis not present

## 2021-02-14 ENCOUNTER — Encounter: Payer: Medicare Other | Admitting: Gastroenterology

## 2021-03-14 ENCOUNTER — Other Ambulatory Visit: Payer: Self-pay

## 2021-03-14 ENCOUNTER — Ambulatory Visit (HOSPITAL_COMMUNITY): Payer: Medicare Other | Attending: Internal Medicine

## 2021-03-14 DIAGNOSIS — I351 Nonrheumatic aortic (valve) insufficiency: Secondary | ICD-10-CM | POA: Diagnosis not present

## 2021-03-14 LAB — ECHOCARDIOGRAM COMPLETE
Area-P 1/2: 2.84 cm2
MV M vel: 5.81 m/s
MV Peak grad: 135 mmHg
P 1/2 time: 772 msec
Radius: 0.45 cm
S' Lateral: 2.8 cm

## 2021-03-20 ENCOUNTER — Ambulatory Visit (INDEPENDENT_AMBULATORY_CARE_PROVIDER_SITE_OTHER): Payer: Medicare Other | Admitting: Internal Medicine

## 2021-03-20 ENCOUNTER — Encounter: Payer: Self-pay | Admitting: Internal Medicine

## 2021-03-20 ENCOUNTER — Other Ambulatory Visit: Payer: Self-pay

## 2021-03-20 VITALS — BP 137/84 | HR 49 | Temp 97.6°F | Ht 64.0 in

## 2021-03-20 DIAGNOSIS — Z87898 Personal history of other specified conditions: Secondary | ICD-10-CM

## 2021-03-20 DIAGNOSIS — M009 Pyogenic arthritis, unspecified: Secondary | ICD-10-CM | POA: Diagnosis not present

## 2021-03-20 MED ORDER — DOXYCYCLINE MONOHYDRATE 100 MG PO CAPS: 100.0000 mg | ORAL_CAPSULE | Freq: Two times a day (BID) | ORAL | 3 refills | Status: DC

## 2021-03-20 NOTE — Progress Notes (Signed)
RFV: follow up for PJI  Patient ID: Crystal Russell, female   DOB: 10/03/1950, 70 y.o.   MRN: [id number]  HPI  70 yo F who had right knee pji s/p 1 staged revision with polyethylene revision/open excisional and non-excisional debridement of right knee by dr Alvan Dame on June 10th, 2022. In terms of micro data-  Has staph species on synovasure but strep mutans on cx results. Documents will be placed in media. She finished 6 wk of iv abtx and now has been tolerating doxycycline. She had rash and intolerance to cephalosporins.  Has been noticing HR in 180s. Following up with cardiology. Sees dr Alvan Dame tomorrow.  Starting to notice decrease range of motion, now having decrease distance.   Right knee still alitte tenderness still  Outpatient Encounter Medications as of 03/20/2021  Medication Sig   acetaminophen (TYLENOL) 500 MG tablet Take 1,000 mg by mouth in the morning and at bedtime.   aspirin EC 81 MG tablet Take 81 mg by mouth daily. Swallow whole.   atorvastatin (LIPITOR) 10 MG tablet Take 0.5 tablets (5 mg total) by mouth daily.   Cholecalciferol (PA VITAMIN D-3) 50 MCG (2000 UT) CAPS Take 4,000 Units by mouth every evening.   Coenzyme Q10 300 MG CAPS Take 300 mg by mouth every evening.   doxycycline (VIBRA-TABS) 100 MG tablet Take 1 tablet (100 mg total) by mouth 2 (two) times daily. Take on full stomach   levothyroxine (SYNTHROID) 100 MCG tablet TAKE ONE TABLET BY MOUTH DAILY BEFORE BREAKFAST   polyethylene glycol (MIRALAX / GLYCOLAX) 17 g packet Take 17 g by mouth 2 (two) times daily.   No facility-administered encounter medications on file as of 03/20/2021.     Patient Active Problem List   Diagnosis Date Noted   Infection of prosthetic right knee joint ([place]) 09/14/2020   PAC (premature atrial contraction) 07/28/2020   PAF (paroxysmal atrial fibrillation) ([place]) 05/01/2020   Mild aortic valve regurgitation 05/01/2020   Mild mitral regurgitation 05/01/2020   PFO (patent foramen  ovale) 05/01/2020   Laceration of skin of right hand 03/19/2020   Diverticulitis 03/16/2020   Hyperglycemia 03/16/2020   Vertigo 09/14/2019   Status post total right knee replacement 07/02/2017   S/P TKR (total knee replacement) 07/01/2017   Arthritis, septic, knee ([place]) 06/16/2017   Postmenopausal bleeding 12/06/2015   Varicose veins of bilateral lower extremities with other complications 54/56/2563   Sun-damaged skin 12/04/2014   Breast mass, left 05/16/2014   Varicose veins of lower extremities with other complications 89/37/3428   Preventative health care 08/08/2013   Hypothyroidism 08/08/2013   Atrophic vaginitis 08/08/2013   Cervical cancer screening 08/05/2013   ETD (eustachian tube dysfunction) 05/26/2013   Depression with anxiety 12/27/2012   Ventral hernia 08/05/2012   Varicose veins 08/05/2012   DJD (degenerative joint disease) 08/05/2012   Constipation 08/05/2012   Hyperlipidemia, mixed 08/05/2012   H/O gestational diabetes mellitus, not currently pregnant 08/05/2012   Chicken pox    Shingles    Paroxysmal A-fib ([place])    Heart murmur    Spondylisthesis      Health Maintenance Due  Topic Date Due   Hepatitis C Screening  Never done   Zoster Vaccines- Shingrix (1 of 2) Never done   COLONOSCOPY (Pts 45-18yrs Insurance coverage will need to be confirmed)  07/15/2018    Sochx: no smoking or etoh Review of Systems 12 point ros is negative Physical Exam   BP 137/84    Pulse (!) 49  Temp 97.6 F (36.4 C) (Oral)    Ht 5\' 4"  (1.626 m)    SpO2 100%    BMI 22.31 kg/m   Physical Exam  Constitutional:  oriented to person, place, and time. appears well-developed and well-nourished. No distress.  HENT: [place]/AT, PERRLA, no scleral icterus Mouth/Throat: Oropharynx is clear and moist. No oropharyngeal exudate.  Msk: knees are not swollen, no effusion, or erythema Neurological: alert and oriented to person, place, and time.  Skin: Skin is warm and dry. No rash noted. No  erythema.  Psychiatric: a normal mood and affect.  behavior is normal.   CBC Lab Results  Component Value Date   WBC 4.1 01/18/2021   RBC 4.02 01/18/2021   HGB 11.3 (L) 01/18/2021   HCT 34.8 (L) 01/18/2021   PLT 268 01/18/2021   MCV 86.6 01/18/2021   MCH 28.1 01/18/2021   MCHC 32.5 01/18/2021   RDW 12.0 01/18/2021   LYMPHSABS 1,320 01/18/2021   MONOABS 1.0 11/27/2020   EOSABS 82 01/18/2021    BMET Lab Results  Component Value Date   NA 133 (L) 01/18/2021   K 4.5 01/18/2021   CL 96 (L) 01/18/2021   CO2 31 01/18/2021   GLUCOSE 85 01/18/2021   BUN 9 01/18/2021   CREATININE 0.55 (L) 01/18/2021   CALCIUM 9.3 01/18/2021   GFRNONAA >60 11/27/2020   GFRAA >60 09/19/2019    Lab Results  Component Value Date   ESRSEDRATE 9 03/20/2021   Lab Results  Component Value Date   CRP 1.0 03/20/2021     Assessment and Plan  Chronic pji = continue on doxycycyline. Will check inflammatory markers to ensure she is still WNL. Patient is at 6 month mark but would like to continue due to avoid having repeat surgery. She has discussed with dr Alvan Dame to being on chronic suppression. Will continue for the time being at doxy 100mg  bid. Discussed to wearing sunscreen and taking on full stomach to minimize nausea. I am amenable to having her continue with chronic suppression for the time being as she tolerates. Have discussed that usually 6 months is sufficient but she is worried that she will have relapse since she still has some retained original HW  Hx of tachycardia= unclear if deconditioning. Appears to have symptomatic palpitations. Currently on vitals HR is 50. Will have her follow up with PCP/cardiology

## 2021-03-21 DIAGNOSIS — Z96651 Presence of right artificial knee joint: Secondary | ICD-10-CM | POA: Diagnosis not present

## 2021-03-21 LAB — CBC WITH DIFFERENTIAL/PLATELET
Absolute Monocytes: 378 cells/uL (ref 200–950)
Basophils Absolute: 32 cells/uL (ref 0–200)
Basophils Relative: 0.7 %
Eosinophils Absolute: 90 cells/uL (ref 15–500)
Eosinophils Relative: 2 %
HCT: 38.7 % (ref 35.0–45.0)
Hemoglobin: 12.5 g/dL (ref 11.7–15.5)
Lymphs Abs: 1602 cells/uL (ref 850–3900)
MCH: 28.5 pg (ref 27.0–33.0)
MCHC: 32.3 g/dL (ref 32.0–36.0)
MCV: 88.2 fL (ref 80.0–100.0)
MPV: 9.7 fL (ref 7.5–12.5)
Monocytes Relative: 8.4 %
Neutro Abs: 2399 cells/uL (ref 1500–7800)
Neutrophils Relative %: 53.3 %
Platelets: 266 10*3/uL (ref 140–400)
RBC: 4.39 10*6/uL (ref 3.80–5.10)
RDW: 12.4 % (ref 11.0–15.0)
Total Lymphocyte: 35.6 %
WBC: 4.5 10*3/uL (ref 3.8–10.8)

## 2021-03-21 LAB — BASIC METABOLIC PANEL
BUN/Creatinine Ratio: 28 (calc) — ABNORMAL HIGH (ref 6–22)
BUN: 15 mg/dL (ref 7–25)
CO2: 29 mmol/L (ref 20–32)
Calcium: 9.5 mg/dL (ref 8.6–10.4)
Chloride: 102 mmol/L (ref 98–110)
Creat: 0.53 mg/dL — ABNORMAL LOW (ref 0.60–1.00)
Glucose, Bld: 85 mg/dL (ref 65–99)
Potassium: 4.8 mmol/L (ref 3.5–5.3)
Sodium: 137 mmol/L (ref 135–146)

## 2021-03-21 LAB — C-REACTIVE PROTEIN: CRP: 1 mg/L (ref <8.0)

## 2021-03-21 LAB — SEDIMENTATION RATE: Sed Rate: 9 mm/h (ref 0–30)

## 2021-03-29 ENCOUNTER — Ambulatory Visit (INDEPENDENT_AMBULATORY_CARE_PROVIDER_SITE_OTHER): Payer: Medicare Other | Admitting: Family Medicine

## 2021-03-29 ENCOUNTER — Encounter: Payer: Self-pay | Admitting: Family Medicine

## 2021-03-29 VITALS — BP 108/66 | HR 62 | Temp 97.8°F | Resp 16

## 2021-03-29 DIAGNOSIS — Z96651 Presence of right artificial knee joint: Secondary | ICD-10-CM

## 2021-03-29 DIAGNOSIS — I480 Paroxysmal atrial fibrillation: Secondary | ICD-10-CM | POA: Diagnosis not present

## 2021-03-29 DIAGNOSIS — F418 Other specified anxiety disorders: Secondary | ICD-10-CM

## 2021-03-29 DIAGNOSIS — E039 Hypothyroidism, unspecified: Secondary | ICD-10-CM | POA: Diagnosis not present

## 2021-03-29 DIAGNOSIS — R739 Hyperglycemia, unspecified: Secondary | ICD-10-CM | POA: Diagnosis not present

## 2021-03-29 DIAGNOSIS — E782 Mixed hyperlipidemia: Secondary | ICD-10-CM | POA: Diagnosis not present

## 2021-03-29 DIAGNOSIS — M009 Pyogenic arthritis, unspecified: Secondary | ICD-10-CM | POA: Diagnosis not present

## 2021-03-29 NOTE — Assessment & Plan Note (Signed)
hgba1c acceptable, minimize simple carbs. Increase exercise as tolerated.  

## 2021-03-29 NOTE — Assessment & Plan Note (Signed)
On Levothyroxine, continue to monitor 

## 2021-03-29 NOTE — Assessment & Plan Note (Addendum)
After knee replacement this past year. She had to have a debridement, PICC line with antibiotics. She is now on daily Doxycycline will continue. Voltaren gel is helping and can add Lidocaine gel to it. Is using a stationary bike, walking and stretching

## 2021-03-29 NOTE — Assessment & Plan Note (Signed)
Patient acknowledges she has been very anxious and stressed with her joint infection but for now she feels she is managing and does not want to start counseling or meds. She will let us know if she changes her mind

## 2021-03-29 NOTE — Assessment & Plan Note (Signed)
Tolerating statin, encouraged heart healthy diet, avoid trans fats, minimize simple carbs and saturated fats. Increase exercise as tolerated 

## 2021-03-29 NOTE — Patient Instructions (Addendum)
Aspercreme (lidocaine) roll-on  Clostridioides Difficile Infection Clostridioides difficile infection, also known as C. difficile or C. diff infection, happens when too much C. diff bacteria grows. This can cause severe diarrhea and inflammation of the colon (colitis). It is linked to recent use of antibiotic medicine. This infection can be passed from person to person (is contagious). You also may be exposed to the bacteria from contact with food, water, or surfaces that have the bacteria on them. What are the causes? Certain bacteria live in the colon and help to digest food. This infection develops when the balance of helpful bacteria in the colon changes and the C. diff bacteria grow out of control. This is often caused by taking antibiotics. What increases the risk? You may be more likely to develop this condition if you: Take certain antibiotics that kill many types of bacteria or take antibiotics for a long time. Have an extended stay in a hospital or long-term care facility. Are older than age 10. Have had a C. diff infection before or have been exposed to C. diff bacteria. Have a weakened disease-fighting system (immune system). Take a medicine to reduce stomach acid, such as a proton pump inhibitor, for a long time. Have a serious underlying condition, such as colon cancer or inflammatory bowel disease (IBD). Have had a gastrointestinal (GI) tract procedure. What are the signs or symptoms? Symptoms of this condition include: Diarrhea (three or more times a day) for several days. Fever. Loss of appetite. Nausea. Swelling, pain, cramping, or tenderness in the abdomen. How is this diagnosed? This condition is diagnosed with: Your medical history and a physical exam. Tests, which may include: A test for C. diff in your stool (feces). Blood tests. Imaging tests, such as a CT scan of your abdomen. A procedure in which your colon is examined. This is rare. How is this  treated? Treatment for this condition may include: Stopping the antibiotics that you were taking when the C. diff infection began. Do this only as told by your health care provider. Taking certain antibiotics to stop C. diff growth. Taking stool from a healthy person and placing it into your colon (fecal transplant). This may be done if the infection keeps coming back. Having surgery to remove the infected part of the colon. This is rare. Follow these instructions at home: Medicines Take over-the-counter and prescription medicines only as told by your health care provider. Take antibiotic medicine as told by your health care provider. Do not stop taking the antibiotic even if you start to feel better. Do not treat diarrhea with medicines unless your health care provider tells you to. Eating and drinking  Follow instructions from your health care provider about eating and drinking restrictions. Eat bland foods in small amounts that are easy to digest. These include bananas, applesauce, rice, lean meats, toast, and crackers. Follow instructions on replacing body fluid that has been lost (rehydrate). This may include: Drinking clear fluids, such as water, clear fruit juice that is diluted with water, and low-calorie sports drinks. Sucking on ice chips. Taking an oral rehydration solution (ORS). This drink is sold at pharmacies and retail stores. Avoid milk, caffeine, and alcohol. Drink enough fluid to keep your urine pale yellow. General instructions Wash your hands often with soap and water for at least 20 seconds. Bathe or shower using soap and water daily. Return to your normal activities as told by your health care provider. Ask your health care provider what activities are safe for you. Be  sure your home is clean before you leave the hospital or clinic to go home. Then continue daily cleaning for at least a week. Keep all follow-up visits. This is important. How is this prevented? Hand  hygiene  Wash your hands with soap and water for at least 20 seconds before preparing food and after using the bathroom. Make sure the people you live with also wash their hands often with soap and water for at least 20 seconds. If you are being treated at a hospital or clinic, make sure that all health care providers and visitors wash their hands with soap and water before touching you. Contact precautions Tell your health care team right away if you develop diarrhea while in a hospital or long-term care facility. When visiting someone in a hospital or a long-term care facility, follow guidelines for wearing a gown, gloves, or other protective equipment. If possible, avoid contact with people who have diarrhea. Use a separate bathroom if you are sick and live with other people, if possible. Clean environment Clean surfaces that are touched often every day. C. diff bacteria are killed only by cleaning products that contain 10% chlorine bleach solution. Be sure to: Read the product's label to make sure the product will kill the bacteria on the surface you are cleaning. Clean frequently touched surfaces, such as toilet seats and flush handles, bathtubs, sinks, doorknobs, and work surfaces. If you are in the hospital, make sure that staff members clean the surfaces in your room daily. Let a staff person know right away if body fluids have splashed or spilled. Washing clothes and linens Use a powder laundry detergent containing chlorine bleach instead of liquid detergent. Powder detergents contain chlorine bleach in low levels to help kill bacteria. Run your empty washing machine on the hot setting once a month with enough detergent for a full load. This will kill any remaining C. diff bacteria. Contact a health care provider if: Your symptoms do not get better, or they get worse, even with treatment. Your symptoms go away and then come back. You have a fever. You develop new symptoms. Get help  right away if: You have more pain or tenderness in your abdomen. You have stool that is mostly bloody, or looks black and tarry. You cannot eat or drink without vomiting. You have signs of dehydration, such as: Dark urine, very little urine, or no urine. Cracked lips or dry mouth. Not making tears when you cry. Sunken eyes. Sleepiness. Weakness or dizziness. Summary Clostridioides difficile infection, or C. diff infection, can cause severe diarrhea and inflammation of the colon (colitis). It is linked to recent antibiotic use. C. diff infection can spread from person to person (is contagious). You also may be exposed to the bacteria from contact with food, water, or surfaces that have the bacteria on them. This infection may be treated by stopping the antibiotics you were using when the infection began. Fecal transplant or surgery may be needed for repeat or severe infections. Washing hands with soap and water for at least 20 seconds after you use the bathroom and before you eat, and cleaning surfaces with a 10% bleach solution, can help prevent or limit spread of this infection. This information is not intended to replace advice given to you by your health care provider. Make sure you discuss any questions you have with your health care provider. Document Revised: 07/15/2019 Document Reviewed: 07/15/2019 Elsevier Patient Education  [place].

## 2021-03-29 NOTE — Progress Notes (Signed)
Patient ID: Crystal Russell, female    DOB: Feb 08, 1951  Age: 70 y.o. MRN: [id number]    Subjective:   Chief Complaint  Patient presents with   6 months follow up   Subjective   HPI Crystal Russell presents for office visit today for follow up on previous knee surgery and Diverticulitis. She had her knee surgery on June 9th and states that she chose to stay on doxycycline indefinitely. She still gets symptoms of nausea and hunger sometimes. She uses Voltaren cream which she states is working well for her. Denies CP/palp/SOB/HA/congestion/fevers or GU c/o. Taking meds as prescribed.  She was c. Diff positived when she was scheduled for colonoscopy, so she did not go for it and cancelled it. No bloody stool. Her son has colitis she states.   Review of Systems  Constitutional:  Negative for chills, fatigue and fever.  HENT:  Negative for congestion, rhinorrhea, sinus pressure, sinus pain and sore throat.   Eyes:  Negative for pain.  Respiratory:  Negative for cough and shortness of breath.   Cardiovascular:  Negative for chest pain, palpitations and leg swelling.  Gastrointestinal:  Negative for abdominal pain, blood in stool, diarrhea, nausea and vomiting.  Genitourinary:  Negative for decreased urine volume, flank pain, frequency, vaginal bleeding and vaginal discharge.  Musculoskeletal:  Negative for back pain.  Neurological:  Negative for headaches.   History Past Medical History:  Diagnosis Date   Abdominal wall hernia 08/04/2012   Atrial fibrillation ([place]) 2008   REPORTS SHE WAS AT [place], UPON WAKING I FELT MY HEART GOING BOOM BOOM BOOM BEATING OUT OF MY CHEST. I WENT TO HOSPITAL AND THEY ISED MEDICINE TO CONVERT ME , IT TOOK OVER 10 HOURS TO CONVERT ME. BUT AFTER THAT IVE NEVER HAD ANY ISSUES SINCE. I USED TO HAVE A CARDIOLOGUIST AS MY PRIMARY IN [place] BUT IHAVENT BEEN TO A CARDIOLOGIST SINCE I MOVED DOWN HERE.    Chicken pox as a child   Depression    Depression with anxiety  12/27/2012   Diverticulitis    DJD (degenerative joint disease) 08/05/2012   DVT (deep venous thrombosis) ([place]) 2018   Fifth disease 1989   DX AFTER EXPOSURE FROM HER CHILDREN; DEVELOPED RHEUMATOID ARTHRITIS DURING COURSE OF ILLNESS. REPORTS:  " I DONT HAVE IT ANYMORE SINCE THE DISEASE IS GONE"    H/O gestational diabetes mellitus, not currently pregnant 08/05/2012   History of    Heart murmur    echo 2012; "I HAVE A FUNCTIONAL MURMUR"    Hyperlipidemia    Hypothyroidism    Knee effusion, right    while on Eliquis, discontinued Eliquis after DVT resolved   Measles as a child   Migraines    Other and unspecified hyperlipidemia 08/05/2012   Paroxysmal A-fib ([place])    Shingles 43   Spondylisthesis 2012   L5/S1 grade 3   Varicose veins 08/05/2012   Extending up to right groin B/l LE   Ventral hernia 08/05/2012   Wears glasses     She has a past surgical history that includes Varicose vein surgery (1995); Dilation and curettage of uterus; Colonoscopy; Breast ductal system excision (Right, 05/05/2014); Total knee arthroplasty (Right, 07/01/2017); Mouth surgery; and Excisional total knee arthroplasty with antibiotic spacers (Right, 09/14/2020).   Her family history includes Asthma in her son and son; Cancer in her father and mother; Dementia in her maternal grandmother; Heart attack in her maternal grandfather and paternal uncle; Liver disease in her mother; Pancreatic cancer in  an other family member; Peripheral Artery Disease in her sister; Stomach cancer in an other family member; Ulcerative colitis in her son and son.She reports that she has quit smoking. Her smoking use included cigarettes. She has never used smokeless tobacco. She reports that she does not drink alcohol and does not use drugs.  Current Outpatient Medications on File Prior to Visit  Medication Sig Dispense Refill   acetaminophen (TYLENOL) 500 MG tablet Take 1,000 mg by mouth in the morning and at bedtime.     aspirin EC 81 MG  tablet Take 81 mg by mouth daily. Swallow whole.     atorvastatin (LIPITOR) 10 MG tablet Take 0.5 tablets (5 mg total) by mouth daily. 45 tablet 3   Cholecalciferol (PA VITAMIN D-3) 50 MCG (2000 UT) CAPS Take 4,000 Units by mouth every evening.     Coenzyme Q10 300 MG CAPS Take 300 mg by mouth every evening.     doxycycline (MONODOX) 100 MG capsule Take 1 capsule (100 mg total) by mouth 2 (two) times daily. 180 capsule 3   levothyroxine (SYNTHROID) 100 MCG tablet TAKE ONE TABLET BY MOUTH DAILY BEFORE BREAKFAST 90 tablet 0   polyethylene glycol (MIRALAX / GLYCOLAX) 17 g packet Take 17 g by mouth 2 (two) times daily.     No current facility-administered medications on file prior to visit.     Objective:  Objective  Physical Exam Constitutional:      General: She is not in acute distress.    Appearance: Normal appearance. She is not ill-appearing or toxic-appearing.  HENT:     Head: Normocephalic and atraumatic.     Right Ear: Tympanic membrane, ear canal and external ear normal.     Left Ear: Tympanic membrane, ear canal and external ear normal.     Nose: No congestion or rhinorrhea.  Eyes:     Extraocular Movements: Extraocular movements intact.     Pupils: Pupils are equal, round, and reactive to light.  Cardiovascular:     Rate and Rhythm: Normal rate and regular rhythm.     Pulses: Normal pulses.     Heart sounds: Normal heart sounds. No murmur heard. Pulmonary:     Effort: Pulmonary effort is normal. No respiratory distress.     Breath sounds: Normal breath sounds. No wheezing, rhonchi or rales.  Abdominal:     General: Bowel sounds are normal.     Palpations: Abdomen is soft. There is no mass.     Tenderness: There is no abdominal tenderness. There is no guarding.     Hernia: No hernia is present.  Musculoskeletal:        General: Normal range of motion.     Cervical back: Normal range of motion and neck supple.  Skin:    General: Skin is warm and dry.  Neurological:      Mental Status: She is alert and oriented to person, place, and time.  Psychiatric:        Behavior: Behavior normal.   BP 108/66    Pulse 62    Temp 97.8 F (36.6 C)    Resp 16    SpO2 99%  Wt Readings from Last 3 Encounters:  01/05/21 130 lb (59 kg)  11/27/20 122 lb (55.3 kg)  11/03/20 130 lb (59 kg)     Lab Results  Component Value Date   WBC 4.5 03/20/2021   HGB 12.5 03/20/2021   HCT 38.7 03/20/2021   PLT 266 03/20/2021   GLUCOSE 85  03/20/2021   CHOL 182 03/16/2020   TRIG 58.0 03/16/2020   HDL 63.00 03/16/2020   LDLCALC 108 (H) 03/16/2020   ALT 14 11/27/2020   AST 18 11/27/2020   NA 137 03/20/2021   K 4.8 03/20/2021   CL 102 03/20/2021   CREATININE 0.53 (L) 03/20/2021   BUN 15 03/20/2021   CO2 29 03/20/2021   TSH 7.869 (H) 10/05/2020   HGBA1C 5.6 03/16/2020    CT ABDOMEN PELVIS W CONTRAST  Addendum Date: 11/28/2020   ADDENDUM REPORT: 11/28/2020 16:09 ADDENDUM: Error within the original report. The patient is not status post hysterectomy. Reproductive should read Reproductive: Uterus and adnexa unremarkable.  No mass. Electronically Signed   By: Rolm Baptise M.D.   On: 11/28/2020 16:09   Result Date: 11/28/2020 CLINICAL DATA:  Diverticulitis, complication suspected. EXAM: CT ABDOMEN AND PELVIS WITH CONTRAST TECHNIQUE: Multidetector CT imaging of the abdomen and pelvis was performed using the standard protocol following bolus administration of intravenous contrast. CONTRAST:  132mL OMNIPAQUE IOHEXOL 350 MG/ML SOLN COMPARISON:  09/24/2019 FINDINGS: Lower chest: Lung bases are clear. No effusions. Heart is normal size. Hepatobiliary: No focal hepatic abnormality. Gallbladder unremarkable. Pancreas: No focal abnormality or ductal dilatation. Spleen: No focal abnormality.  Normal size. Adrenals/Urinary Tract: No adrenal abnormality. No focal renal abnormality. No stones or hydronephrosis. Urinary bladder is unremarkable. Stomach/Bowel: Sigmoid diverticulosis. No surrounding  inflammation to suggest active diverticulitis. No bowel obstruction. Stomach and small bowel decompressed, grossly unremarkable. Vascular/Lymphatic: No evidence of aneurysm or adenopathy. Reproductive: Prior hysterectomy.  No adnexal masses. Other: No free fluid or free air. Musculoskeletal: No acute bony abnormality. IMPRESSION: Sigmoid diverticulosis.  No changes of active diverticulitis. Electronically Signed: By: Rolm Baptise M.D. On: 11/27/2020 21:34     Assessment & Plan:  Plan    No orders of the defined types were placed in this encounter.   Problem List Items Addressed This Visit     Paroxysmal A-fib ([place]) - Primary   Hyperlipidemia, mixed    Tolerating statin, encouraged heart healthy diet, avoid trans fats, minimize simple carbs and saturated fats. Increase exercise as tolerated      Relevant Orders   Lipid panel   Depression with anxiety    Patient acknowledges she has been very anxious and stressed with her joint infection but for now she feels she is managing and does not want to start counseling or meds. She will let us know if she changes her mind      Hypothyroidism    On Levothyroxine, continue to monitor      Relevant Orders   TSH   Comprehensive metabolic panel   Arthritis, septic, knee ([place])    After knee replacement this past year. She had to have a debridement, PICC line with antibiotics. She is now on daily Doxycycline will continue. Voltaren gel is helping and can add Lidocaine gel to it. Is using a stationary bike, walking and stretching      Hyperglycemia    hgba1c acceptable, minimize simple carbs. Increase exercise as tolerated.       Relevant Orders   Hemoglobin A1c   RESOLVED: Status post total right knee replacement   Relevant Orders   CBC with Differential/Platelet    Follow-up: Return for f/u visit in 3 months (around 06/27/2021), then cpe in 6 months (around 09/27/2021).  Irish Elders, acting as a scribe for Penni Homans, MD, have  documented all relevent documentation on behalf of Penni Homans, MD, as directed by Penni Homans,  MD while in the presence of Penni Homans, MD. DO:03/29/21. I, Mosie Lukes, MD personally performed the services described in this documentation. All medical record entries made by the scribe were at my direction and in my presence. I have reviewed the chart and agree that the record reflects my personal performance and is accurate and complete

## 2021-03-30 LAB — LIPID PANEL
Cholesterol: 189 mg/dL (ref 0–200)
HDL: 69 mg/dL (ref >39.00)
LDL Cholesterol: 110 mg/dL — ABNORMAL HIGH (ref 0–99)
NonHDL: 120.3
Total CHOL/HDL Ratio: 3
Triglycerides: 52 mg/dL (ref 0.0–149.0)
VLDL: 10.4 mg/dL (ref 0.0–40.0)

## 2021-03-30 LAB — CBC WITH DIFFERENTIAL/PLATELET
Basophils Absolute: 0 10*3/uL (ref 0.0–0.1)
Basophils Relative: 1.1 % (ref 0.0–3.0)
Eosinophils Absolute: 0.1 10*3/uL (ref 0.0–0.7)
Eosinophils Relative: 2.1 % (ref 0.0–5.0)
HCT: 36.8 % (ref 36.0–46.0)
Hemoglobin: 12.2 g/dL (ref 12.0–15.0)
Lymphocytes Relative: 33.1 % (ref 12.0–46.0)
Lymphs Abs: 1.5 10*3/uL (ref 0.7–4.0)
MCHC: 33.1 g/dL (ref 30.0–36.0)
MCV: 85.7 fl (ref 78.0–100.0)
Monocytes Absolute: 0.4 10*3/uL (ref 0.1–1.0)
Monocytes Relative: 8.4 % (ref 3.0–12.0)
Neutro Abs: 2.4 10*3/uL (ref 1.4–7.7)
Neutrophils Relative %: 55.3 % (ref 43.0–77.0)
Platelets: 253 10*3/uL (ref 150.0–400.0)
RBC: 4.3 Mil/uL (ref 3.87–5.11)
RDW: 13.1 % (ref 11.5–15.5)
WBC: 4.4 10*3/uL (ref 4.0–10.5)

## 2021-03-30 LAB — COMPREHENSIVE METABOLIC PANEL
ALT: 86 U/L — ABNORMAL HIGH (ref 0–35)
AST: 63 U/L — ABNORMAL HIGH (ref 0–37)
Albumin: 4.1 g/dL (ref 3.5–5.2)
Alkaline Phosphatase: 98 U/L (ref 39–117)
BUN: 14 mg/dL (ref 6–23)
CO2: 30 mEq/L (ref 19–32)
Calcium: 9.5 mg/dL (ref 8.4–10.5)
Chloride: 97 mEq/L (ref 96–112)
Creatinine, Ser: 0.61 mg/dL (ref 0.40–1.20)
GFR: 90.64 mL/min (ref >60.00)
Glucose, Bld: 82 mg/dL (ref 70–99)
Potassium: 4.5 mEq/L (ref 3.5–5.1)
Sodium: 134 mEq/L — ABNORMAL LOW (ref 135–145)
Total Bilirubin: 1.2 mg/dL (ref 0.2–1.2)
Total Protein: 6.5 g/dL (ref 6.0–8.3)

## 2021-03-30 LAB — HEMOGLOBIN A1C: Hgb A1c MFr Bld: 5.6 % (ref 4.6–6.5)

## 2021-03-30 LAB — TSH: TSH: 0.2 u[IU]/mL — ABNORMAL LOW (ref 0.35–5.50)

## 2021-04-02 ENCOUNTER — Encounter: Payer: Self-pay | Admitting: Family Medicine

## 2021-04-03 ENCOUNTER — Encounter: Payer: Self-pay | Admitting: Internal Medicine

## 2021-04-04 ENCOUNTER — Other Ambulatory Visit: Payer: Self-pay

## 2021-04-04 DIAGNOSIS — E782 Mixed hyperlipidemia: Secondary | ICD-10-CM

## 2021-04-04 DIAGNOSIS — E039 Hypothyroidism, unspecified: Secondary | ICD-10-CM

## 2021-04-04 DIAGNOSIS — R748 Abnormal levels of other serum enzymes: Secondary | ICD-10-CM

## 2021-04-04 NOTE — Telephone Encounter (Signed)
Spoke with and she is aware

## 2021-04-10 ENCOUNTER — Encounter: Payer: Self-pay | Admitting: Family Medicine

## 2021-04-10 ENCOUNTER — Other Ambulatory Visit: Payer: Self-pay | Admitting: Family Medicine

## 2021-04-10 MED ORDER — LEVOTHYROXINE SODIUM 88 MCG PO TABS: 88.0000 ug | ORAL_TABLET | Freq: Every day | ORAL | 1 refills | Status: DC

## 2021-04-13 ENCOUNTER — Telehealth: Payer: Self-pay | Admitting: Family Medicine

## 2021-04-13 NOTE — Telephone Encounter (Signed)
Left message for patient to call back and schedule Medicare Annual Wellness Visit (AWV) in office.   If not able to come in office, please offer to do virtually or by telephone.  Left office number and my jabber [phone].  Last AWV:11/06/2016  Please schedule at anytime with Nurse Health Advisor.

## 2021-04-17 ENCOUNTER — Other Ambulatory Visit (INDEPENDENT_AMBULATORY_CARE_PROVIDER_SITE_OTHER): Payer: Medicare Other

## 2021-04-17 DIAGNOSIS — R748 Abnormal levels of other serum enzymes: Secondary | ICD-10-CM

## 2021-04-17 DIAGNOSIS — E782 Mixed hyperlipidemia: Secondary | ICD-10-CM

## 2021-04-17 LAB — COMPREHENSIVE METABOLIC PANEL
ALT: 62 U/L — ABNORMAL HIGH (ref 0–35)
AST: 56 U/L — ABNORMAL HIGH (ref 0–37)
Albumin: 4 g/dL (ref 3.5–5.2)
Alkaline Phosphatase: 98 U/L (ref 39–117)
BUN: 20 mg/dL (ref 6–23)
CO2: 32 mEq/L (ref 19–32)
Calcium: 9.7 mg/dL (ref 8.4–10.5)
Chloride: 100 mEq/L (ref 96–112)
Creatinine, Ser: 0.66 mg/dL (ref 0.40–1.20)
GFR: 88.9 mL/min (ref >60.00)
Glucose, Bld: 88 mg/dL (ref 70–99)
Potassium: 4.4 mEq/L (ref 3.5–5.1)
Sodium: 136 mEq/L (ref 135–145)
Total Bilirubin: 1.2 mg/dL (ref 0.2–1.2)
Total Protein: 6.2 g/dL (ref 6.0–8.3)

## 2021-04-17 LAB — CBC WITH DIFFERENTIAL/PLATELET
Basophils Absolute: 0 10*3/uL (ref 0.0–0.1)
Basophils Relative: 1 % (ref 0.0–3.0)
Eosinophils Absolute: 0.1 10*3/uL (ref 0.0–0.7)
Eosinophils Relative: 3 % (ref 0.0–5.0)
HCT: 37.5 % (ref 36.0–46.0)
Hemoglobin: 12.4 g/dL (ref 12.0–15.0)
Lymphocytes Relative: 35.3 % (ref 12.0–46.0)
Lymphs Abs: 1.4 10*3/uL (ref 0.7–4.0)
MCHC: 33.1 g/dL (ref 30.0–36.0)
MCV: 85.7 fl (ref 78.0–100.0)
Monocytes Absolute: 0.3 10*3/uL (ref 0.1–1.0)
Monocytes Relative: 7.8 % (ref 3.0–12.0)
Neutro Abs: 2 10*3/uL (ref 1.4–7.7)
Neutrophils Relative %: 52.9 % (ref 43.0–77.0)
Platelets: 262 10*3/uL (ref 150.0–400.0)
RBC: 4.37 Mil/uL (ref 3.87–5.11)
RDW: 12.8 % (ref 11.5–15.5)
WBC: 3.9 10*3/uL — ABNORMAL LOW (ref 4.0–10.5)

## 2021-04-18 ENCOUNTER — Other Ambulatory Visit: Payer: Self-pay | Admitting: Pharmacist

## 2021-04-18 ENCOUNTER — Encounter: Payer: Self-pay | Admitting: Internal Medicine

## 2021-04-18 DIAGNOSIS — M009 Pyogenic arthritis, unspecified: Secondary | ICD-10-CM

## 2021-04-18 MED ORDER — DOXYCYCLINE HYCLATE 100 MG PO TABS: 100.0000 mg | ORAL_TABLET | Freq: Two times a day (BID) | ORAL | 3 refills | Status: DC

## 2021-04-19 DIAGNOSIS — H353130 Nonexudative age-related macular degeneration, bilateral, stage unspecified: Secondary | ICD-10-CM | POA: Diagnosis not present

## 2021-04-19 DIAGNOSIS — H2513 Age-related nuclear cataract, bilateral: Secondary | ICD-10-CM | POA: Diagnosis not present

## 2021-04-19 DIAGNOSIS — H43812 Vitreous degeneration, left eye: Secondary | ICD-10-CM | POA: Diagnosis not present

## 2021-05-07 NOTE — Progress Notes (Signed)
Cardiology Office Note:    Date:  05/08/2021   ID:  Crystal Russell, DOB [birth year], MRN [id number]  PCP:  Mosie Lukes, MD  [facility] Cardiologist: Rudean Haskell MD [place] Electrophysiologist:  None   CC: Aortic dilation  History of Present Illness:    Crystal Russell is a 71 y.o. female with a hx of PAF in [place], Mild Atrial Functional MR, Mild to moderate AI without LV dilation, PFO, possible distant DVT possible history of rheumatic and HLD who presents for evaluation 05/01/20.  In interim of this visit, patient heart monitor with no AF burden.  After last visit noted she can only tolerate 5 mg of atorvastatin without myalgias.  Seen 05/08/21.  Patient notes that she is doing [place] considering all she has been to .   Had knee revision, then infection, and when seeing ID had heart rate in the 180s. Has had some elevated heart rates without minimal activity or with a catch in her throat.  This feels different that her palpitations she felt in 2022.  She can   No chest pain or pressure .  No SOB/DOE and no PND/Orthopnea.  No weight gain or leg swelling.     Past Medical History:  Diagnosis Date   Abdominal wall hernia 08/04/2012   Atrial fibrillation ([place]) 2008   REPORTS SHE WAS AT [place], UPON WAKING I FELT MY HEART GOING BOOM BOOM BOOM BEATING OUT OF MY CHEST. I WENT TO HOSPITAL AND THEY ISED MEDICINE TO CONVERT ME , IT TOOK OVER 10 HOURS TO CONVERT ME. BUT AFTER THAT IVE NEVER HAD ANY ISSUES SINCE. I USED TO HAVE A CARDIOLOGUIST AS MY PRIMARY IN [place] BUT IHAVENT BEEN TO A CARDIOLOGIST SINCE I MOVED DOWN HERE.    Chicken pox as a child   Depression    Depression with anxiety 12/27/2012   Diverticulitis    DJD (degenerative joint disease) 08/05/2012   DVT (deep venous thrombosis) ([place]) 2018   Fifth disease 1989   DX AFTER EXPOSURE FROM HER CHILDREN; DEVELOPED RHEUMATOID ARTHRITIS DURING COURSE OF ILLNESS. REPORTS:  " I DONT HAVE IT ANYMORE SINCE THE DISEASE IS GONE"     H/O gestational diabetes mellitus, not currently pregnant 08/05/2012   History of    Heart murmur    echo 2012; "I HAVE A FUNCTIONAL MURMUR"    Hyperlipidemia    Hypothyroidism    Knee effusion, right    while on Eliquis, discontinued Eliquis after DVT resolved   Measles as a child   Migraines    Other and unspecified hyperlipidemia 08/05/2012   Paroxysmal A-fib ([place])    Shingles 43   Spondylisthesis 2012   L5/S1 grade 3   Varicose veins 08/05/2012   Extending up to right groin B/l LE   Ventral hernia 08/05/2012   Wears glasses     Past Surgical History:  Procedure Laterality Date   BREAST DUCTAL SYSTEM EXCISION Right 05/05/2014   Procedure: RIGHT BREAST DUCT EXCISION;  Surgeon: Rolm Bookbinder, MD;  Location: [facility];  Service: General;  Laterality: Right;   COLONOSCOPY     DILATION AND CURETTAGE OF UTERUS     x4   EXCISIONAL TOTAL KNEE ARTHROPLASTY WITH ANTIBIOTIC SPACERS Right 09/14/2020   Procedure: Irrigation and debriement with poly exchange versus resection of right total knee arthroplasty and placement of antibitiotic spacer;  Surgeon: Paralee Cancel, MD;  Location: [facility];  Service: Orthopedics;  Laterality: Right;   MOUTH SURGERY  TOTAL KNEE ARTHROPLASTY Right 07/01/2017   Procedure: RIGHT TOTAL KNEE ARTHROPLASTY;  Surgeon: Paralee Cancel, MD;  Location: [facility];  Service: Orthopedics;  Laterality: Right;  70 mins   VARICOSE VEIN SURGERY  1995   right leg    Current Medications: Current Meds  Medication Sig   acetaminophen (TYLENOL) 500 MG tablet Take 1,000 mg by mouth as needed.   aspirin EC 81 MG tablet Take 81 mg by mouth daily. Swallow whole.   atorvastatin (LIPITOR) 10 MG tablet Take 0.5 tablets (5 mg total) by mouth daily.   Cholecalciferol (PA VITAMIN D-3) 50 MCG (2000 UT) CAPS Take 4,000 Units by mouth every evening.   Coenzyme Q10 300 MG CAPS Take 300 mg by mouth every evening.   diltiazem (CARDIZEM) 30 MG tablet Take 1 tablet (30 mg total)  by mouth every 6 (six) hours as needed (palpitations).   doxycycline (VIBRA-TABS) 100 MG tablet Take 1 tablet (100 mg total) by mouth 2 (two) times daily.   levothyroxine (SYNTHROID) 88 MCG tablet Take 1 tablet (88 mcg total) by mouth daily.   polyethylene glycol (MIRALAX / GLYCOLAX) 17 g packet Take 17 g by mouth 2 (two) times daily.   vitamin B-12 (CYANOCOBALAMIN) 100 MCG tablet daily.     Allergies:   Ciprofloxacin, Crestor [rosuvastatin], Pravachol [pravastatin], Cefazolin, and Rifampin   Social History   Socioeconomic History   Marital status: Married    Spouse name: Not on file   Number of children: Not on file   Years of education: Not on file   Highest education level: Not on file  Occupational History   Not on file  Tobacco Use   Smoking status: Former    Years: 4.00    Types: Cigarettes   Smokeless tobacco: Never  Vaping Use   Vaping Use: Never used  Substance and Sexual Activity   Alcohol use: No   Drug use: No   Sexual activity: Yes  Other Topics Concern   Not on file  Social History Narrative   Not on file   Social Determinants of Health   Financial Resource Strain: Not on file  Food Insecurity: Not on file  Transportation Needs: Not on file  Physical Activity: Not on file  Stress: Not on file  Social Connections: Not on file    SOCIAL: Son has UC; one son [name] (born on earth day) was very sick with [place], one son with two kids.  Doesn't do well with percentages in discussion  Family History: The patient's family history includes Asthma in her son and son; Cancer in her father and mother; Dementia in her maternal grandmother; Heart attack in her maternal grandfather and paternal uncle; Liver disease in her mother; Pancreatic cancer in an other family member; Peripheral Artery Disease in her sister; Stomach cancer in an other family member; Ulcerative colitis in her son and son. There is no history of Colon cancer, Cystic fibrosis, or Esophageal  cancer. History of coronary artery disease notable for grandfather. History of heart failure notable for no members. History of arrhythmia notable for no members. No history of bicuspid aortic valve or aortic aneurysm or dissection.  ROS:   Please see the history of present illness.    All other systems reviewed and are negative.  EKGs/Labs/Other Studies Reviewed:    The following studies were reviewed today:  EKG:   05/01/2020: sinus Bradycardia rate 55 WNL 06/24/2017: Sinus Bradycardia Rate 51  Cardiac Event Monitoring: Date: 06/07/20 Results: Patient had a minimum  heart rate of 39 bpm, maximum heart rate of 180 bpm, and average heart rate of 53 bpm. Predominant underlying rhythm was sinus rhythm. Two runs of nonsustained ventricular tachycardia occurred lasting 6 beats at longest with a max rate of 180 bpm at fastest. Isolated PACs were rare (<1.0%). Isolated PVCs were rare (<1.0%). No evidence of atrial fibrillation. Triggered and diary events associated with sinus rhythm, sinus bradycardia, and PACs.   Rare, symptomatic PACs.   Transthoracic Echocardiogram: Date: 03/14/21 Results: Personally read and reported  1. Left ventricular ejection fraction, by estimation, is 55 to 60%. The  left ventricle has normal function. The left ventricle has no regional  wall motion abnormalities. Left ventricular diastolic parameters are  indeterminate.   2. Right ventricular systolic function is normal. The right ventricular  size is normal. Mildly increased right ventricular wall thickness. There  is normal pulmonary artery systolic pressure.   3. Left atrial size was moderate to severely dilated.   4. The mitral valve is grossly normal. Mild mitral valve regurgitation.   5. The aortic valve is tricuspid. Aortic valve regurgitation is mild to  moderate. No aortic stenosis is present.   6. The inferior vena cava is dilated in size with >50% respiratory  variability, suggesting right  atrial pressure of 8 mmHg.  Report from 2021 noted mild AI and MR, small PFO with L-R shunt  Normal Carotid Artery Duplex- per patient  Recent Labs: 03/29/2021: TSH 0.20 04/17/2021: ALT 62; BUN 20; Creatinine, Ser 0.66; Hemoglobin 12.4; Platelets 262.0; Potassium 4.4; Sodium 136  Recent Lipid Panel    Component Value Date/Time   CHOL 189 03/29/2021 1442   TRIG 52.0 03/29/2021 1442   HDL 69.00 03/29/2021 1442   CHOLHDL 3 03/29/2021 1442   VLDL 10.4 03/29/2021 1442   LDLCALC 110 (H) 03/29/2021 1442      Physical Exam:    VS:  BP 100/60    Pulse (!) 53    Ht 5\' 4"  (1.626 m)    Wt 60.3 kg    SpO2 98%    BMI 22.83 kg/m     Wt Readings from Last 3 Encounters:  05/08/21 60.3 kg  01/05/21 59 kg  11/27/20 55.3 kg   Gen: no distress,   Neck: No JVD Cardiac: No Rubs or Gallops, Systolic and diastolic murmurs Murmur, regular bradycardia, +2 radial pulses Respiratory: Clear to auscultation bilaterally, normal effort, normal  respiratory rate GI: Soft, nontender, non-distended  MS: No  edema;  moves all extremities Integument: Skin feels warm Neuro:  At time of evaluation, alert and oriented to person/place/time/situation  Psych: Normal affect   ASSESSMENT:    1. PSVT (paroxysmal supraventricular tachycardia) ([place])   2. Nonrheumatic aortic valve insufficiency   3. Aortic atherosclerosis (HCC)    PLAN:    Paroxysmal Atrial Fibrillation (distantly in [place] only not on [facility]) P=SVT PAC Hypothyroidism on synthroid with changes in dosing based in the setting of her knee infection and rifampin - Risk factors include gender and female - CHADSVASC=2. - TSH 0.2 now getting dose adjustment - patient is waiting to see how her follow up labs go in summer - adding 30 mg PO dilitizem PRN palpitations - based on results, may repeat heart monitor and do lexi scan fo r1c agent  Mild to Moderate Aortic Insufficiency Mid Mitral Regurgitation PFO - asymptomatic - Echo in ~ one year  03/2022  Hyperlipidemia (mixed) Aortic atherosclerosis - reviewed CTPE from 7/22 with patient and family  -LDL goal less  than 70 but did not tolerated statin greater than 5mg   - patient has chosen to take CoQ 110 - at next visit will discuss Zetia vs PCSK9i  Six months  Time Spent Directly with Patient:   I have spent a total of 40 minutes with the patient reviewing notes, imaging, EKGs, labs and examining the patient as well as establishing an assessment and plan that was discussed personally with the patient.  > 50% of time was spent in direct patient care and family and reviewing imaging with patient.    Medication Adjustments/Labs and Tests Ordered: Current medicines are reviewed at length with the patient today.  Concerns regarding medicines are outlined above.  No orders of the defined types were placed in this encounter.  Meds ordered this encounter  Medications   diltiazem (CARDIZEM) 30 MG tablet    Sig: Take 1 tablet (30 mg total) by mouth every 6 (six) hours as needed (palpitations).    Dispense:  60 tablet    Refill:  3    Patient Instructions  Medication Instructions:  Your physician has recommended you make the following change in your medication:  START: diltiazem (Cardizem) 30 mg by mouth every 6 hours as needed for palpitations *If you need a refill on your cardiac medications before your next appointment, please call your pharmacy*   Lab Work: NONE If you have labs (blood work) drawn today and your tests are completely normal, you will receive your results only by: [place] (if you have MyChart) OR A paper copy in the mail If you have any lab test that is abnormal or we need to change your treatment, we will call you to review the results.   Testing/Procedures: NONE   Follow-Up: At [facility], you and your health needs are our priority.  As part of our continuing mission to provide you with exceptional heart care, we have created designated  Provider Care Teams.  These Care Teams include your primary Cardiologist (physician) and Advanced Practice Providers (APPs -  Physician Assistants and Nurse Practitioners) who all work together to provide you with the care you need, when you need it.   Your next appointment:   6 month(s)  The format for your next appointment:   In Person  Provider:   Werner Lean, MD        Signed, Werner Lean, MD  05/08/2021 10:21 AM    [place]

## 2021-05-08 ENCOUNTER — Ambulatory Visit (INDEPENDENT_AMBULATORY_CARE_PROVIDER_SITE_OTHER): Payer: Medicare Other | Admitting: Internal Medicine

## 2021-05-08 ENCOUNTER — Encounter: Payer: Self-pay | Admitting: Internal Medicine

## 2021-05-08 ENCOUNTER — Other Ambulatory Visit: Payer: Self-pay

## 2021-05-08 VITALS — BP 100/60 | HR 53 | Ht 64.0 in | Wt 133.0 lb

## 2021-05-08 DIAGNOSIS — I471 Supraventricular tachycardia: Secondary | ICD-10-CM

## 2021-05-08 DIAGNOSIS — I351 Nonrheumatic aortic (valve) insufficiency: Secondary | ICD-10-CM | POA: Diagnosis not present

## 2021-05-08 DIAGNOSIS — I700 Atherosclerosis of aorta: Secondary | ICD-10-CM | POA: Diagnosis not present

## 2021-05-08 MED ORDER — DILTIAZEM HCL 30 MG PO TABS: 30.0000 mg | ORAL_TABLET | Freq: Four times a day (QID) | ORAL | 3 refills | Status: DC | PRN

## 2021-05-08 NOTE — Patient Instructions (Signed)
Medication Instructions:  Your physician has recommended you make the following change in your medication:  START: diltiazem (Cardizem) 30 mg by mouth every 6 hours as needed for palpitations *If you need a refill on your cardiac medications before your next appointment, please call your pharmacy*   Lab Work: NONE If you have labs (blood work) drawn today and your tests are completely normal, you will receive your results only by: [place] (if you have MyChart) OR A paper copy in the mail If you have any lab test that is abnormal or we need to change your treatment, we will call you to review the results.   Testing/Procedures: NONE   Follow-Up: At [facility], you and your health needs are our priority.  As part of our continuing mission to provide you with exceptional heart care, we have created designated Provider Care Teams.  These Care Teams include your primary Cardiologist (physician) and Advanced Practice Providers (APPs -  Physician Assistants and Nurse Practitioners) who all work together to provide you with the care you need, when you need it.   Your next appointment:   6 month(s)  The format for your next appointment:   In Person  Provider:   Werner Lean, MD

## 2021-05-10 ENCOUNTER — Encounter: Payer: Self-pay | Admitting: Internal Medicine

## 2021-05-14 DIAGNOSIS — Z20822 Contact with and (suspected) exposure to covid-19: Secondary | ICD-10-CM | POA: Diagnosis not present

## 2021-05-15 ENCOUNTER — Telehealth: Payer: Self-pay | Admitting: Family Medicine

## 2021-05-15 NOTE — Telephone Encounter (Signed)
Left message for patient to call back and schedule Medicare Annual Wellness Visit (AWV) in office.  ° °If not able to come in office, please offer to do virtually or by telephone.  Left office number and my jabber #[phone]. ° °Due for AWVI ° °Please schedule at anytime with Nurse Health Advisor. °  °

## 2021-05-31 ENCOUNTER — Encounter: Payer: Self-pay | Admitting: Internal Medicine

## 2021-05-31 DIAGNOSIS — H52223 Regular astigmatism, bilateral: Secondary | ICD-10-CM | POA: Diagnosis not present

## 2021-05-31 DIAGNOSIS — H2513 Age-related nuclear cataract, bilateral: Secondary | ICD-10-CM | POA: Diagnosis not present

## 2021-05-31 DIAGNOSIS — H524 Presbyopia: Secondary | ICD-10-CM | POA: Diagnosis not present

## 2021-05-31 DIAGNOSIS — H43812 Vitreous degeneration, left eye: Secondary | ICD-10-CM | POA: Diagnosis not present

## 2021-05-31 DIAGNOSIS — H5203 Hypermetropia, bilateral: Secondary | ICD-10-CM | POA: Diagnosis not present

## 2021-05-31 DIAGNOSIS — H353130 Nonexudative age-related macular degeneration, bilateral, stage unspecified: Secondary | ICD-10-CM | POA: Diagnosis not present

## 2021-06-28 DIAGNOSIS — I781 Nevus, non-neoplastic: Secondary | ICD-10-CM | POA: Diagnosis not present

## 2021-06-28 DIAGNOSIS — L719 Rosacea, unspecified: Secondary | ICD-10-CM | POA: Diagnosis not present

## 2021-06-28 DIAGNOSIS — L821 Other seborrheic keratosis: Secondary | ICD-10-CM | POA: Diagnosis not present

## 2021-06-28 DIAGNOSIS — Z23 Encounter for immunization: Secondary | ICD-10-CM | POA: Diagnosis not present

## 2021-07-02 ENCOUNTER — Ambulatory Visit (INDEPENDENT_AMBULATORY_CARE_PROVIDER_SITE_OTHER): Payer: Medicare Other | Admitting: Internal Medicine

## 2021-07-02 ENCOUNTER — Other Ambulatory Visit: Payer: Self-pay

## 2021-07-02 ENCOUNTER — Encounter: Payer: Self-pay | Admitting: Internal Medicine

## 2021-07-02 VITALS — BP 134/75 | HR 50 | Temp 98.5°F | Ht 64.0 in | Wt 135.0 lb

## 2021-07-02 DIAGNOSIS — Z87898 Personal history of other specified conditions: Secondary | ICD-10-CM

## 2021-07-02 DIAGNOSIS — M009 Pyogenic arthritis, unspecified: Secondary | ICD-10-CM | POA: Diagnosis not present

## 2021-07-02 NOTE — Progress Notes (Signed)
? ? ?Patient ID: Crystal Russell, female   DOB: 08/08/1950, 71 y.o.   MRN: [id number] ? ?HPI ?71 yo F who had right knee pji s/p 1 staged revision with polyethylene revision/open excisional and non-excisional debridement of right knee by dr Alvan Dame on June 10th, 2022. In terms of micro data-  ?Has staph species on synovasure but strep mutans on cx results. Documents will be placed in media. She finished 6 wk of iv abtx and now has been tolerating doxycycline. She had rash and intolerance to cephalosporins. She is at the 9 month mark since her surgery. ? ?Outpatient Encounter Medications as of 07/02/2021  ?Medication Sig  ? acetaminophen (TYLENOL) 500 MG tablet Take 1,000 mg by mouth as needed.  ? aspirin EC 81 MG tablet Take 81 mg by mouth daily. Swallow whole.  ? atorvastatin (LIPITOR) 10 MG tablet Take 0.5 tablets (5 mg total) by mouth daily.  ? Cholecalciferol (PA VITAMIN D-3) 50 MCG (2000 UT) CAPS Take 4,000 Units by mouth every evening.  ? Coenzyme Q10 300 MG CAPS Take 300 mg by mouth every evening.  ? diltiazem (CARDIZEM) 30 MG tablet Take 1 tablet (30 mg total) by mouth every 6 (six) hours as needed (palpitations).  ? doxycycline (VIBRA-TABS) 100 MG tablet Take 1 tablet (100 mg total) by mouth 2 (two) times daily.  ? levothyroxine (SYNTHROID) 88 MCG tablet Take 1 tablet (88 mcg total) by mouth daily.  ? polyethylene glycol (MIRALAX / GLYCOLAX) 17 g packet Take 17 g by mouth 2 (two) times daily.  ? vitamin B-12 (CYANOCOBALAMIN) 100 MCG tablet daily.  ? ?No facility-administered encounter medications on file as of 07/02/2021.  ?  ? ?Patient Active Problem List  ? Diagnosis Date Noted  ? PSVT (paroxysmal supraventricular tachycardia) ([place]) 05/08/2021  ? Aortic atherosclerosis ([place]) 05/08/2021  ? Infection of prosthetic right knee joint ([place]) 09/14/2020  ? PAC (premature atrial contraction) 07/28/2020  ? Nonrheumatic aortic valve insufficiency 05/01/2020  ? Mild mitral regurgitation 05/01/2020  ? PFO (patent foramen  ovale) 05/01/2020  ? Diverticulitis 03/16/2020  ? Hyperglycemia 03/16/2020  ? Vertigo 09/14/2019  ? Arthritis, septic, knee ([place]) 06/16/2017  ? Postmenopausal bleeding 12/06/2015  ? Varicose veins of bilateral lower extremities with other complications 98/92/1194  ? Sun-damaged skin 12/04/2014  ? Breast mass, left 05/16/2014  ? Varicose veins of lower extremities with other complications 17/40/8144  ? Preventative health care 08/08/2013  ? Hypothyroidism 08/08/2013  ? Atrophic vaginitis 08/08/2013  ? Cervical cancer screening 08/05/2013  ? ETD (eustachian tube dysfunction) 05/26/2013  ? Depression with anxiety 12/27/2012  ? Ventral hernia 08/05/2012  ? Varicose veins 08/05/2012  ? DJD (degenerative joint disease) 08/05/2012  ? Constipation 08/05/2012  ? Hyperlipidemia, mixed 08/05/2012  ? H/O gestational diabetes mellitus, not currently pregnant 08/05/2012  ? Chicken pox   ? Shingles   ? Paroxysmal A-fib ([place])   ? Heart murmur   ? Spondylisthesis   ? ? ? ?Health Maintenance Due  ?Topic Date Due  ? URINE MICROALBUMIN  Never done  ? Zoster Vaccines- Shingrix (1 of 2) Never done  ? COLONOSCOPY (Pts 45-70yr Insurance coverage will need to be confirmed)  07/15/2018  ?  ? ?Review of Systems ?12 point ros is negative except what is mentioned above, occ palpitations with exercise, fatigue. ?Physical Exam  ? ?BP 134/75   Pulse (!) 50   Temp 98.5 ?F (36.9 ?C) (Oral)   Ht '5\' 4"'$  (1.626 m)   Wt 135 lb (61.2 kg) Comment: Stated.  Pt states she weighed self this am.  SpO2 100%   BMI 23.17 kg/m?  ?Physical Exam  ?Constitutional:  oriented to person, place, and time. appears well-developed and well-nourished. No distress.  ?HENT: [place]/AT, PERRLA, no scleral icterus ?Ext: trace pedal edema ?Skin: Skin is warm and dry. No rash noted. No erythema.  ?Psychiatric: a normal mood and affect.  behavior is normal.  ? ?CBC ?Lab Results  ?Component Value Date  ? WBC 3.9 (L) 04/17/2021  ? RBC 4.37 04/17/2021  ? HGB 12.4 04/17/2021  ? HCT  37.5 04/17/2021  ? PLT 262.0 04/17/2021  ? MCV 85.7 04/17/2021  ? MCH 28.5 03/20/2021  ? MCHC 33.1 04/17/2021  ? RDW 12.8 04/17/2021  ? LYMPHSABS 1.4 04/17/2021  ? MONOABS 0.3 04/17/2021  ? EOSABS 0.1 04/17/2021  ? ? ?BMET ?Lab Results  ?Component Value Date  ? NA 136 04/17/2021  ? K 4.4 04/17/2021  ? CL 100 04/17/2021  ? CO2 32 04/17/2021  ? GLUCOSE 88 04/17/2021  ? BUN 20 04/17/2021  ? CREATININE 0.66 04/17/2021  ? CALCIUM 9.7 04/17/2021  ? GFRNONAA >60 11/27/2020  ? GFRAA >60 09/19/2019  ? ? ? ? ?Assessment and Plan ?Hx of staph species/strep mutans = currently on doxycycline at the 9.5 month mark since her surgery. She is still feels that it helps to be on doxycycline to prevent relapse of infection. We discussed at detail that her surgery was early in the course of infection but she is anxious that it may re-occur within 12 month of last surgery and has being deconditioned. Discussed that can take abtx til ?Mid June and reassess at that time ? ?Discussed that she needs sunscreen while taking doxy ? ?Palpitations = has been evaluated by cardiology and suspect it is related to deconditioning. ? ?Will see back in mid June ? ?Spent 35 min with patient, greater than 50% in counseling on treatment plan ? ? ?

## 2021-07-03 ENCOUNTER — Other Ambulatory Visit (INDEPENDENT_AMBULATORY_CARE_PROVIDER_SITE_OTHER): Payer: Medicare Other

## 2021-07-03 DIAGNOSIS — E782 Mixed hyperlipidemia: Secondary | ICD-10-CM | POA: Diagnosis not present

## 2021-07-03 DIAGNOSIS — E039 Hypothyroidism, unspecified: Secondary | ICD-10-CM | POA: Diagnosis not present

## 2021-07-03 DIAGNOSIS — R748 Abnormal levels of other serum enzymes: Secondary | ICD-10-CM

## 2021-07-03 LAB — COMPREHENSIVE METABOLIC PANEL
ALT: 37 U/L — ABNORMAL HIGH (ref 0–35)
AST: 35 U/L (ref 0–37)
Albumin: 4 g/dL (ref 3.5–5.2)
Alkaline Phosphatase: 91 U/L (ref 39–117)
BUN: 12 mg/dL (ref 6–23)
CO2: 30 mEq/L (ref 19–32)
Calcium: 9.3 mg/dL (ref 8.4–10.5)
Chloride: 97 mEq/L (ref 96–112)
Creatinine, Ser: 0.65 mg/dL (ref 0.40–1.20)
GFR: 89.1 mL/min (ref >60.00)
Glucose, Bld: 73 mg/dL (ref 70–99)
Potassium: 4 mEq/L (ref 3.5–5.1)
Sodium: 134 mEq/L — ABNORMAL LOW (ref 135–145)
Total Bilirubin: 1.1 mg/dL (ref 0.2–1.2)
Total Protein: 6 g/dL (ref 6.0–8.3)

## 2021-07-03 LAB — HEPATIC FUNCTION PANEL
ALT: 37 U/L — ABNORMAL HIGH (ref 0–35)
AST: 35 U/L (ref 0–37)
Albumin: 4 g/dL (ref 3.5–5.2)
Alkaline Phosphatase: 91 U/L (ref 39–117)
Bilirubin, Direct: 0.2 mg/dL (ref 0.0–0.3)
Total Bilirubin: 1.1 mg/dL (ref 0.2–1.2)
Total Protein: 6 g/dL (ref 6.0–8.3)

## 2021-07-03 LAB — TSH: TSH: 0.47 u[IU]/mL (ref 0.35–5.50)

## 2021-07-10 DIAGNOSIS — H353132 Nonexudative age-related macular degeneration, bilateral, intermediate dry stage: Secondary | ICD-10-CM | POA: Diagnosis not present

## 2021-07-10 DIAGNOSIS — H43812 Vitreous degeneration, left eye: Secondary | ICD-10-CM | POA: Diagnosis not present

## 2021-07-10 DIAGNOSIS — H2513 Age-related nuclear cataract, bilateral: Secondary | ICD-10-CM | POA: Diagnosis not present

## 2021-07-17 DIAGNOSIS — Z20822 Contact with and (suspected) exposure to covid-19: Secondary | ICD-10-CM | POA: Diagnosis not present

## 2021-07-30 ENCOUNTER — Ambulatory Visit: Payer: Medicare Other | Admitting: Family Medicine

## 2021-08-11 DIAGNOSIS — Z20822 Contact with and (suspected) exposure to covid-19: Secondary | ICD-10-CM | POA: Diagnosis not present

## 2021-09-10 ENCOUNTER — Other Ambulatory Visit: Payer: Self-pay | Admitting: Family Medicine

## 2021-09-10 DIAGNOSIS — Z1231 Encounter for screening mammogram for malignant neoplasm of breast: Secondary | ICD-10-CM

## 2021-09-18 ENCOUNTER — Ambulatory Visit
Admission: RE | Admit: 2021-09-18 | Discharge: 2021-09-18 | Disposition: A | Discharge status: Home / Self Care | Payer: Medicare Other | Source: Ambulatory Visit

## 2021-09-18 DIAGNOSIS — Z1231 Encounter for screening mammogram for malignant neoplasm of breast: Secondary | ICD-10-CM

## 2021-09-24 ENCOUNTER — Ambulatory Visit (INDEPENDENT_AMBULATORY_CARE_PROVIDER_SITE_OTHER): Payer: Medicare Other | Admitting: Internal Medicine

## 2021-09-24 ENCOUNTER — Other Ambulatory Visit: Payer: Self-pay

## 2021-09-24 VITALS — BP 112/73 | HR 59 | Resp 16 | Ht 64.0 in

## 2021-09-24 DIAGNOSIS — R682 Dry mouth, unspecified: Secondary | ICD-10-CM | POA: Diagnosis not present

## 2021-09-24 DIAGNOSIS — Z79899 Other long term (current) drug therapy: Secondary | ICD-10-CM | POA: Diagnosis not present

## 2021-09-24 DIAGNOSIS — R7401 Elevation of levels of liver transaminase levels: Secondary | ICD-10-CM

## 2021-09-24 DIAGNOSIS — M009 Pyogenic arthritis, unspecified: Secondary | ICD-10-CM | POA: Diagnosis not present

## 2021-09-24 MED ORDER — DOXYCYCLINE MONOHYDRATE 100 MG PO CAPS: 100.0000 mg | ORAL_CAPSULE | Freq: Two times a day (BID) | ORAL | 3 refills | Status: DC

## 2021-09-24 NOTE — Progress Notes (Unsigned)
RFV: follow up for pji  Patient ID: Crystal Russell, female   DOB: 1950-04-28, 71 y.o.   MRN: [id number]  HPI 71 yo F who had right knee pji s/p 1 staged revision with polyethylene revision/open excisional and non-excisional debridement of right knee by dr Alvan Dame on June 10th, 2022. In terms of micro data-  Has staph species on synovasure but strep mutans on cx results.  She finished 6 wk of iv abtx and  has been on chronic doxy .She had rash and intolerance to cephalosporins.   She had episode of sunburn to her nose but now realizes to use large brim hat and sunscreen. Still wants to continue to take doxy concern for recurrence of infection to left knee  Ros: noticing dry mouth, and right native knee stiffness. Still just over stiffness. No diarrhea.fever.chills  Lab Results  Component Value Date   ESRSEDRATE 9 03/20/2021     Outpatient Encounter Medications as of 09/24/2021  Medication Sig   acetaminophen (TYLENOL) 500 MG tablet Take 1,000 mg by mouth as needed.   aspirin EC 81 MG tablet Take 81 mg by mouth daily. Swallow whole.   atorvastatin (LIPITOR) 10 MG tablet Take 0.5 tablets (5 mg total) by mouth daily.   Cholecalciferol (PA VITAMIN D-3) 50 MCG (2000 UT) CAPS Take 4,000 Units by mouth every evening.   Coenzyme Q10 300 MG CAPS Take 300 mg by mouth every evening.   doxycycline (VIBRA-TABS) 100 MG tablet Take 1 tablet (100 mg total) by mouth 2 (two) times daily.   levothyroxine (SYNTHROID) 88 MCG tablet Take 1 tablet (88 mcg total) by mouth daily.   Multiple Vitamins-Minerals (ICAPS AREDS 2 PO) Take by mouth.   NON FORMULARY Apply   OVER THE COUNTER MEDICATION Take 1 tablet by mouth daily.   OVER THE COUNTER MEDICATION Take 1 tablet by mouth daily.   polyethylene glycol (MIRALAX / GLYCOLAX) 17 g packet Take 17 g by mouth 2 (two) times daily.   vitamin B-12 (CYANOCOBALAMIN) 100 MCG tablet daily.   diltiazem (CARDIZEM) 30 MG tablet Take 1 tablet (30 mg total) by mouth every 6  (six) hours as needed (palpitations). (Patient not taking: Reported on 07/02/2021)   No facility-administered encounter medications on file as of 09/24/2021.     Patient Active Problem List   Diagnosis Date Noted   PSVT (paroxysmal supraventricular tachycardia) ([place]) 05/08/2021   Aortic atherosclerosis ([place]) 05/08/2021   Infection of prosthetic right knee joint ([place]) 09/14/2020   PAC (premature atrial contraction) 07/28/2020   Nonrheumatic aortic valve insufficiency 05/01/2020   Mild mitral regurgitation 05/01/2020   PFO (patent foramen ovale) 05/01/2020   Diverticulitis 03/16/2020   Hyperglycemia 03/16/2020   Vertigo 09/14/2019   Arthritis, septic, knee ([place]) 06/16/2017   Postmenopausal bleeding 12/06/2015   Varicose veins of bilateral lower extremities with other complications 26/71/2458   Sun-damaged skin 12/04/2014   Breast mass, left 05/16/2014   Varicose veins of lower extremities with other complications 09/98/3382   Preventative health care 08/08/2013   Hypothyroidism 08/08/2013   Atrophic vaginitis 08/08/2013   Cervical cancer screening 08/05/2013   ETD (eustachian tube dysfunction) 05/26/2013   Depression with anxiety 12/27/2012   Ventral hernia 08/05/2012   Varicose veins 08/05/2012   DJD (degenerative joint disease) 08/05/2012   Constipation 08/05/2012   Hyperlipidemia, mixed 08/05/2012   H/O gestational diabetes mellitus, not currently pregnant 08/05/2012   Chicken pox    Shingles    Paroxysmal A-fib (HCC)    Heart murmur  Spondylisthesis      Health Maintenance Due  Topic Date Due   URINE MICROALBUMIN  Never done   Zoster Vaccines- Shingrix (1 of 2) Never done   COLONOSCOPY (Pts 45-51yr Insurance coverage will need to be confirmed)  07/15/2018   COVID-19 Vaccine (5 - Pfizer series) 05/07/2021     Review of Systems See above Physical Exam   BP 112/73   Pulse (!) 59   Resp 16   Ht '5\' 4"'$  (1.626 m)   SpO2 97%   BMI 23.17 kg/m   Gen - a x o by  71 HEENT: MMM, no oral thrush Ext : right knee incision + warmth, Skin = no rash CBC Lab Results  Component Value Date   WBC 3.9 (L) 04/17/2021   RBC 4.37 04/17/2021   HGB 12.4 04/17/2021   HCT 37.5 04/17/2021   PLT 262.0 04/17/2021   MCV 85.7 04/17/2021   MCH 28.5 03/20/2021   MCHC 33.1 04/17/2021   RDW 12.8 04/17/2021   LYMPHSABS 1.4 04/17/2021   MONOABS 0.3 04/17/2021   EOSABS 0.1 04/17/2021    BMET Lab Results  Component Value Date   NA 134 (L) 07/03/2021   K 4.0 07/03/2021   CL 97 07/03/2021   CO2 30 07/03/2021   GLUCOSE 73 07/03/2021   BUN 12 07/03/2021   CREATININE 0.65 07/03/2021   CALCIUM 9.3 07/03/2021   GFRNONAA >60 11/27/2020   GFRAA >60 09/19/2019    Lab Results  Component Value Date   ESRSEDRATE 6 09/24/2021     Assessment and Plan Long term medication management  and chronic pji = will check Cbc, bmp, and sed rate  Dry mouth = she sees dr bCharlett Blakenext month. Discuss if it maybe related to auto immune process ---- Addendum slightly elevated AST/ALT = will discuss to stopping any offenders and then recheck CMP

## 2021-09-25 LAB — CBC WITH DIFFERENTIAL/PLATELET
Absolute Monocytes: 407 cells/uL (ref 200–950)
Basophils Absolute: 38 cells/uL (ref 0–200)
Basophils Relative: 1 %
Eosinophils Absolute: 87 cells/uL (ref 15–500)
Eosinophils Relative: 2.3 %
HCT: 36.4 % (ref 35.0–45.0)
Hemoglobin: 12.3 g/dL (ref 11.7–15.5)
Lymphs Abs: 1440 cells/uL (ref 850–3900)
MCH: 29.5 pg (ref 27.0–33.0)
MCHC: 33.8 g/dL (ref 32.0–36.0)
MCV: 87.3 fL (ref 80.0–100.0)
MPV: 9.7 fL (ref 7.5–12.5)
Monocytes Relative: 10.7 %
Neutro Abs: 1828 cells/uL (ref 1500–7800)
Neutrophils Relative %: 48.1 %
Platelets: 235 10*3/uL (ref 140–400)
RBC: 4.17 10*6/uL (ref 3.80–5.10)
RDW: 12.2 % (ref 11.0–15.0)
Total Lymphocyte: 37.9 %
WBC: 3.8 10*3/uL (ref 3.8–10.8)

## 2021-09-25 LAB — COMPREHENSIVE METABOLIC PANEL WITH GFR
AG Ratio: 1.8 (calc) (ref 1.0–2.5)
ALT: 45 U/L — ABNORMAL HIGH (ref 6–29)
AST: 44 U/L — ABNORMAL HIGH (ref 10–35)
Albumin: 4 g/dL (ref 3.6–5.1)
Alkaline phosphatase (APISO): 95 U/L (ref 37–153)
BUN: 15 mg/dL (ref 7–25)
CO2: 29 mmol/L (ref 20–32)
Calcium: 9.6 mg/dL (ref 8.6–10.4)
Chloride: 101 mmol/L (ref 98–110)
Creat: 0.67 mg/dL (ref 0.60–1.00)
Globulin: 2.2 g/dL (ref 1.9–3.7)
Glucose, Bld: 76 mg/dL (ref 65–99)
Potassium: 4.5 mmol/L (ref 3.5–5.3)
Sodium: 136 mmol/L (ref 135–146)
Total Bilirubin: 0.9 mg/dL (ref 0.2–1.2)
Total Protein: 6.2 g/dL (ref 6.1–8.1)

## 2021-09-25 LAB — SEDIMENTATION RATE: Sed Rate: 6 mm/h (ref 0–30)

## 2021-09-26 ENCOUNTER — Encounter: Payer: Self-pay | Admitting: Infectious Diseases

## 2021-09-26 ENCOUNTER — Encounter: Payer: Self-pay | Admitting: Family Medicine

## 2021-09-26 DIAGNOSIS — R208 Other disturbances of skin sensation: Secondary | ICD-10-CM

## 2021-09-26 DIAGNOSIS — E039 Hypothyroidism, unspecified: Secondary | ICD-10-CM

## 2021-09-26 DIAGNOSIS — R682 Dry mouth, unspecified: Secondary | ICD-10-CM

## 2021-10-01 ENCOUNTER — Other Ambulatory Visit: Payer: Self-pay | Admitting: Family Medicine

## 2021-10-01 ENCOUNTER — Other Ambulatory Visit (INDEPENDENT_AMBULATORY_CARE_PROVIDER_SITE_OTHER): Payer: Medicare Other

## 2021-10-01 ENCOUNTER — Other Ambulatory Visit: Payer: Self-pay

## 2021-10-01 DIAGNOSIS — E039 Hypothyroidism, unspecified: Secondary | ICD-10-CM

## 2021-10-01 DIAGNOSIS — R208 Other disturbances of skin sensation: Secondary | ICD-10-CM | POA: Diagnosis not present

## 2021-10-01 DIAGNOSIS — R682 Dry mouth, unspecified: Secondary | ICD-10-CM | POA: Diagnosis not present

## 2021-10-01 LAB — TSH: TSH: 0.96 u[IU]/mL (ref 0.35–5.50)

## 2021-10-01 MED ORDER — LEVOTHYROXINE SODIUM 88 MCG PO TABS: 88.0000 ug | ORAL_TABLET | Freq: Every day | ORAL | 1 refills | Status: DC

## 2021-10-02 LAB — SJOGRENS SYNDROME-A EXTRACTABLE NUCLEAR ANTIBODY: SSA (Ro) (ENA) Antibody, IgG: <1 AI

## 2021-10-02 LAB — SJOGRENS SYNDROME-B EXTRACTABLE NUCLEAR ANTIBODY: SSB (La) (ENA) Antibody, IgG: <1 AI

## 2021-10-08 ENCOUNTER — Ambulatory Visit: Payer: Medicare Other | Admitting: Family Medicine

## 2021-10-10 NOTE — Progress Notes (Signed)
Subjective:    Patient ID: Crystal Russell, female    DOB: 1950-11-28, 71 y.o.   MRN: [id number]  Chief Complaint  Patient presents with   Annual Exam    HPI Patient is in today for follow up on chronic medical concerns. No recent febrile illness or acute hospitalizations. She is frustrated with dry mouth, her tongue is burning and she has swelling lesions inside her lower lip anteriorly. She continues to deal with a great deal of stress due to their 16 year old son's uncontrolled IBD. She maintains a heart healthy diet and to stay active. Denies CP/palp/SOB/HA/congestion/fevers/GI or GU c/o. Taking meds as prescribed   Past Medical History:  Diagnosis Date   Abdominal wall hernia 08/04/2012   Atrial fibrillation ([place]) 2008   REPORTS SHE WAS AT [place], UPON WAKING I FELT MY HEART GOING BOOM BOOM BOOM BEATING OUT OF MY CHEST. I WENT TO HOSPITAL AND THEY ISED MEDICINE TO CONVERT ME , IT TOOK OVER 10 HOURS TO CONVERT ME. BUT AFTER THAT IVE NEVER HAD ANY ISSUES SINCE. I USED TO HAVE A CARDIOLOGUIST AS MY PRIMARY IN [place] BUT IHAVENT BEEN TO A CARDIOLOGIST SINCE I MOVED DOWN HERE.    Chicken pox as a child   Depression    Depression with anxiety 12/27/2012   Diverticulitis    DJD (degenerative joint disease) 08/05/2012   DVT (deep venous thrombosis) ([place]) 2018   Fifth disease 1989   DX AFTER EXPOSURE FROM HER CHILDREN; DEVELOPED RHEUMATOID ARTHRITIS DURING COURSE OF ILLNESS. REPORTS:  " I DONT HAVE IT ANYMORE SINCE THE DISEASE IS GONE"    H/O gestational diabetes mellitus, not currently pregnant 08/05/2012   History of    Heart murmur    echo 2012; "I HAVE A FUNCTIONAL MURMUR"    Hyperlipidemia    Hypothyroidism    Knee effusion, right    while on Eliquis, discontinued Eliquis after DVT resolved   Measles as a child   Migraines    Other and unspecified hyperlipidemia 08/05/2012   Paroxysmal A-fib ([place])    Shingles 43   Spondylisthesis 2012   L5/S1 grade 3   Varicose veins 08/05/2012    Extending up to right groin B/l LE   Ventral hernia 08/05/2012   Wears glasses     Past Surgical History:  Procedure Laterality Date   BREAST DUCTAL SYSTEM EXCISION Right 05/05/2014   Procedure: RIGHT BREAST DUCT EXCISION;  Surgeon: Rolm Bookbinder, MD;  Location: [facility];  Service: General;  Laterality: Right;   COLONOSCOPY     DILATION AND CURETTAGE OF UTERUS     x4   EXCISIONAL TOTAL KNEE ARTHROPLASTY WITH ANTIBIOTIC SPACERS Right 09/14/2020   Procedure: Irrigation and debriement with poly exchange versus resection of right total knee arthroplasty and placement of antibitiotic spacer;  Surgeon: Paralee Cancel, MD;  Location: [facility];  Service: Orthopedics;  Laterality: Right;   MOUTH SURGERY     TOTAL KNEE ARTHROPLASTY Right 07/01/2017   Procedure: RIGHT TOTAL KNEE ARTHROPLASTY;  Surgeon: Paralee Cancel, MD;  Location: [facility];  Service: Orthopedics;  Laterality: Right;  70 mins   VARICOSE VEIN SURGERY  1995   right leg    Family History  Problem Relation Age of Onset   Cancer Mother        lung- smoker   Liver disease Mother    Cancer Father        lung- smoker   Peripheral Artery Disease Sister    Heart  attack Paternal Uncle    Dementia Maternal Grandmother    Heart attack Maternal Grandfather    Asthma Son    Ulcerative colitis Son    Asthma Son    Ulcerative colitis Son    Pancreatic cancer Other    Stomach cancer Other    Colon cancer Neg Hx    Cystic fibrosis Neg Hx    Esophageal cancer Neg Hx     Social History   Socioeconomic History   Marital status: Married    Spouse name: Not on file   Number of children: Not on file   Years of education: Not on file   Highest education level: Not on file  Occupational History   Not on file  Tobacco Use   Smoking status: Former    Years: 4.00    Types: Cigarettes   Smokeless tobacco: Never  Vaping Use   Vaping Use: Never used  Substance and Sexual Activity   Alcohol use: No   Drug use: No    Sexual activity: Yes  Other Topics Concern   Not on file  Social History Narrative   Not on file   Social Determinants of Health   Financial Resource Strain: Not on file  Food Insecurity: Not on file  Transportation Needs: Not on file  Physical Activity: Not on file  Stress: Not on file  Social Connections: Not on file  Intimate Partner Violence: Not on file    Outpatient Medications Prior to Visit  Medication Sig Dispense Refill   acetaminophen (TYLENOL) 500 MG tablet Take 1,000 mg by mouth as needed.     aspirin EC 81 MG tablet Take 81 mg by mouth daily. Swallow whole.     atorvastatin (LIPITOR) 10 MG tablet Take 0.5 tablets (5 mg total) by mouth daily. 45 tablet 3   Cholecalciferol (PA VITAMIN D-3) 50 MCG (2000 UT) CAPS Take 4,000 Units by mouth every evening.     Coenzyme Q10 300 MG CAPS Take 300 mg by mouth every evening.     diltiazem (CARDIZEM) 30 MG tablet Take 1 tablet (30 mg total) by mouth every 6 (six) hours as needed (palpitations). 60 tablet 3   doxycycline (MONODOX) 100 MG capsule Take 1 capsule (100 mg total) by mouth 2 (two) times daily. 180 capsule 3   levothyroxine (SYNTHROID) 88 MCG tablet TAKE ONE TABLET BY MOUTH ONE TIME DAILY 90 tablet 0   polyethylene glycol (MIRALAX / GLYCOLAX) 17 g packet Take 17 g by mouth 2 (two) times daily.     vitamin B-12 (CYANOCOBALAMIN) 100 MCG tablet daily.     Multiple Vitamins-Minerals (ICAPS AREDS 2 PO) Take by mouth.     NON FORMULARY Apply     OVER THE COUNTER MEDICATION Take 1 tablet by mouth daily.     OVER THE COUNTER MEDICATION Take 1 tablet by mouth daily.     No facility-administered medications prior to visit.    Allergies  Allergen Reactions   Ciprofloxacin Other (See Comments)    Abdominal pain    Crestor [Rosuvastatin] Other (See Comments)    Joint pain w/fibromyalgia   Pravachol [Pravastatin] Other (See Comments)    Joint pain w/fibromyalgia   Cefazolin Rash   Rifampin Rash    Review of Systems   Constitutional:  Negative for chills, fever and malaise/fatigue.  HENT:  Negative for congestion and hearing loss.   Eyes:  Negative for blurred vision and discharge.  Respiratory:  Negative for cough, sputum production and shortness of  breath.   Cardiovascular:  Negative for chest pain, palpitations and leg swelling.  Gastrointestinal:  Negative for abdominal pain, blood in stool, constipation, diarrhea, heartburn, nausea and vomiting.  Genitourinary:  Negative for dysuria, frequency, hematuria and urgency.  Musculoskeletal:  Negative for back pain, falls and myalgias.  Skin:  Negative for rash.  Neurological:  Negative for dizziness, sensory change, loss of consciousness, weakness and headaches.  Endo/Heme/Allergies:  Negative for environmental allergies. Does not bruise/bleed easily.  Psychiatric/Behavioral:  Negative for depression and suicidal ideas. The patient is not nervous/anxious and does not have insomnia.        Objective:    Physical Exam Constitutional:      General: She is not in acute distress.    Appearance: She is well-developed.  HENT:     Head: Normocephalic and atraumatic.     Mouth/Throat:     Mouth: Mucous membranes are dry.     Comments: Lesions noted along inner lower lip anteriorly. Raised irregular  Eyes:     Conjunctiva/sclera: Conjunctivae normal.  Neck:     Thyroid: No thyromegaly.  Cardiovascular:     Rate and Rhythm: Normal rate and regular rhythm.     Heart sounds: Normal heart sounds. No murmur heard. Pulmonary:     Effort: Pulmonary effort is normal. No respiratory distress.     Breath sounds: Normal breath sounds.  Abdominal:     General: Bowel sounds are normal. There is no distension.     Palpations: Abdomen is soft. There is no mass.     Tenderness: There is no abdominal tenderness.  Musculoskeletal:     Cervical back: Neck supple.  Lymphadenopathy:     Cervical: No cervical adenopathy.  Skin:    General: Skin is warm and dry.   Neurological:     Mental Status: She is alert and oriented to person, place, and time.  Psychiatric:        Behavior: Behavior normal.     BP 110/64 (BP Location: Right Arm, Patient Position: Sitting, Cuff Size: Normal)   Pulse (!) 52   Resp 20   Ht '5\' 4"'$  (1.626 m)   Wt 139 lb (63 kg)   SpO2 98%   BMI 23.86 kg/m  Wt Readings from Last 3 Encounters:  10/11/21 139 lb (63 kg)  07/02/21 135 lb (61.2 kg)  05/08/21 133 lb (60.3 kg)    Diabetic Foot Exam - Simple   No data filed    Lab Results  Component Value Date   WBC 3.8 09/24/2021   HGB 12.3 09/24/2021   HCT 36.4 09/24/2021   PLT 235 09/24/2021   GLUCOSE 76 09/24/2021   CHOL 189 03/29/2021   TRIG 52.0 03/29/2021   HDL 69.00 03/29/2021   LDLCALC 110 (H) 03/29/2021   ALT 45 (H) 09/24/2021   AST 44 (H) 09/24/2021   NA 136 09/24/2021   K 4.5 09/24/2021   CL 101 09/24/2021   CREATININE 0.67 09/24/2021   BUN 15 09/24/2021   CO2 29 09/24/2021   TSH 0.96 10/01/2021   HGBA1C 5.6 03/29/2021    Lab Results  Component Value Date   TSH 0.96 10/01/2021   Lab Results  Component Value Date   WBC 3.8 09/24/2021   HGB 12.3 09/24/2021   HCT 36.4 09/24/2021   MCV 87.3 09/24/2021   PLT 235 09/24/2021   Lab Results  Component Value Date   NA 136 09/24/2021   K 4.5 09/24/2021   CO2 29 09/24/2021  GLUCOSE 76 09/24/2021   BUN 15 09/24/2021   CREATININE 0.67 09/24/2021   BILITOT 0.9 09/24/2021   ALKPHOS 91 07/03/2021   ALKPHOS 91 07/03/2021   AST 44 (H) 09/24/2021   ALT 45 (H) 09/24/2021   PROT 6.2 09/24/2021   ALBUMIN 4.0 07/03/2021   ALBUMIN 4.0 07/03/2021   CALCIUM 9.6 09/24/2021   ANIONGAP 10 11/27/2020   GFR 89.10 07/03/2021   Lab Results  Component Value Date   CHOL 189 03/29/2021   Lab Results  Component Value Date   HDL 69.00 03/29/2021   Lab Results  Component Value Date   LDLCALC 110 (H) 03/29/2021   Lab Results  Component Value Date   TRIG 52.0 03/29/2021   Lab Results  Component  Value Date   CHOLHDL 3 03/29/2021   Lab Results  Component Value Date   HGBA1C 5.6 03/29/2021       Assessment & Plan:   COLONOSCOPY: 07/14/08 MAMMO: 09/18/21 PAP: 09/20/19 PSA: n/a DEXA: 09/20/19, osteopenic   Problem List Items Addressed This Visit     Hyperlipidemia, mixed    Tolerating statin, encouraged heart healthy diet, avoid trans fats, minimize simple carbs and saturated fats. Increase exercise as tolerated      Preventative health care - Primary    Patient encouraged to maintain heart healthy diet, regular exercise, adequate sleep. Consider daily probiotics. Take medications as prescribed. Labs ordered and reviewed. MM June 2023, repeat in 1-2 years. Pap due to be repeated in next year. Due for repeat colonoscopy.      Hypothyroidism    On Levothyroxine, continue to monitor      Arthritis, septic, knee (HCC)    Still on Doxycycline for her infection with Staph and Strep. For at least 6 more months, start a probiotics daily      Relevant Medications   fluconazole (DIFLUCAN) 150 MG tablet   Hyperglycemia    hgba1c acceptable, minimize simple carbs. Increase exercise as tolerated.        Dry mouth    Tongue is burning, consider a yeast infection due to the Doxycycline try a course of Diflucan. Encouraged to try Biotene gel and Spry products to manage. Referred to ENT for evaluation of dry mouth and oral lesions.       Relevant Orders   Ambulatory referral to ENT   Hyponatremia    Very mild, asymptomatic      Other Visit Diagnoses     Oral lesion       Relevant Orders   Ambulatory referral to ENT       I have discontinued Ivin Booty Granberry's OVER THE COUNTER MEDICATION, OVER THE COUNTER MEDICATION, NON FORMULARY, and Multiple Vitamins-Minerals (ICAPS AREDS 2 PO). I am also having her start on fluconazole. Additionally, I am having her maintain her aspirin EC, atorvastatin, Coenzyme Q10, acetaminophen, PA Vitamin D-3, polyethylene glycol, vitamin B-12,  diltiazem, doxycycline, and levothyroxine.  Meds ordered this encounter  Medications   fluconazole (DIFLUCAN) 150 MG tablet    Sig: Take 1 tablet (150 mg total) by mouth once a week.    Dispense:  2 tablet    Refill:  1

## 2021-10-11 ENCOUNTER — Ambulatory Visit (INDEPENDENT_AMBULATORY_CARE_PROVIDER_SITE_OTHER): Payer: Medicare Other | Admitting: Family Medicine

## 2021-10-11 ENCOUNTER — Encounter: Payer: Self-pay | Admitting: Family Medicine

## 2021-10-11 VITALS — BP 110/64 | HR 52 | Resp 20 | Ht 64.0 in | Wt 139.0 lb

## 2021-10-11 DIAGNOSIS — M009 Pyogenic arthritis, unspecified: Secondary | ICD-10-CM | POA: Diagnosis not present

## 2021-10-11 DIAGNOSIS — E871 Hypo-osmolality and hyponatremia: Secondary | ICD-10-CM

## 2021-10-11 DIAGNOSIS — R739 Hyperglycemia, unspecified: Secondary | ICD-10-CM | POA: Diagnosis not present

## 2021-10-11 DIAGNOSIS — K1370 Unspecified lesions of oral mucosa: Secondary | ICD-10-CM

## 2021-10-11 DIAGNOSIS — R682 Dry mouth, unspecified: Secondary | ICD-10-CM | POA: Insufficient documentation

## 2021-10-11 DIAGNOSIS — E039 Hypothyroidism, unspecified: Secondary | ICD-10-CM | POA: Diagnosis not present

## 2021-10-11 DIAGNOSIS — Z0000 Encounter for general adult medical examination without abnormal findings: Secondary | ICD-10-CM | POA: Diagnosis not present

## 2021-10-11 DIAGNOSIS — E782 Mixed hyperlipidemia: Secondary | ICD-10-CM | POA: Diagnosis not present

## 2021-10-11 MED ORDER — FLUCONAZOLE 150 MG PO TABS: 150.0000 mg | ORAL_TABLET | ORAL | 1 refills | Status: DC

## 2021-10-11 NOTE — Assessment & Plan Note (Signed)
On Levothyroxine, continue to monitor 

## 2021-10-11 NOTE — Assessment & Plan Note (Signed)
Tolerating statin, encouraged heart healthy diet, avoid trans fats, minimize simple carbs and saturated fats. Increase exercise as tolerated 

## 2021-10-11 NOTE — Assessment & Plan Note (Signed)
Still on Doxycycline for her infection with Staph and Strep. For at least 6 more months, start a probiotics daily

## 2021-10-11 NOTE — Assessment & Plan Note (Addendum)
Patient encouraged to maintain heart healthy diet, regular exercise, adequate sleep. Consider daily probiotics. Take medications as prescribed. Labs ordered and reviewed. MM June 2023, repeat in 1-2 years. Pap due to be repeated in next year. Due for repeat colonoscopy.

## 2021-10-11 NOTE — Assessment & Plan Note (Signed)
hgba1c acceptable, minimize simple carbs. Increase exercise as tolerated.  

## 2021-10-11 NOTE — Assessment & Plan Note (Signed)
Very mild, asymptomatic

## 2021-10-11 NOTE — Assessment & Plan Note (Addendum)
Tongue is burning, consider a yeast infection due to the Doxycycline try a course of Diflucan. Encouraged to try Biotene gel and Spry products to manage. Referred to ENT for evaluation of dry mouth and oral lesions.

## 2021-10-11 NOTE — Patient Instructions (Addendum)
Spry or Biotene products for dry mouth.   Sip on liquids all day  Shingrix is the new shingles shot, 2 shots over 2-6 months, confirm coverage with insurance and document, then can return here for shots with nurse appt or at [facility] 65 Years and Older, Female Preventive care refers to lifestyle choices and visits with your health care provider that can promote health and wellness. Preventive care visits are also called wellness exams. What can I expect for my preventive care visit? Counseling Your health care provider may ask you questions about your: Medical history, including: Past medical problems. Family medical history. Pregnancy and menstrual history. History of falls. Current health, including: Memory and ability to understand (cognition). Emotional well-being. Home life and relationship well-being. Sexual activity and sexual health. Lifestyle, including: Alcohol, nicotine or tobacco, and drug use. Access to firearms. Diet, exercise, and sleep habits. Work and work Statistician. Sunscreen use. Safety issues such as seatbelt and bike helmet use. Physical exam Your health care provider will check your: Height and weight. These may be used to calculate your BMI (body mass index). BMI is a measurement that tells if you are at a healthy weight. Waist circumference. This measures the distance around your waistline. This measurement also tells if you are at a healthy weight and may help predict your risk of certain diseases, such as type 2 diabetes and high blood pressure. Heart rate and blood pressure. Body temperature. Skin for abnormal spots. What immunizations do I need?  Vaccines are usually given at various ages, according to a schedule. Your health care provider will recommend vaccines for you based on your age, medical history, and lifestyle or other factors, such as travel or where you work. What tests do I need? Screening Your health care provider  may recommend screening tests for certain conditions. This may include: Lipid and cholesterol levels. Hepatitis C test. Hepatitis B test. HIV (human immunodeficiency virus) test. STI (sexually transmitted infection) testing, if you are at risk. Lung cancer screening. Colorectal cancer screening. Diabetes screening. This is done by checking your blood sugar (glucose) after you have not eaten for a while (fasting). Mammogram. Talk with your health care provider about how often you should have regular mammograms. BRCA-related cancer screening. This may be done if you have a family history of breast, ovarian, tubal, or peritoneal cancers. Bone density scan. This is done to screen for osteoporosis. Talk with your health care provider about your test results, treatment options, and if necessary, the need for more tests. Follow these instructions at home: Eating and drinking  Eat a diet that includes fresh fruits and vegetables, whole grains, lean protein, and low-fat dairy products. Limit your intake of foods with high amounts of sugar, saturated fats, and salt. Take vitamin and mineral supplements as recommended by your health care provider. Do not drink alcohol if your health care provider tells you not to drink. If you drink alcohol: Limit how much you have to 0-1 drink a day. Know how much alcohol is in your drink. In the [place] one drink equals one 12 oz bottle of beer (355 mL), one 5 oz glass of wine (148 mL), or one 1 oz glass of hard liquor (44 mL). Lifestyle Brush your teeth every morning and night with fluoride toothpaste. Floss one time each day. Exercise for at least 30 minutes 5 or more days each week. Do not use any products that contain nicotine or tobacco. These products include cigarettes, chewing tobacco, and  vaping devices, such as e-cigarettes. If you need help quitting, ask your health care provider. Do not use drugs. If you are sexually active, practice safe sex. Use a  condom or other form of protection in order to prevent STIs. Take aspirin only as told by your health care provider. Make sure that you understand how much to take and what form to take. Work with your health care provider to find out whether it is safe and beneficial for you to take aspirin daily. Ask your health care provider if you need to take a cholesterol-lowering medicine (statin). Find healthy ways to manage stress, such as: Meditation, yoga, or listening to music. Journaling. Talking to a trusted person. Spending time with friends and family. Minimize exposure to UV radiation to reduce your risk of skin cancer. Safety Always wear your seat belt while driving or riding in a vehicle. Do not drive: If you have been drinking alcohol. Do not ride with someone who has been drinking. When you are tired or distracted. While texting. If you have been using any mind-altering substances or drugs. Wear a helmet and other protective equipment during sports activities. If you have firearms in your house, make sure you follow all gun safety procedures. What's next? Visit your health care provider once a year for an annual wellness visit. Ask your health care provider how often you should have your eyes and teeth checked. Stay up to date on all vaccines. This information is not intended to replace advice given to you by your health care provider. Make sure you discuss any questions you have with your health care provider. Document Revised: 09/20/2020 Document Reviewed: 09/20/2020 Elsevier Patient Education  [employer].

## 2021-11-06 DIAGNOSIS — E063 Autoimmune thyroiditis: Secondary | ICD-10-CM | POA: Diagnosis not present

## 2021-11-06 DIAGNOSIS — K146 Glossodynia: Secondary | ICD-10-CM | POA: Diagnosis not present

## 2021-11-12 NOTE — Progress Notes (Unsigned)
Cardiology Office Note:    Date:  11/13/2021   ID:  Crystal Russell, DOB 10/23/1950, MRN [id number]  PCP:  Mosie Lukes, MD  [facility] Cardiologist: Rudean Haskell MD [place] Electrophysiologist:  None   CC: Aortic dilation  History of Present Illness:    Crystal Russell is a 71 y.o. female with a hx of PAF in [name], Mild Atrial Functional MR, Mild to moderate AI without LV dilation, PFO, possible distant DVT possible history of rheumatic and HLD who presents for evaluation 05/01/20.   2022: No AF on heart monitor. Only tolerate 5 mg of atorvastatin without myalgias.  2023: HR 180 at ID visit (no EKG)  Patient notes that she is doing well, except for some oral issues.  Swollen and burns when it eats foods.  Pursing lips causes discomfort. Since last visit notes that she is dealing with a lot has her dog is dying . There are no interval hospital/ED visit.    No chest pain or pressure .  No SOB/DOE and no PND/Orthopnea.  No weight gain or leg swelling.  No palpitations or syncope .   Past Medical History:  Diagnosis Date   Abdominal wall hernia 08/04/2012   Atrial fibrillation ([place]) 2008   REPORTS SHE WAS AT [place], UPON WAKING I FELT MY HEART GOING BOOM BOOM BOOM BEATING OUT OF MY CHEST. I WENT TO HOSPITAL AND THEY ISED MEDICINE TO CONVERT ME , IT TOOK OVER 10 HOURS TO CONVERT ME. BUT AFTER THAT IVE NEVER HAD ANY ISSUES SINCE. I USED TO HAVE A CARDIOLOGUIST AS MY PRIMARY IN [place] BUT IHAVENT BEEN TO A CARDIOLOGIST SINCE I MOVED DOWN HERE.    Chicken pox as a child   Depression    Depression with anxiety 12/27/2012   Diverticulitis    DJD (degenerative joint disease) 08/05/2012   DVT (deep venous thrombosis) ([place]) 2018   Fifth disease 1989   DX AFTER EXPOSURE FROM HER CHILDREN; DEVELOPED RHEUMATOID ARTHRITIS DURING COURSE OF ILLNESS. REPORTS:  " I DONT HAVE IT ANYMORE SINCE THE DISEASE IS GONE"    H/O gestational diabetes mellitus, not currently pregnant 08/05/2012    History of    Heart murmur    echo 2012; "I HAVE A FUNCTIONAL MURMUR"    Hyperlipidemia    Hypothyroidism    Knee effusion, right    while on Eliquis, discontinued Eliquis after DVT resolved   Measles as a child   Migraines    Other and unspecified hyperlipidemia 08/05/2012   Paroxysmal A-fib ([place])    Shingles 43   Spondylisthesis 2012   L5/S1 grade 3   Varicose veins 08/05/2012   Extending up to right groin B/l LE   Ventral hernia 08/05/2012   Wears glasses     Past Surgical History:  Procedure Laterality Date   BREAST DUCTAL SYSTEM EXCISION Right 05/05/2014   Procedure: RIGHT BREAST DUCT EXCISION;  Surgeon: Rolm Bookbinder, MD;  Location: [facility];  Service: General;  Laterality: Right;   COLONOSCOPY     DILATION AND CURETTAGE OF UTERUS     x4   EXCISIONAL TOTAL KNEE ARTHROPLASTY WITH ANTIBIOTIC SPACERS Right 09/14/2020   Procedure: Irrigation and debriement with poly exchange versus resection of right total knee arthroplasty and placement of antibitiotic spacer;  Surgeon: Paralee Cancel, MD;  Location: [facility];  Service: Orthopedics;  Laterality: Right;   MOUTH SURGERY     TOTAL KNEE ARTHROPLASTY Right 07/01/2017   Procedure: RIGHT TOTAL KNEE ARTHROPLASTY;  Surgeon: Paralee Cancel, MD;  Location: [facility];  Service: Orthopedics;  Laterality: Right;  70 mins   VARICOSE VEIN SURGERY  1995   right leg    Current Medications: Current Meds  Medication Sig   acetaminophen (TYLENOL) 500 MG tablet Take 1,000 mg by mouth as needed.   aspirin EC 81 MG tablet Take 81 mg by mouth daily. Swallow whole.   atorvastatin (LIPITOR) 10 MG tablet Take 0.5 tablets (5 mg total) by mouth daily.   Coenzyme Q10 300 MG CAPS Take 300 mg by mouth every evening.   doxycycline (MONODOX) 100 MG capsule Take 1 capsule (100 mg total) by mouth 2 (two) times daily.   ezetimibe (ZETIA) 10 MG tablet Take 1 tablet (10 mg total) by mouth daily.   levothyroxine (SYNTHROID) 88 MCG tablet TAKE ONE  TABLET BY MOUTH ONE TIME DAILY   polyethylene glycol (MIRALAX / GLYCOLAX) 17 g packet Take 17 g by mouth 2 (two) times daily.   [DISCONTINUED] Cholecalciferol (PA VITAMIN D-3) 50 MCG (2000 UT) CAPS Take 4,000 Units by mouth every evening.   [DISCONTINUED] diltiazem (CARDIZEM) 30 MG tablet Take 1 tablet (30 mg total) by mouth every 6 (six) hours as needed (palpitations).   [DISCONTINUED] fluconazole (DIFLUCAN) 150 MG tablet Take 1 tablet (150 mg total) by mouth once a week.   [DISCONTINUED] vitamin B-12 (CYANOCOBALAMIN) 100 MCG tablet daily.     Allergies:   Ciprofloxacin, Crestor [rosuvastatin], Pravachol [pravastatin], Cefazolin, and Rifampin   Social History   Socioeconomic History   Marital status: Married    Spouse name: Not on file   Number of children: Not on file   Years of education: Not on file   Highest education level: Not on file  Occupational History   Not on file  Tobacco Use   Smoking status: Former    Years: 4.00    Types: Cigarettes   Smokeless tobacco: Never  Vaping Use   Vaping Use: Never used  Substance and Sexual Activity   Alcohol use: No   Drug use: No   Sexual activity: Yes  Other Topics Concern   Not on file  Social History Narrative   Not on file   Social Determinants of Health   Financial Resource Strain: Not on file  Food Insecurity: Not on file  Transportation Needs: Not on file  Physical Activity: Not on file  Stress: Not on file  Social Connections: Not on file    SOCIAL: Son has UC; one son [name] (born on earth day) was very sick with [place], one son with two kids.  Doesn't do well with percentages in discussion Dealing with the loss of her dog  Family History: The patient's family history includes Asthma in her son and son; Cancer in her father and mother; Dementia in her maternal grandmother; Heart attack in her maternal grandfather and paternal uncle; Liver disease in her mother; Pancreatic cancer in an other family member;  Peripheral Artery Disease in her sister; Stomach cancer in an other family member; Ulcerative colitis in her son and son. There is no history of Colon cancer, Cystic fibrosis, or Esophageal cancer. History of coronary artery disease notable for grandfather. History of heart failure notable for no members. History of arrhythmia notable for no members. No history of bicuspid aortic valve or aortic aneurysm or dissection.  ROS:   Please see the history of present illness.    All other systems reviewed and are negative.  EKGs/Labs/Other Studies Reviewed:    The  following studies were reviewed today:  EKG:   11/12/21: Sinus bradycardia rate 49 05/01/2020: sinus Bradycardia rate 55 WNL 06/24/2017: Sinus Bradycardia Rate 51   ECHO COMPLETE WO IMAGING ENHANCING AGENT 03/14/2021 1. Left ventricular ejection fraction, by estimation, is 60 to 65%. Left ventricular ejection fraction by 3D volume is 60 %. The left ventricle has normal function. The left ventricle has no regional wall motion abnormalities. Left ventricular diastolic parameters are consistent with Grade I diastolic dysfunction (impaired relaxation). 2. Right ventricular systolic function is normal. The right ventricular size is normal. There is normal pulmonary artery systolic pressure. The estimated right ventricular systolic pressure is 40.9 mmHg. 3. Left atrial size was mildly dilated. 4. The mitral valve is normal in structure. Mild mitral valve regurgitation. 5. The aortic valve is tricuspid. There is mild thickening of the aortic valve. Aortic valve regurgitation is mild to moderate. Aortic valve sclerosis is present, with no evidence of aortic valve stenosis. 6. The inferior vena cava is normal in size with greater than 50% respiratory variability, suggesting right atrial pressure of 3 mmHg.  Comparison(s): Compared to prior TTE in 04/06/20, there continues to be mild-to-moderate AR. Otherwise there is no significant change. Marland Kitchen    CARDIAC TELEMETRY MONITORING-INTERPRETATION ONLY 06/07/2020  Narrative  Patient had a minimum heart rate of 39 bpm, maximum heart rate of 180 bpm, and average heart rate of 53 bpm.  Predominant underlying rhythm was sinus rhythm.  Two runs of nonsustained ventricular tachycardia occurred lasting 6 beats at longest with a max rate of 180 bpm at fastest.  Isolated PACs were rare (<1.0%).  Isolated PVCs were rare (<1.0%).  No evidence of atrial fibrillation.  Triggered and diary events associated with sinus rhythm, sinus bradycardia, and PACs.  Rare, symptomatic PACs.   Recent Labs: 09/24/2021: ALT 45; BUN 15; Creat 0.67; Hemoglobin 12.3; Platelets 235; Potassium 4.5; Sodium 136 10/01/2021: TSH 0.96  Recent Lipid Panel    Component Value Date/Time   CHOL 189 03/29/2021 1442   TRIG 52.0 03/29/2021 1442   HDL 69.00 03/29/2021 1442   CHOLHDL 3 03/29/2021 1442   VLDL 10.4 03/29/2021 1442   LDLCALC 110 (H) 03/29/2021 1442     Physical Exam:    VS:  BP 110/70   Pulse (!) 49   Ht 5' 4.5" (1.638 m)   Wt 139 lb (63 kg)   SpO2 99%   BMI 23.49 kg/m     Wt Readings from Last 3 Encounters:  11/13/21 139 lb (63 kg)  10/11/21 139 lb (63 kg)  07/02/21 135 lb (61.2 kg)   Gen: No distress Neck: No JVD Cardiac: No Rubs or Gallops, holodiastolic murmur, regular bradycardia, +2 radial pulses Respiratory: Clear to auscultation bilaterally, normal effort, normal  respiratory rate GI: Soft, nontender, non-distended  MS: No edema;  moves all extremities Integument: Skin feels warm, stable R knee scar Neuro:  At time of evaluation, alert and oriented to person/place/time/situation  Psych: Normal affect, patient feels unwell  ASSESSMENT:    1. Nonrheumatic aortic valve insufficiency   2. Aortic atherosclerosis ([place])   3. Mild mitral regurgitation   4. PFO (patent foramen ovale)   5. PAC (premature atrial contraction)   6. PSVT (paroxysmal supraventricular tachycardia) ([place])   7.  Hyperlipidemia, mixed     PLAN:    Aortic atherosclerosis Hyperlipidemia (mixed) -LDL goal less than 70 but did not tolerated statin greater than 5 mg atorvastatin - patient has chosen to take CoQ 110 - Discussed Zetia, Inclisran,  and PCSK9i, she is amenable to trial of zetia 10 mg and labs in three months   Mild to Moderate Aortic Insufficiency Mid Mitral Regurgitation PFO Given her chronic R knee infection she would be a higher risk candidate - asymptomatic - Echo in 03/2022  Paroxysmal Atrial Fibrillation  P-SVT PACs Hypothyroidism on synthroid  - Risk factors include gender and female - CHADSVASC=2. - TSH  WNL 6/23 - 30 mg PO dilitizem PRN palpitations (has needed none)  Me in one year unless worsening AI, if query of severe AI will get CMR with descending aorta flow reversal protocol    Medication Adjustments/Labs and Tests Ordered: Current medicines are reviewed at length with the patient today.  Concerns regarding medicines are outlined above.  Orders Placed This Encounter  Procedures   ALT   Lipid panel   EKG 12-Lead   ECHOCARDIOGRAM COMPLETE   Meds ordered this encounter  Medications   ezetimibe (ZETIA) 10 MG tablet    Sig: Take 1 tablet (10 mg total) by mouth daily.    Dispense:  90 tablet    Refill:  3    Patient Instructions  Medication Instructions:  Your physician has recommended you make the following change in your medication:  START: ezetimibe (Zetia) 10 mg by mouth once daily *If you need a refill on your cardiac medications before your next appointment, please call your pharmacy*   Lab Work: IN 3 Months: Fasting lipid panel and ALT (nothing to eat or drink 12 hours prior except water and black coffee)   If you have labs (blood work) drawn today and your tests are completely normal, you will receive your results only by: [place] (if you have MyChart) OR A paper copy in the mail If you have any lab test that is abnormal or we need  to change your treatment, we will call you to review the results.   Testing/Procedures: DEC 2023- Your physician has requested that you have an echocardiogram. Echocardiography is a painless test that uses sound waves to create images of your heart. It provides your doctor with information about the size and shape of your heart and how well your heart's chambers and valves are working. This procedure takes approximately one hour. There are no restrictions for this procedure.    Follow-Up: At [facility], you and your health needs are our priority.  As part of our continuing mission to provide you with exceptional heart care, we have created designated Provider Care Teams.  These Care Teams include your primary Cardiologist (physician) and Advanced Practice Providers (APPs -  Physician Assistants and Nurse Practitioners) who all work together to provide you with the care you need, when you need it.  Your next appointment:   1 year(s)  The format for your next appointment:   In Person  Provider:   Werner Lean, MD    Important Information About Sugar         Signed, Werner Lean, MD  11/13/2021 10:18 AM    [place]

## 2021-11-13 ENCOUNTER — Ambulatory Visit (INDEPENDENT_AMBULATORY_CARE_PROVIDER_SITE_OTHER): Payer: Medicare Other | Admitting: Internal Medicine

## 2021-11-13 ENCOUNTER — Encounter: Payer: Self-pay | Admitting: Internal Medicine

## 2021-11-13 VITALS — BP 110/70 | HR 49 | Ht 64.5 in | Wt 139.0 lb

## 2021-11-13 DIAGNOSIS — E782 Mixed hyperlipidemia: Secondary | ICD-10-CM

## 2021-11-13 DIAGNOSIS — I491 Atrial premature depolarization: Secondary | ICD-10-CM

## 2021-11-13 DIAGNOSIS — I351 Nonrheumatic aortic (valve) insufficiency: Secondary | ICD-10-CM

## 2021-11-13 DIAGNOSIS — Q2112 Patent foramen ovale: Secondary | ICD-10-CM

## 2021-11-13 DIAGNOSIS — I471 Supraventricular tachycardia: Secondary | ICD-10-CM | POA: Diagnosis not present

## 2021-11-13 DIAGNOSIS — I340 Nonrheumatic mitral (valve) insufficiency: Secondary | ICD-10-CM

## 2021-11-13 DIAGNOSIS — I700 Atherosclerosis of aorta: Secondary | ICD-10-CM | POA: Diagnosis not present

## 2021-11-13 MED ORDER — EZETIMIBE 10 MG PO TABS: 10.0000 mg | ORAL_TABLET | Freq: Every day | ORAL | 3 refills | Status: DC

## 2021-11-13 NOTE — Patient Instructions (Signed)
Medication Instructions:  Your physician has recommended you make the following change in your medication:  START: ezetimibe (Zetia) 10 mg by mouth once daily *If you need a refill on your cardiac medications before your next appointment, please call your pharmacy*   Lab Work: IN 3 Months: Fasting lipid panel and ALT (nothing to eat or drink 12 hours prior except water and black coffee)   If you have labs (blood work) drawn today and your tests are completely normal, you will receive your results only by: [place] (if you have MyChart) OR A paper copy in the mail If you have any lab test that is abnormal or we need to change your treatment, we will call you to review the results.   Testing/Procedures: DEC 2023- Your physician has requested that you have an echocardiogram. Echocardiography is a painless test that uses sound waves to create images of your heart. It provides your doctor with information about the size and shape of your heart and how well your heart's chambers and valves are working. This procedure takes approximately one hour. There are no restrictions for this procedure.    Follow-Up: At [facility], you and your health needs are our priority.  As part of our continuing mission to provide you with exceptional heart care, we have created designated Provider Care Teams.  These Care Teams include your primary Cardiologist (physician) and Advanced Practice Providers (APPs -  Physician Assistants and Nurse Practitioners) who all work together to provide you with the care you need, when you need it.  Your next appointment:   1 year(s)  The format for your next appointment:   In Person  Provider:   Werner Lean, MD    Important Information About Sugar

## 2021-11-26 DIAGNOSIS — H2513 Age-related nuclear cataract, bilateral: Secondary | ICD-10-CM | POA: Diagnosis not present

## 2021-11-26 DIAGNOSIS — H353130 Nonexudative age-related macular degeneration, bilateral, stage unspecified: Secondary | ICD-10-CM | POA: Diagnosis not present

## 2021-11-26 DIAGNOSIS — H52223 Regular astigmatism, bilateral: Secondary | ICD-10-CM | POA: Diagnosis not present

## 2021-11-26 DIAGNOSIS — H524 Presbyopia: Secondary | ICD-10-CM | POA: Diagnosis not present

## 2021-11-26 DIAGNOSIS — H5203 Hypermetropia, bilateral: Secondary | ICD-10-CM | POA: Diagnosis not present

## 2021-11-26 DIAGNOSIS — H43812 Vitreous degeneration, left eye: Secondary | ICD-10-CM | POA: Diagnosis not present

## 2021-12-04 DIAGNOSIS — K146 Glossodynia: Secondary | ICD-10-CM | POA: Diagnosis not present

## 2022-01-03 DIAGNOSIS — L578 Other skin changes due to chronic exposure to nonionizing radiation: Secondary | ICD-10-CM | POA: Diagnosis not present

## 2022-01-03 DIAGNOSIS — L814 Other melanin hyperpigmentation: Secondary | ICD-10-CM | POA: Diagnosis not present

## 2022-01-03 DIAGNOSIS — D1801 Hemangioma of skin and subcutaneous tissue: Secondary | ICD-10-CM | POA: Diagnosis not present

## 2022-01-03 DIAGNOSIS — D225 Melanocytic nevi of trunk: Secondary | ICD-10-CM | POA: Diagnosis not present

## 2022-01-03 DIAGNOSIS — L821 Other seborrheic keratosis: Secondary | ICD-10-CM | POA: Diagnosis not present

## 2022-01-03 DIAGNOSIS — B078 Other viral warts: Secondary | ICD-10-CM | POA: Diagnosis not present

## 2022-01-05 IMAGING — CT CT ANGIO CHEST
2 of 4 series · 19 of 36 positions shown · IV contrast (OMNIPAQUE 350)
Comparison: None.

CLINICAL DATA: Chest palpitations, history of DVT, paroxysmal
atrial fibrillation

EXAM:
CT ANGIOGRAPHY CHEST WITH CONTRAST
TECHNIQUE: Multidetector CT imaging of the chest was performed using the
standard protocol during bolus administration of intravenous
contrast. Multiplanar CT image reconstructions and MIPs were
obtained to evaluate the vascular anatomy.
CONTRAST:  80mL OMNIPAQUE IOHEXOL 350 MG/ML SOLN

[Series 6: thins · axial · 0.62mm/px · z∈[-40,+198]mm · 18 of 257 slices shown]
[im 10/257  lung]
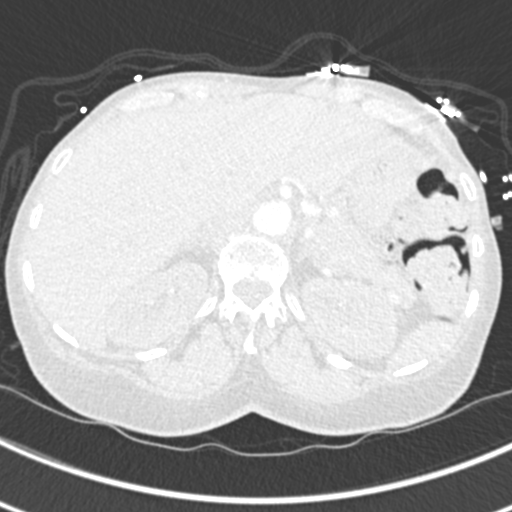
[im 29/257  mediastinal]
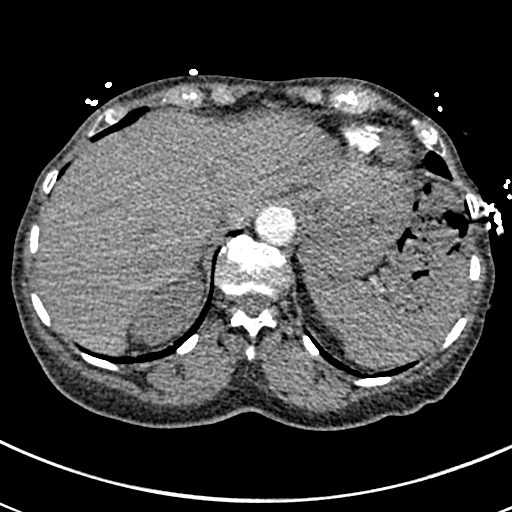
[im 38/257  lung]
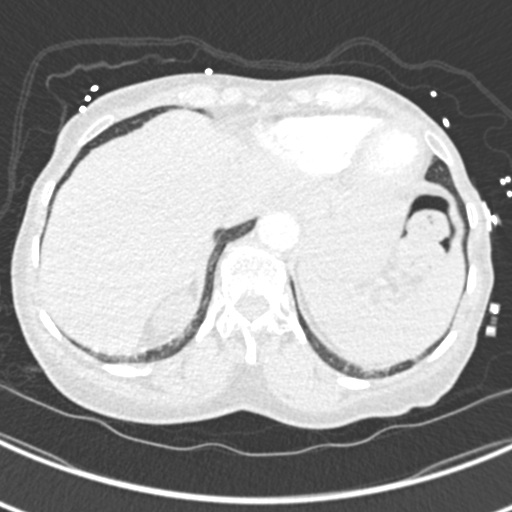
[im 57/257  mediastinal]
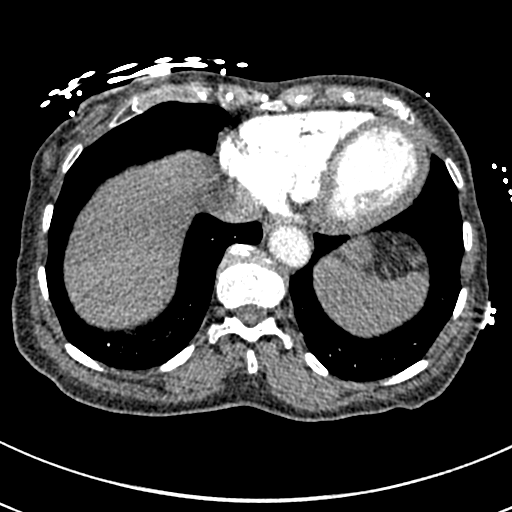
[im 67/257  lung]
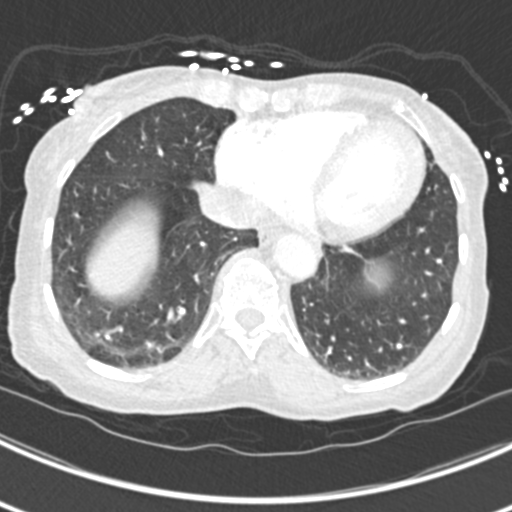
[im 76/257  mediastinal]
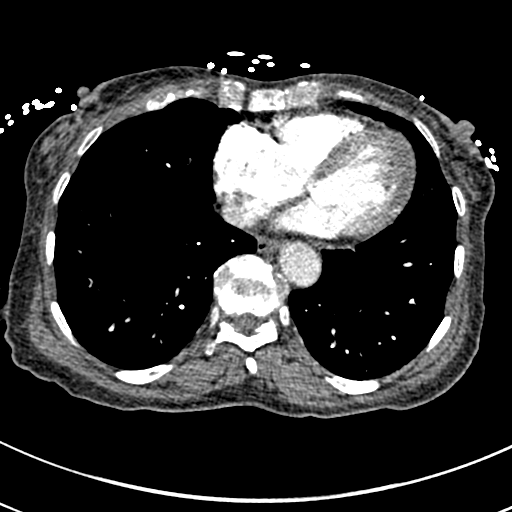
[im 95/257  lung]
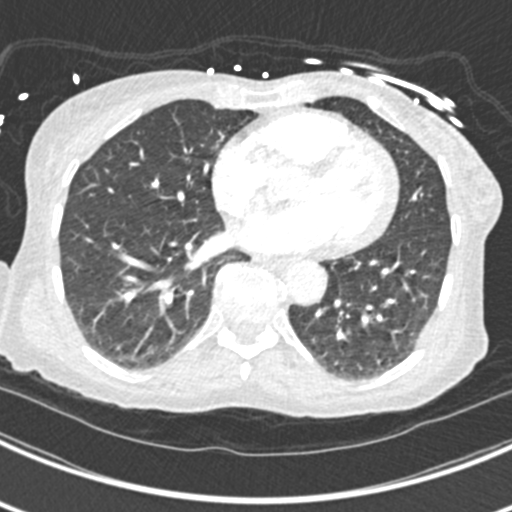
[im 105/257  mediastinal]
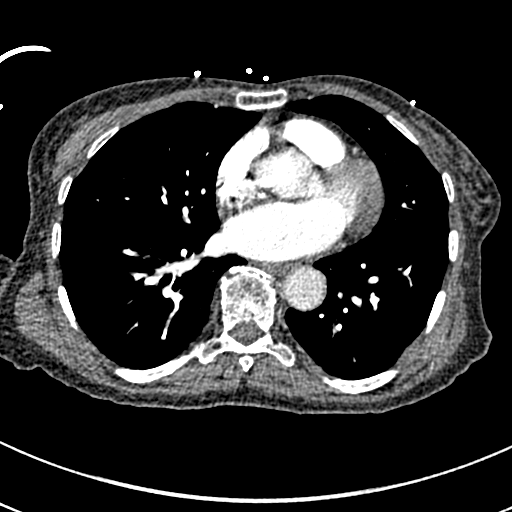
[im 124/257  lung]
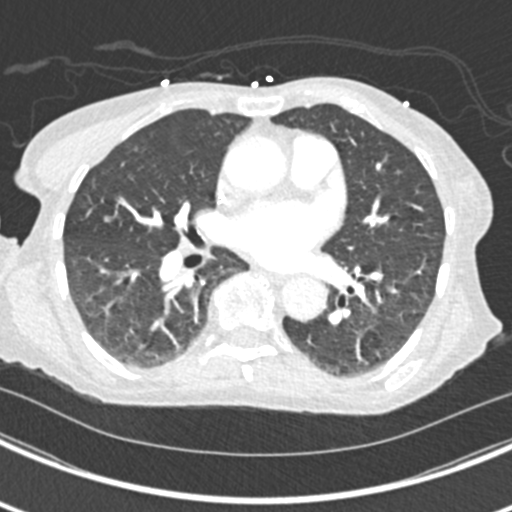
[im 133/257  mediastinal]
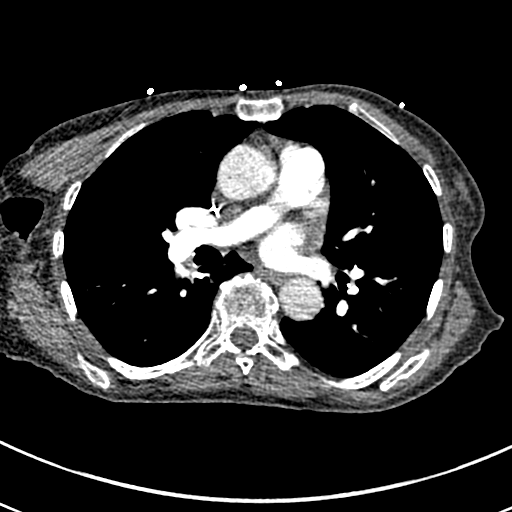
[im 152/257  lung]
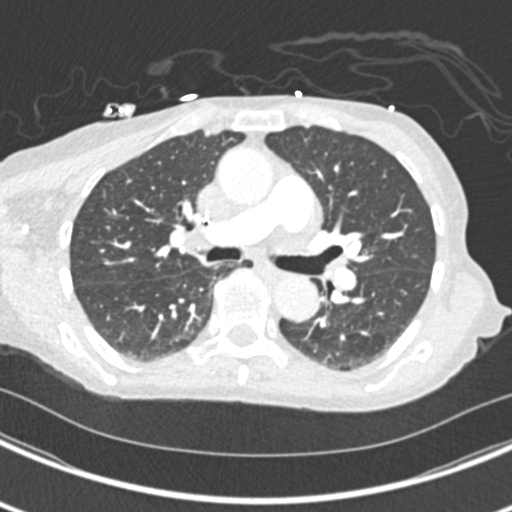
[im 162/257  mediastinal]
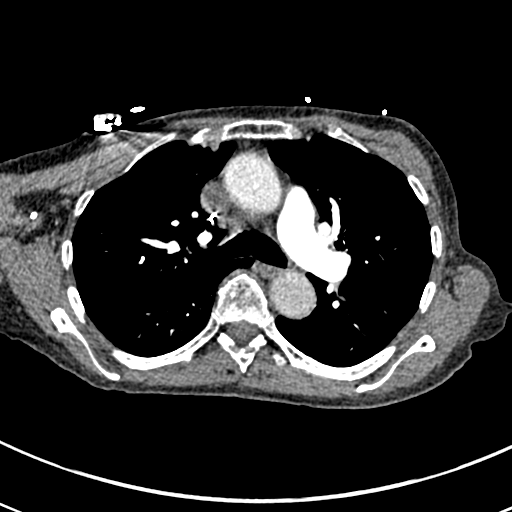
[im 181/257  lung]
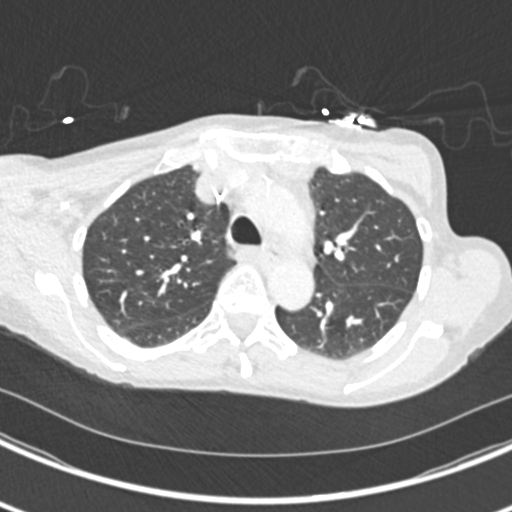
[im 190/257  mediastinal]
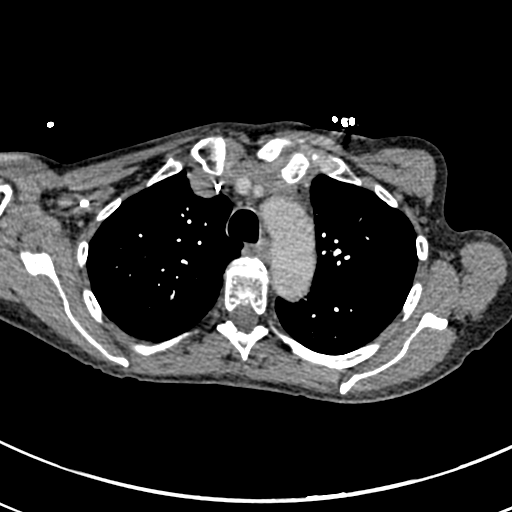
[im 200/257  lung]
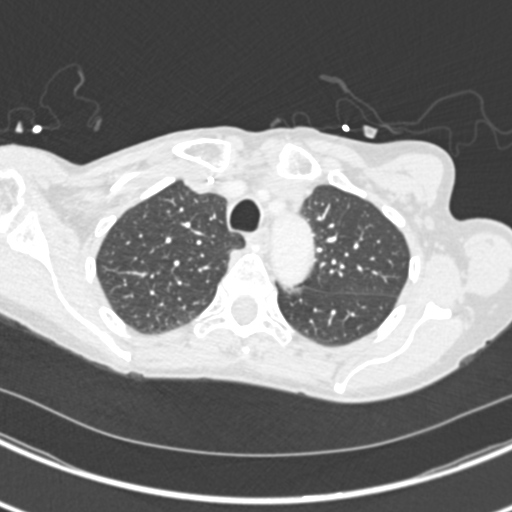
[im 219/257  mediastinal]
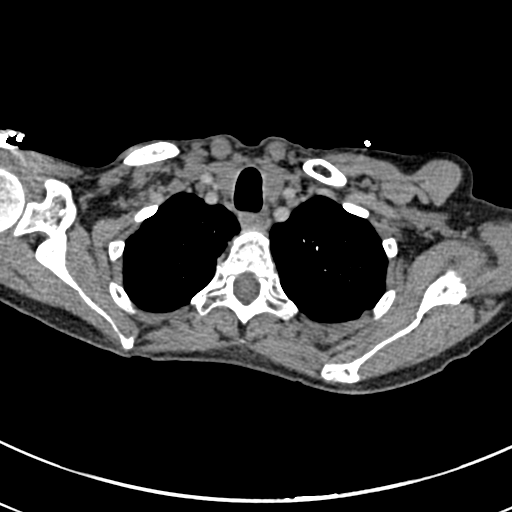
[im 228/257  lung]
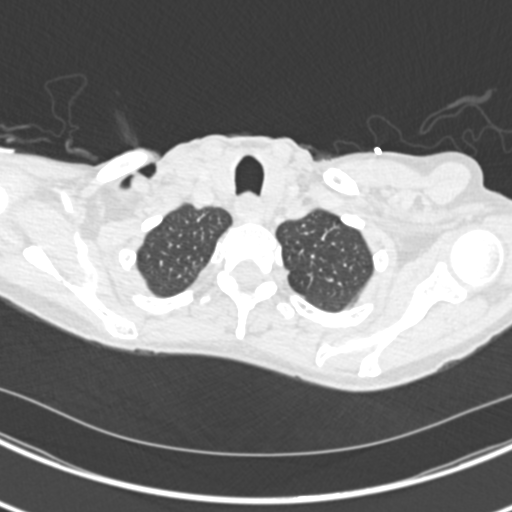
[im 247/257  mediastinal]
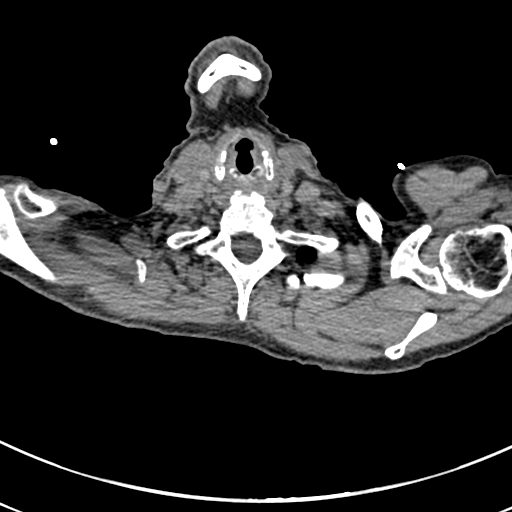

[Series 8: coronal mpr · coronal · 0.52mm/px · 1 of 102 slices shown]
[im 51/102  mediastinal]
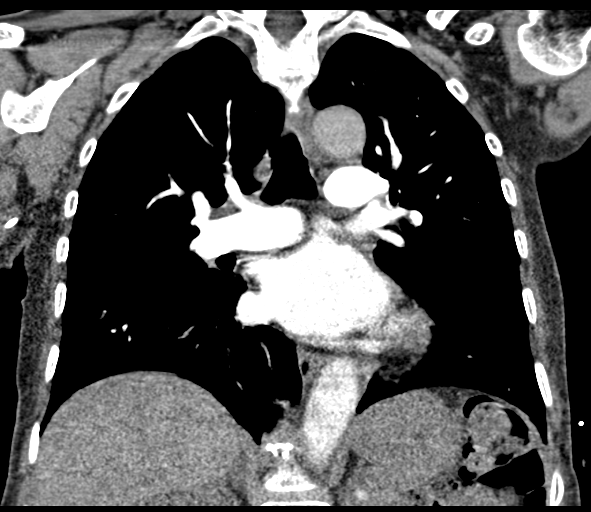

[19 of 36 positions shown; findings below may reference images not displayed]

FINDINGS: Cardiovascular: Satisfactory opacification of the pulmonary arteries
to the segmental level. No evidence of pulmonary embolism. Normal
heart size. No pericardial effusion. Right upper extremity PICC line
tip is seen within the superior right atrium.

Mediastinum/Nodes: No enlarged mediastinal, hilar, or axillary lymph
nodes. Thyroid gland, trachea, and esophagus demonstrate no
significant findings.

Lungs/Pleura: Lungs are clear. No pleural effusion or pneumothorax.

Upper Abdomen: No acute abnormality.

Musculoskeletal: No acute bone abnormality. Osseous structures are
age-appropriate. Moderate degenerative changes are seen within the
thoracic spine.

Review of the MIP images confirms the above findings.
IMPRESSION: No pulmonary embolism.  No acute intrathoracic pathology identified.

## 2022-01-08 ENCOUNTER — Encounter: Payer: Self-pay | Admitting: Family Medicine

## 2022-01-08 NOTE — Telephone Encounter (Signed)
Patient has MCR A&B and appointment notes indicated annual A&B.  [facility] billed "Z" code for visit; therefore, patient was incorrectly charged/billed for visit.

## 2022-01-10 ENCOUNTER — Ambulatory Visit (HOSPITAL_COMMUNITY)
Admission: RE | Admit: 2022-01-10 | Discharge: 2022-01-10 | Disposition: A | Discharge status: Home / Self Care | Payer: Medicare Other | Source: Ambulatory Visit | Attending: Cardiology | Admitting: Cardiology

## 2022-01-10 ENCOUNTER — Other Ambulatory Visit (HOSPITAL_COMMUNITY): Payer: Self-pay | Admitting: Medical

## 2022-01-10 DIAGNOSIS — M25562 Pain in left knee: Secondary | ICD-10-CM

## 2022-01-10 DIAGNOSIS — M79605 Pain in left leg: Secondary | ICD-10-CM

## 2022-01-14 DIAGNOSIS — M25562 Pain in left knee: Secondary | ICD-10-CM | POA: Diagnosis not present

## 2022-01-15 ENCOUNTER — Ambulatory Visit: Payer: Medicare Other | Admitting: Family Medicine

## 2022-01-15 DIAGNOSIS — R5382 Chronic fatigue, unspecified: Secondary | ICD-10-CM | POA: Diagnosis not present

## 2022-01-15 DIAGNOSIS — E063 Autoimmune thyroiditis: Secondary | ICD-10-CM | POA: Diagnosis not present

## 2022-01-15 DIAGNOSIS — L659 Nonscarring hair loss, unspecified: Secondary | ICD-10-CM | POA: Diagnosis not present

## 2022-01-15 DIAGNOSIS — E038 Other specified hypothyroidism: Secondary | ICD-10-CM | POA: Diagnosis not present

## 2022-02-07 DIAGNOSIS — Z23 Encounter for immunization: Secondary | ICD-10-CM | POA: Diagnosis not present

## 2022-02-13 ENCOUNTER — Ambulatory Visit: Payer: Medicare Other | Attending: Internal Medicine

## 2022-02-13 DIAGNOSIS — I700 Atherosclerosis of aorta: Secondary | ICD-10-CM | POA: Diagnosis not present

## 2022-02-13 LAB — ALT: ALT: 52 IU/L — ABNORMAL HIGH (ref 0–32)

## 2022-02-13 LAB — LIPID PANEL
Chol/HDL Ratio: 2.5 ratio (ref 0.0–4.4)
Cholesterol, Total: 157 mg/dL (ref 100–199)
HDL: 63 mg/dL (ref >39)
LDL Chol Calc (NIH): 84 mg/dL (ref 0–99)
Triglycerides: 48 mg/dL (ref 0–149)
VLDL Cholesterol Cal: 10 mg/dL (ref 5–40)

## 2022-02-14 DIAGNOSIS — Z23 Encounter for immunization: Secondary | ICD-10-CM | POA: Diagnosis not present

## 2022-02-18 ENCOUNTER — Telehealth: Payer: Self-pay

## 2022-02-18 DIAGNOSIS — I700 Atherosclerosis of aorta: Secondary | ICD-10-CM

## 2022-02-18 DIAGNOSIS — E782 Mixed hyperlipidemia: Secondary | ICD-10-CM

## 2022-02-18 NOTE — Telephone Encounter (Signed)
-----   Message from Werner Lean, MD sent at 02/17/2022  2:35 PM EST ----- Results: ALT still slightly elevated (not true transaminitis) LDL above goal  Plan: Lipid clinic referral  Werner Lean, MD

## 2022-02-18 NOTE — Telephone Encounter (Signed)
Order placed for lipid clinic.

## 2022-02-24 NOTE — Progress Notes (Signed)
Patient ID: Crystal Russell                 DOB: Aug 04, 1950                    MRN: [id number]     HPI: Crystal Russell is a 71 y.o. female patient referred to lipid clinic by Dr. Gasper Sells. PMH is significant for paroxysmal afib, DVT, HLD, hypothyroidism, migraines, aortic atherosclerosis, PFO, mild mitral regurgitation, and mild-moderate aortic regurgitation. Last echocardiogram on 03/14/21 showed LVEF of [id number], grade I diastolic dysfunction, and normal RV function. She has a history of intolerance to rosuvastatin and pravastatin, but has been able to tolerate atorvastatin 5 mg daily. Patient was last seen by Dr. Gasper Sells on 11/13/21 at which time Zetia 10 mg daily was added due to LDL of 110. At follow up 3 months after starting Zetia, LDL remained above goal at 84.  Patient presents today with her husband in good spirits. She reports no symptoms of chest pain, palpitations, or fatigue today. She recounted her extensive history of statin intolerance to multiple medications. She reports tolerating atorvastatin 5 mg daily and ezetimibe 10 mg daily well, only complaining of intermittent left hand soreness. She takes CoQ10 with her statin. She reported a heart healthy diet and exercise regimen.  Current Medications: atorvastatin 5 mg daily and ezetimibe 10 mg daily Intolerances: rosuvastatin unknown dose (joint pain and inner thigh pain) and pitavastatin 2 mg daily (joint pain), and atorvastatin 10 mg daily (joint pain), pravastatin unknown dose (joint pain) Risk Factors: aortic atherosclerosis LDL goal: <70 mg/dL  Diet:  Breakfast: low fat cottage cheese, coffee, fruit, or oatmeal Lunch: salad, vegetable soup, chicken wrap, rarely eggs Dinner: chicken or fish, veggies  Exercise:  Walks daily for 1.5 hours  Family History:  Family History  Problem Relation Age of Onset   Cancer Mother        lung- smoker   Liver disease Mother    Cancer Father        lung- smoker   Peripheral Artery  Disease Sister    Heart attack Paternal Uncle    Dementia Maternal Grandmother    Heart attack Maternal Grandfather    Asthma Son    Ulcerative colitis Son    Asthma Son    Ulcerative colitis Son    Pancreatic cancer Other    Stomach cancer Other    Colon cancer Neg Hx    Cystic fibrosis Neg Hx    Esophageal cancer Neg Hx     Social History:  Social History   Socioeconomic History   Marital status: Married    Spouse name: Not on file   Number of children: Not on file   Years of education: Not on file   Highest education level: Not on file  Occupational History   Not on file  Tobacco Use   Smoking status: Former    Years: 4.00    Types: Cigarettes   Smokeless tobacco: Never  Vaping Use   Vaping Use: Never used  Substance and Sexual Activity   Alcohol use: No   Drug use: No   Sexual activity: Yes  Other Topics Concern   Not on file  Social History Narrative   Not on file   Social Determinants of Health   Financial Resource Strain: Not on file  Food Insecurity: Not on file  Transportation Needs: Not on file  Physical Activity: Not on file  Stress: Not on file  Social Connections: Not  on file  Intimate Partner Violence: Not on file    Labs:  02/13/22 on atorvastatin 5 mg daily and ezetimibe 10 mg daily: TC 157, TG 48, HDL 63, VLDL 10, LDL 84, ALT 52  03/29/21 on atorvastatin 5 mg daily: TC 189, TG 52, HDL 69, VLDL 10.4, LDL 110, ALT 86  Past Medical History:  Diagnosis Date   Abdominal wall hernia 08/04/2012   Atrial fibrillation ([place]) 2008   REPORTS SHE WAS AT [place], UPON WAKING I FELT MY HEART GOING BOOM BOOM BOOM BEATING OUT OF MY CHEST. I WENT TO HOSPITAL AND THEY ISED MEDICINE TO CONVERT ME , IT TOOK OVER 10 HOURS TO CONVERT ME. BUT AFTER THAT IVE NEVER HAD ANY ISSUES SINCE. I USED TO HAVE A CARDIOLOGUIST AS MY PRIMARY IN [place] BUT IHAVENT BEEN TO A CARDIOLOGIST SINCE I MOVED DOWN HERE.    Chicken pox as a child   Depression    Depression with  anxiety 12/27/2012   Diverticulitis    DJD (degenerative joint disease) 08/05/2012   DVT (deep venous thrombosis) ([place]) 2018   Fifth disease 1989   DX AFTER EXPOSURE FROM HER CHILDREN; DEVELOPED RHEUMATOID ARTHRITIS DURING COURSE OF ILLNESS. REPORTS:  " I DONT HAVE IT ANYMORE SINCE THE DISEASE IS GONE"    H/O gestational diabetes mellitus, not currently pregnant 08/05/2012   History of    Heart murmur    echo 2012; "I HAVE A FUNCTIONAL MURMUR"    Hyperlipidemia    Hypothyroidism    Knee effusion, right    while on Eliquis, discontinued Eliquis after DVT resolved   Measles as a child   Migraines    Other and unspecified hyperlipidemia 08/05/2012   Paroxysmal A-fib ([place])    Shingles 43   Spondylisthesis 2012   L5/S1 grade 3   Varicose veins 08/05/2012   Extending up to right groin B/l LE   Ventral hernia 08/05/2012   Wears glasses     Current Outpatient Medications on File Prior to Visit  Medication Sig Dispense Refill   acetaminophen (TYLENOL) 500 MG tablet Take 1,000 mg by mouth as needed.     aspirin EC 81 MG tablet Take 81 mg by mouth daily. Swallow whole.     atorvastatin (LIPITOR) 10 MG tablet Take 0.5 tablets (5 mg total) by mouth daily. 45 tablet 3   Coenzyme Q10 300 MG CAPS Take 300 mg by mouth every evening.     doxycycline (MONODOX) 100 MG capsule Take 1 capsule (100 mg total) by mouth 2 (two) times daily. 180 capsule 3   ezetimibe (ZETIA) 10 MG tablet Take 1 tablet (10 mg total) by mouth daily. 90 tablet 3   levothyroxine (SYNTHROID) 88 MCG tablet TAKE ONE TABLET BY MOUTH ONE TIME DAILY 90 tablet 0   polyethylene glycol (MIRALAX / GLYCOLAX) 17 g packet Take 17 g by mouth 2 (two) times daily.     No current facility-administered medications on file prior to visit.    Allergies  Allergen Reactions   Ciprofloxacin Other (See Comments)    Abdominal pain    Crestor [Rosuvastatin] Other (See Comments)    Joint pain w/fibromyalgia   Pravachol [Pravastatin] Other (See  Comments)    Joint pain w/fibromyalgia   Cefazolin Rash   Rifampin Rash    Assessment/Plan:  1. Hyperlipidemia - LDL of 84 is above goal < 70 on atorvastatin 5 mg daily and ezetimibe 10 mg daily. Patient reports adherence to medications. Given LDL above goal on  maximum tolerated statin, will submit prior authorization for Repatha 140 mg every 14 days. Will call patient when Repatha is approved. Patient was also counseled to continue her current heart healthy diet. She was also encouraged to continue current practice of >150 minutes of moderate intensity exercise per week. PharmD will schedule follow-up appointment via telephone after Repatha is approved. Patient was thoroughly educated on administration, storage, and side effects of PCSK9i therapy.  Thank you for allowing pharmacy to participate in this patient's care.  Reatha Harps, PharmD PGY2 Pharmacy Resident 02/25/2022 3:23 PM Check [email or web address] for unit specific pharmacy number

## 2022-02-25 ENCOUNTER — Ambulatory Visit: Payer: Medicare Other | Attending: Internal Medicine | Admitting: Pharmacist

## 2022-02-25 ENCOUNTER — Encounter: Payer: Self-pay | Admitting: Pharmacist

## 2022-02-25 DIAGNOSIS — I700 Atherosclerosis of aorta: Secondary | ICD-10-CM

## 2022-02-25 DIAGNOSIS — E782 Mixed hyperlipidemia: Secondary | ICD-10-CM | POA: Diagnosis not present

## 2022-02-25 NOTE — Patient Instructions (Signed)
I will submit information to your insurance for Repatha and let you know when I hear back.    Repatha is a subcutaneous injection given once every 2 weeks in the fatty tissue of your stomach or upper outer thigh. Store the medication in the fridge. You can let your dose warm up to room temperature for 30 minutes before injecting if you prefer. Repatha will lower your LDL cholesterol by 60% and helps to lower your chance of having a heart attack or stroke.   Continue atorvastatin 5 mg daily Continue Zetia 10 mg daily until prior authorization for Repatha is approved, then stop ezetimibe

## 2022-02-26 ENCOUNTER — Encounter: Payer: Self-pay | Admitting: Pharmacist

## 2022-02-26 ENCOUNTER — Telehealth: Payer: Self-pay | Admitting: Pharmacist

## 2022-02-26 DIAGNOSIS — E782 Mixed hyperlipidemia: Secondary | ICD-10-CM

## 2022-02-26 MED ORDER — PRALUENT 75 MG/ML ~~LOC~~ SOAJ: 75.0000 mg | SUBCUTANEOUS | 11 refills | Status: DC

## 2022-02-26 NOTE — Telephone Encounter (Signed)
Addressed in separate mychart encounter, pt has been enrolled in [employer].

## 2022-02-26 NOTE — Addendum Note (Signed)
Addended by: Ursula Beath on: 02/26/2022 10:46 AM   Modules accepted: Orders

## 2022-02-26 NOTE — Telephone Encounter (Signed)
Received patient message concerning cost of Praluent. PharmD applied for [place] which has been approved. Information for use:  CARD NO. [id number]   BIN [id number]   PCN [id number]   GROUP [id number]   She is approved for 1 year. She plans to start Praluent the first week of December. Labs are scheduled for 05/20/22.

## 2022-03-06 DIAGNOSIS — E063 Autoimmune thyroiditis: Secondary | ICD-10-CM | POA: Diagnosis not present

## 2022-03-06 DIAGNOSIS — E038 Other specified hypothyroidism: Secondary | ICD-10-CM | POA: Diagnosis not present

## 2022-03-19 ENCOUNTER — Ambulatory Visit (HOSPITAL_COMMUNITY): Payer: Medicare Other | Attending: Internal Medicine

## 2022-03-19 DIAGNOSIS — I080 Rheumatic disorders of both mitral and aortic valves: Secondary | ICD-10-CM | POA: Diagnosis not present

## 2022-03-19 DIAGNOSIS — I700 Atherosclerosis of aorta: Secondary | ICD-10-CM

## 2022-03-19 DIAGNOSIS — I4891 Unspecified atrial fibrillation: Secondary | ICD-10-CM | POA: Diagnosis not present

## 2022-03-19 DIAGNOSIS — I351 Nonrheumatic aortic (valve) insufficiency: Secondary | ICD-10-CM | POA: Diagnosis not present

## 2022-03-19 DIAGNOSIS — E785 Hyperlipidemia, unspecified: Secondary | ICD-10-CM | POA: Insufficient documentation

## 2022-03-19 DIAGNOSIS — Z87891 Personal history of nicotine dependence: Secondary | ICD-10-CM | POA: Diagnosis not present

## 2022-03-19 LAB — ECHOCARDIOGRAM COMPLETE
Area-P 1/2: 3.76 cm2
P 1/2 time: 776 msec
S' Lateral: 2.7 cm

## 2022-03-22 ENCOUNTER — Other Ambulatory Visit: Payer: Self-pay | Admitting: Family Medicine

## 2022-03-23 ENCOUNTER — Encounter: Payer: Self-pay | Admitting: Internal Medicine

## 2022-03-25 ENCOUNTER — Ambulatory Visit: Payer: Medicare Other | Admitting: Internal Medicine

## 2022-04-04 ENCOUNTER — Ambulatory Visit: Payer: Medicare Other | Admitting: Internal Medicine

## 2022-04-07 ENCOUNTER — Other Ambulatory Visit: Payer: Self-pay | Admitting: Internal Medicine

## 2022-04-08 ENCOUNTER — Encounter: Payer: Self-pay | Admitting: Internal Medicine

## 2022-04-09 MED ORDER — ATORVASTATIN CALCIUM 10 MG PO TABS: 5.0000 mg | ORAL_TABLET | Freq: Every day | ORAL | 2 refills | Status: DC

## 2022-04-11 ENCOUNTER — Other Ambulatory Visit: Payer: Self-pay

## 2022-04-11 ENCOUNTER — Ambulatory Visit (INDEPENDENT_AMBULATORY_CARE_PROVIDER_SITE_OTHER): Payer: Medicare Other | Admitting: Internal Medicine

## 2022-04-11 ENCOUNTER — Encounter: Payer: Self-pay | Admitting: Internal Medicine

## 2022-04-11 VITALS — Resp 16 | Ht 64.5 in | Wt 139.0 lb

## 2022-04-11 DIAGNOSIS — M009 Pyogenic arthritis, unspecified: Secondary | ICD-10-CM

## 2022-04-11 NOTE — Progress Notes (Signed)
RFV: follow up for pji on doxycycline  Patient ID: Crystal Russell, female   DOB: Apr 07, 1951, 72 y.o.   MRN: [id number]  HPI 72yo F with hx of strep/staph pji  of right knee pji s/p 1 staged revision with polyethylene revision/open excisional and non-excisional debridement of right knee by dr Alvan Dame on June 10th, 2022. In terms of micro data-  Has staph species on synovasure but strep mutans on cx results.  She finished 6 wk of iv abtx and  has been on chronic doxy .She had rash and intolerance to cephalosporins. - on doxycycline '100mg'$  po bid suppression  Last seen in June 2023  Went to [facility] membrane specialist? - went -- did tacrolimus swish and swallow and anti-fungal that has helped alittle bit? Still swollen?  Had left knee-baker's cyst -- but bone and bone knee pain -- where she had cyst aspirated.   Hesistant - to getting infusion for cholesterol medication.   Outpatient Encounter Medications as of 04/11/2022  Medication Sig   acetaminophen (TYLENOL) 500 MG tablet Take 1,000 mg by mouth as needed.   Alirocumab (PRALUENT) 75 MG/ML SOAJ Inject 75 mg into the skin every 14 (fourteen) days.   aspirin EC 81 MG tablet Take 81 mg by mouth daily. Swallow whole.   atorvastatin (LIPITOR) 10 MG tablet Take 0.5 tablets (5 mg total) by mouth daily.   Coenzyme Q10 300 MG CAPS Take 300 mg by mouth every evening.   doxycycline (MONODOX) 100 MG capsule Take 1 capsule (100 mg total) by mouth 2 (two) times daily.   ezetimibe (ZETIA) 10 MG tablet Take 1 tablet (10 mg total) by mouth daily.   levothyroxine (SYNTHROID) 88 MCG tablet TAKE ONE TABLET BY MOUTH ONE TIME DAILY   polyethylene glycol (MIRALAX / GLYCOLAX) 17 g packet Take 17 g by mouth 2 (two) times daily.   No facility-administered encounter medications on file as of 04/11/2022.     Patient Active Problem List   Diagnosis Date Noted   Dry mouth 10/11/2021   Hyponatremia 10/11/2021   PSVT (paroxysmal supraventricular tachycardia) 05/08/2021    Aortic atherosclerosis ([place]) 05/08/2021   Infection of prosthetic right knee joint ([place]) 09/14/2020   PAC (premature atrial contraction) 07/28/2020   Nonrheumatic aortic valve insufficiency 05/01/2020   Mild mitral regurgitation 05/01/2020   PFO (patent foramen ovale) 05/01/2020   Diverticulitis 03/16/2020   Hyperglycemia 03/16/2020   Vertigo 09/14/2019   Arthritis, septic, knee ([place]) 06/16/2017   Postmenopausal bleeding 12/06/2015   Varicose veins of bilateral lower extremities with other complications 40/98/1191   Sun-damaged skin 12/04/2014   Breast mass, left 05/16/2014   Varicose veins of lower extremities with other complications 47/82/9562   Preventative health care 08/08/2013   Hypothyroidism 08/08/2013   Atrophic vaginitis 08/08/2013   Cervical cancer screening 08/05/2013   ETD (eustachian tube dysfunction) 05/26/2013   Depression with anxiety 12/27/2012   Ventral hernia 08/05/2012   Varicose veins 08/05/2012   DJD (degenerative joint disease) 08/05/2012   Constipation 08/05/2012   Hyperlipidemia, mixed 08/05/2012   H/O gestational diabetes mellitus, not currently pregnant 08/05/2012   Chicken pox    Shingles    Paroxysmal A-fib ([place])    Spondylisthesis      Health Maintenance Due  Topic Date Due   Medicare Annual Wellness (AWV)  Never done   Zoster Vaccines- Shingrix (1 of 2) Never done   COLONOSCOPY (Pts 45-28yr Insurance coverage will need to be confirmed)  07/15/2018   COVID-19 Vaccine (5 - 2023-24 season)  12/07/2021     Review of Systems Always having knee pain. 6 miles to ambulate. Still some mucosal discomfort to mouth  Constitutional: Negative for fever, chills, diaphoresis, activity change, appetite change, fatigue and unexpected weight change.  HENT: Negative for congestion, sore throat, rhinorrhea, sneezing, trouble swallowing and sinus pressure.  Eyes: Negative for photophobia and visual disturbance.  Respiratory: Negative for cough, chest  tightness, shortness of breath, wheezing and stridor.  Cardiovascular: Negative for chest pain, palpitations and leg swelling.  Gastrointestinal: Negative for nausea, vomiting, abdominal pain, diarrhea, constipation, blood in stool, abdominal distention and anal bleeding.  Genitourinary: Negative for dysuria, hematuria, flank pain and difficulty urinating.  Musculoskeletal: Negative for myalgias, back pain, joint swelling, arthralgias and gait problem.  Skin: Negative for color change, pallor, rash and wound.  Neurological: Negative for dizziness, tremors, weakness and light-headedness.  Hematological: Negative for adenopathy. Does not bruise/bleed easily.  Psychiatric/Behavioral: Negative for behavioral problems, confusion, sleep disturbance, dysphoric mood, decreased concentration and agitation.   Physical Exam   Bp 127/85, pulse 57. Pulse ox 99% Physical Exam  Constitutional:  oriented to person, place, and time. appears well-developed and well-nourished. No distress.  HENT: [place]/AT, PERRLA, no scleral icterus Mouth/Throat: Oropharynx is clear and moist. No oropharyngeal exudate.  Cardiovascular: Normal rate, regular rhythm and normal heart sounds. Exam reveals no gallop and no friction rub.  No murmur heard.  Pulmonary/Chest: Effort normal and breath sounds normal. No respiratory distress.  has no wheezes.  Neck = supple, no nuchal rigidity Abdominal: Soft. Bowel sounds are normal.  exhibits no distension. There is no tenderness.  Lymphadenopathy: no cervical adenopathy. No axillary adenopathy Neurological: alert and oriented to person, place, and time.  Skin: Skin is warm and dry. No rash noted. No erythema.  Psychiatric: a normal mood and affect.  behavior is normal.    CBC Lab Results  Component Value Date   WBC 3.8 09/24/2021   RBC 4.17 09/24/2021   HGB 12.3 09/24/2021   HCT 36.4 09/24/2021   PLT 235 09/24/2021   MCV 87.3 09/24/2021   MCH 29.5 09/24/2021   MCHC 33.8  09/24/2021   RDW 12.2 09/24/2021   LYMPHSABS 1,440 09/24/2021   MONOABS 0.3 04/17/2021   EOSABS 87 09/24/2021    BMET Lab Results  Component Value Date   NA 136 09/24/2021   K 4.5 09/24/2021   CL 101 09/24/2021   CO2 29 09/24/2021   GLUCOSE 76 09/24/2021   BUN 15 09/24/2021   CREATININE 0.67 09/24/2021   CALCIUM 9.6 09/24/2021   GFRNONAA >60 11/27/2020   GFRAA >60 09/19/2019      Assessment and Plan Hx of pji of right knee = Will check labs today, and plan stop doxycycline to see back in 3 months to see how she is doing off of abtx  Thrush? Mucosal discomfort = may improve with cessation of oral abtx

## 2022-04-12 LAB — C-REACTIVE PROTEIN: CRP: 1.1 mg/L (ref <8.0)

## 2022-04-12 LAB — CBC WITH DIFFERENTIAL/PLATELET
Absolute Monocytes: 412 cells/uL (ref 200–950)
Basophils Absolute: 40 cells/uL (ref 0–200)
Basophils Relative: 1 %
Eosinophils Absolute: 92 cells/uL (ref 15–500)
Eosinophils Relative: 2.3 %
HCT: 37.1 % (ref 35.0–45.0)
Hemoglobin: 12.6 g/dL (ref 11.7–15.5)
Lymphs Abs: 1292 cells/uL (ref 850–3900)
MCH: 30.1 pg (ref 27.0–33.0)
MCHC: 34 g/dL (ref 32.0–36.0)
MCV: 88.5 fL (ref 80.0–100.0)
MPV: 9.7 fL (ref 7.5–12.5)
Monocytes Relative: 10.3 %
Neutro Abs: 2164 cells/uL (ref 1500–7800)
Neutrophils Relative %: 54.1 %
Platelets: 233 10*3/uL (ref 140–400)
RBC: 4.19 10*6/uL (ref 3.80–5.10)
RDW: 11.8 % (ref 11.0–15.0)
Total Lymphocyte: 32.3 %
WBC: 4 10*3/uL (ref 3.8–10.8)

## 2022-04-12 LAB — BASIC METABOLIC PANEL
BUN/Creatinine Ratio: 31 (calc) — ABNORMAL HIGH (ref 6–22)
BUN: 18 mg/dL (ref 7–25)
CO2: 30 mmol/L (ref 20–32)
Calcium: 9.8 mg/dL (ref 8.6–10.4)
Chloride: 100 mmol/L (ref 98–110)
Creat: 0.59 mg/dL — ABNORMAL LOW (ref 0.60–1.00)
Glucose, Bld: 86 mg/dL (ref 65–99)
Potassium: 4.5 mmol/L (ref 3.5–5.3)
Sodium: 136 mmol/L (ref 135–146)

## 2022-04-12 LAB — SEDIMENTATION RATE: Sed Rate: 11 mm/h (ref 0–30)

## 2022-05-16 ENCOUNTER — Encounter: Payer: Self-pay | Admitting: Internal Medicine

## 2022-05-20 ENCOUNTER — Ambulatory Visit: Payer: Medicare Other

## 2022-05-20 DIAGNOSIS — E063 Autoimmune thyroiditis: Secondary | ICD-10-CM | POA: Diagnosis not present

## 2022-05-20 DIAGNOSIS — E038 Other specified hypothyroidism: Secondary | ICD-10-CM | POA: Diagnosis not present

## 2022-05-20 DIAGNOSIS — E782 Mixed hyperlipidemia: Secondary | ICD-10-CM | POA: Diagnosis not present

## 2022-05-21 LAB — LIPID PANEL
Chol/HDL Ratio: 2.3 ratio (ref 0.0–4.4)
Cholesterol, Total: 156 mg/dL (ref 100–199)
HDL: 69 mg/dL (ref >39)
LDL Chol Calc (NIH): 76 mg/dL (ref 0–99)
Triglycerides: 49 mg/dL (ref 0–149)
VLDL Cholesterol Cal: 11 mg/dL (ref 5–40)

## 2022-05-21 LAB — APOLIPOPROTEIN B: Apolipoprotein B: 72 mg/dL (ref <90)

## 2022-05-21 LAB — ALT: ALT: 32 IU/L (ref 0–32)

## 2022-05-28 ENCOUNTER — Other Ambulatory Visit: Payer: Self-pay

## 2022-05-28 ENCOUNTER — Encounter: Payer: Self-pay | Admitting: Internal Medicine

## 2022-05-28 NOTE — Progress Notes (Signed)
Error Leatrice Jewels, RMA

## 2022-05-29 ENCOUNTER — Encounter: Payer: Self-pay | Admitting: Internal Medicine

## 2022-05-30 ENCOUNTER — Other Ambulatory Visit: Payer: Self-pay | Admitting: Internal Medicine

## 2022-05-30 MED ORDER — AMOXICILLIN 500 MG PO CAPS: 1000.0000 mg | ORAL_CAPSULE | Freq: Once | ORAL | 0 refills | Status: DC

## 2022-05-31 ENCOUNTER — Other Ambulatory Visit: Payer: Self-pay

## 2022-05-31 MED ORDER — AMOXICILLIN 500 MG PO CAPS: 1000.0000 mg | ORAL_CAPSULE | Freq: Once | ORAL | 0 refills | Status: DC

## 2022-05-31 NOTE — Progress Notes (Signed)
Called pharmacy to confirm they received rx for Amox 500 Mg. Spoke with Anderson Malta, Pharmacist who states system is down and they have not received electronic prescription.  Gave verbal order. Repeated and confirmed prescription before ending call. Leatrice Jewels, RMA

## 2022-06-01 ENCOUNTER — Encounter: Payer: Self-pay | Admitting: Internal Medicine

## 2022-06-03 MED ORDER — AMOXICILLIN 500 MG PO CAPS: ORAL_CAPSULE | ORAL | 0 refills | Status: DC

## 2022-06-07 ENCOUNTER — Encounter: Payer: Self-pay | Admitting: Internal Medicine

## 2022-06-17 ENCOUNTER — Other Ambulatory Visit: Payer: Self-pay

## 2022-06-17 ENCOUNTER — Telehealth: Payer: Self-pay | Admitting: Gastroenterology

## 2022-06-17 ENCOUNTER — Other Ambulatory Visit (INDEPENDENT_AMBULATORY_CARE_PROVIDER_SITE_OTHER): Payer: Medicare Other

## 2022-06-17 DIAGNOSIS — R1030 Lower abdominal pain, unspecified: Secondary | ICD-10-CM | POA: Diagnosis not present

## 2022-06-17 DIAGNOSIS — R6883 Chills (without fever): Secondary | ICD-10-CM

## 2022-06-17 DIAGNOSIS — Z8719 Personal history of other diseases of the digestive system: Secondary | ICD-10-CM | POA: Diagnosis not present

## 2022-06-17 DIAGNOSIS — I700 Atherosclerosis of aorta: Secondary | ICD-10-CM | POA: Diagnosis not present

## 2022-06-17 LAB — CBC WITH DIFFERENTIAL/PLATELET
Basophils Absolute: 0.1 10*3/uL (ref 0.0–0.1)
Basophils Relative: 0.8 % (ref 0.0–3.0)
Eosinophils Absolute: 0.1 10*3/uL (ref 0.0–0.7)
Eosinophils Relative: 1.1 % (ref 0.0–5.0)
HCT: 37.7 % (ref 36.0–46.0)
Hemoglobin: 12.8 g/dL (ref 12.0–15.0)
Lymphocytes Relative: 21.5 % (ref 12.0–46.0)
Lymphs Abs: 1.5 10*3/uL (ref 0.7–4.0)
MCHC: 34 g/dL (ref 30.0–36.0)
MCV: 86.7 fl (ref 78.0–100.0)
Monocytes Absolute: 0.5 10*3/uL (ref 0.1–1.0)
Monocytes Relative: 7 % (ref 3.0–12.0)
Neutro Abs: 4.7 10*3/uL (ref 1.4–7.7)
Neutrophils Relative %: 69.6 % (ref 43.0–77.0)
Platelets: 243 10*3/uL (ref 150.0–400.0)
RBC: 4.34 Mil/uL (ref 3.87–5.11)
RDW: 13.1 % (ref 11.5–15.5)
WBC: 6.8 10*3/uL (ref 4.0–10.5)

## 2022-06-17 LAB — SEDIMENTATION RATE: Sed Rate: 39 mm/hr — ABNORMAL HIGH (ref 0–30)

## 2022-06-17 LAB — BASIC METABOLIC PANEL
BUN: 5 mg/dL — ABNORMAL LOW (ref 6–23)
CO2: 28 mEq/L (ref 19–32)
Calcium: 9.5 mg/dL (ref 8.4–10.5)
Chloride: 94 mEq/L — ABNORMAL LOW (ref 96–112)
Creatinine, Ser: 0.58 mg/dL (ref 0.40–1.20)
GFR: 90.97 mL/min (ref >60.00)
Glucose, Bld: 93 mg/dL (ref 70–99)
Potassium: 4.1 mEq/L (ref 3.5–5.1)
Sodium: 129 mEq/L — ABNORMAL LOW (ref 135–145)

## 2022-06-17 LAB — HIGH SENSITIVITY CRP: CRP, High Sensitivity: 130.91 mg/L — ABNORMAL HIGH (ref 0.000–5.000)

## 2022-06-17 NOTE — Telephone Encounter (Signed)
Patient called to request to speak with a nurse she said she is been having a lot health issues and she she think she have diverticulitis and she is currently in a liquid diet and want to know if its safe for her to wait until 3/14 to be seen , please advise

## 2022-06-17 NOTE — Telephone Encounter (Addendum)
Orders received and entered. Explained the plan to the patient. Patient agrees to this plan of care.  Patient also wants it know that her WBC count is frequently low and that is her normal.

## 2022-06-17 NOTE — Telephone Encounter (Signed)
DOD Patient of Dr Havery Moros. Spoke with the patient. She also has sent a patient message through My Chart.  She reports she has been off of Doxycycline for 6 weeks. The doxy was prescribed by Dr Baxter Flattery for PJI of the right knee. She stopped taking BID Miralax after stopping Doxycycline because the stools were too soft. This week the patient did not have a bowel movement for 2 days (Thursday and Friday),and began having low abdominal pain, chills, gas and bloating. She restarted Miralax on Saturday. She started a soft diet and increased her liquids. She continues to have low abdominal pain, and hurts to stand up or walk. Temp is 98.3. Her appointment is Thursday. Please advise.

## 2022-06-18 ENCOUNTER — Telehealth: Payer: Self-pay | Admitting: Gastroenterology

## 2022-06-18 ENCOUNTER — Encounter: Payer: Self-pay | Admitting: Internal Medicine

## 2022-06-18 MED ORDER — CIPROFLOXACIN HCL 500 MG PO TABS: 500.0000 mg | ORAL_TABLET | Freq: Two times a day (BID) | ORAL | 0 refills | Status: DC

## 2022-06-18 MED ORDER — METRONIDAZOLE 500 MG PO TABS: 500.0000 mg | ORAL_TABLET | Freq: Three times a day (TID) | ORAL | 0 refills | Status: DC

## 2022-06-18 NOTE — Telephone Encounter (Signed)
The pt has been advised of the results and will begin abx as prescribed.  She will continue to hydrate and keep f/u appt as scheduled. She will also proceed with CT     Labs reviewed and demonstrate normal CBC, normal renal function but hyponatremia (129).  Elevated inflammatory markers with ESR 39 and CRP 130, consistent with active inflammation.  Based on her clinical history and recent symptoms, reasonable chance that she has acute diverticulitis and I recommend treating with Cipro/Flagyl as below while we await results from the CT.  I will loop in her primary Gastroenterologist, Dr. Havery Moros, along with Nicoletta Ba PA-C whom she is scheduled to see later this week.   - Ciprofloxacin 500 mg p.o. twice daily x 10 days - Flagyl 500 mg p.o. 3 times daily x 10 days - Start probiotic now and continue at least 4 weeks beyond completion of antibiotic course - Keep follow-up appointment in the GI clinic this week as scheduled - Continue adequate hydration

## 2022-06-18 NOTE — Telephone Encounter (Signed)
Inbound call from pt she want to speak with a nurse regarding Lab results from yesterday .Please advise

## 2022-06-18 NOTE — Telephone Encounter (Signed)
See alternate phone note dated 3/12

## 2022-06-20 ENCOUNTER — Ambulatory Visit (INDEPENDENT_AMBULATORY_CARE_PROVIDER_SITE_OTHER): Payer: Medicare Other | Admitting: Physician Assistant

## 2022-06-20 ENCOUNTER — Encounter: Payer: Self-pay | Admitting: Physician Assistant

## 2022-06-20 VITALS — BP 110/70 | HR 55 | Ht 64.0 in | Wt 135.0 lb

## 2022-06-20 DIAGNOSIS — Z1211 Encounter for screening for malignant neoplasm of colon: Secondary | ICD-10-CM | POA: Diagnosis not present

## 2022-06-20 DIAGNOSIS — K5792 Diverticulitis of intestine, part unspecified, without perforation or abscess without bleeding: Secondary | ICD-10-CM | POA: Diagnosis not present

## 2022-06-20 NOTE — Patient Instructions (Addendum)
_______________________________________________________  If your blood pressure at your visit was 140/90 or greater, please contact your primary care physician to follow up on this.  _______________________________________________________  If you are age 72 or older, your body mass index should be between 23-30. Your Body mass index is 23.17 kg/m. If this is out of the aforementioned range listed, please consider follow up with your Primary Care Provider.  If you are age 56 or younger, your body mass index should be between 19-25. Your Body mass index is 23.17 kg/m. If this is out of the aformentioned range listed, please consider follow up with your Primary Care Provider.   You have been scheduled for a colonoscopy. Please follow written instructions given to you at your visit today.  Please pick up your prep supplies at the pharmacy within the next 1-3 days. If you use inhalers (even only as needed), please bring them with you on the day of your procedure.   Finish 10 days of Ciprofloxin and Flagyl. Take these medications with food.  Take Florastor twice daily for four weeks.  Use Miralax 1/2 -1 dose daily as needed.   The [facility] providers would like to encourage you to use [facility] to communicate with providers for non-urgent requests or questions.  Due to long hold times on the telephone, sending your provider a message by [facility] may be a faster and more efficient way to get a response.  Please allow 48 business hours for a response.  Please remember that this is for non-urgent requests.   It was a pleasure to see you today!  Thank you for trusting me with your gastrointestinal care!

## 2022-06-20 NOTE — Progress Notes (Signed)
Agree with assessment and plan as outlined.  

## 2022-06-20 NOTE — Progress Notes (Signed)
Subjective:    Patient ID: Crystal Russell, female    DOB: 03/13/1951, 72 y.o.   MRN: [id number]  HPI Crystal Russell is a 72 year old white female, established with Dr. Havery Moros.  She has history of recurrent diverticulitis, with last episode over 2 years ago.  Also with history of atrial fibrillation, PSVT, PFO, and hypothyroidism. Last colonoscopy had been done in 2010 while she was living in [place] and at that time had noted  diverticulosis.  When she was last seen in the office in 2022 plan was for repeat colonoscopy however she then developed septic arthritis of her knee, required hospitalization and multiple rounds of antibiotics.  At 1 point during that course she had developed abdominal pain which was concerning for diverticulitis but CT scan in August 2022 did not show any active diverticulitis.  She then also developed dark stools had C. difficile tested and was C. difficile positive.  Initially recommendation was to be treated with vancomycin however she had been following with Dr. Graylon Good of ID and she felt that she was likely colonized with C. difficile in the setting of numerous antibiotics and should not be treated. Patient says that she was able to finally come off of antibiotics about 7 weeks ago and had been on doxycycline for many many many months prior to that time.  She says that she had always been constipated but while being on all of the antibiotics she was able to have more regular bowel movements. Since stopping the doxycycline,she also stopped prn Miralax, and was having more formed stools.  However last week she became constipated and did not have a bowel movement for couple of days and then developed low mid pelvic abdominal pain which persisted.  This was reminiscent of prior episodes of diverticulitis.  She called the office and recommendation was to start Cipro and Flagyl 500 mg each twice daily x 10 days and a probiotic for 4 weeks.  Labs were also done and she was scheduled for CT  of the abdomen pelvis which unfortunately his not to be done until 06/26/2022. Labs on 06/17/2022 showed CRP of 130.9/sed rate 39 sodium 129/potassium 4.1 creatinine 0.58 and CBC was within normal limits. She has not had any fever or chills, says her appetite has been decreased and she really put herself on liquids over the past few days.  She is already feeling better on the antibiotics, no ongoing constant pain but says she is uncomfortable with urinating and walking though the pain is not severe.  She has not had any melena or hematochezia.  She started MiraLAX back 3 to 4 days ago and is having bowel movements.  Review of Systems Pertinent positive and negative review of systems were noted in the above HPI section.  All other review of systems was otherwise negative.   Outpatient Encounter Medications as of 72/14/2024  Medication Sig   acetaminophen (TYLENOL) 500 MG tablet Take 1,000 mg by mouth as needed.   Alirocumab (PRALUENT) 75 MG/ML SOAJ Inject 75 mg into the skin every 14 (fourteen) days.   amoxicillin (AMOXIL) 500 MG capsule Take a total of 2 grams one hour before dental procedure   aspirin EC 81 MG tablet Take 81 mg by mouth daily. Swallow whole.   atorvastatin (LIPITOR) 10 MG tablet Take 0.5 tablets (5 mg total) by mouth daily.   ciprofloxacin (CIPRO) 500 MG tablet Take 1 tablet (500 mg total) by mouth 2 (two) times daily for 10 days.   Coenzyme Q10 300  MG CAPS Take 300 mg by mouth every evening.   ezetimibe (ZETIA) 10 MG tablet Take 1 tablet (10 mg total) by mouth daily.   levothyroxine (SYNTHROID) 88 MCG tablet TAKE ONE TABLET BY MOUTH ONE TIME DAILY   metroNIDAZOLE (FLAGYL) 500 MG tablet Take 1 tablet (500 mg total) by mouth 3 (three) times daily for 10 days.   polyethylene glycol (MIRALAX / GLYCOLAX) 17 g packet Take 17 g by mouth 2 (two) times daily.   No facility-administered encounter medications on file as of 72/14/2024.   Allergies  Allergen Reactions   Ciprofloxacin Other  (See Comments)    Abdominal pain-06/17/22-patient states she is not allergic, no rash or swelling    Crestor [Rosuvastatin] Other (See Comments)    Joint pain w/fibromyalgia   Pravachol [Pravastatin] Other (See Comments)    Joint pain w/fibromyalgia   Cefazolin Rash   Rifampin Rash   Patient Active Problem List   Diagnosis Date Noted   Dry mouth 10/11/2021   Hyponatremia 10/11/2021   PSVT (paroxysmal supraventricular tachycardia) 05/08/2021   Aortic atherosclerosis ([place]) 05/08/2021   Infection of prosthetic right knee joint ([place]) 09/14/2020   PAC (premature atrial contraction) 07/28/2020   Nonrheumatic aortic valve insufficiency 05/01/2020   Mild mitral regurgitation 05/01/2020   PFO (patent foramen ovale) 05/01/2020   Diverticulitis 03/16/2020   Hyperglycemia 03/16/2020   Vertigo 09/14/2019   Arthritis, septic, knee ([place]) 06/16/2017   Postmenopausal bleeding 12/06/2015   Varicose veins of bilateral lower extremities with other complications [id number]   Sun-damaged skin 12/04/2014   Breast mass, left 05/16/2014   Varicose veins of lower extremities with other complications [id number]   Preventative health care 08/08/2013   Hypothyroidism 08/08/2013   Atrophic vaginitis 08/08/2013   Cervical cancer screening 08/05/2013   ETD (eustachian tube dysfunction) 05/26/2013   Depression with anxiety 12/27/2012   Ventral hernia 08/05/2012   Varicose veins 08/05/2012   DJD (degenerative joint disease) 08/05/2012   Constipation 08/05/2012   Hyperlipidemia, mixed 08/05/2012   H/O gestational diabetes mellitus, not currently pregnant 08/05/2012   Chicken pox    Shingles    Paroxysmal A-fib ([place])    Spondylisthesis    Social History   Socioeconomic History   Marital status: Married    Spouse name: Not on file   Number of children: Not on file   Years of education: Not on file   Highest education level: Not on file  Occupational History   Not on file  Tobacco Use   Smoking  status: Former    Years: 4    Types: Cigarettes   Smokeless tobacco: Never  Vaping Use   Vaping Use: Never used  Substance and Sexual Activity   Alcohol use: No   Drug use: No   Sexual activity: Yes  Other Topics Concern   Not on file  Social History Narrative   Not on file   Social Determinants of Health   Financial Resource Strain: Not on file  Food Insecurity: Not on file  Transportation Needs: Not on file  Physical Activity: Not on file  Stress: Not on file  Social Connections: Not on file  Intimate Partner Violence: Not on file    Ms. Radin's family history includes Asthma in her son and son; Cancer in her father and mother; Dementia in her maternal grandmother; Heart attack in her maternal grandfather and paternal uncle; Liver disease in her mother; Pancreatic cancer in an other family member; Peripheral Artery Disease in her sister; Stomach cancer in  an other family member; Ulcerative colitis in her son and son.      Objective:    Vitals:   06/20/22 0958  BP: 110/70  Pulse: (!) 55  SpO2: 98%    Physical Exam Well-developed well-nourished older white female in no acute distress.  Accompanied by husband height, Weight 135, BMI 23.1  HEENT; nontraumatic normocephalic, EOMI, PE R LA, sclera anicteric. Oropharynx; not examined today Neck; supple, no JVD Cardiovascular; regular rate and rhythm with S1-S2, no murmur rub or gallop Pulmonary; Clear bilaterally Abdomen; soft,  nondistended, there is some mild tenderness in the suprapubic low mid abdomen, no guarding or rebound, 1 small loop of palpable bowel no palpable mass or hepatosplenomegaly, bowel sounds are active Rectal; not done today Skin; benign exam, no jaundice rash or appreciable lesions Extremities; no clubbing cyanosis or edema skin warm and dry Neuro/Psych; alert and oriented x4, grossly nonfocal mood and affect appropriate        Assessment & Plan:   #29 72 year old white female with history of  diverticulitis, who called on 3/11 with recurrent symptoms of low mid abdominal pain, cramping, some increased pain with walking etc.  This occurred in the setting of being constipated for a few days prior. She was started on oral Cipro and Flagyl, had labs done as outlined above which did show significant elevation in CRP and sed rate of 39. She is already improved on antibiotics and symptoms have settled down quite a bit. CT was scheduled at the time that she called however not able to be done until 06/26/2022.  On exam she does have tenderness in the suprapubic area no guarding or rebound, exam reassuring.  I think her diverticulitis is already significantly improved.  #2 colon cancer screening-last colonoscopy 2010, overdue for follow-up. #3 history of colonization with C. difficile has never had active C. difficile infection. #4 history of septic knee 2022 patient took antibiotics for the last year and a half and just stopped doxycycline about 7 weeks ago #5 atrial fibrillation 6.  History of PSVT 7.  PFO 8.  Hypothyroidism 9.  Hyperlipidemia  Plan; patient advised to gradually advance her diet over the next few days, Complete 10-day course of Cipro 500 mg twice daily and metronidazole 500 mg p.o. twice daily, advised to take both with food. Continue Florastor x 4 weeks. I have asked her to call back on Monday, 06/23/2022-if her symptoms have completely resolved we can cancel the CT scan.  If she is having persistent symptoms would plan to proceed with CT and may also want to extend out the course of antibiotics another for 5 days. Continue MiraLAX 17 g in 8 ounces of water daily, or q. OD if causing stools to be too loose. She knows to call if she has any worsening of symptoms. We also discussed colonoscopy.  She is overdue and would like to go ahead and be scheduled.  Will schedule for colonoscopy with Dr. Havery Moros.  Procedure was discussed in detail with the patient including  indications risk and benefits and she is agreeable to proceed.  We will intentionally schedule this out at least 5 to 6 weeks from now to allow complete resolution of diverticulitis.  Crystal Russell S Gisel Vipond PA-C 06/20/2022   Cc: Mosie Lukes, MD

## 2022-06-26 ENCOUNTER — Ambulatory Visit (HOSPITAL_BASED_OUTPATIENT_CLINIC_OR_DEPARTMENT_OTHER)
Admission: RE | Admit: 2022-06-26 | Discharge: 2022-06-26 | Disposition: A | Discharge status: Home / Self Care | Payer: Medicare Other | Source: Ambulatory Visit | Attending: Gastroenterology | Admitting: Gastroenterology

## 2022-06-26 DIAGNOSIS — R6883 Chills (without fever): Secondary | ICD-10-CM | POA: Diagnosis not present

## 2022-06-26 DIAGNOSIS — R109 Unspecified abdominal pain: Secondary | ICD-10-CM | POA: Diagnosis not present

## 2022-06-26 DIAGNOSIS — I700 Atherosclerosis of aorta: Secondary | ICD-10-CM | POA: Diagnosis not present

## 2022-06-26 DIAGNOSIS — R1030 Lower abdominal pain, unspecified: Secondary | ICD-10-CM

## 2022-06-26 DIAGNOSIS — Z8719 Personal history of other diseases of the digestive system: Secondary | ICD-10-CM | POA: Diagnosis not present

## 2022-06-26 MED ORDER — IOHEXOL 300 MG/ML  SOLN
80.0000 mL | Freq: Once | INTRAMUSCULAR | Status: AC | PRN
  Administered 2022-06-26: 80 mL via INTRAVENOUS

## 2022-06-27 ENCOUNTER — Other Ambulatory Visit: Payer: Self-pay

## 2022-06-27 ENCOUNTER — Other Ambulatory Visit (INDEPENDENT_AMBULATORY_CARE_PROVIDER_SITE_OTHER): Payer: Medicare Other

## 2022-06-27 ENCOUNTER — Telehealth: Payer: Self-pay | Admitting: Physician Assistant

## 2022-06-27 DIAGNOSIS — R109 Unspecified abdominal pain: Secondary | ICD-10-CM

## 2022-06-27 DIAGNOSIS — I700 Atherosclerosis of aorta: Secondary | ICD-10-CM

## 2022-06-27 DIAGNOSIS — K5792 Diverticulitis of intestine, part unspecified, without perforation or abscess without bleeding: Secondary | ICD-10-CM

## 2022-06-27 LAB — SEDIMENTATION RATE: Sed Rate: 38 mm/hr — ABNORMAL HIGH (ref 0–30)

## 2022-06-27 LAB — HIGH SENSITIVITY CRP: CRP, High Sensitivity: 43.46 mg/L — ABNORMAL HIGH (ref 0.000–5.000)

## 2022-06-27 MED ORDER — METRONIDAZOLE 500 MG PO TABS: 500.0000 mg | ORAL_TABLET | Freq: Three times a day (TID) | ORAL | 0 refills | Status: AC

## 2022-06-27 MED ORDER — CIPROFLOXACIN HCL 500 MG PO TABS: 500.0000 mg | ORAL_TABLET | Freq: Two times a day (BID) | ORAL | 0 refills | Status: AC

## 2022-06-27 NOTE — Telephone Encounter (Signed)
Spoke with the patient and advised the plan of care.  Cirigliano, Vito V, DO  (12:12 PM)    CT results reviewed and demonstrate acute uncomplicated sigmoid diverticulitis without perforation or abscess.  Was seen by Nicoletta Ba on 3/14 and prescribed (continued) Cipro/Flagyl x 10 days.  Should still be taking that currently.  Based on CT results, okay to carry this out for a total of 14-day course, and continue Florastor for at least 4 weeks beyond completion of antibiotics.  Additionally, since she still has active GI symptoms, can repeat ESR/CRP as a surrogate marker of response to therapy.    Pharmacy confirmed.

## 2022-06-27 NOTE — Telephone Encounter (Signed)
Patient called again regarding CT results. Please advise.

## 2022-06-27 NOTE — Telephone Encounter (Signed)
  Patient of Dr Havery Moros, last seen by Huel Cote, [facility] for diverticulitis. She was started on Cipro and Flagyl for a 10 course on 06/18/22. She saw Amy on 06/20/22.   "If she is having persistent symptoms would plan to proceed with CT and may also want to extend out the course of antibiotics another for 5 days. "   Patient has eaten applesauce and jello, but this causes pain. She has chosen not to eat because of this. She is afebrile. She has nausea and a headache, and she intends to increase her fluid intake. Her CT report is available. Please advise.

## 2022-06-27 NOTE — Telephone Encounter (Signed)
Patient is calling looking for someone to go over Cat scan results. Please advise

## 2022-07-03 ENCOUNTER — Encounter: Payer: Self-pay | Admitting: Gastroenterology

## 2022-07-03 ENCOUNTER — Other Ambulatory Visit: Payer: Self-pay

## 2022-07-03 MED ORDER — SACCHAROMYCES BOULARDII 250 MG PO CAPS: 250.0000 mg | ORAL_CAPSULE | Freq: Two times a day (BID) | ORAL | 0 refills | Status: DC

## 2022-07-03 MED ORDER — DICYCLOMINE HCL 10 MG PO CAPS: 10.0000 mg | ORAL_CAPSULE | Freq: Three times a day (TID) | ORAL | 1 refills | Status: DC | PRN

## 2022-07-03 NOTE — Telephone Encounter (Signed)
PT calling for an update

## 2022-07-15 ENCOUNTER — Ambulatory Visit (INDEPENDENT_AMBULATORY_CARE_PROVIDER_SITE_OTHER): Payer: Medicare Other | Admitting: Internal Medicine

## 2022-07-15 ENCOUNTER — Encounter: Payer: Self-pay | Admitting: Gastroenterology

## 2022-07-15 ENCOUNTER — Other Ambulatory Visit: Payer: Self-pay

## 2022-07-15 ENCOUNTER — Encounter: Payer: Self-pay | Admitting: Internal Medicine

## 2022-07-15 VITALS — BP 128/78 | HR 57 | Temp 97.9°F | Wt 135.0 lb

## 2022-07-15 DIAGNOSIS — Z8719 Personal history of other diseases of the digestive system: Secondary | ICD-10-CM | POA: Diagnosis not present

## 2022-07-15 NOTE — Progress Notes (Signed)
RFV: follow up for chronic suppression of pji  Patient ID: Crystal Russell, female   DOB: 1951/01/24, 72 y.o.   MRN: [id number]  HPI  Did stop doxycycline in January Since then had episode of diverticulitis Intermittent constipation since Thursday, feeling nauseated from constipation  No issues with her knee Outpatient Encounter Medications as of 07/15/2022  Medication Sig   acetaminophen (TYLENOL) 500 MG tablet Take 1,000 mg by mouth as needed.   aspirin EC 81 MG tablet Take 81 mg by mouth daily. Swallow whole.   atorvastatin (LIPITOR) 10 MG tablet Take 0.5 tablets (5 mg total) by mouth daily.   Coenzyme Q10 300 MG CAPS Take 300 mg by mouth every evening.   ezetimibe (ZETIA) 10 MG tablet Take 1 tablet (10 mg total) by mouth daily.   levothyroxine (SYNTHROID) 75 MCG tablet Take 75 mcg by mouth daily.   polyethylene glycol (MIRALAX / GLYCOLAX) 17 g packet Take 17 g by mouth 2 (two) times daily.   saccharomyces boulardii (FLORASTOR) 250 MG capsule Take 1 capsule (250 mg total) by mouth 2 (two) times daily.   Alirocumab (PRALUENT) 75 MG/ML SOAJ Inject 75 mg into the skin every 14 (fourteen) days. (Patient not taking: Reported on 07/15/2022)   amoxicillin (AMOXIL) 500 MG capsule Take a total of 2 grams one hour before dental procedure (Patient not taking: Reported on 07/15/2022)   dicyclomine (BENTYL) 10 MG capsule Take 1 capsule (10 mg total) by mouth 3 (three) times daily as needed for spasms. (Patient not taking: Reported on 07/15/2022)   levothyroxine (SYNTHROID) 88 MCG tablet TAKE ONE TABLET BY MOUTH ONE TIME DAILY (Patient not taking: Reported on 07/15/2022)   No facility-administered encounter medications on file as of 07/15/2022.     Patient Active Problem List   Diagnosis Date Noted   Dry mouth 10/11/2021   Hyponatremia 10/11/2021   PSVT (paroxysmal supraventricular tachycardia) 05/08/2021   Aortic atherosclerosis 05/08/2021   Infection of prosthetic right knee joint 09/14/2020   PAC  (premature atrial contraction) 07/28/2020   Nonrheumatic aortic valve insufficiency 05/01/2020   Mild mitral regurgitation 05/01/2020   PFO (patent foramen ovale) 05/01/2020   Diverticulitis 03/16/2020   Hyperglycemia 03/16/2020   Vertigo 09/14/2019   Arthritis, septic, knee 06/16/2017   Postmenopausal bleeding 12/06/2015   Varicose veins of bilateral lower extremities with other complications 09/11/2015   Sun-damaged skin 12/04/2014   Breast mass, left 05/16/2014   Varicose veins of lower extremities with other complications 12/08/2013   Preventative health care 08/08/2013   Hypothyroidism 08/08/2013   Atrophic vaginitis 08/08/2013   Cervical cancer screening 08/05/2013   ETD (eustachian tube dysfunction) 05/26/2013   Depression with anxiety 12/27/2012   Ventral hernia 08/05/2012   Varicose veins 08/05/2012   DJD (degenerative joint disease) 08/05/2012   Constipation 08/05/2012   Hyperlipidemia, mixed 08/05/2012   H/O gestational diabetes mellitus, not currently pregnant 08/05/2012   Chicken pox    Shingles    Paroxysmal A-fib    Spondylisthesis      Health Maintenance Due  Topic Date Due   Medicare Annual Wellness (AWV)  Never done   Zoster Vaccines- Shingrix (1 of 2) Never done   COLONOSCOPY (Pts 45-36yrs Insurance coverage will need to be confirmed)  07/15/2018   COVID-19 Vaccine (5 - 2023-24 season) 12/07/2021     Review of Systems 12 point ros is negative except what is mentioned above Physical Exam   BP 128/78   Pulse (!) 57   Temp 97.9 F (36.6  C) (Oral)   Wt 135 lb (61.2 kg)   SpO2 99%   BMI 23.17 kg/m   Physical Exam  Constitutional:  oriented to person, place, and time. appears well-developed and well-nourished. No distress.  HENT: [place]/AT, PERRLA, no scleral icterus Mouth/Throat: Oropharynx is clear and moist. No oropharyngeal exudate.  Cardiovascular: Normal rate, regular rhythm and normal heart sounds. Exam reveals no gallop and no friction rub.   No murmur heard.  Pulmonary/Chest: Effort normal and breath sounds normal. No respiratory distress.  has no wheezes.  Neck = supple, no nuchal rigidity Abdominal: Soft. Bowel sounds are normal.  exhibits no distension. There is no tenderness.  Lymphadenopathy: no cervical adenopathy. No axillary adenopathy Neurological: alert and oriented to person, place, and time.  Skin: Skin is warm and dry. No rash noted. No erythema.  Psychiatric: a normal mood and affect.  behavior is normal.    CBC Lab Results  Component Value Date   WBC 6.8 06/17/2022   RBC 4.34 06/17/2022   HGB 12.8 06/17/2022   HCT 37.7 06/17/2022   PLT 243.0 06/17/2022   MCV 86.7 06/17/2022   MCH 30.1 04/11/2022   MCHC 34.0 06/17/2022   RDW 13.1 06/17/2022   LYMPHSABS 1.5 06/17/2022   MONOABS 0.5 06/17/2022   EOSABS 0.1 06/17/2022    BMET Lab Results  Component Value Date   NA 129 (L) 06/17/2022   K 4.1 06/17/2022   CL 94 (L) 06/17/2022   CO2 28 06/17/2022   GLUCOSE 93 06/17/2022   BUN 5 (L) 06/17/2022   CREATININE 0.58 06/17/2022   CALCIUM 9.5 06/17/2022   GFRNONAA >60 11/27/2020   GFRAA >60 09/19/2019   Lab Results  Component Value Date   ESRSEDRATE 22 07/15/2022   Lab Results  Component Value Date   CRP 2.8 07/15/2022      Assessment and Plan Hx of remote pji = Will check sed rate and crp Pji suspect is stable; no need for other abtx for chronic suppression  Constipation = recommend to continue miralax, and can add bisacodyl  to see if that improves symptoms  I have personally spent 30 minutes involved in face-to-face and non-face-to-face activities for this patient on the day of the visit. Professional time spent includes the following activities: Preparing to see the patient (review of tests), Obtaining and/or reviewing separately obtained history (admission/discharge record), Performing a medically appropriate examination and/or evaluation , Ordering medications/tests/procedures, referring  and communicating with other health care professionals, Documenting clinical information in the EMR, Independently interpreting results (not separately reported), Communicating results to the patient/family/caregiver, Counseling and educating the patient/family/caregiver and Care coordination (not separately reported).

## 2022-07-16 ENCOUNTER — Telehealth: Payer: Self-pay

## 2022-07-16 LAB — SEDIMENTATION RATE: Sed Rate: 22 mm/h (ref 0–30)

## 2022-07-16 LAB — C-REACTIVE PROTEIN: CRP: 2.8 mg/L (ref <8.0)

## 2022-07-16 NOTE — Telephone Encounter (Signed)
Beth I am just seeing this. I sent her a Mychart message back.   Can you check on her Wed AM - she can increase Miralax to BID if needed. If nauseated can give some Zofran. Not sure if she is using Bentyl at all. Her labs look okay but if she is feeling worse could consider another course of antibiotics or repeat CT if her pain is really bothering her to rule out complications from diverticulitis.  Can you let me know how she is doing? thanks

## 2022-07-16 NOTE — Telephone Encounter (Signed)
Dr Adela Lank  Please advise. Amy is working in the hospital. The patient is scheduled for her colonoscopy 08/08/22.  [name]  P [facility] Clinical Pool (supporting Benancio Deeds, MD)16 hours ago (4:42 PM)    I have been trying to introduce food. Now I have a hard time moving my bowels. I started miralax  1x day and moved a bit yesterday nothing today. I am nauseated , my throat burns the past week almost everything I eat and pelvic pain. O stayed away from all fresh fruit and veggies.  My plan is to start back on liquids only. I had an appt today with Dr.Snyder and she did crp and sed rate. Not back yet. My pain is tolerable although I am resting only. I do not feel I am emergent but not feeling well. Can you please advise me how to go forward? Thank you [name]

## 2022-07-17 NOTE — Telephone Encounter (Signed)
Okay, thanks for the update. Would continue MiraLAX to keep her bowels moving. If she needs something for nausea we can give Zofran. Stay on a liquid diet for another few days if she is feeling better on that regimen. If she has burning in her throat she could have reflux, may want to try her empirically on some omeprazole 20 mg daily. Again I think Bentyl is safe for her to take for short courses for pain. If she is concerned about this and not getting better we can do a CT scan abdomen pelvis.  Thanks

## 2022-07-17 NOTE — Telephone Encounter (Signed)
Patient is advised and she agrees to this plan. She will send a message in 2 days through My Chart (to [facility]) and give [place] an update.

## 2022-07-17 NOTE — Telephone Encounter (Signed)
Spoke with patient. She has not eaten solid food in 2 days. She is only taking PO fluids. Avoiding solid foods "has stopped my pain."  When she eats, she describes a "burning in my throat." She will also be nauseated if she eats solid foods.  She is drinking Miralax twice daily and reports this has helped her to move her bowels. She has not used Bentyl and does not intend to due to the potential side effects. She is hesitant to repeat antibiotics because her eye twitched while taking the last round and stopped twitching when she completed the antibiotics.  She is agreeable to considering the CT and hopes this would be done more quickly than last time. (It took 10 days to get her CT the last time) She tells me she does not feel well, she has remained in the bed for the most part, she is weak and she is tired of feeling this way.

## 2022-07-19 ENCOUNTER — Telehealth: Payer: Self-pay | Admitting: Family Medicine

## 2022-07-19 ENCOUNTER — Other Ambulatory Visit: Payer: Self-pay

## 2022-07-19 MED ORDER — ONDANSETRON 4 MG PO TBDP: 4.0000 mg | ORAL_TABLET | Freq: Four times a day (QID) | ORAL | 0 refills | Status: DC | PRN

## 2022-07-19 NOTE — Telephone Encounter (Signed)
See messages w/ GI. Will defer to them.

## 2022-07-19 NOTE — Telephone Encounter (Signed)
Pt called concerned about the lack of improvement of her symptoms going in to the weekend. She had CT scan and it showed evidence of blockage and she was diagnosed with acute diverticulitis. Pt said she began eating and then felt queasy, gassy, heartburn and constipated so she went back to clear liquids and she has been on those since Monday. Pt said she is losing a pound a day and she's exhausted but not sure what to do. Pt is aware of messages from Dr. Sallee Provencal office but they are only communicating via mychart so she isn't sure if she should just go to the ED or not. Please call to advise.

## 2022-07-22 ENCOUNTER — Encounter: Payer: Self-pay | Admitting: Gastroenterology

## 2022-08-01 ENCOUNTER — Encounter: Payer: Self-pay | Admitting: Gastroenterology

## 2022-08-08 ENCOUNTER — Encounter: Payer: Self-pay | Admitting: Gastroenterology

## 2022-08-08 ENCOUNTER — Ambulatory Visit (AMBULATORY_SURGERY_CENTER): Payer: Medicare Other | Admitting: Gastroenterology

## 2022-08-08 VITALS — BP 122/72 | HR 48 | Temp 97.8°F | Resp 9 | Ht 64.0 in | Wt 135.0 lb

## 2022-08-08 DIAGNOSIS — F418 Other specified anxiety disorders: Secondary | ICD-10-CM | POA: Diagnosis not present

## 2022-08-08 DIAGNOSIS — I480 Paroxysmal atrial fibrillation: Secondary | ICD-10-CM | POA: Diagnosis not present

## 2022-08-08 DIAGNOSIS — K5792 Diverticulitis of intestine, part unspecified, without perforation or abscess without bleeding: Secondary | ICD-10-CM

## 2022-08-08 DIAGNOSIS — E039 Hypothyroidism, unspecified: Secondary | ICD-10-CM | POA: Diagnosis not present

## 2022-08-08 DIAGNOSIS — Z8719 Personal history of other diseases of the digestive system: Secondary | ICD-10-CM | POA: Diagnosis not present

## 2022-08-08 MED ORDER — SODIUM CHLORIDE 0.9 % IV SOLN: 500.0000 mL | Freq: Once | INTRAVENOUS | Status: DC

## 2022-08-08 NOTE — Patient Instructions (Signed)
Resume all of you previous medications as ordered.  Your colon was very tortuous: therefore, you might have a sore abdomen on the l side.  If it gets very painful, call us.  YOU HAD AN ENDOSCOPIC PROCEDURE TODAY AT THE [facility]:   Refer to the procedure report that was given to you for any specific questions about what was found during the examination.  If the procedure report does not answer your questions, please call your gastroenterologist to clarify.  If you requested that your care partner not be given the details of your procedure findings, then the procedure report has been included in a sealed envelope for you to review at your convenience later.  YOU SHOULD EXPECT: Some feelings of bloating in the abdomen. Passage of more gas than usual.  Walking can help get rid of the air that was put into your GI tract during the procedure and reduce the bloating. If you had a lower endoscopy (such as a colonoscopy or flexible sigmoidoscopy) you may notice spotting of blood in your stool or on the toilet paper. If you underwent a bowel prep for your procedure, you may not have a normal bowel movement for a few days.  Please Note:  You might notice some irritation and congestion in your nose or some drainage.  This is from the oxygen used during your procedure.  There is no need for concern and it should clear up in a day or so.  SYMPTOMS TO REPORT IMMEDIATELY:  Following lower endoscopy (colonoscopy or flexible sigmoidoscopy):  Excessive amounts of blood in the stool  Significant tenderness or worsening of abdominal pains  Swelling of the abdomen that is new, acute  Fever of 100F or higher  For urgent or emergent issues, a gastroenterologist can be reached at any hour by calling [phone]. Do not use MyChart messaging for urgent concerns.    DIET:  We do recommend a small meal at first, but then you may proceed to your regular diet.  Drink plenty of fluids but you should avoid  alcoholic beverages for 24 hours.  Try to eat a high fiber dit, and drink plenty of water.  ACTIVITY:  You should plan to take it easy for the rest of today and you should NOT DRIVE or use heavy machinery until tomorrow (because of the sedation medicines used during the test).    FOLLOW UP: Our staff will call the number listed on your records the next business day following your procedure.  We will call around 7:15- 8:00 am to check on you and address any questions or concerns that you may have regarding the information given to you following your procedure. If we do not reach you, we will leave a message.      SIGNATURES/CONFIDENTIALITY: You and/or your care partner have signed paperwork which will be entered into your electronic medical record.  These signatures attest to the fact that that the information above on your After Visit Summary has been reviewed and is understood.  Full responsibility of the confidentiality of this discharge information lies with you and/or your care-partner.

## 2022-08-08 NOTE — Op Note (Signed)
[facility] Patient Name: Crystal Russell Procedure Date: 08/08/2022 11:06 AM MRN: [id number] Endoscopist: Viviann Spare P. Adela Lank , MD, [id number] Age: 72 Referring MD:  Date of Birth: 1950-10-24 Gender: Female Account #: [id number] Procedure:                Colonoscopy Indications:              history of recurrent diverticulitis, last                            colonoscopy 2010 Medicines:                Monitored Anesthesia Care Procedure:                Pre-Anesthesia Assessment:                           - Prior to the procedure, a History and Physical                            was performed, and patient medications and                            allergies were reviewed. The patient's tolerance of                            previous anesthesia was also reviewed. The risks                            and benefits of the procedure and the sedation                            options and risks were discussed with the patient.                            All questions were answered, and informed consent                            was obtained. Prior Anticoagulants: The patient has                            taken no anticoagulant or antiplatelet agents. ASA                            Grade Assessment: II - A patient with mild systemic                            disease. After reviewing the risks and benefits,                            the patient was deemed in satisfactory condition to                            undergo the procedure.  After obtaining informed consent, the colonoscope                            was passed under direct vision. Throughout the                            procedure, the patient's blood pressure, pulse, and                            oxygen saturations were monitored continuously. The                            [id number]-[id number] Slim SN [id number] was introduced through                            the anus and advanced to the the cecum,  identified                            by appendiceal orifice and ileocecal valve. The                            colonoscopy was technically difficult and complex                            due to a tortuous colon. The patient tolerated the                            procedure well. The quality of the bowel                            preparation was adequate. The ileocecal valve,                            appendiceal orifice, and rectum were photographed. Scope In: 11:24:20 AM Scope Out: 11:52:10 AM Scope Withdrawal Time: 0 hours 13 minutes 16 seconds  Total Procedure Duration: 0 hours 27 minutes 50 seconds  Findings:                 The perianal and digital rectal examinations were                            normal.                           Many medium-mouthed diverticula were found in the                            left colon and right colon. Severe diverticulosis                            in the left colon with some luminal narrowing -                            ultraslim pediatric colonoscope used to traverse  this area.                           The colon was extremely tortuous. There was looping                            using the ultraslim pediatric colonoscope in the                            right / transverse colon, abdominal pressure                            utilized to achieve cecal intubation.                           The exam was otherwise without abnormality. Complications:            No immediate complications. Estimated blood loss:                            None. Estimated Blood Loss:     Estimated blood loss: none. Impression:               - Severe diverticulosis                           - Tortuous colon.                           - The examination was otherwise normal.                           - No polyps.                           - The GI Genius (intelligent endoscopy module),                            computer-aided polyp  detection system powered by AI                            was utilized to detect colorectal polyps through                            enhanced visualization during colonoscopy. Recommendation:           - Patient has a contact number available for                            emergencies. The signs and symptoms of potential                            delayed complications were discussed with the                            patient. Return to normal activities tomorrow.  Written discharge instructions were provided to the                            patient.                           - Resume previous diet.                           - Continue present medications.                           - Advance diet as tolerated                           - Call if recurrent symptoms of diverticulitis                            moving forward                           - No further colonoscopy exams recommended due to                            no polyps on this exam and the patient's age. Viviann Spare P. Kallen Delatorre, MD 08/08/2022 11:59:06 AM This report has been signed electronically.

## 2022-08-08 NOTE — Progress Notes (Signed)
[facility] History and Physical   Primary Care Physician:  Bradd Canary, MD   Reason for Procedure:   History of diverticulitis  Plan:    colonoscopy     HPI: Crystal Russell is a 72 y.o. female  here for colonoscopy  history of left sided diverticulitis. Last colonoscopy done in 2010. Patient has completed course of antibiotics in recent months for diverticulitis. Has intermittent pelvic / LLQ pains. No fevers. Tolerated prep.   Otherwise feels well without any cardiopulmonary symptoms.   I have discussed risks / benefits of anesthesia and endoscopic procedure with [name] and they wish to proceed with the exams as outlined today.    Past Medical History:  Diagnosis Date   Abdominal wall hernia 08/04/2012   Atrial fibrillation (HCC) 2008   REPORTS SHE WAS AT [place], UPON WAKING I FELT MY HEART GOING BOOM BOOM BOOM BEATING OUT OF MY CHEST. I WENT TO HOSPITAL AND THEY ISED MEDICINE TO CONVERT ME , IT TOOK OVER 10 HOURS TO CONVERT ME. BUT AFTER THAT IVE NEVER HAD ANY ISSUES SINCE. I USED TO HAVE A CARDIOLOGUIST AS MY PRIMARY IN [place] BUT IHAVENT BEEN TO A CARDIOLOGIST SINCE I MOVED DOWN HERE.    Chicken pox as a child   Depression    Depression with anxiety 12/27/2012   Diverticulitis    DJD (degenerative joint disease) 08/05/2012   DVT (deep venous thrombosis) (HCC) 2018   Fifth disease 1989   DX AFTER EXPOSURE FROM HER CHILDREN; DEVELOPED RHEUMATOID ARTHRITIS DURING COURSE OF ILLNESS. REPORTS:  " I DONT HAVE IT ANYMORE SINCE THE DISEASE IS GONE"    H/O gestational diabetes mellitus, not currently pregnant 08/05/2012   History of    Heart murmur    echo 2012; "I HAVE A FUNCTIONAL MURMUR"    Hyperlipidemia    Hypothyroidism    Knee effusion, right    while on Eliquis, discontinued Eliquis after DVT resolved   Measles as a child   Migraines    Other and unspecified hyperlipidemia 08/05/2012   Paroxysmal A-fib (HCC)    Shingles 43   Spondylisthesis 2012    L5/S1 grade 3   Varicose veins 08/05/2012   Extending up to right groin B/l LE   Ventral hernia 08/05/2012   Wears glasses     Past Surgical History:  Procedure Laterality Date   BREAST DUCTAL SYSTEM EXCISION Right 05/05/2014   Procedure: RIGHT BREAST DUCT EXCISION;  Surgeon: Emelia Loron, MD;  Location: [facility];  Service: General;  Laterality: Right;   COLONOSCOPY     DILATION AND CURETTAGE OF UTERUS     x4   EXCISIONAL TOTAL KNEE ARTHROPLASTY WITH ANTIBIOTIC SPACERS Right 09/14/2020   Procedure: Irrigation and debriement with poly exchange versus resection of right total knee arthroplasty and placement of antibitiotic spacer;  Surgeon: Durene Romans, MD;  Location: [facility];  Service: Orthopedics;  Laterality: Right;   MOUTH SURGERY     TOTAL KNEE ARTHROPLASTY Right 07/01/2017   Procedure: RIGHT TOTAL KNEE ARTHROPLASTY;  Surgeon: Durene Romans, MD;  Location: [facility];  Service: Orthopedics;  Laterality: Right;  70 mins   VARICOSE VEIN SURGERY  1995   right leg    Prior to Admission medications   Medication Sig Start Date End Date Taking? Authorizing Provider  aspirin EC 81 MG tablet Take 81 mg by mouth daily. Swallow whole.   Yes [provider]  atorvastatin (LIPITOR) 10 MG tablet Take 0.5 tablets (5 mg  total) by mouth daily. 04/09/22  Yes Chandrasekhar, Mahesh A, MD  Coenzyme Q10 300 MG CAPS Take 300 mg by mouth every evening.   Yes [provider]  levothyroxine (SYNTHROID) 75 MCG tablet Take 75 mcg by mouth daily.   Yes [provider]  polyethylene glycol (MIRALAX / GLYCOLAX) 17 g packet Take 17 g by mouth 2 (two) times daily.   Yes [provider]  saccharomyces boulardii (FLORASTOR) 250 MG capsule Take 1 capsule (250 mg total) by mouth 2 (two) times daily. 07/03/22  Yes Esterwood, Amy S, PA-C  acetaminophen (TYLENOL) 500 MG tablet Take 1,000 mg by mouth as needed.    [provider]  Alirocumab (PRALUENT) 75 MG/ML SOAJ  Inject 75 mg into the skin every 14 (fourteen) days. Patient not taking: Reported on 07/15/2022 02/26/22   Christell Constant, MD  amoxicillin (AMOXIL) 500 MG capsule Take a total of 2 grams one hour before dental procedure Patient not taking: Reported on 07/15/2022 06/03/22   Judyann Munson, MD  dicyclomine (BENTYL) 10 MG capsule Take 1 capsule (10 mg total) by mouth 3 (three) times daily as needed for spasms. Patient not taking: Reported on 07/15/2022 07/03/22   Esterwood, Amy S, PA-C  ezetimibe (ZETIA) 10 MG tablet Take 1 tablet (10 mg total) by mouth daily. Patient not taking: Reported on 08/08/2022 11/13/21   Christell Constant, MD  levothyroxine (SYNTHROID) 88 MCG tablet TAKE ONE TABLET BY MOUTH ONE TIME DAILY Patient not taking: Reported on 07/15/2022 10/01/21   Bradd Canary, MD  ondansetron (ZOFRAN-ODT) 4 MG disintegrating tablet Take 1 tablet (4 mg total) by mouth every 6 (six) hours as needed for nausea or vomiting. 07/19/22   Jackquline Branca, Willaim Rayas, MD    Current Outpatient Medications  Medication Sig Dispense Refill   aspirin EC 81 MG tablet Take 81 mg by mouth daily. Swallow whole.     atorvastatin (LIPITOR) 10 MG tablet Take 0.5 tablets (5 mg total) by mouth daily. 45 tablet 2   Coenzyme Q10 300 MG CAPS Take 300 mg by mouth every evening.     levothyroxine (SYNTHROID) 75 MCG tablet Take 75 mcg by mouth daily.     polyethylene glycol (MIRALAX / GLYCOLAX) 17 g packet Take 17 g by mouth 2 (two) times daily.     saccharomyces boulardii (FLORASTOR) 250 MG capsule Take 1 capsule (250 mg total) by mouth 2 (two) times daily. 60 capsule 0   acetaminophen (TYLENOL) 500 MG tablet Take 1,000 mg by mouth as needed.     Alirocumab (PRALUENT) 75 MG/ML SOAJ Inject 75 mg into the skin every 14 (fourteen) days. (Patient not taking: Reported on 07/15/2022) 2 mL 11   amoxicillin (AMOXIL) 500 MG capsule Take a total of 2 grams one hour before dental procedure (Patient not taking: Reported on 07/15/2022) 2  capsule 0   dicyclomine (BENTYL) 10 MG capsule Take 1 capsule (10 mg total) by mouth 3 (three) times daily as needed for spasms. (Patient not taking: Reported on 07/15/2022) 90 capsule 1   ezetimibe (ZETIA) 10 MG tablet Take 1 tablet (10 mg total) by mouth daily. (Patient not taking: Reported on 08/08/2022) 90 tablet 3   levothyroxine (SYNTHROID) 88 MCG tablet TAKE ONE TABLET BY MOUTH ONE TIME DAILY (Patient not taking: Reported on 07/15/2022) 90 tablet 0   ondansetron (ZOFRAN-ODT) 4 MG disintegrating tablet Take 1 tablet (4 mg total) by mouth every 6 (six) hours as needed for nausea or vomiting. 30 tablet 0  Current Facility-Administered Medications  Medication Dose Route Frequency Provider Last Rate Last Admin   0.9 %  sodium chloride infusion  500 mL Intravenous Once Lucendia Leard, Willaim Rayas, MD        Allergies as of 08/08/2022 - Review Complete 08/08/2022  Allergen Reaction Noted   Ciprofloxacin Other (See Comments) 03/29/2021   Crestor [rosuvastatin] Other (See Comments) 05/21/2013   Pravachol [pravastatin] Other (See Comments) 05/21/2013   Cefazolin Rash 10/22/2020   Rifampin Rash 03/29/2021    Family History  Problem Relation Age of Onset   Cancer Mother        lung- smoker   Liver disease Mother    Cancer Father        lung- smoker   Peripheral Artery Disease Sister    Heart attack Paternal Uncle    Dementia Maternal Grandmother    Heart attack Maternal Grandfather    Asthma Son    Ulcerative colitis Son    Asthma Son    Ulcerative colitis Son    Pancreatic cancer Other    Stomach cancer Other    Colon cancer Neg Hx    Cystic fibrosis Neg Hx    Esophageal cancer Neg Hx     Social History   Socioeconomic History   Marital status: Married    Spouse name: Not on file   Number of children: Not on file   Years of education: Not on file   Highest education level: Not on file  Occupational History   Not on file  Tobacco Use   Smoking status: Former    Years: 4     Types: Cigarettes   Smokeless tobacco: Never  Vaping Use   Vaping Use: Never used  Substance and Sexual Activity   Alcohol use: No   Drug use: No   Sexual activity: Yes  Other Topics Concern   Not on file  Social History Narrative   Not on file   Social Determinants of Health   Financial Resource Strain: Not on file  Food Insecurity: Not on file  Transportation Needs: Not on file  Physical Activity: Not on file  Stress: Not on file  Social Connections: Not on file  Intimate Partner Violence: Not on file    Review of Systems: All other review of systems negative except as mentioned in the HPI.  Physical Exam: Vital signs BP (!) 152/82   Pulse (!) 54   Temp 97.8 F (36.6 C)   Resp 14   Ht 5\' 4"  (1.626 m)   Wt 135 lb (61.2 kg)   SpO2 100%   BMI 23.17 kg/m   General:   Alert,  Well-developed, pleasant and cooperative in NAD Lungs:  Clear throughout to auscultation.   Heart:  Regular rate and rhythm Abdomen:  Soft, nontender and nondistended.   Neuro/Psych:  Alert and cooperative. Normal mood and affect. A and O x 3  Harlin Rain, MD [facility]

## 2022-08-08 NOTE — Progress Notes (Signed)
A and O x3. Report to RN. Tolerated MAC anesthesia well. 

## 2022-08-09 ENCOUNTER — Telehealth: Payer: Self-pay

## 2022-08-09 NOTE — Telephone Encounter (Signed)
  Follow up Call-     08/08/2022   10:27 AM  Call back number  Post procedure Call Back phone  # [phone]  Permission to leave phone message Yes     Patient questions:  Do you have a fever, pain , or abdominal swelling? No. Pain Score  0 *  Have you tolerated food without any problems? Yes.    Have you been able to return to your normal activities? Yes.    Do you have any questions about your discharge instructions: Diet   No. Medications  No. Follow up visit  No.  Do you have questions or concerns about your Care? No.  Actions: * If pain score is 4 or above: No action needed, pain <4.

## 2022-09-04 ENCOUNTER — Ambulatory Visit (HOSPITAL_COMMUNITY): Admit: 2022-09-04 | Payer: Medicare Other | Admitting: Gastroenterology

## 2022-09-04 ENCOUNTER — Encounter (HOSPITAL_COMMUNITY): Payer: Self-pay

## 2022-09-04 SURGERY — MANOMETRY, ESOPHAGUS
Anesthesia: Choice

## 2022-10-01 ENCOUNTER — Other Ambulatory Visit: Payer: Self-pay | Admitting: Family Medicine

## 2022-10-01 DIAGNOSIS — Z1231 Encounter for screening mammogram for malignant neoplasm of breast: Secondary | ICD-10-CM

## 2022-10-02 ENCOUNTER — Ambulatory Visit
Admission: RE | Admit: 2022-10-02 | Discharge: 2022-10-02 | Disposition: A | Discharge status: Home / Self Care | Payer: Medicare Other | Source: Ambulatory Visit

## 2022-10-02 DIAGNOSIS — Z1231 Encounter for screening mammogram for malignant neoplasm of breast: Secondary | ICD-10-CM | POA: Diagnosis not present

## 2022-10-03 DIAGNOSIS — H353132 Nonexudative age-related macular degeneration, bilateral, intermediate dry stage: Secondary | ICD-10-CM | POA: Diagnosis not present

## 2022-10-03 DIAGNOSIS — H52223 Regular astigmatism, bilateral: Secondary | ICD-10-CM | POA: Diagnosis not present

## 2022-10-03 DIAGNOSIS — H43812 Vitreous degeneration, left eye: Secondary | ICD-10-CM | POA: Diagnosis not present

## 2022-10-03 DIAGNOSIS — H2513 Age-related nuclear cataract, bilateral: Secondary | ICD-10-CM | POA: Diagnosis not present

## 2022-10-03 DIAGNOSIS — H5203 Hypermetropia, bilateral: Secondary | ICD-10-CM | POA: Diagnosis not present

## 2022-10-03 DIAGNOSIS — H524 Presbyopia: Secondary | ICD-10-CM | POA: Diagnosis not present

## 2022-10-14 ENCOUNTER — Other Ambulatory Visit: Payer: Self-pay

## 2022-10-14 ENCOUNTER — Encounter: Payer: Self-pay | Admitting: Internal Medicine

## 2022-10-14 ENCOUNTER — Ambulatory Visit: Payer: Medicare Other | Admitting: Internal Medicine

## 2022-10-14 VITALS — BP 132/81 | HR 54 | Resp 16 | Ht 64.0 in | Wt 132.0 lb

## 2022-10-14 DIAGNOSIS — T8453XD Infection and inflammatory reaction due to internal right knee prosthesis, subsequent encounter: Secondary | ICD-10-CM | POA: Diagnosis not present

## 2022-10-14 DIAGNOSIS — M009 Pyogenic arthritis, unspecified: Secondary | ICD-10-CM

## 2022-10-14 NOTE — Progress Notes (Signed)
RFV: follow up for chronic pji Patient ID: Crystal Russell, female   DOB: 01/13/1951, 72 y.o.   MRN: [id number]  HPI Crystal Russell is a 72yo F has history of right knee pji. Has been off of chronic suppression since jan 2024. She did have a difficult course of diverticulitis in April-may. Requiring cipro-metronidazole treatment. Now has difficulty with finding balance between constipation-requiring miralax. She recently had colonoscopy found to have-- scar tissue from diverticulitis.    She reports that a few days ago started to have Chills, headache, and diarrhea (maybe too much miralax) , and brat diet now  (lost #15 in may) -- starting to feel improved. Although, she  Did not have any BM yesterday after 2 doses of miralax. Has nausea from constipation.   Felt migrainous with scalp headache.   Right knee not swollen, red. Mild pain  Was bedridden the month of may - deconditioned --- still decreased stamina, deconditioned after 50%   Outpatient Encounter Medications as of 10/14/2022  Medication Sig   acetaminophen (TYLENOL) 500 MG tablet Take 1,000 mg by mouth as needed.   aspirin EC 81 MG tablet Take 81 mg by mouth daily. Swallow whole.   atorvastatin (LIPITOR) 10 MG tablet Take 0.5 tablets (5 mg total) by mouth daily.   Coenzyme Q10 300 MG CAPS Take 300 mg by mouth every evening.   levothyroxine (SYNTHROID) 75 MCG tablet Take 75 mcg by mouth daily.   ondansetron (ZOFRAN-ODT) 4 MG disintegrating tablet Take 1 tablet (4 mg total) by mouth every 6 (six) hours as needed for nausea or vomiting.   polyethylene glycol (MIRALAX / GLYCOLAX) 17 g packet Take 17 g by mouth 2 (two) times daily.   saccharomyces boulardii (FLORASTOR) 250 MG capsule Take 1 capsule (250 mg total) by mouth 2 (two) times daily.   Alirocumab (PRALUENT) 75 MG/ML SOAJ Inject 75 mg into the skin every 14 (fourteen) days. (Patient not taking: Reported on 07/15/2022)   amoxicillin (AMOXIL) 500 MG capsule Take a total of 2 grams one  hour before dental procedure (Patient not taking: Reported on 07/15/2022)   dicyclomine (BENTYL) 10 MG capsule Take 1 capsule (10 mg total) by mouth 3 (three) times daily as needed for spasms. (Patient not taking: Reported on 07/15/2022)   ezetimibe (ZETIA) 10 MG tablet Take 1 tablet (10 mg total) by mouth daily. (Patient not taking: Reported on 08/08/2022)   levothyroxine (SYNTHROID) 88 MCG tablet TAKE ONE TABLET BY MOUTH ONE TIME DAILY (Patient not taking: Reported on 07/15/2022)   Facility-Administered Encounter Medications as of 10/14/2022  Medication   0.9 %  sodium chloride infusion     Patient Active Problem List   Diagnosis Date Noted   Dry mouth 10/11/2021   Hyponatremia 10/11/2021   PSVT (paroxysmal supraventricular tachycardia) 05/08/2021   Aortic atherosclerosis (HCC) 05/08/2021   Infection of prosthetic right knee joint (HCC) 09/14/2020   PAC (premature atrial contraction) 07/28/2020   Nonrheumatic aortic valve insufficiency 05/01/2020   Mild mitral regurgitation 05/01/2020   PFO (patent foramen ovale) 05/01/2020   Diverticulitis 03/16/2020   Hyperglycemia 03/16/2020   Vertigo 09/14/2019   Arthritis, septic, knee (HCC) 06/16/2017   Postmenopausal bleeding 12/06/2015   Varicose veins of bilateral lower extremities with other complications 09/11/2015   Sun-damaged skin 12/04/2014   Breast mass, left 05/16/2014   Varicose veins of lower extremities with other complications 12/08/2013   Preventative health care 08/08/2013   Hypothyroidism 08/08/2013   Atrophic vaginitis 08/08/2013   Cervical cancer screening 08/05/2013  ETD (eustachian tube dysfunction) 05/26/2013   Depression with anxiety 12/27/2012   Ventral hernia 08/05/2012   Varicose veins 08/05/2012   DJD (degenerative joint disease) 08/05/2012   Constipation 08/05/2012   Hyperlipidemia, mixed 08/05/2012   H/O gestational diabetes mellitus, not currently pregnant 08/05/2012   Chicken pox    Shingles    Paroxysmal  A-fib (HCC)    Spondylisthesis      Health Maintenance Due  Topic Date Due   Medicare Annual Wellness (AWV)  Never done   Zoster Vaccines- Shingrix (1 of 2) Never done   COVID-19 Vaccine (5 - 2023-24 season) 12/07/2021     Review of Systems  Physical Exam   BP 132/81   Pulse (!) 54   Resp 16   Ht 5\' 4"  (1.626 m)   Wt 132 lb (59.9 kg)   BMI 22.66 kg/m   Gen = a xo by 3 in nad HEENT = PERRLA, EOMI, no scleral jaundice Ext: knee is well healed no warmth.   CBC Lab Results  Component Value Date   WBC 6.8 06/17/2022   RBC 4.34 06/17/2022   HGB 12.8 06/17/2022   HCT 37.7 06/17/2022   PLT 243.0 06/17/2022   MCV 86.7 06/17/2022   MCH 30.1 04/11/2022   MCHC 34.0 06/17/2022   RDW 13.1 06/17/2022   LYMPHSABS 1.5 06/17/2022   MONOABS 0.5 06/17/2022   EOSABS 0.1 06/17/2022    BMET Lab Results  Component Value Date   NA 129 (L) 06/17/2022   K 4.1 06/17/2022   CL 94 (L) 06/17/2022   CO2 28 06/17/2022   GLUCOSE 93 06/17/2022   BUN 5 (L) 06/17/2022   CREATININE 0.58 06/17/2022   CALCIUM 9.5 06/17/2022   GFRNONAA >60 11/27/2020   GFRAA >60 09/19/2019      Assessment and Plan   Will check inflammatory markers for chronic right pji  Discussed that it maybe elevated given her recent gi symptoms. Suspect that we will continue to monitor off of abtx  Health maintenance = recommend flu and new covid vaccine in the fall

## 2022-10-15 LAB — CBC WITH DIFFERENTIAL/PLATELET
Absolute Monocytes: 329 cells/uL (ref 200–950)
Basophils Absolute: 39 cells/uL (ref 0–200)
Basophils Relative: 1.1 %
Eosinophils Absolute: 102 cells/uL (ref 15–500)
Eosinophils Relative: 2.9 %
HCT: 37 % (ref 35.0–45.0)
Hemoglobin: 12.2 g/dL (ref 11.7–15.5)
Lymphs Abs: 1180 cells/uL (ref 850–3900)
MCH: 29.3 pg (ref 27.0–33.0)
MCHC: 33 g/dL (ref 32.0–36.0)
MCV: 88.9 fL (ref 80.0–100.0)
MPV: 9.4 fL (ref 7.5–12.5)
Monocytes Relative: 9.4 %
Neutro Abs: 1852 cells/uL (ref 1500–7800)
Neutrophils Relative %: 52.9 %
Platelets: 247 10*3/uL (ref 140–400)
RBC: 4.16 10*6/uL (ref 3.80–5.10)
RDW: 12.4 % (ref 11.0–15.0)
Total Lymphocyte: 33.7 %
WBC: 3.5 10*3/uL — ABNORMAL LOW (ref 3.8–10.8)

## 2022-10-15 LAB — BASIC METABOLIC PANEL
BUN: 12 mg/dL (ref 7–25)
CO2: 28 mmol/L (ref 20–32)
Calcium: 9.3 mg/dL (ref 8.6–10.4)
Chloride: 99 mmol/L (ref 98–110)
Creat: 0.6 mg/dL (ref 0.60–1.00)
Glucose, Bld: 69 mg/dL (ref 65–99)
Potassium: 4.4 mmol/L (ref 3.5–5.3)
Sodium: 135 mmol/L (ref 135–146)

## 2022-10-15 LAB — C-REACTIVE PROTEIN: CRP: <3 mg/L (ref <8.0)

## 2022-10-15 LAB — SEDIMENTATION RATE: Sed Rate: 6 mm/h (ref 0–30)

## 2022-11-10 NOTE — Assessment & Plan Note (Signed)
RRR today 

## 2022-11-10 NOTE — Assessment & Plan Note (Signed)
On Levothyroxine, continue to monitor 

## 2022-11-10 NOTE — Assessment & Plan Note (Signed)
hgba1c acceptable, minimize simple carbs. Increase exercise as tolerated.  

## 2022-11-10 NOTE — Assessment & Plan Note (Signed)
Tolerating statin, encouraged heart healthy diet, avoid trans fats, minimize simple carbs and saturated fats. Increase exercise as tolerated 

## 2022-11-11 ENCOUNTER — Encounter: Payer: Self-pay | Admitting: Family Medicine

## 2022-11-11 ENCOUNTER — Ambulatory Visit (INDEPENDENT_AMBULATORY_CARE_PROVIDER_SITE_OTHER): Payer: Medicare Other | Admitting: Family Medicine

## 2022-11-11 VITALS — BP 126/64 | HR 54 | Temp 98.0°F | Resp 16 | Ht 64.0 in | Wt 137.0 lb

## 2022-11-11 DIAGNOSIS — I480 Paroxysmal atrial fibrillation: Secondary | ICD-10-CM | POA: Diagnosis not present

## 2022-11-11 DIAGNOSIS — R32 Unspecified urinary incontinence: Secondary | ICD-10-CM | POA: Diagnosis not present

## 2022-11-11 DIAGNOSIS — E782 Mixed hyperlipidemia: Secondary | ICD-10-CM

## 2022-11-11 DIAGNOSIS — R739 Hyperglycemia, unspecified: Secondary | ICD-10-CM

## 2022-11-11 DIAGNOSIS — R339 Retention of urine, unspecified: Secondary | ICD-10-CM | POA: Diagnosis not present

## 2022-11-11 DIAGNOSIS — E039 Hypothyroidism, unspecified: Secondary | ICD-10-CM

## 2022-11-11 NOTE — Progress Notes (Signed)
Subjective:    Patient ID: Crystal Russell, female    DOB: [birth year], 72 y.o.   MRN: [id number]  Chief Complaint  Patient presents with   Follow-up    Follow up    HPI Discussed the use of AI scribe software for clinical note transcription with the patient, who gave verbal consent to proceed.  History of Present Illness   The patient, with a history of thrush and diverticulitis, presents with multiple complaints. The patient reports recurrent lip swelling, initially diagnosed as thrush. Despite initial hesitation, the patient took prescribed medication and saw improvement. However, the swelling has returned, and the patient is unsure if it is due to dry weather or inadequate hydration.  The patient also reports urinary issues, specifically feeling like she is not completely emptying their bladder and experiencing leakage. The patient has a history of varicose veins in the pelvic area, which may contribute to these symptoms.  The patient's bowel problems have been a significant concern. After stopping doxycycline, the patient experienced severe symptoms suggestive of diverticulitis, including chills and difficulty passing stool. The patient was treated with Flagyl and Cipro, which improved symptoms. However, a CT scan revealed evidence of obstruction and severe diverticulitis. The patient is currently managing their bowel health with Miralax and dietary changes.  The patient has a history of knee surgeries and currently experiences pain in both knees. The right knee, which has been operated on twice, is the more painful of the two. The left knee is reported to be bone-on-bone. The patient reports that the knee pain is tolerable but is starting to affect mobility.  The patient also reports fatigue and feeling winded after minimal physical activity. The patient attributes this to being out of shape and possibly related to their hydration status. The patient also reports experiencing severe  depression while on Zetia, which has since been discontinued.        Past Medical History:  Diagnosis Date   Abdominal wall hernia 08/04/2012   Atrial fibrillation (HCC) 2008   REPORTS SHE WAS AT [place], UPON WAKING I FELT MY HEART GOING BOOM BOOM BOOM BEATING OUT OF MY CHEST. I WENT TO HOSPITAL AND THEY ISED MEDICINE TO CONVERT ME , IT TOOK OVER 10 HOURS TO CONVERT ME. BUT AFTER THAT IVE NEVER HAD ANY ISSUES SINCE. I USED TO HAVE A CARDIOLOGUIST AS MY PRIMARY IN [place] BUT IHAVENT BEEN TO A CARDIOLOGIST SINCE I MOVED DOWN HERE.    Chicken pox as a child   Depression    Depression with anxiety 12/27/2012   Diverticulitis    DJD (degenerative joint disease) 08/05/2012   DVT (deep venous thrombosis) (HCC) 2018   Fifth disease 1989   DX AFTER EXPOSURE FROM HER CHILDREN; DEVELOPED RHEUMATOID ARTHRITIS DURING COURSE OF ILLNESS. REPORTS:  " I DONT HAVE IT ANYMORE SINCE THE DISEASE IS GONE"    H/O gestational diabetes mellitus, not currently pregnant 08/05/2012   History of    Heart murmur    echo 2012; "I HAVE A FUNCTIONAL MURMUR"    Hyperlipidemia    Hypothyroidism    Knee effusion, right    while on Eliquis, discontinued Eliquis after DVT resolved   Measles as a child   Migraines    Other and unspecified hyperlipidemia 08/05/2012   Paroxysmal A-fib (HCC)    Shingles 43   Spondylisthesis 2012   L5/S1 grade 3   Varicose veins 08/05/2012   Extending up to right groin B/l LE   Ventral hernia 08/05/2012  Wears glasses     Past Surgical History:  Procedure Laterality Date   BREAST DUCTAL SYSTEM EXCISION Right 05/05/2014   Procedure: RIGHT BREAST DUCT EXCISION;  Surgeon: Emelia Loron, MD;  Location: [facility];  Service: General;  Laterality: Right;   COLONOSCOPY     DILATION AND CURETTAGE OF UTERUS     x4   EXCISIONAL TOTAL KNEE ARTHROPLASTY WITH ANTIBIOTIC SPACERS Right 09/14/2020   Procedure: Irrigation and debriement with poly exchange versus resection of right total  knee arthroplasty and placement of antibitiotic spacer;  Surgeon: Durene Romans, MD;  Location: [facility];  Service: Orthopedics;  Laterality: Right;   MOUTH SURGERY     TOTAL KNEE ARTHROPLASTY Right 07/01/2017   Procedure: RIGHT TOTAL KNEE ARTHROPLASTY;  Surgeon: Durene Romans, MD;  Location: [facility];  Service: Orthopedics;  Laterality: Right;  70 mins   VARICOSE VEIN SURGERY  1995   right leg    Family History  Problem Relation Age of Onset   Cancer Mother        lung- smoker   Liver disease Mother    Cancer Father        lung- smoker   Peripheral Artery Disease Sister    Heart attack Paternal Uncle    Dementia Maternal Grandmother    Heart attack Maternal Grandfather    Asthma Son    Ulcerative colitis Son    Asthma Son    Ulcerative colitis Son    Pancreatic cancer Other    Stomach cancer Other    Colon cancer Neg Hx    Cystic fibrosis Neg Hx    Esophageal cancer Neg Hx     Social History   Socioeconomic History   Marital status: Married    Spouse name: Not on file   Number of children: Not on file   Years of education: Not on file   Highest education level: Not on file  Occupational History   Not on file  Tobacco Use   Smoking status: Former    Types: Cigarettes   Smokeless tobacco: Never  Vaping Use   Vaping status: Never Used  Substance and Sexual Activity   Alcohol use: No   Drug use: No   Sexual activity: Yes  Other Topics Concern   Not on file  Social History Narrative   Not on file   Social Determinants of Health   Financial Resource Strain: Not on file  Food Insecurity: Not on file  Transportation Needs: Not on file  Physical Activity: Not on file  Stress: Not on file  Social Connections: Not on file  Intimate Partner Violence: Not on file    Outpatient Medications Prior to Visit  Medication Sig Dispense Refill   acetaminophen (TYLENOL) 500 MG tablet Take 1,000 mg by mouth as needed.     Alirocumab (PRALUENT) 75 MG/ML SOAJ Inject 75 mg into  the skin every 14 (fourteen) days. 2 mL 11   aspirin EC 81 MG tablet Take 81 mg by mouth daily. Swallow whole.     atorvastatin (LIPITOR) 10 MG tablet Take 0.5 tablets (5 mg total) by mouth daily. 45 tablet 2   Coenzyme Q10 300 MG CAPS Take 300 mg by mouth every evening.     levothyroxine (SYNTHROID) 75 MCG tablet Take 75 mcg by mouth daily.     ondansetron (ZOFRAN-ODT) 4 MG disintegrating tablet Take 1 tablet (4 mg total) by mouth every 6 (six) hours as needed for nausea or vomiting. 30 tablet 0  polyethylene glycol (MIRALAX / GLYCOLAX) 17 g packet Take 17 g by mouth 2 (two) times daily.     saccharomyces boulardii (FLORASTOR) 250 MG capsule Take 1 capsule (250 mg total) by mouth 2 (two) times daily. 60 capsule 0   amoxicillin (AMOXIL) 500 MG capsule Take a total of 2 grams one hour before dental procedure 2 capsule 0   dicyclomine (BENTYL) 10 MG capsule Take 1 capsule (10 mg total) by mouth 3 (three) times daily as needed for spasms. 90 capsule 1   ezetimibe (ZETIA) 10 MG tablet Take 1 tablet (10 mg total) by mouth daily. 90 tablet 3   levothyroxine (SYNTHROID) 88 MCG tablet TAKE ONE TABLET BY MOUTH ONE TIME DAILY 90 tablet 0   Facility-Administered Medications Prior to Visit  Medication Dose Route Frequency Provider Last Rate Last Admin   0.9 %  sodium chloride infusion  500 mL Intravenous Once Armbruster, Willaim Rayas, MD        Allergies  Allergen Reactions   Ciprofloxacin Other (See Comments)    Abdominal pain-06/17/22-patient states she is not allergic, no rash or swelling    Crestor [Rosuvastatin] Other (See Comments)    Joint pain w/fibromyalgia   Pravachol [Pravastatin] Other (See Comments)    Joint pain w/fibromyalgia   Cefazolin Rash   Rifampin Rash    Review of Systems  Constitutional:  Positive for malaise/fatigue. Negative for fever.  HENT:  Negative for congestion.   Eyes:  Negative for blurred vision.  Respiratory:  Negative for shortness of breath.   Cardiovascular:   Negative for chest pain, palpitations and leg swelling.  Gastrointestinal:  Positive for abdominal pain and constipation. Negative for blood in stool and nausea.  Genitourinary:  Negative for dysuria and frequency.  Musculoskeletal:  Positive for joint pain. Negative for falls.  Skin:  Negative for rash.  Neurological:  Negative for dizziness, loss of consciousness and headaches.  Endo/Heme/Allergies:  Negative for environmental allergies.  Psychiatric/Behavioral:  Negative for depression. The patient is not nervous/anxious.        Objective:    Physical Exam Constitutional:      General: She is not in acute distress.    Appearance: Normal appearance. She is well-developed. She is not toxic-appearing.  HENT:     Head: Normocephalic and atraumatic.     Right Ear: External ear normal.     Left Ear: External ear normal.     Nose: Nose normal.  Eyes:     General:        Right eye: No discharge.        Left eye: No discharge.     Conjunctiva/sclera: Conjunctivae normal.  Neck:     Thyroid: No thyromegaly.  Cardiovascular:     Rate and Rhythm: Normal rate and regular rhythm.     Heart sounds: Normal heart sounds. No murmur heard. Pulmonary:     Effort: Pulmonary effort is normal. No respiratory distress.     Breath sounds: Normal breath sounds.  Abdominal:     General: Bowel sounds are normal.     Palpations: Abdomen is soft.     Tenderness: There is no abdominal tenderness. There is no guarding.  Musculoskeletal:        General: Normal range of motion.     Cervical back: Neck supple.  Lymphadenopathy:     Cervical: No cervical adenopathy.  Skin:    General: Skin is warm and dry.  Neurological:     Mental Status: She is alert and  oriented to person, place, and time.  Psychiatric:        Mood and Affect: Mood normal.        Behavior: Behavior normal.        Thought Content: Thought content normal.        Judgment: Judgment normal.     BP 126/64 (BP Location: Left Arm,  Patient Position: Sitting, Cuff Size: Normal)   Pulse (!) 54   Temp 98 F (36.7 C) (Oral)   Resp 16   Ht 5\' 4"  (1.626 m)   Wt 137 lb (62.1 kg)   SpO2 99%   BMI 23.52 kg/m  Wt Readings from Last 3 Encounters:  11/11/22 137 lb (62.1 kg)  10/14/22 132 lb (59.9 kg)  08/08/22 135 lb (61.2 kg)    Diabetic Foot Exam - Simple   No data filed    Lab Results  Component Value Date   WBC 3.5 (L) 10/14/2022   HGB 12.2 10/14/2022   HCT 37.0 10/14/2022   PLT 247 10/14/2022   GLUCOSE 69 10/14/2022   CHOL 156 05/20/2022   TRIG 49 05/20/2022   HDL 69 05/20/2022   LDLCALC 76 05/20/2022   ALT 32 05/20/2022   AST 44 (H) 09/24/2021   NA 135 10/14/2022   K 4.4 10/14/2022   CL 99 10/14/2022   CREATININE 0.60 10/14/2022   BUN 12 10/14/2022   CO2 28 10/14/2022   TSH 0.96 10/01/2021   HGBA1C 5.6 03/29/2021    Lab Results  Component Value Date   TSH 0.96 10/01/2021   Lab Results  Component Value Date   WBC 3.5 (L) 10/14/2022   HGB 12.2 10/14/2022   HCT 37.0 10/14/2022   MCV 88.9 10/14/2022   PLT 247 10/14/2022   Lab Results  Component Value Date   NA 135 10/14/2022   K 4.4 10/14/2022   CO2 28 10/14/2022   GLUCOSE 69 10/14/2022   BUN 12 10/14/2022   CREATININE 0.60 10/14/2022   BILITOT 0.9 09/24/2021   ALKPHOS 91 07/03/2021   ALKPHOS 91 07/03/2021   AST 44 (H) 09/24/2021   ALT 32 05/20/2022   PROT 6.2 09/24/2021   ALBUMIN 4.0 07/03/2021   ALBUMIN 4.0 07/03/2021   CALCIUM 9.3 10/14/2022   ANIONGAP 10 11/27/2020   GFR 90.97 06/17/2022   Lab Results  Component Value Date   CHOL 156 05/20/2022   Lab Results  Component Value Date   HDL 69 05/20/2022   Lab Results  Component Value Date   LDLCALC 76 05/20/2022   Lab Results  Component Value Date   TRIG 49 05/20/2022   Lab Results  Component Value Date   CHOLHDL 2.3 05/20/2022   Lab Results  Component Value Date   HGBA1C 5.6 03/29/2021       Assessment & Plan:  Hyperglycemia Assessment & Plan: hgba1c  acceptable, minimize simple carbs. Increase exercise as tolerated.    Orders: -     Comprehensive metabolic panel  Hyperlipidemia, mixed Assessment & Plan: Tolerating statin, encouraged heart healthy diet, avoid trans fats, minimize simple carbs and saturated fats. Increase exercise as tolerated. Did not tolerate Zetia with mental status changes so stopped and she has not started the Praluent yet.  Orders: -     Lipid panel  Hypothyroidism, unspecified type Assessment & Plan: On Levothyroxine, continue to monitor   Paroxysmal A-fib (HCC) Assessment & Plan: RRR today  Orders: -     CBC with Differential/Platelet  Urinary incontinence, unspecified type -  Ambulatory referral to Urogynecology  Incomplete bladder emptying -     Ambulatory referral to Urogynecology    Assessment and Plan    Oral Thrush Recurrent episodes of oral thrush, previously treated with antifungal medication. Currently experiencing swollen lips, possibly due to dry weather. -Encouraged to maintain hydration to alleviate dryness.  Incomplete Bladder Emptying Reports of not feeling like bladder is completely emptied. Possible pelvic floor dysfunction or bladder position changes due to aging. -Referral to urogynecology for further evaluation and possible pelvic floor physical therapy.  Diverticulitis History of severe diverticulitis with recent flare-up managed with Flagyl and Cipro. Colonoscopy revealed severe diverticulosis and scar tissue. -Continue current management plan. -Consider holding prescriptions for Flagyl and Cipro for potential future flare-ups. -Encouraged to maintain hydration and manage diet to prevent flare-ups.  Knee Pain Chronic right knee pain post-surgery, left knee bone-on-bone. -Consider physical therapy for strength and balance.  Hyperlipidemia LDL of 80, currently on Atorvastatin  Depression Reports of severe depression on Zetia which resolved with discontinuation  of med -Continue current management plan.  Aortic Atherosclerosis Noted on recent CT scan. -No specific intervention needed at this time, common finding with age.  General Health Maintenance -Encouraged to maintain hydration, especially in heat. -Consider chair yoga for balance and strength. -Consider afternoon naps to manage fatigue. -Order routine labs including cholesterol, metabolic panel, and CBC. -Consider referral to hematologist for opinion on Xarelto use. -Stop Actonel due to nosebleeds.         Danise Edge, MD

## 2022-11-11 NOTE — Patient Instructions (Addendum)
Pendulum probiotics    Diverticulitis  Diverticulitis happens when poop (stool) and bacteria get trapped in small pouches in the colon called diverticula. These pouches may form if you have a condition called diverticulosis. When the poop and bacteria get trapped, it can cause an infection and inflammation. Diverticulitis may cause severe stomach pain and diarrhea. It can also lead to tissue damage in your colon. This can cause bleeding or blockage. In some cases, the diverticula may burst (rupture). This can cause infected poop to go into other parts of your abdomen. What are the causes? This condition is caused by poop getting trapped in the diverticula. This allows bacteria to grow. It can lead to inflammation and infection. What increases the risk? You are more likely to get this condition if you have diverticulosis. You are also more at risk if: You are overweight or obese. You do not get enough exercise. You drink alcohol. You smoke. You eat a lot of red meat, such as beef, pork, or lamb. You do not get enough fiber. Foods high in fiber include fruits, vegetables, beans, nuts, and whole grains. You are over 72 years of age. What are the signs or symptoms? Symptoms of this condition may include: Pain and tenderness in the abdomen. This pain is often felt on the left side but may occur in other spots. Fever and chills. Nausea and vomiting. Cramping. Bloating. Changes in how often you poop. Blood in your poop. How is this diagnosed? This condition is diagnosed based on your medical history and a physical exam. You may also have tests done to make sure there is nothing else causing your condition. These tests may include: Blood tests. Tests done on your pee (urine). A CT scan of the abdomen. You may need to have a colonoscopy. This is an exam to look at your whole large intestine. During the exam, a tube is put into the opening of your butt (anus) and then moved into your rectum,  colon, and other parts of the large intestine. This exam is done to look at the diverticula. It can also see if there is something else that may be causing your symptoms. How is this treated? Most cases are mild and can be treated at home. You may be told to: Take over-the-counter pain medicine. Only eat and drink clear liquids. Take antibiotics. Rest. More severe cases may need to be treated at a hospital. Treatment may include: Not eating or drinking. Taking pain medicines. Getting antibiotics through an IV. Getting fluids and nutrition through an IV. Surgery. Follow these instructions at home: Medicines Take over-the-counter and prescription medicines only as told by your health care provider. These include fiber supplements, probiotics, and medicines to soften your poop (stool softeners). If you were prescribed antibiotics, take them as told by your provider. Do not stop using the antibiotic even if you start to feel better. Ask your provider if the medicine prescribed to you requires you to avoid driving or using machinery. Eating and drinking  Follow the diet told by your provider. You may need to only eat and drink liquids. After your symptoms get better, you may be able to return to a more normal diet. You may be told to eat at least 25 grams (25 g) of fiber each day. Fiber makes it easier to poop. Healthy sources of fiber include: Berries. One cup has 4-8 g of fiber. Beans or lentils. One-half cup has 5-8 g of fiber. Green vegetables. One cup has 4 g of  fiber. Avoid eating red meat. General instructions Do not use any products that contain nicotine or tobacco. These products include cigarettes, chewing tobacco, and vaping devices, such as e-cigarettes. If you need help quitting, ask your provider. Exercise for at least 30 minutes, 3 times a week. Exercise hard enough to raise your heart rate and break a sweat. Contact a health care provider if: Your pain gets worse. Your  pooping does not go back to normal. Your symptoms do not get better with treatment. Your symptoms get worse all of a sudden. You have a fever. You vomit more than one time. Your poop is bloody, black, or tarry. This information is not intended to replace advice given to you by your health care provider. Make sure you discuss any questions you have with your health care provider. Document Revised: 12/20/2021 Document Reviewed: 12/20/2021 Elsevier Patient Education  2024 [employer].

## 2022-11-15 ENCOUNTER — Other Ambulatory Visit: Payer: Self-pay | Admitting: Family

## 2022-11-15 ENCOUNTER — Telehealth: Payer: Self-pay | Admitting: Family Medicine

## 2022-11-15 NOTE — Telephone Encounter (Signed)
Pt states she was prescribed a nystatin mouthwash previously and she would like a refill on that for burning mouth syndrome. Pt states she is waiting to get in with a specialist but needs this in the meantime. States she does not need to see pcp or another provider because she was just here.    [id number] - [place], [place] - [address] [place] [address] Phone: [phone]  Fax: [phone]

## 2022-11-17 ENCOUNTER — Other Ambulatory Visit: Payer: Self-pay | Admitting: Family Medicine

## 2022-11-18 DIAGNOSIS — E038 Other specified hypothyroidism: Secondary | ICD-10-CM | POA: Diagnosis not present

## 2022-11-18 DIAGNOSIS — E063 Autoimmune thyroiditis: Secondary | ICD-10-CM | POA: Diagnosis not present

## 2022-11-26 DIAGNOSIS — K146 Glossodynia: Secondary | ICD-10-CM | POA: Diagnosis not present

## 2022-11-26 DIAGNOSIS — B370 Candidal stomatitis: Secondary | ICD-10-CM | POA: Diagnosis not present

## 2022-12-12 DIAGNOSIS — H43812 Vitreous degeneration, left eye: Secondary | ICD-10-CM | POA: Diagnosis not present

## 2022-12-12 DIAGNOSIS — H5202 Hypermetropia, left eye: Secondary | ICD-10-CM | POA: Diagnosis not present

## 2022-12-12 DIAGNOSIS — H524 Presbyopia: Secondary | ICD-10-CM | POA: Diagnosis not present

## 2022-12-12 DIAGNOSIS — H353132 Nonexudative age-related macular degeneration, bilateral, intermediate dry stage: Secondary | ICD-10-CM | POA: Diagnosis not present

## 2022-12-12 DIAGNOSIS — H2513 Age-related nuclear cataract, bilateral: Secondary | ICD-10-CM | POA: Diagnosis not present

## 2022-12-12 DIAGNOSIS — H52223 Regular astigmatism, bilateral: Secondary | ICD-10-CM | POA: Diagnosis not present

## 2022-12-16 DIAGNOSIS — Z23 Encounter for immunization: Secondary | ICD-10-CM | POA: Diagnosis not present

## 2022-12-18 IMAGING — MG MM DIGITAL SCREENING BILAT W/ TOMO AND CAD
8 series · 9 of 24 positions shown · non-contrast
Comparison: Previous exam(s).

CLINICAL DATA: Screening.

EXAM:
DIGITAL SCREENING BILATERAL MAMMOGRAM WITH TOMOSYNTHESIS AND CAD
TECHNIQUE: Bilateral screening digital craniocaudal and mediolateral oblique
mammograms were obtained. Bilateral screening digital breast
tomosynthesis was performed. The images were evaluated with
computer-aided detection.

[R CC synth-2D]
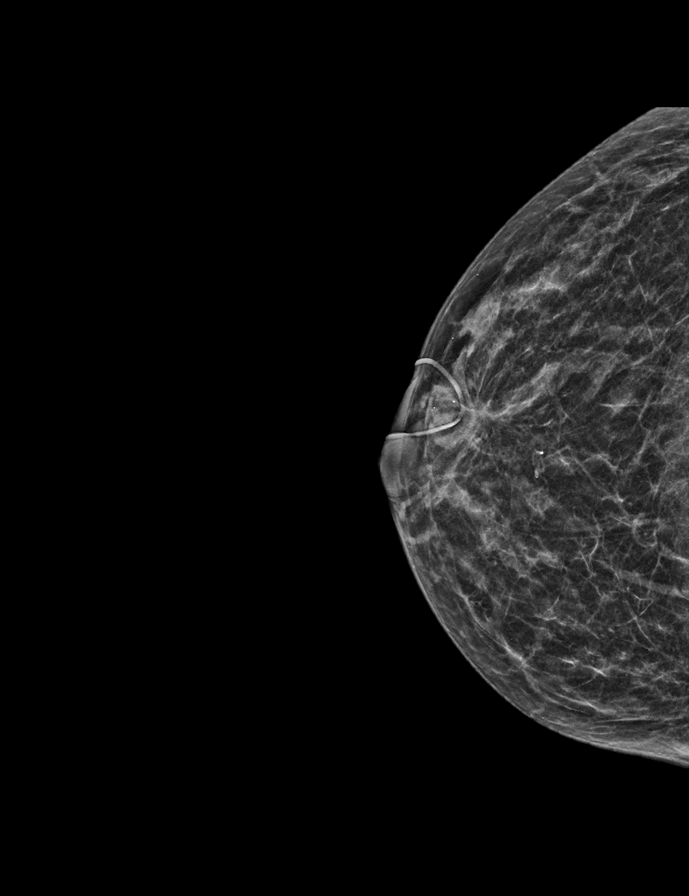

[L MLO synth-2D]
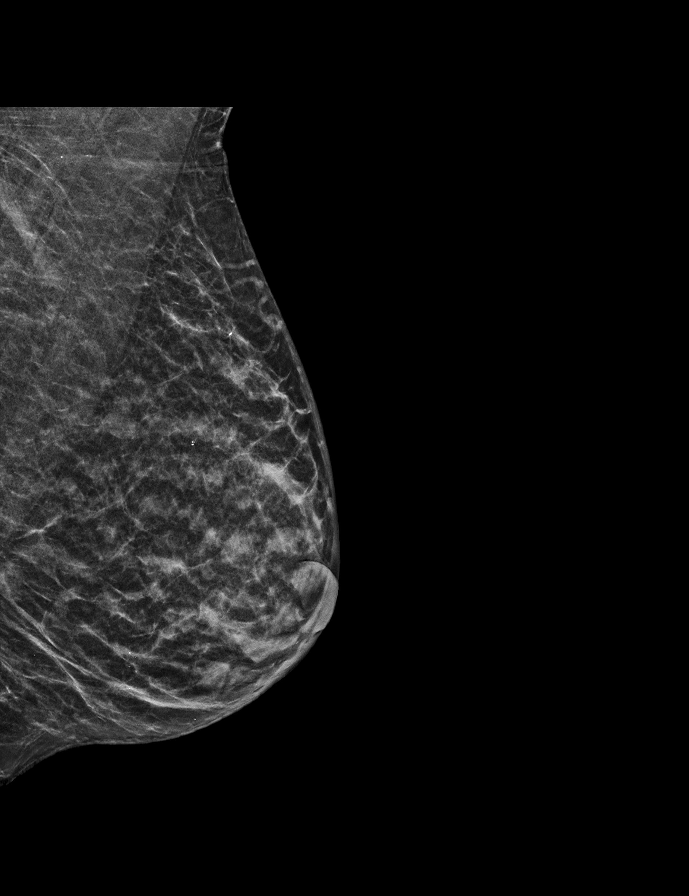

[R MLO synth-2D]
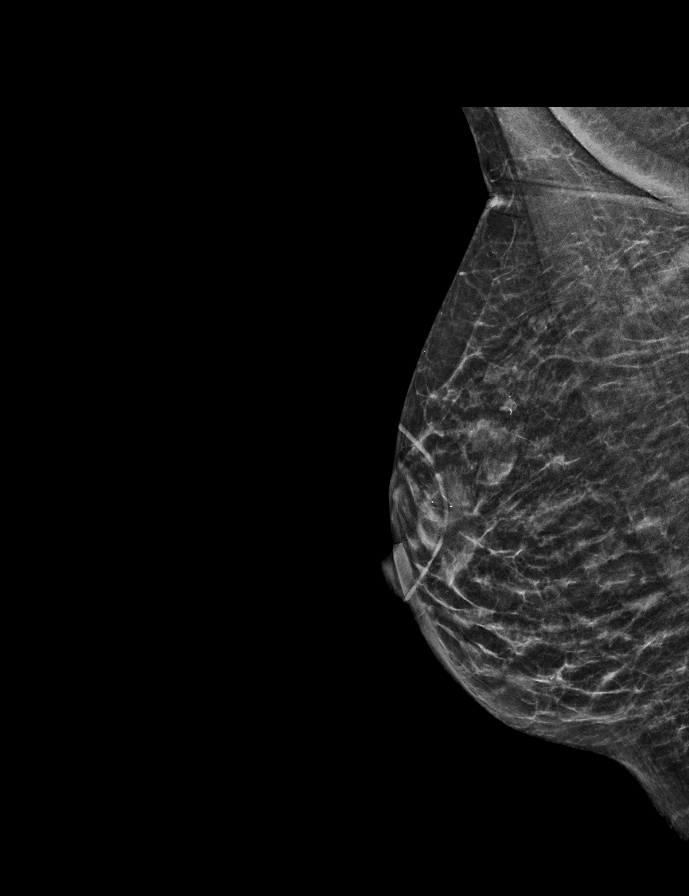

[L CC synth-2D]
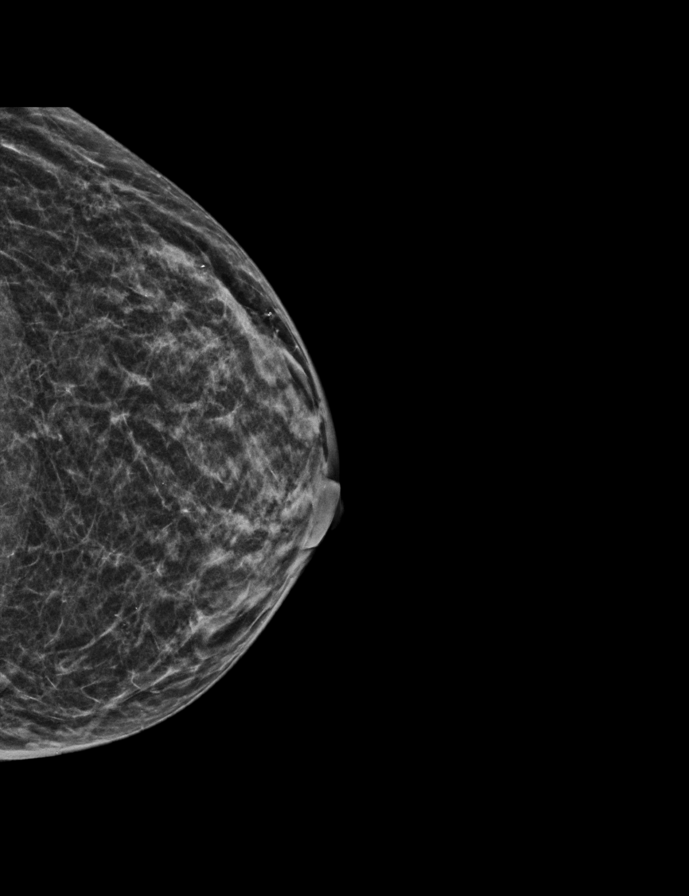

[L CC tomo · 2 of 32 frames shown]
[frame 11/32]
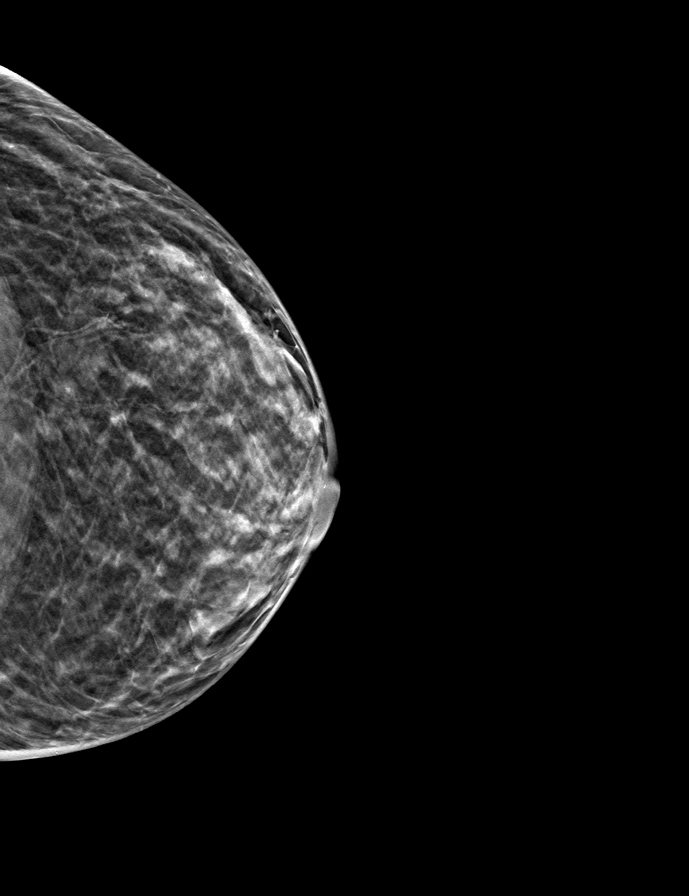
[frame 17/32]
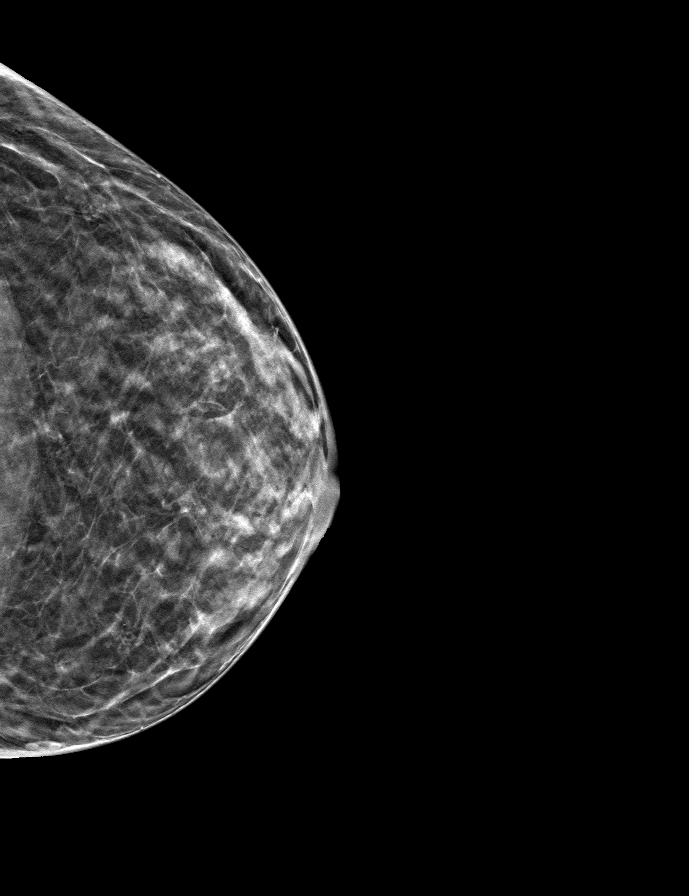

[R MLO tomo · tomo slice 19/36.0]
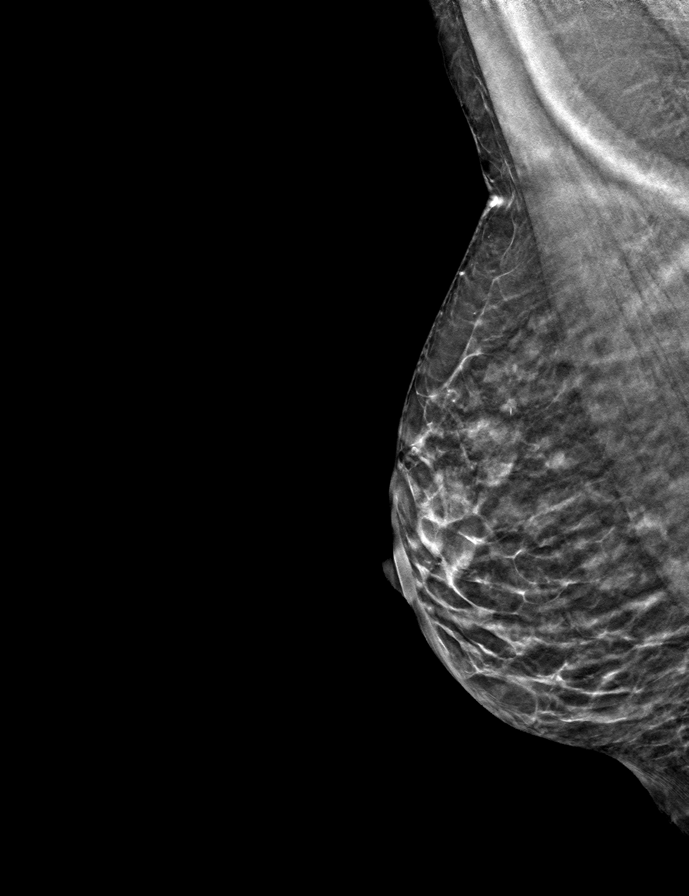

[L MLO tomo · tomo slice 18/35.0]
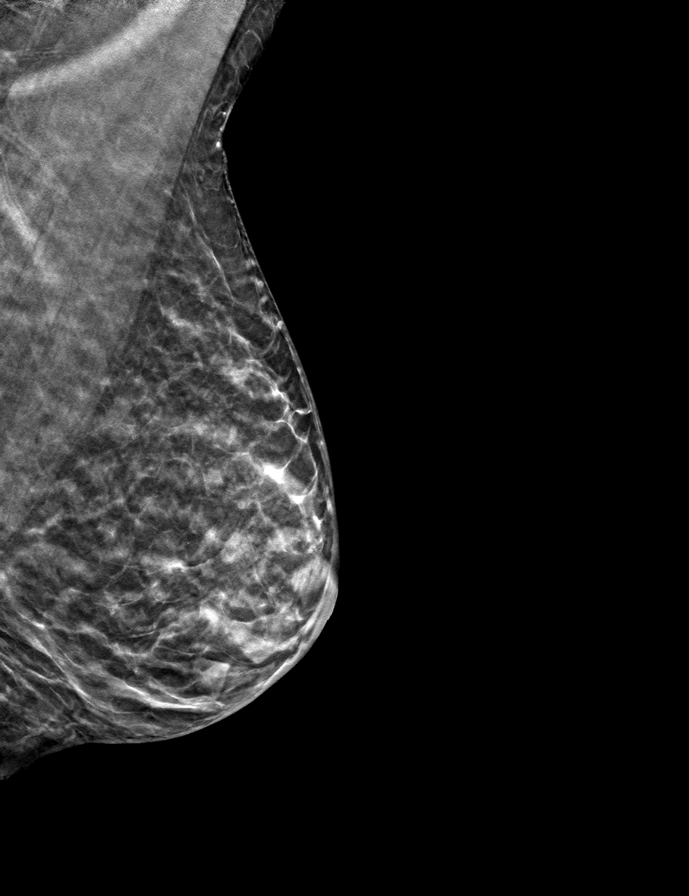

[R CC tomo · tomo slice 17/33.0]
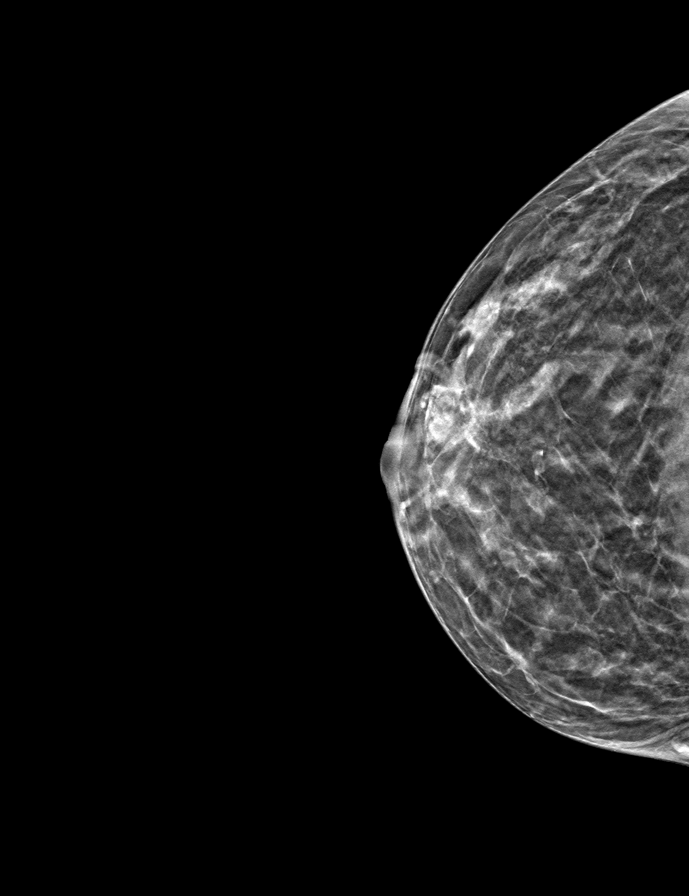

[9 of 24 positions shown; findings below may reference images not displayed]

ACR Breast Density Category b: There are scattered areas of
fibroglandular density.
FINDINGS: There are no findings suspicious for malignancy.
IMPRESSION: No mammographic evidence of malignancy. A result letter of this
screening mammogram will be mailed directly to the patient.

RECOMMENDATION:
Screening mammogram in one year. (Code:51-O-LD2)

BI-RADS CATEGORY  1: Negative.

## 2022-12-25 ENCOUNTER — Encounter: Payer: Self-pay | Admitting: Internal Medicine

## 2022-12-25 MED ORDER — AMOXICILLIN 500 MG PO CAPS: 1000.0000 mg | ORAL_CAPSULE | Freq: Once | ORAL | 0 refills | Status: AC

## 2022-12-26 ENCOUNTER — Other Ambulatory Visit: Payer: Self-pay | Admitting: Internal Medicine

## 2023-01-07 DIAGNOSIS — D225 Melanocytic nevi of trunk: Secondary | ICD-10-CM | POA: Diagnosis not present

## 2023-01-07 DIAGNOSIS — L821 Other seborrheic keratosis: Secondary | ICD-10-CM | POA: Diagnosis not present

## 2023-01-07 DIAGNOSIS — L814 Other melanin hyperpigmentation: Secondary | ICD-10-CM | POA: Diagnosis not present

## 2023-01-07 DIAGNOSIS — L578 Other skin changes due to chronic exposure to nonionizing radiation: Secondary | ICD-10-CM | POA: Diagnosis not present

## 2023-01-07 DIAGNOSIS — D1801 Hemangioma of skin and subcutaneous tissue: Secondary | ICD-10-CM | POA: Diagnosis not present

## 2023-01-10 ENCOUNTER — Ambulatory Visit (INDEPENDENT_AMBULATORY_CARE_PROVIDER_SITE_OTHER): Payer: Medicare Other

## 2023-01-10 ENCOUNTER — Encounter: Payer: Self-pay | Admitting: Internal Medicine

## 2023-01-10 ENCOUNTER — Ambulatory Visit: Payer: Medicare Other | Attending: Internal Medicine | Admitting: Internal Medicine

## 2023-01-10 VITALS — BP 120/62 | HR 54 | Ht 64.5 in | Wt 138.0 lb

## 2023-01-10 DIAGNOSIS — I480 Paroxysmal atrial fibrillation: Secondary | ICD-10-CM

## 2023-01-10 DIAGNOSIS — I351 Nonrheumatic aortic (valve) insufficiency: Secondary | ICD-10-CM | POA: Diagnosis not present

## 2023-01-10 DIAGNOSIS — I491 Atrial premature depolarization: Secondary | ICD-10-CM | POA: Diagnosis not present

## 2023-01-10 DIAGNOSIS — I700 Atherosclerosis of aorta: Secondary | ICD-10-CM | POA: Diagnosis not present

## 2023-01-10 DIAGNOSIS — I471 Supraventricular tachycardia, unspecified: Secondary | ICD-10-CM | POA: Diagnosis not present

## 2023-01-10 NOTE — Progress Notes (Unsigned)
Enrolled patient for a 7 day Zio XT monitor to be mailed to patients home.  

## 2023-01-10 NOTE — Progress Notes (Signed)
Cardiology Office Note:    Date:  01/10/2023   ID:  Crystal Russell, DOB [birth year], MRN [id number]  PCP:  Bradd Canary, MD  [facility] Cardiologist: Riley Lam MD [facility] Electrophysiologist:  None   CC:HLD  History of Present Illness:    Crystal Russell is a 72 y.o. female with a hx of PAF in [place], Mild Atrial Functional MR, Mild to moderate AI without LV dilation, PFO, possible distant DVT possible history of rheumatic and HLD who presents for evaluation 05/01/20.   2022: No AF on heart monitor. Only tolerate 5 mg of atorvastatin without myalgias.  2023: HR 180 at ID visit (no EKG)  Discussed the use of AI scribe software for clinical note transcription with the patient, who gave verbal consent to proceed.  History of Present Illness         Crystal Russell, a 72 year old female with a history of paroxysmal atrial fibrillation, mild atrial functional MR, mild aortic regurgitation, and statin intolerance, presents with concerns about her cholesterol levels and fear of starting a new injectable medication (PCSK9 inhibitor). She has been unable to tolerate statins due to side effects and has been prescribed Repatha, a PCSK9 inhibitor, which she has yet to start due to fear of potential side effects. She also mentions a recent diagnosis of diverticulosis, which has significantly impacted her diet and lifestyle, leading to a decrease in her physical activity and an increase in her stress levels. She reports occasional palpitations and has noticed a spike in her heart rate during periods of inactivity. She also has a history of a blood clot in her leg about six to eight years ago.   Past Medical History:  Diagnosis Date   Abdominal wall hernia 08/04/2012   Atrial fibrillation (HCC) 2008   REPORTS SHE WAS AT [place], UPON WAKING I FELT MY HEART GOING BOOM BOOM BOOM BEATING OUT OF MY CHEST. I WENT TO HOSPITAL AND THEY ISED MEDICINE TO CONVERT ME , IT TOOK OVER 10 HOURS TO CONVERT ME.  BUT AFTER THAT IVE NEVER HAD ANY ISSUES SINCE. I USED TO HAVE A CARDIOLOGUIST AS MY PRIMARY IN [place] BUT IHAVENT BEEN TO A CARDIOLOGIST SINCE I MOVED DOWN HERE.    Chicken pox as a child   Depression    Depression with anxiety 12/27/2012   Diverticulitis    DJD (degenerative joint disease) 08/05/2012   DVT (deep venous thrombosis) (HCC) 2018   Fifth disease 1989   DX AFTER EXPOSURE FROM HER CHILDREN; DEVELOPED RHEUMATOID ARTHRITIS DURING COURSE OF ILLNESS. REPORTS:  " I DONT HAVE IT ANYMORE SINCE THE DISEASE IS GONE"    H/O gestational diabetes mellitus, not currently pregnant 08/05/2012   History of    Heart murmur    echo 2012; "I HAVE A FUNCTIONAL MURMUR"    Hyperlipidemia    Hypothyroidism    Knee effusion, right    while on Eliquis, discontinued Eliquis after DVT resolved   Measles as a child   Migraines    Other and unspecified hyperlipidemia 08/05/2012   Paroxysmal A-fib (HCC)    Shingles 43   Spondylisthesis 2012   L5/S1 grade 3   Varicose veins 08/05/2012   Extending up to right groin B/l LE   Ventral hernia 08/05/2012   Wears glasses     Past Surgical History:  Procedure Laterality Date   BREAST DUCTAL SYSTEM EXCISION Right 05/05/2014   Procedure: RIGHT BREAST DUCT EXCISION;  Surgeon: Emelia Loron, MD;  Location: [facility];  Service: General;  Laterality: Right;   COLONOSCOPY     DILATION AND CURETTAGE OF UTERUS     x4   EXCISIONAL TOTAL KNEE ARTHROPLASTY WITH ANTIBIOTIC SPACERS Right 09/14/2020   Procedure: Irrigation and debriement with poly exchange versus resection of right total knee arthroplasty and placement of antibitiotic spacer;  Surgeon: Durene Romans, MD;  Location: [facility];  Service: Orthopedics;  Laterality: Right;   MOUTH SURGERY     TOTAL KNEE ARTHROPLASTY Right 07/01/2017   Procedure: RIGHT TOTAL KNEE ARTHROPLASTY;  Surgeon: Durene Romans, MD;  Location: [facility];  Service: Orthopedics;  Laterality: Right;  70 mins   VARICOSE VEIN  SURGERY  1995   right leg    Current Medications: Current Meds  Medication Sig   acetaminophen (TYLENOL) 500 MG tablet Take 1,000 mg by mouth as needed.   Alirocumab (PRALUENT) 75 MG/ML SOAJ Inject 75 mg into the skin every 14 (fourteen) days.   aspirin EC 81 MG tablet Take 81 mg by mouth daily. Swallow whole.   atorvastatin (LIPITOR) 10 MG tablet Take 0.5 tablets (5 mg total) by mouth daily. Please keep scheduled appointment for future refills. Thank you.   Coenzyme Q10 300 MG CAPS Take 300 mg by mouth every evening.   levothyroxine (SYNTHROID) 75 MCG tablet Take 75 mcg by mouth daily.   polyethylene glycol (MIRALAX / GLYCOLAX) 17 g packet Take 17 g by mouth 2 (two) times daily.   saccharomyces boulardii (FLORASTOR) 250 MG capsule Take 1 capsule (250 mg total) by mouth 2 (two) times daily.   tacrolimus (PROGRAF) 1 MG capsule as needed (gum irritation).   Current Facility-Administered Medications for the 01/10/23 encounter (Office Visit) with Riley Lam A, MD  Medication   0.9 %  sodium chloride infusion     Allergies:   Ciprofloxacin, Crestor [rosuvastatin], Pravachol [pravastatin], Cefazolin, and Rifampin   Social History   Socioeconomic History   Marital status: Married    Spouse name: Not on file   Number of children: Not on file   Years of education: Not on file   Highest education level: Not on file  Occupational History   Not on file  Tobacco Use   Smoking status: Former    Types: Cigarettes   Smokeless tobacco: Never  Vaping Use   Vaping status: Never Used  Substance and Sexual Activity   Alcohol use: No   Drug use: No   Sexual activity: Yes  Other Topics Concern   Not on file  Social History Narrative   Not on file   Social Determinants of Health   Financial Resource Strain: Not on file  Food Insecurity: Not on file  Transportation Needs: Not on file  Physical Activity: Not on file  Stress: Not on file  Social Connections: Not on file     SOCIAL: Son has UC; one son Hilda Lias (born on earth day) was very sick with COVID, one son with two kids.  Doesn't do well with percentages in discussion Dealing with the loss of her dog  Family History: The patient's family history includes Asthma in her son and son; Cancer in her father and mother; Dementia in her maternal grandmother; Heart attack in her maternal grandfather and paternal uncle; Liver disease in her mother; Pancreatic cancer in an other family member; Peripheral Artery Disease in her sister; Stomach cancer in an other family member; Ulcerative colitis in her son and son. There is no history of Colon cancer, Cystic fibrosis, or Esophageal cancer. History  of coronary artery disease notable for grandfather.  ROS:   Please see the history of present illness.     EKGs/Labs/Other Studies Reviewed:    The following studies were reviewed today:  EKG:   11/12/21: Sinus Bradycardia rate 49 05/01/2020: sinus Bradycardia rate 55 WNL 06/24/2017: Sinus Bradycardia Rate 51   Cardiac Studies & Procedures       ECHOCARDIOGRAM  ECHOCARDIOGRAM COMPLETE 03/19/2022  Narrative ECHOCARDIOGRAM REPORT    Patient Name:   Crystal Russell  Date of Exam: 03/19/2022 Medical Rec #:  [id number]     Height:       64.5 in Accession #:    [id number]    Weight:       139.0 lb Date of Birth:  06-13-1950     BSA:          1.685 m Patient Age:    71 years      BP:           110/70 mmHg Patient Gender: F             HR:           53 bpm. Exam Location:  [address]  Procedure: 2D Echo, 3D Echo, Cardiac Doppler, Color Doppler and Strain Analysis  Indications:    I35.1 Aortic Insufficiency  History:        Patient has prior history of Echocardiogram examinations, most recent 03/14/2021. Aortic Valve Disease and Mitral Valve Disease, Arrythmias:Atrial Fibrillation, Signs/Symptoms:Murmur; Risk Factors:Dyslipidemia, Family History of Coronary Artery Disease and Former Smoker.  Sonographer:     Farrel Conners RDCS Referring Phys: Bryon Lions BLYTH  IMPRESSIONS   1. Left ventricular ejection fraction, by estimation, is 60 to 65%. The left ventricle has normal function. The left ventricle has no regional wall motion abnormalities. Left ventricular diastolic parameters were normal. 2. Right ventricular systolic function is normal. The right ventricular size is normal. There is normal pulmonary artery systolic pressure. 3. Left atrial size was severely dilated. 4. Right atrial size was mildly dilated. 5. The mitral valve is normal in structure. Mild mitral valve regurgitation. No evidence of mitral stenosis. 6. The aortic valve is tricuspid. Aortic valve regurgitation is trivial. No aortic stenosis is present. 7. The inferior vena cava is normal in size with greater than 50% respiratory variability, suggesting right atrial pressure of 3 mmHg.  FINDINGS Left Ventricle: Left ventricular ejection fraction, by estimation, is 60 to 65%. The left ventricle has normal function. The left ventricle has no regional wall motion abnormalities. The left ventricular internal cavity size was normal in size. There is no left ventricular hypertrophy. Left ventricular diastolic parameters were normal. Normal left ventricular filling pressure.  Right Ventricle: The right ventricular size is normal. No increase in right ventricular wall thickness. Right ventricular systolic function is normal. There is normal pulmonary artery systolic pressure. The tricuspid regurgitant velocity is 2.38 m/s, and with an assumed right atrial pressure of 3 mmHg, the estimated right ventricular systolic pressure is 25.7 mmHg.  Left Atrium: Left atrial size was severely dilated.  Right Atrium: Right atrial size was mildly dilated.  Pericardium: There is no evidence of pericardial effusion.  Mitral Valve: The mitral valve is normal in structure. Mild mitral valve regurgitation. No evidence of mitral valve  stenosis.  Tricuspid Valve: The tricuspid valve is normal in structure. Tricuspid valve regurgitation is trivial. No evidence of tricuspid stenosis.  Aortic Valve: The aortic valve is tricuspid. Aortic valve regurgitation is trivial. Aortic regurgitation PHT measures  776 msec. No aortic stenosis is present.  Pulmonic Valve: The pulmonic valve was normal in structure. Pulmonic valve regurgitation is trivial. No evidence of pulmonic stenosis.  Aorta: The aortic root is normal in size and structure.  Venous: The inferior vena cava is normal in size with greater than 50% respiratory variability, suggesting right atrial pressure of 3 mmHg.  IAS/Shunts: No atrial level shunt detected by color flow Doppler.   LEFT VENTRICLE PLAX 2D LVIDd:         4.00 cm   Diastology LVIDs:         2.70 cm   LV e' medial:    8.38 cm/s LV PW:         0.80 cm   LV E/e' medial:  9.1 LV IVS:        0.90 cm   LV e' lateral:   11.30 cm/s LVOT diam:     1.90 cm   LV E/e' lateral: 6.7 LV SV:         92 LV SV Index:   55        2D Longitudinal Strain LVOT Area:     2.84 cm  2D Strain GLS (A2C):   -28.6 % 2D Strain GLS (A3C):   -24.1 % 2D Strain GLS (A4C):   -27.6 % 2D Strain GLS Avg:     -26.8 %  3D Volume EF: 3D EF:        73 % LV EDV:       154 ml LV ESV:       42 ml LV SV:        112 ml  RIGHT VENTRICLE RV Basal diam:  4.30 cm RV Mid diam:    3.50 cm RV S prime:     15.30 cm/s TAPSE (M-mode): 2.9 cm  LEFT ATRIUM             Index        RIGHT ATRIUM           Index LA diam:        3.80 cm 2.26 cm/m   RA Area:     18.40 cm LA Vol (A2C):   82.5 ml 48.96 ml/m  RA Volume:   52.10 ml  30.92 ml/m LA Vol (A4C):   77.0 ml 45.70 ml/m LA Biplane Vol: 82.8 ml 49.14 ml/m AORTIC VALVE LVOT Vmax:   141.00 cm/s LVOT Vmean:  86.800 cm/s LVOT VTI:    0.326 m AI PHT:      776 msec  AORTA Ao Root diam: 2.80 cm Ao Asc diam:  3.10 cm  MITRAL VALVE               TRICUSPID VALVE MV Area (PHT)  cm          TR Peak grad:   22.7 mmHg MV Decel Time: 202 msec    TR Vmax:        238.00 cm/s MV E velocity: 76.20 cm/s MV A velocity: 70.20 cm/s  SHUNTS MV E/A ratio:  1.09        Systemic VTI:  0.33 m Systemic Diam: 1.90 cm  Chilton Si MD Electronically signed by Chilton Si MD Signature Date/Time: 03/19/2022/3:31:18 PM    Final    MONITORS  CARDIAC EVENT MONITOR 06/07/2020  Narrative  Patient had a minimum heart rate of 39 bpm, maximum heart rate of 180 bpm, and average heart rate of 53 bpm.  Predominant underlying rhythm was sinus rhythm.  Two runs of nonsustained ventricular tachycardia occurred lasting 6 beats at longest with a max rate of 180 bpm at fastest.  Isolated PACs were rare (<1.0%).  Isolated PVCs were rare (<1.0%).  No evidence of atrial fibrillation.  Triggered and diary events associated with sinus rhythm, sinus bradycardia, and PACs.  Rare, symptomatic PACs.             Recent Labs: 11/11/2022: ALT 12; BUN 15; Creatinine, Ser 0.61; Hemoglobin 12.2; Platelets 261.0; Potassium 4.4; Sodium 135  Recent Lipid Panel    Component Value Date/Time   CHOL 195 11/11/2022 1517   CHOL 156 05/20/2022 0931   TRIG 62.0 11/11/2022 1517   HDL 70.60 11/11/2022 1517   HDL 69 05/20/2022 0931   CHOLHDL 3 11/11/2022 1517   VLDL 12.4 11/11/2022 1517   LDLCALC 112 (H) 11/11/2022 1517   LDLCALC 76 05/20/2022 0931     Physical Exam:    VS:  BP 120/62   Pulse (!) 54   Ht 5' 4.5" (1.638 m)   Wt 138 lb (62.6 kg)   SpO2 96%   BMI 23.32 kg/m     Wt Readings from Last 3 Encounters:  01/10/23 138 lb (62.6 kg)  11/11/22 137 lb (62.1 kg)  10/14/22 132 lb (59.9 kg)   Gen: No distress Neck: No JVD Cardiac: No Rubs or Gallops, holodiastolic murmur, regular bradycardia, +2 radial pulses Respiratory: Clear to auscultation bilaterally, normal effort, normal  respiratory rate GI: Soft, nontender, non-distended  MS: No edema;  moves all extremities Integument: Skin  feels warm Neuro:  At time of evaluation, alert and oriented to person/place/time/situation  Psych: Normal affect, patient feels well  ASSESSMENT:    1. Aortic atherosclerosis (HCC)   2. PAC (premature atrial contraction)   3. Nonrheumatic aortic valve insufficiency   4. PSVT (paroxysmal supraventricular tachycardia) (HCC)     PLAN:    Aortic atherosclerosis Hyperlipidemia (mixed) -LDL goal less than 70 but did not tolerated statin greater than 5 mg atorvastatin - patient felt unwell with zetia; this may have not been due to the medication - she will try her PCKS9i and if fails will come in (she has asked to know specifically who she will be meeting with) to discuss inclisiran. three months   Mild Aortic Insufficiency Mid Mitral Regurgitation PFO - asymptomatic - echo in 2025  Paroxysmal Atrial Fibrillation  P-SVT PACs Hypothyroidism on synthroid  - Risk factors include gender and female - CHADSVASC=2. - repeat non live ziopatch; would start [facility] if AF returns  Time Spent Directly with Patient:   I have spent a total of 40 minutes with the patient reviewing notes, imaging, EKGs, labs and examining the patient as well as establishing an assessment and plan that was discussed personally with the patient.  > 50% of time was spent in direct patient care and family and reviewing imaging with patient.    Medication Adjustments/Labs and Tests Ordered: Current medicines are reviewed at length with the patient today.  Concerns regarding medicines are outlined above.  Orders Placed This Encounter  Procedures   EKG 12-Lead   No orders of the defined types were placed in this encounter.   There are no Patient Instructions on file for this visit.   Signed, Christell Constant, MD  01/10/2023 9:04 AM    [facility]

## 2023-01-10 NOTE — Patient Instructions (Signed)
Medication Instructions:  Your physician recommends that you continue on your current medications as directed. Please refer to the Current Medication list given to you today. PLEASE start taking Praulent as ordered.  *If you need a refill on your cardiac medications before your next appointment, please call your pharmacy*   Lab Work: NONE If you have labs (blood work) drawn today and your tests are completely normal, you will receive your results only by: MyChart Message (if you have MyChart) OR A paper copy in the mail If you have any lab test that is abnormal or we need to change your treatment, we will call you to review the results.   Testing/Procedures: Your physician has requested that you wear a heart monitor.   Follow-Up: At [facility], you and your health needs are our priority.  As part of our continuing mission to provide you with exceptional heart care, we have created designated Provider Care Teams.  These Care Teams include your primary Cardiologist (physician) and Advanced Practice Providers (APPs -  Physician Assistants and Nurse Practitioners) who all work together to provide you with the care you need, when you need it.    Your next appointment:   1 year(s)  Provider:   Christell Constant, MD     Other Instructions Crystal Russell- Long Term Monitor Instructions  Your physician has requested you wear a ZIO patch monitor for 7 days.  This is a single patch monitor. [employer] supplies one patch monitor per enrollment. Additional stickers are not available. Please do not apply patch if you will be having a Nuclear Stress Test,  Echocardiogram, Cardiac CT, MRI, or Chest Xray during the period you would be wearing the  monitor. The patch cannot be worn during these tests. You cannot remove and re-apply the  ZIO XT patch monitor.  Your ZIO patch monitor will be mailed 3 day USPS to your address on file. It may take 3-5 days  to receive your monitor after you  have been enrolled.  Once you have received your monitor, please review the enclosed instructions. Your monitor  has already been registered assigning a specific monitor serial # to you.  Billing and Patient Assistance Program Information  We have supplied [employer] with any of your insurance information on file for billing purposes. [employer] offers a sliding scale Patient Assistance Program for patients that do not have  insurance, or whose insurance does not completely cover the cost of the ZIO monitor.  You must apply for the Patient Assistance Program to qualify for this discounted rate.  To apply, please call [employer] at [phone], select option 4, select option 2, ask to apply for  Patient Assistance Program. Crystal Russell will ask your household income, and how many people  are in your household. They will quote your out-of-pocket cost based on that information.  [employer] will also be able to set up a 76-month, interest-free payment plan if needed.  Applying the monitor   Shave hair from upper left chest.  Hold abrader disc by orange tab. Rub abrader in 40 strokes over the upper left chest as  indicated in your monitor instructions.  Clean area with 4 enclosed alcohol pads. Let dry.  Apply patch as indicated in monitor instructions. Patch will be placed under collarbone on left  side of chest with arrow pointing upward.  Rub patch adhesive wings for 2 minutes. Remove white label marked "1". Remove the white  label marked "2". Rub patch adhesive wings for 2 additional minutes.  While looking in a mirror, press and release button in center of patch. A small green light will  flash 3-4 times. This will be your only indicator that the monitor has been turned on.  Do not shower for the first 24 hours. You may shower after the first 24 hours.  Press the button if you feel a symptom. You will hear a small click. Record Date, Time and  Symptom in the Patient Logbook.  When you are ready to  remove the patch, follow instructions on the last 2 pages of Patient  Logbook. Stick patch monitor onto the last page of Patient Logbook.  Place Patient Logbook in the blue and white box. Use locking tab on box and tape box closed  securely. The blue and white box has prepaid postage on it. Please place it in the mailbox as  soon as possible. Your physician should have your test results approximately 7 days after the  monitor has been mailed back to [facility].  Call [facility] Customer Care at [phone] if you have questions regarding  your ZIO XT patch monitor. Call them immediately if you see an orange light blinking on your  monitor.  If your monitor falls off in less than 4 days, contact our Monitor department at [phone].  If your monitor becomes loose or falls off after 4 days call [employer] at [phone] for  suggestions on securing your monitor

## 2023-01-22 DIAGNOSIS — I480 Paroxysmal atrial fibrillation: Secondary | ICD-10-CM | POA: Diagnosis not present

## 2023-01-27 ENCOUNTER — Encounter: Payer: Self-pay | Admitting: Internal Medicine

## 2023-01-27 ENCOUNTER — Other Ambulatory Visit: Payer: Self-pay

## 2023-01-27 ENCOUNTER — Ambulatory Visit (INDEPENDENT_AMBULATORY_CARE_PROVIDER_SITE_OTHER): Payer: Medicare Other | Admitting: Internal Medicine

## 2023-01-27 VITALS — BP 106/67 | HR 53 | Temp 97.8°F | Ht 64.5 in | Wt 138.0 lb

## 2023-01-27 DIAGNOSIS — Z23 Encounter for immunization: Secondary | ICD-10-CM | POA: Diagnosis not present

## 2023-01-27 DIAGNOSIS — M009 Pyogenic arthritis, unspecified: Secondary | ICD-10-CM

## 2023-01-27 NOTE — Progress Notes (Signed)
RFV: hx of right knee pji - monitoring off of abtx  Patient ID: Crystal Russell, female   DOB: 1951/01/30, 72 y.o.   MRN: [id number]  HPI 72yo F with history of right knee pji. Still monitored off of abtx. She is  Still feels she is not back to baseline routine for exercise; suspect multifactorial, deconditioned but also feels that she doesn't have good range of motion to right knee.  No longer having thrush -did get referred to evaluated mucous membrane disorder bydermatologist in [place]- Dr Reche Dixon.did a trial of tacrolimus mouthwash with fluconazole  Outpatient Encounter Medications as of 01/27/2023  Medication Sig   acetaminophen (TYLENOL) 500 MG tablet Take 1,000 mg by mouth as needed.   Alirocumab (PRALUENT) 75 MG/ML SOAJ Inject 75 mg into the skin every 14 (fourteen) days.   aspirin EC 81 MG tablet Take 81 mg by mouth daily. Swallow whole.   atorvastatin (LIPITOR) 10 MG tablet Take 0.5 tablets (5 mg total) by mouth daily. Please keep scheduled appointment for future refills. Thank you.   Coenzyme Q10 300 MG CAPS Take 300 mg by mouth every evening.   levothyroxine (SYNTHROID) 75 MCG tablet Take 75 mcg by mouth daily.   polyethylene glycol (MIRALAX / GLYCOLAX) 17 g packet Take 17 g by mouth 2 (two) times daily.   saccharomyces boulardii (FLORASTOR) 250 MG capsule Take 1 capsule (250 mg total) by mouth 2 (two) times daily.   tacrolimus (PROGRAF) 1 MG capsule as needed (gum irritation).   [DISCONTINUED] 0.9 %  sodium chloride infusion    No facility-administered encounter medications on file as of 01/27/2023.     Patient Active Problem List   Diagnosis Date Noted   Dry mouth 10/11/2021   Hyponatremia 10/11/2021   PSVT (paroxysmal supraventricular tachycardia) (HCC) 05/08/2021   Aortic atherosclerosis (HCC) 05/08/2021   Infection of prosthetic right knee joint (HCC) 09/14/2020   PAC (premature atrial contraction) 07/28/2020   Nonrheumatic aortic valve insufficiency 05/01/2020    Mild mitral regurgitation 05/01/2020   PFO (patent foramen ovale) 05/01/2020   Diverticulitis 03/16/2020   Hyperglycemia 03/16/2020   Vertigo 09/14/2019   Arthritis, septic, knee (HCC) 06/16/2017   Postmenopausal bleeding 12/06/2015   Varicose veins of bilateral lower extremities with other complications 09/11/2015   Sun-damaged skin 12/04/2014   Breast mass, left 05/16/2014   Varicose veins of bilateral lower extremities with other complications 12/08/2013   Preventative health care 08/08/2013   Hypothyroidism 08/08/2013   Atrophic vaginitis 08/08/2013   Cervical cancer screening 08/05/2013   ETD (eustachian tube dysfunction) 05/26/2013   Depression with anxiety 12/27/2012   Ventral hernia 08/05/2012   Varicose veins 08/05/2012   DJD (degenerative joint disease) 08/05/2012   Constipation 08/05/2012   Hyperlipidemia, mixed 08/05/2012   H/O gestational diabetes mellitus, not currently pregnant 08/05/2012   Chicken pox    Shingles    Paroxysmal A-fib (HCC)    Spondylolisthesis      Health Maintenance Due  Topic Date Due   Medicare Annual Wellness (AWV)  Never done   Zoster Vaccines- Shingrix (1 of 2) 11/29/1969   INFLUENZA VACCINE  11/07/2022   COVID-19 Vaccine (5 - 2023-24 season) 12/08/2022     Review of Systems Deconditioning with right knee; also wearing a holter monitor for 2 additional days to evaluate for tachy-brady syndrome Physical Exam   BP 106/67   Pulse (!) 53   Temp 97.8 F (36.6 C) (Temporal)   Ht 5' 4.5" (1.638 m)   Wt 138 lb (  62.6 kg)   SpO2 98%   BMI 23.32 kg/m    Physical Exam  Constitutional:  oriented to person, place, and time. appears well-developed and well-nourished. No distress.  HENT: [place]/AT, PERRLA, no scleral icterus Mouth/Throat: Oropharynx is clear and moist. No oropharyngeal exudate.  Ext: right knee swell healed.  Neurological: alert and oriented to person, place, and time.  Skin: Skin is warm and dry. No rash noted. No erythema.   Psychiatric: a normal mood and affect.  behavior is normal.    CBC Lab Results  Component Value Date   WBC 5.1 11/11/2022   RBC 4.25 11/11/2022   HGB 12.2 11/11/2022   HCT 37.9 11/11/2022   PLT 261.0 11/11/2022   MCV 89.2 11/11/2022   MCH 29.3 10/14/2022   MCHC 32.3 11/11/2022   RDW 12.8 11/11/2022   LYMPHSABS 1.7 11/11/2022   MONOABS 0.4 11/11/2022   EOSABS 0.1 11/11/2022    BMET Lab Results  Component Value Date   NA 135 11/11/2022   K 4.4 11/11/2022   CL 98 11/11/2022   CO2 30 11/11/2022   GLUCOSE 79 11/11/2022   BUN 15 11/11/2022   CREATININE 0.61 11/11/2022   CALCIUM 9.3 11/11/2022   GFRNONAA >60 11/27/2020   GFRAA >60 09/19/2019      Assessment and Plan Hx of right knee pji = will check sed rate and crp to see that still WNL.  Health maintenance = to receive Flu vaccine today  Deconditioning = recommend to continue to increase exercise tolerance.  Follow up with ortho to see if anything can be done for symptoms of limited rom  I have personally spent 30 minutes involved in face-to-face and non-face-to-face activities for this patient on the day of the visit. Professional time spent includes the following activities: Preparing to see the patient (review of tests), Obtaining and/or reviewing separately obtained history (admission/discharge record), Performing a medically appropriate examination and/or evaluation , Ordering medications/tests/procedures, referring and communicating with other health care professionals, Documenting clinical information in the EMR, Independently interpreting results (not separately reported), Communicating results to the patient/family/caregiver, Counseling and educating the patient/family/caregiver and Care coordination (not separately reported).

## 2023-01-28 LAB — C-REACTIVE PROTEIN: CRP: <3 mg/L (ref <8.0)

## 2023-01-28 LAB — SEDIMENTATION RATE: Sed Rate: 11 mm/h (ref 0–30)

## 2023-02-05 DIAGNOSIS — I480 Paroxysmal atrial fibrillation: Secondary | ICD-10-CM | POA: Diagnosis not present

## 2023-02-14 ENCOUNTER — Encounter: Payer: Self-pay | Admitting: Internal Medicine

## 2023-02-14 MED ORDER — PRALUENT 75 MG/ML ~~LOC~~ SOAJ: 75.0000 mg | SUBCUTANEOUS | 3 refills | Status: DC

## 2023-02-18 ENCOUNTER — Telehealth: Payer: Self-pay | Admitting: Internal Medicine

## 2023-02-18 NOTE — Telephone Encounter (Signed)
Pt c/o medication issue:  1. Name of Medication:   Alirocumab (PRALUENT) 75 MG/ML SOAJ    2. How are you currently taking this medication (dosage and times per day)? Inject 1 mL (75 mg total) into the skin every 14 (fourteen) days.   3. Are you having a reaction (difficulty breathing--STAT)? No  4. What is your medication issue? Patient was approved for patient assistance and would like to know how she can get this medication with the patient assistance. Please advise.

## 2023-02-19 NOTE — Telephone Encounter (Signed)
Enrolled patient in [facility]   End Date 01/27/2024   Card No. [id number]  BIN [id number] PCN [id number] PC [facility] [id number]

## 2023-02-24 ENCOUNTER — Telehealth: Payer: Self-pay | Admitting: Gastroenterology

## 2023-02-24 MED ORDER — AMOXICILLIN-POT CLAVULANATE 875-125 MG PO TABS: 1.0000 | ORAL_TABLET | Freq: Two times a day (BID) | ORAL | 0 refills | Status: DC

## 2023-02-24 NOTE — Telephone Encounter (Signed)
Sorry to hear this. Recommend we start antibiotics empirically for suspected diverticulitis. Augmentin twice daily for one week, if you can order that for her (I see a cipro intolerance listed). If no better she should call us. Continue with Miralax. Thanks

## 2023-02-24 NOTE — Telephone Encounter (Signed)
Patient with hx of severe diverticulosis seen on 08/2022 colonoscopy; hx recurrent diverticulitis.  Patient calls with complaints of intermittent lower abdominal pain that began Saturday night; became constant on Sunday and can be throughout the entire abdomen at this time. "Achy and stabbing" pain. Patient states that she has had constipation x several days and recently started miralax. Has had 1 bowel movement with that, non-bloody. Patietn describes chills and slight fever (less than 100F) yesterday. Has been taking Tylenol which has helped both the discomfort and fever somewhat. Has started clear liquid only diet.  Dr Adela Lank, please advise.Marland KitchenMarland KitchenMarland Kitchen

## 2023-02-24 NOTE — Telephone Encounter (Addendum)
Discussed augmentin recommendation with patient for presumed diverticulitis flare. Patient verbalizes understanding of this information. She is advised that should she develop worsening symptoms or increasing fever despite antibiotics, she should go to the emergency room for more urgent evaluation.

## 2023-02-24 NOTE — Telephone Encounter (Signed)
PT is having a diverticulosis flare. She is taking miralax and experiencing a lot of pain. Please advise.

## 2023-03-03 DIAGNOSIS — E063 Autoimmune thyroiditis: Secondary | ICD-10-CM | POA: Diagnosis not present

## 2023-03-31 ENCOUNTER — Encounter: Payer: Self-pay | Admitting: Internal Medicine

## 2023-04-02 ENCOUNTER — Other Ambulatory Visit: Payer: Self-pay | Admitting: Internal Medicine

## 2023-04-24 ENCOUNTER — Ambulatory Visit: Payer: Medicare Other | Attending: Cardiovascular Disease | Admitting: Pharmacist

## 2023-04-24 DIAGNOSIS — E782 Mixed hyperlipidemia: Secondary | ICD-10-CM | POA: Diagnosis not present

## 2023-04-24 DIAGNOSIS — I700 Atherosclerosis of aorta: Secondary | ICD-10-CM | POA: Insufficient documentation

## 2023-04-24 NOTE — Patient Instructions (Signed)
I will submit a prior authorization for Nexletol. Please continue atorvastatin 5mg  daily  Please send my a mychart with any questions

## 2023-04-24 NOTE — Progress Notes (Signed)
Patient ID: Crystal Russell                 DOB: [birth year]                    MRN: [id number]     HPI: Crystal Russell is a 73 y.o. female patient referred to lipid clinic by Dr. Izora Ribas. PMH is significant for paroxysmal afib, DVT, HLD, hypothyroidism, migraines, aortic atherosclerosis, PFO, mild mitral regurgitation, and mild-moderate aortic regurgitation. Last echocardiogram on 03/14/21 showed LVEF of 60-65%, grade I diastolic dysfunction, and normal RV function. She has a history of intolerance to rosuvastatin and pravastatin, but has been able to tolerate atorvastatin 5 mg daily. Patient was seen by Dr. Izora Ribas on 11/13/21 at which time Zetia 10 mg daily was added due to LDL of 110. At follow up 3 months after starting Zetia, LDL remained above goal at 84. She was seen by the PharmD clinic 02/25/22. She was prescribed PCSK9i, but did not start taking it due to fear of side effects. Stopped Zetia because she felt it might be contributing to a decline in her mental health. Saw Dr. Izora Ribas 01/10/23 and was agreeable to trying. However, she developed a rash at the injection site that worsened with each injection.  She was scheduled in clinic to discuss alternative options.  Patient presents today to lipid clinic accompanied by her husband.  They are from [place] originally.  Patient has had many health issues and complications including an infected knee joint requiring various IV antibiotics due to side effects and bouts with diverticulitis.  She is very leery of medication and very hesitant to try new medications.  She does not want to do any injections.  We discussed Nexletol is side effects, LDL-C reduction, monitoring parameters.  Not interested in [place].  She is currently on 5 mg atorvastatin daily.  Last a good amount of weight with weight watchers and has kept it off.  Diet is low in fat and high in vegetables.  She walks 1 to 1-1/2 hours/day.  No resistance training but she is aware of  the importance of adding this.  Current Medications: atorvastatin 5 mg daily  Intolerances: rosuvastatin unknown dose (joint pain and inner thigh pain) and pitavastatin 2 mg daily (joint pain), and atorvastatin 10 mg daily (joint pain), pravastatin unknown dose (joint pain), ezetimibe (mentally unwell), praluent (injection site reaction) Risk Factors: aortic atherosclerosis LDL goal: <70 mg/dL  Diet:  Breakfast: low fat cottage cheese, black coffee, fruit, or oatmeal Lunch: salad, vegetable soup, chicken wrap, rarely eggs Dinner: chicken or fish, veggies Drink: selter, tea with truvia  Exercise:  Walks daily for 1-1.5 hours  Family History:  Family History  Problem Relation Age of Onset   Cancer Mother        lung- smoker   Liver disease Mother    Cancer Father        lung- smoker   Peripheral Artery Disease Sister    Heart attack Paternal Uncle    Dementia Maternal Grandmother    Heart attack Maternal Grandfather    Asthma Son    Ulcerative colitis Son    Asthma Son    Ulcerative colitis Son    Pancreatic cancer Other    Stomach cancer Other    Colon cancer Neg Hx    Cystic fibrosis Neg Hx    Esophageal cancer Neg Hx     Social History:  Social History   Socioeconomic History   Marital status:  Married    Spouse name: Not on file   Number of children: Not on file   Years of education: Not on file   Highest education level: Not on file  Occupational History   Not on file  Tobacco Use   Smoking status: Former    Types: Cigarettes   Smokeless tobacco: Never  Vaping Use   Vaping status: Never Used  Substance and Sexual Activity   Alcohol use: No   Drug use: No   Sexual activity: Yes  Other Topics Concern   Not on file  Social History Narrative   Not on file   Social Drivers of Health   Financial Resource Strain: Not on file  Food Insecurity: Not on file  Transportation Needs: Not on file  Physical Activity: Not on file  Stress: Not on file  Social  Connections: Not on file  Intimate Partner Violence: Not on file    Labs: 11/11/22 TC 195, TG 62, HDL 70, LDL-C 112 (atorvastatin 5 mg daily) 02/13/22 on atorvastatin 5 mg daily and ezetimibe 10 mg daily: TC 157, TG 48, HDL 63, VLDL 10, LDL 84, ALT 52  03/29/21 on atorvastatin 5 mg daily: TC 189, TG 52, HDL 69, VLDL 10.4, LDL 110, ALT 86  Past Medical History:  Diagnosis Date   Abdominal wall hernia 08/04/2012   Atrial fibrillation (HCC) 2008   REPORTS SHE WAS AT [place], UPON WAKING I FELT MY HEART GOING BOOM BOOM BOOM BEATING OUT OF MY CHEST. I WENT TO HOSPITAL AND THEY ISED MEDICINE TO CONVERT ME , IT TOOK OVER 10 HOURS TO CONVERT ME. BUT AFTER THAT IVE NEVER HAD ANY ISSUES SINCE. I USED TO HAVE A CARDIOLOGUIST AS MY PRIMARY IN [place] BUT IHAVENT BEEN TO A CARDIOLOGIST SINCE I MOVED DOWN HERE.    Chicken pox as a child   Depression    Depression with anxiety 12/27/2012   Diverticulitis    DJD (degenerative joint disease) 08/05/2012   DVT (deep venous thrombosis) (HCC) 2018   Fifth disease 1989   DX AFTER EXPOSURE FROM HER CHILDREN; DEVELOPED RHEUMATOID ARTHRITIS DURING COURSE OF ILLNESS. REPORTS:  " I DONT HAVE IT ANYMORE SINCE THE DISEASE IS GONE"    H/O gestational diabetes mellitus, not currently pregnant 08/05/2012   History of    Heart murmur    echo 2012; "I HAVE A FUNCTIONAL MURMUR"    Hyperlipidemia    Hypothyroidism    Knee effusion, right    while on Eliquis, discontinued Eliquis after DVT resolved   Measles as a child   Migraines    Other and unspecified hyperlipidemia 08/05/2012   Paroxysmal A-fib (HCC)    Shingles 43   Spondylisthesis 2012   L5/S1 grade 3   Varicose veins 08/05/2012   Extending up to right groin B/l LE   Ventral hernia 08/05/2012   Wears glasses     Current Outpatient Medications on File Prior to Visit  Medication Sig Dispense Refill   acetaminophen (TYLENOL) 500 MG tablet Take 1,000 mg by mouth as needed.     Alirocumab (PRALUENT) 75 MG/ML SOAJ  Inject 1 mL (75 mg total) into the skin every 14 (fourteen) days. 6 mL 3   amoxicillin-clavulanate (AUGMENTIN) 875-125 MG tablet Take 1 tablet by mouth 2 (two) times daily. 14 tablet 0   aspirin EC 81 MG tablet Take 81 mg by mouth daily. Swallow whole.     atorvastatin (LIPITOR) 10 MG tablet TAKE ONE-HALF TABLET BY MOUTH DAILY 45  tablet 2   Coenzyme Q10 300 MG CAPS Take 300 mg by mouth every evening.     levothyroxine (SYNTHROID) 75 MCG tablet Take 75 mcg by mouth daily.     polyethylene glycol (MIRALAX / GLYCOLAX) 17 g packet Take 17 g by mouth 2 (two) times daily.     saccharomyces boulardii (FLORASTOR) 250 MG capsule Take 1 capsule (250 mg total) by mouth 2 (two) times daily. 60 capsule 0   tacrolimus (PROGRAF) 1 MG capsule as needed (gum irritation).     No current facility-administered medications on file prior to visit.    Allergies  Allergen Reactions   Ciprofloxacin Other (See Comments)    Abdominal pain-06/17/22-patient states she is not allergic, no rash or swelling    Crestor [Rosuvastatin] Other (See Comments)    Joint pain w/fibromyalgia   Pravachol [Pravastatin] Other (See Comments)    Joint pain w/fibromyalgia   Cefazolin Rash   Rifampin Rash    Assessment/Plan:  1. Hyperlipidemia - LDL of 112 is above goal < 70 on atorvastatin 5 mg daily.  We discussed the benefits and risks of Nexletol.  Patient request we submit a prior authorization for this while she still think about it for a little while.  She can use her health will grant which she was using for Praluent on the Nexletol.  Recommended she establish a resistance training regimen.  Thank you for allowing pharmacy to participate in this patient's care.  Olene Floss, Pharm.D, BCACP, CPP [facility] of [facility] [address], [place], [place] [address]  Phone: [phone]; Fax: [phone]

## 2023-04-25 ENCOUNTER — Other Ambulatory Visit (HOSPITAL_COMMUNITY): Payer: Self-pay

## 2023-04-25 ENCOUNTER — Telehealth: Payer: Self-pay | Admitting: Pharmacy Technician

## 2023-04-25 NOTE — Telephone Encounter (Signed)
-----   Message from Olene Floss sent at 04/24/2023 12:37 PM EST ----- Please do PA for Nexletol.  E78.5

## 2023-04-25 NOTE — Telephone Encounter (Signed)
Pharmacy Patient Advocate Encounter   Received notification from Physician's Office that prior authorization for nexletol is required/requested.   Insurance verification completed.   The patient is insured through  [employer]  .   Per test claim: PA required; PA submitted to above mentioned insurance via CoverMyMeds Key/confirmation #/EOC [id number] Status is pending

## 2023-04-28 ENCOUNTER — Other Ambulatory Visit (HOSPITAL_COMMUNITY): Payer: Self-pay

## 2023-04-28 ENCOUNTER — Ambulatory Visit: Payer: Medicare Other | Admitting: Internal Medicine

## 2023-04-28 NOTE — Telephone Encounter (Signed)
Pharmacy Patient Advocate Encounter  Received notification from  [facility]  that Prior Authorization for nexletol has been APPROVED from 04/25/23 to until further notice. Ran test claim, Copay is $375.55 one month (DEDUCTIBLE). This test claim was processed through [facility]- copay amounts may vary at other pharmacies due to pharmacy/plan contracts, or as the patient moves through the different stages of their insurance plan.   PA #/Case ID/Reference #: [id number]

## 2023-04-29 ENCOUNTER — Encounter: Payer: Self-pay | Admitting: Pharmacist

## 2023-04-30 ENCOUNTER — Encounter: Payer: Self-pay | Admitting: Internal Medicine

## 2023-04-30 ENCOUNTER — Ambulatory Visit: Payer: Medicare Other | Admitting: Internal Medicine

## 2023-04-30 ENCOUNTER — Other Ambulatory Visit: Payer: Self-pay

## 2023-04-30 VITALS — BP 146/82 | HR 54 | Temp 97.9°F | Resp 16

## 2023-04-30 DIAGNOSIS — T466X5A Adverse effect of antihyperlipidemic and antiarteriosclerotic drugs, initial encounter: Secondary | ICD-10-CM | POA: Diagnosis not present

## 2023-04-30 DIAGNOSIS — Z8719 Personal history of other diseases of the digestive system: Secondary | ICD-10-CM | POA: Diagnosis not present

## 2023-04-30 DIAGNOSIS — E7800 Pure hypercholesterolemia, unspecified: Secondary | ICD-10-CM

## 2023-04-30 DIAGNOSIS — T8450XS Infection and inflammatory reaction due to unspecified internal joint prosthesis, sequela: Secondary | ICD-10-CM

## 2023-04-30 DIAGNOSIS — Z96651 Presence of right artificial knee joint: Secondary | ICD-10-CM

## 2023-04-30 NOTE — Progress Notes (Signed)
RFV: follow up for pji  Patient ID: Crystal Russell, female   DOB: [birth year], 73 y.o.   MRN: [id number]  HPI   Crystal Russell is a 73yo F with hx of remote pji of right knee s/p DAIR, where she was on prolonged suppression but now has been off of abtx for several months. We have been monitoring her off of abtx to ensure no recurrence. She did have episdoe of diverticulitis - treated with augmentin in November. No issues with tolerating amox/clav. Usually triggered by constipation. She titrates miralax daily to help with constipation.  In terms of her knee, she notices that her  Knee has less range of motion because less exercise. 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  She has had injection site inflammation from her injectable cholesterol medication that she has decided to stop   Outpatient Encounter Medications as of 04/30/2023  Medication Sig   acetaminophen (TYLENOL) 500 MG tablet Take 1,000 mg by mouth as needed.   aspirin EC 81 MG tablet Take 81 mg by mouth daily. Swallow whole.   atorvastatin (LIPITOR) 10 MG tablet TAKE ONE-HALF TABLET BY MOUTH DAILY   Coenzyme Q10 300 MG CAPS Take 300 mg by mouth every evening.   levothyroxine (SYNTHROID) 75 MCG tablet Take 75 mcg by mouth daily.   polyethylene glycol (MIRALAX / GLYCOLAX) 17 g packet Take 17 g by mouth 2 (two) times daily.   saccharomyces boulardii (FLORASTOR) 250 MG capsule Take 1 capsule (250 mg total) by mouth 2 (two) times daily.   Alirocumab (PRALUENT) 75 MG/ML SOAJ Inject 1 mL (75 mg total) into the skin every 14 (fourteen) days.   amoxicillin-clavulanate (AUGMENTIN) 875-125 MG tablet Take 1 tablet by mouth 2 (two) times daily. (Patient not taking: Reported on 04/30/2023)   tacrolimus (PROGRAF) 1 MG capsule as needed (gum irritation). (Patient not taking: Reported on 04/30/2023)   No facility-administered encounter medications on file as of 04/30/2023.     Patient Active Problem List   Diagnosis Date Noted   Dry mouth 10/11/2021    Hyponatremia 10/11/2021   PSVT (paroxysmal supraventricular tachycardia) (HCC) 05/08/2021   Aortic atherosclerosis (HCC) 05/08/2021   Infection of prosthetic right knee joint (HCC) 09/14/2020   PAC (premature atrial contraction) 07/28/2020   Nonrheumatic aortic valve insufficiency 05/01/2020   Mild mitral regurgitation 05/01/2020   PFO (patent foramen ovale) 05/01/2020   Diverticulitis 03/16/2020   Hyperglycemia 03/16/2020   Vertigo 09/14/2019   Arthritis, septic, knee (HCC) 06/16/2017   Postmenopausal bleeding 12/06/2015   Varicose veins of bilateral lower extremities with other complications 09/11/2015   Sun-damaged skin 12/04/2014   Breast mass, left 05/16/2014   Varicose veins of bilateral lower extremities with other complications 12/08/2013   Preventative health care 08/08/2013   Hypothyroidism 08/08/2013   Atrophic vaginitis 08/08/2013   Cervical cancer screening 08/05/2013   ETD (eustachian tube dysfunction) 05/26/2013   Depression with anxiety 12/27/2012   Ventral hernia 08/05/2012   Varicose veins 08/05/2012   DJD (degenerative joint disease) 08/05/2012   Constipation 08/05/2012   Hyperlipidemia, mixed 08/05/2012   H/O gestational diabetes mellitus, not currently pregnant 08/05/2012   Chicken pox    Shingles    Paroxysmal A-fib (HCC)    Spondylolisthesis      Health Maintenance Due  Topic Date Due   Medicare Annual Wellness (AWV)  Never done   Zoster Vaccines- Shingrix (1 of 2) 11/29/1969   COVID-19 Vaccine (5 - 2024-25 season) 12/08/2022     Review of Systems Review of  Systems  Constitutional: Negative for fever, chills, diaphoresis, activity change, appetite change, fatigue and unexpected weight change.  HENT: Negative for congestion, sore throat, rhinorrhea, sneezing, trouble swallowing and sinus pressure.  Eyes: Negative for photophobia and visual disturbance.  Respiratory: Negative for cough, chest tightness, shortness of breath, wheezing and stridor.   Cardiovascular: Negative for chest pain, palpitations and leg swelling.  Gastrointestinal: Negative for nausea, vomiting, abdominal pain, diarrhea, constipation, blood in stool, abdominal distention and anal bleeding.  Genitourinary: Negative for dysuria, hematuria, flank pain and difficulty urinating.  Musculoskeletal: Negative for myalgias, back pain, joint swelling, arthralgias and gait problem.  Skin: Negative for color change, pallor, rash and wound.  Neurological: Negative for dizziness, tremors, weakness and light-headedness.  Hematological: Negative for adenopathy. Does not bruise/bleed easily.  Psychiatric/Behavioral: Negative for behavioral problems, confusion, sleep disturbance, dysphoric mood, decreased concentration and agitation.   Physical Exam   BP (!) 146/82   Pulse (!) 54   Temp 97.9 F (36.6 C) (Temporal)   Resp 16   SpO2 100%   Gen = a xo by 3 in nad Ext: no warmth or swelling to right knee in comparison to left  CBC Lab Results  Component Value Date   WBC 5.1 11/11/2022   RBC 4.25 11/11/2022   HGB 12.2 11/11/2022   HCT 37.9 11/11/2022   PLT 261.0 11/11/2022   MCV 89.2 11/11/2022   MCH 29.3 10/14/2022   MCHC 32.3 11/11/2022   RDW 12.8 11/11/2022   LYMPHSABS 1.7 11/11/2022   MONOABS 0.4 11/11/2022   EOSABS 0.1 11/11/2022    BMET Lab Results  Component Value Date   NA 135 11/11/2022   K 4.4 11/11/2022   CL 98 11/11/2022   CO2 30 11/11/2022   GLUCOSE 79 11/11/2022   BUN 15 11/11/2022   CREATININE 0.61 11/11/2022   CALCIUM 9.3 11/11/2022   GFRNONAA >60 11/27/2020   GFRAA >60 09/19/2019      Assessment and Plan  Removed septic arthritis knee from 06/2017 -- due to error - on problem list.   Hx of pji of knee = continue to monitor off of abtx, will check sed rate and crp  Injection site inflammation = picture appears healing. Agree with her decision to stop medication. Lipid profile only shows slightly elevated LDL. Can continue with low dose  atorvastatin and diet modification  History of diverticulitis = will continue avoiding seeds and doing miralax

## 2023-05-01 LAB — SEDIMENTATION RATE: Sed Rate: 9 mm/h (ref 0–30)

## 2023-05-01 LAB — C-REACTIVE PROTEIN: CRP: <3 mg/L (ref <8.0)

## 2023-06-01 NOTE — Assessment & Plan Note (Signed)
 Encouraged increased hydration and fiber in diet. Daily probiotics. If bowels not moving can use MOM 2 tbls po in 4 oz of warm prune juice by mouth every 2-3 days. If no results then repeat in 4 hours with  Dulcolax suppository pr, may repeat again in 4 more hours as needed. Seek care if symptoms worsen. Using Miralax

## 2023-06-01 NOTE — Assessment & Plan Note (Signed)
 Tolerating statin, encouraged heart healthy diet, avoid trans fats, minimize simple carbs and saturated fats. Increase exercise as tolerated. Did not tolerate Zetia with mental status changes so stopped and she has not started the Praluent yet.

## 2023-06-01 NOTE — Assessment & Plan Note (Signed)
 hgba1c acceptable, minimize simple carbs. Increase exercise as tolerated.

## 2023-06-01 NOTE — Assessment & Plan Note (Signed)
 On Levothyroxine, continue to monitor

## 2023-06-02 ENCOUNTER — Ambulatory Visit (INDEPENDENT_AMBULATORY_CARE_PROVIDER_SITE_OTHER): Payer: Medicare Other | Admitting: Family Medicine

## 2023-06-02 ENCOUNTER — Encounter: Payer: Self-pay | Admitting: Family Medicine

## 2023-06-02 VITALS — BP 108/76 | HR 72 | Temp 97.8°F | Resp 18 | Ht 64.0 in | Wt 136.0 lb

## 2023-06-02 DIAGNOSIS — K5900 Constipation, unspecified: Secondary | ICD-10-CM

## 2023-06-02 DIAGNOSIS — E782 Mixed hyperlipidemia: Secondary | ICD-10-CM | POA: Diagnosis not present

## 2023-06-02 DIAGNOSIS — I480 Paroxysmal atrial fibrillation: Secondary | ICD-10-CM

## 2023-06-02 DIAGNOSIS — E039 Hypothyroidism, unspecified: Secondary | ICD-10-CM

## 2023-06-02 DIAGNOSIS — I700 Atherosclerosis of aorta: Secondary | ICD-10-CM | POA: Diagnosis not present

## 2023-06-02 DIAGNOSIS — R739 Hyperglycemia, unspecified: Secondary | ICD-10-CM | POA: Diagnosis not present

## 2023-06-02 DIAGNOSIS — R7982 Elevated C-reactive protein (CRP): Secondary | ICD-10-CM

## 2023-06-02 LAB — CBC WITH DIFFERENTIAL/PLATELET
Basophils Absolute: 0 10*3/uL (ref 0.0–0.1)
Basophils Relative: 0.7 % (ref 0.0–3.0)
Eosinophils Absolute: 0.1 10*3/uL (ref 0.0–0.7)
Eosinophils Relative: 1.8 % (ref 0.0–5.0)
HCT: 39.2 % (ref 36.0–46.0)
Hemoglobin: 13.4 g/dL (ref 12.0–15.0)
Lymphocytes Relative: 32 % (ref 12.0–46.0)
Lymphs Abs: 1.3 10*3/uL (ref 0.7–4.0)
MCHC: 34.3 g/dL (ref 30.0–36.0)
MCV: 87.5 fl (ref 78.0–100.0)
Monocytes Absolute: 0.4 10*3/uL (ref 0.1–1.0)
Monocytes Relative: 9 % (ref 3.0–12.0)
Neutro Abs: 2.3 10*3/uL (ref 1.4–7.7)
Neutrophils Relative %: 56.5 % (ref 43.0–77.0)
Platelets: 257 10*3/uL (ref 150.0–400.0)
RBC: 4.48 Mil/uL (ref 3.87–5.11)
RDW: 12.6 % (ref 11.5–15.5)
WBC: 4 10*3/uL (ref 4.0–10.5)

## 2023-06-02 LAB — COMPREHENSIVE METABOLIC PANEL
ALT: 15 U/L (ref 0–35)
AST: 25 U/L (ref 0–37)
Albumin: 4.4 g/dL (ref 3.5–5.2)
Alkaline Phosphatase: 62 U/L (ref 39–117)
BUN: 13 mg/dL (ref 6–23)
CO2: 31 meq/L (ref 19–32)
Calcium: 9.3 mg/dL (ref 8.4–10.5)
Chloride: 96 meq/L (ref 96–112)
Creatinine, Ser: 0.63 mg/dL (ref 0.40–1.20)
GFR: 88.57 mL/min (ref >60.00)
Glucose, Bld: 91 mg/dL (ref 70–99)
Potassium: 4.5 meq/L (ref 3.5–5.1)
Sodium: 134 meq/L — ABNORMAL LOW (ref 135–145)
Total Bilirubin: 1.1 mg/dL (ref 0.2–1.2)
Total Protein: 6.8 g/dL (ref 6.0–8.3)

## 2023-06-02 LAB — LIPID PANEL
Cholesterol: 150 mg/dL (ref 0–200)
HDL: 63.4 mg/dL (ref >39.00)
LDL Cholesterol: 77 mg/dL (ref 0–99)
NonHDL: 86.71
Total CHOL/HDL Ratio: 2
Triglycerides: 49 mg/dL (ref 0.0–149.0)
VLDL: 9.8 mg/dL (ref 0.0–40.0)

## 2023-06-02 LAB — T4, FREE: Free T4: 1.01 ng/dL (ref 0.60–1.60)

## 2023-06-02 LAB — TSH: TSH: 3.82 u[IU]/mL (ref 0.35–5.50)

## 2023-06-02 LAB — HEMOGLOBIN A1C: Hgb A1c MFr Bld: 5.5 % (ref 4.6–6.5)

## 2023-06-02 NOTE — Patient Instructions (Addendum)
 maintain increased hydration and fiber in diet. Daily probiotics. If bowels not moving can use MOM 2 tbls po in 4 oz of warm prune juice by mouth every 2-3 days. If no results then repeat in 4 hours with  Dulcolax suppository pr, may repeat again in 4 more hours as needed. Seek care if symptoms worsen. Consider daily Miralax and/or Dulcolax if symptoms persist.     Hypothyroidism  Hypothyroidism is when the thyroid gland does not make enough of certain hormones. This is called an underactive thyroid. The thyroid gland is a small gland located in the lower front part of the neck, just in front of the windpipe (trachea). This gland makes hormones that help control how the body uses food for energy (metabolism) as well as how the heart and brain function. These hormones also play a role in keeping your bones strong. When the thyroid is underactive, it produces too little of the hormones thyroxine (T4) and triiodothyronine (T3). What are the causes? This condition may be caused by: Hashimoto's disease. This is a disease in which the body's disease-fighting system (immune system) attacks the thyroid gland. This is the most common cause. Viral infections. Pregnancy. Certain medicines. Birth defects. Problems with a gland in the center of the brain (pituitary gland). Lack of enough iodine in the diet. Other causes may include: Past radiation treatments to the head or neck for cancer. Past treatment with radioactive iodine. Past exposure to radiation in the environment. Past surgical removal of part or all of the thyroid. What increases the risk? You are more likely to develop this condition if: You are female. You have a family history of thyroid conditions. You use a medicine called lithium. You take medicines that affect the immune system (immunosuppressants). What are the signs or symptoms? Common symptoms of this condition include: Not being able to tolerate cold. Feeling as though you have  no energy (lethargy). Lack of appetite. Constipation. Sadness or depression. Weight gain that is not explained by a change in diet or exercise habits. Menstrual irregularity. Dry skin, coarse hair, or brittle nails. Other symptoms may include: Muscle pain. Slowing of thought processes. Poor memory. How is this diagnosed? This condition may be diagnosed based on: Your symptoms, your medical history, and a physical exam. Blood tests. You may also have imaging tests, such as an ultrasound or MRI. How is this treated? This condition is treated with medicine that replaces the thyroid hormones that your body does not make. After you begin treatment, it may take several weeks for symptoms to go away. Follow these instructions at home: Take over-the-counter and prescription medicines only as told by your health care provider. If you start taking any new medicines, tell your health care provider. Keep all follow-up visits as told by your health care provider. This is important. As your condition improves, your dosage of thyroid hormone medicine may change. You will need to have blood tests regularly so that your health care provider can monitor your condition. Contact a health care provider if: Your symptoms do not get better with treatment. You are taking thyroid hormone replacement medicine and you: Sweat a lot. Have tremors. Feel anxious. Lose weight rapidly. Cannot tolerate heat. Have emotional swings. Have diarrhea. Feel weak. Get help right away if: You have chest pain. You have an irregular heartbeat. You have a rapid heartbeat. You have difficulty breathing. These symptoms may be an emergency. Get help right away. Call 911. Do not wait to see if the symptoms will  go away. Do not drive yourself to the hospital. Summary Hypothyroidism is when the thyroid gland does not make enough of certain hormones (it is underactive). When the thyroid is underactive, it produces too  little of the hormones thyroxine (T4) and triiodothyronine (T3). The most common cause is Hashimoto's disease, a disease in which the body's disease-fighting system (immune system) attacks the thyroid gland. The condition can also be caused by viral infections, medicine, pregnancy, or past radiation treatment to the head or neck. Symptoms may include weight gain, dry skin, constipation, feeling as though you do not have energy, and not being able to tolerate cold. This condition is treated with medicine to replace the thyroid hormones that your body does not make. This information is not intended to replace advice given to you by your health care provider. Make sure you discuss any questions you have with your health care provider. Document Revised: 03/27/2021 Document Reviewed: 03/27/2021 Elsevier Patient Education  2024 [employer].

## 2023-06-02 NOTE — Assessment & Plan Note (Signed)
-   Is being followed by cardiology.

## 2023-06-02 NOTE — Progress Notes (Signed)
 Subjective:    Patient ID: Crystal Russell, female    DOB: [birth year], 73 y.o.   MRN: [id number]  Chief Complaint  Patient presents with   Annual Exam    HPI Discussed the use of AI scribe software for clinical note transcription with the patient, who gave verbal consent to proceed.  History of Present Illness Crystal Russell is a 73 year old female with hypothyroidism who presents with nausea and concerns about cholesterol management.  She experiences nausea primarily in the mornings over the past week, which improves after eating. She feels lightheaded and weak if she goes too long without food, describing an episode where she nearly fainted while gardening due to not eating. Attempts to alleviate symptoms with a protein shake were not significantly helpful. She has been reducing carbohydrate intake and typically has oatmeal with almond milk for breakfast.  She expresses concerns about her cholesterol levels and the side effects of cholesterol medications. Currently, she takes half a pill of atorvastatin once a day but experiences tendon cramps and pain with higher doses. She is hesitant to start new medications due to cost and side effects. She reports a painful, itchy, and raised reaction at the injection site from previous cholesterol medication, leading to discontinuation. There is a family history of heart disease, with her grandfather dying of a heart attack in his 74.  She has a history of a knee infection that required treatment and led to fluctuations in her thyroid levels. During treatment, her thyroid levels were elevated, causing heart palpitations and an irregular heartbeat. She has been off antibiotics for several months. She has a history of hypothyroidism and has previously consulted an endocrinologist without finding it beneficial. She mentions her thyroid numbers are slightly off and is unsatisfied with her direction. Blood work was done a couple of months ago, but she is  unsure of the results.  She discusses anxiety about traveling to [place] for her brother's birthday due to dietary restrictions and travel costs. Concerns include managing her diet while traveling and potential constipation. She uses a bidet at home for hygiene and is apprehensive about not having control over her environment while traveling.    Past Medical History:  Diagnosis Date   Abdominal wall hernia 08/04/2012   Atrial fibrillation (HCC) 2008   REPORTS SHE WAS AT [place], UPON WAKING I FELT MY HEART GOING BOOM BOOM BOOM BEATING OUT OF MY CHEST. I WENT TO HOSPITAL AND THEY ISED MEDICINE TO CONVERT ME , IT TOOK OVER 10 HOURS TO CONVERT ME. BUT AFTER THAT IVE NEVER HAD ANY ISSUES SINCE. I USED TO HAVE A CARDIOLOGUIST AS MY PRIMARY IN [place] BUT IHAVENT BEEN TO A CARDIOLOGIST SINCE I MOVED DOWN HERE.    Chicken pox as a child   Depression    Depression with anxiety 12/27/2012   Diverticulitis    DJD (degenerative joint disease) 08/05/2012   DVT (deep venous thrombosis) (HCC) 2018   Fifth disease 1989   DX AFTER EXPOSURE FROM HER CHILDREN; DEVELOPED RHEUMATOID ARTHRITIS DURING COURSE OF ILLNESS. REPORTS:  " I DONT HAVE IT ANYMORE SINCE THE DISEASE IS GONE"    H/O gestational diabetes mellitus, not currently pregnant 08/05/2012   History of    Heart murmur    echo 2012; "I HAVE A FUNCTIONAL MURMUR"    Hyperlipidemia    Hypothyroidism    Knee effusion, right    while on Eliquis, discontinued Eliquis after DVT resolved   Measles as a child  Migraines    Other and unspecified hyperlipidemia 08/05/2012   Paroxysmal A-fib (HCC)    Shingles 43   Spondylisthesis 2012   L5/S1 grade 3   Varicose veins 08/05/2012   Extending up to right groin B/l LE   Ventral hernia 08/05/2012   Wears glasses     Past Surgical History:  Procedure Laterality Date   BREAST DUCTAL SYSTEM EXCISION Right 05/05/2014   Procedure: RIGHT BREAST DUCT EXCISION;  Surgeon: Emelia Loron, MD;  Location: [facility];  Service: General;  Laterality: Right;   COLONOSCOPY     DILATION AND CURETTAGE OF UTERUS     x4   EXCISIONAL TOTAL KNEE ARTHROPLASTY WITH ANTIBIOTIC SPACERS Right 09/14/2020   Procedure: Irrigation and debriement with poly exchange versus resection of right total knee arthroplasty and placement of antibitiotic spacer;  Surgeon: Durene Romans, MD;  Location: [facility];  Service: Orthopedics;  Laterality: Right;   MOUTH SURGERY     TOTAL KNEE ARTHROPLASTY Right 07/01/2017   Procedure: RIGHT TOTAL KNEE ARTHROPLASTY;  Surgeon: Durene Romans, MD;  Location: [facility];  Service: Orthopedics;  Laterality: Right;  70 mins   VARICOSE VEIN SURGERY  1995   right leg    Family History  Problem Relation Age of Onset   Cancer Mother        lung- smoker   Liver disease Mother    Cancer Father        lung- smoker   Peripheral Artery Disease Sister    Heart attack Paternal Uncle    Dementia Maternal Grandmother    Heart attack Maternal Grandfather    Asthma Son    Ulcerative colitis Son    Asthma Son    Ulcerative colitis Son    Pancreatic cancer Other    Stomach cancer Other    Colon cancer Neg Hx    Cystic fibrosis Neg Hx    Esophageal cancer Neg Hx     Social History   Socioeconomic History   Marital status: Married    Spouse name: Not on file   Number of children: Not on file   Years of education: Not on file   Highest education level: Not on file  Occupational History   Not on file  Tobacco Use   Smoking status: Former    Types: Cigarettes   Smokeless tobacco: Never  Vaping Use   Vaping status: Never Used  Substance and Sexual Activity   Alcohol use: No   Drug use: No   Sexual activity: Yes  Other Topics Concern   Not on file  Social History Narrative   Not on file   Social Drivers of Health   Financial Resource Strain: Not on file  Food Insecurity: Not on file  Transportation Needs: Not on file  Physical Activity: Not on file  Stress: Not on file   Social Connections: Not on file  Intimate Partner Violence: Not on file    Outpatient Medications Prior to Visit  Medication Sig Dispense Refill   aspirin EC 81 MG tablet Take 81 mg by mouth daily. Swallow whole.     atorvastatin (LIPITOR) 10 MG tablet TAKE ONE-HALF TABLET BY MOUTH DAILY 45 tablet 2   Coenzyme Q10 300 MG CAPS Take 300 mg by mouth every evening.     levothyroxine (SYNTHROID) 75 MCG tablet Take 75 mcg by mouth daily.     polyethylene glycol (MIRALAX / GLYCOLAX) 17 g packet Take 17 g by mouth 2 (two) times daily.  saccharomyces boulardii (FLORASTOR) 250 MG capsule Take 1 capsule (250 mg total) by mouth 2 (two) times daily. 60 capsule 0   acetaminophen (TYLENOL) 500 MG tablet Take 1,000 mg by mouth as needed.     Alirocumab (PRALUENT) 75 MG/ML SOAJ Inject 1 mL (75 mg total) into the skin every 14 (fourteen) days. 6 mL 3   amoxicillin-clavulanate (AUGMENTIN) 875-125 MG tablet Take 1 tablet by mouth 2 (two) times daily. (Patient not taking: Reported on 04/30/2023) 14 tablet 0   tacrolimus (PROGRAF) 1 MG capsule as needed (gum irritation). (Patient not taking: Reported on 04/30/2023)     No facility-administered medications prior to visit.    Allergies  Allergen Reactions   Ciprofloxacin Other (See Comments)    Abdominal pain-06/17/22-patient states she is not allergic, no rash or swelling    Crestor [Rosuvastatin] Other (See Comments)    Joint pain w/fibromyalgia   Praluent [Alirocumab] Other (See Comments)    Bruising and pain at injection site   Pravachol [Pravastatin] Other (See Comments)    Joint pain w/fibromyalgia   Cefazolin Rash   Rifampin Rash    ROS     Objective:    Physical Exam  BP 108/76 (BP Location: Right Arm, Patient Position: Sitting)   Pulse 72   Temp 97.8 F (36.6 C) (Oral)   Resp 18   Ht 5\' 4"  (1.626 m)   Wt 136 lb (61.7 kg)   SpO2 95%   BMI 23.34 kg/m  Wt Readings from Last 3 Encounters:  06/02/23 136 lb (61.7 kg)  01/27/23 138  lb (62.6 kg)  01/10/23 138 lb (62.6 kg)    Diabetic Foot Exam - Simple   No data filed    Lab Results  Component Value Date   WBC 4.0 06/02/2023   HGB 13.4 06/02/2023   HCT 39.2 06/02/2023   PLT 257.0 06/02/2023   GLUCOSE 91 06/02/2023   CHOL 150 06/02/2023   TRIG 49.0 06/02/2023   HDL 63.40 06/02/2023   LDLCALC 77 06/02/2023   ALT 15 06/02/2023   AST 25 06/02/2023   NA 134 (L) 06/02/2023   K 4.5 06/02/2023   CL 96 06/02/2023   CREATININE 0.63 06/02/2023   BUN 13 06/02/2023   CO2 31 06/02/2023   TSH 3.82 06/02/2023   HGBA1C 5.5 06/02/2023    Lab Results  Component Value Date   TSH 3.82 06/02/2023   Lab Results  Component Value Date   WBC 4.0 06/02/2023   HGB 13.4 06/02/2023   HCT 39.2 06/02/2023   MCV 87.5 06/02/2023   PLT 257.0 06/02/2023   Lab Results  Component Value Date   NA 134 (L) 06/02/2023   K 4.5 06/02/2023   CO2 31 06/02/2023   GLUCOSE 91 06/02/2023   BUN 13 06/02/2023   CREATININE 0.63 06/02/2023   BILITOT 1.1 06/02/2023   ALKPHOS 62 06/02/2023   AST 25 06/02/2023   ALT 15 06/02/2023   PROT 6.8 06/02/2023   ALBUMIN 4.4 06/02/2023   CALCIUM 9.3 06/02/2023   ANIONGAP 10 11/27/2020   GFR 88.57 06/02/2023   Lab Results  Component Value Date   CHOL 150 06/02/2023   Lab Results  Component Value Date   HDL 63.40 06/02/2023   Lab Results  Component Value Date   LDLCALC 77 06/02/2023   Lab Results  Component Value Date   TRIG 49.0 06/02/2023   Lab Results  Component Value Date   CHOLHDL 2 06/02/2023   Lab Results  Component Value  Date   HGBA1C 5.5 06/02/2023       Assessment & Plan:  Hyperglycemia Assessment & Plan: hgba1c acceptable, minimize simple carbs. Increase exercise as tolerated.    Orders: -     Comprehensive metabolic panel -     Hemoglobin A1c  Hyperlipidemia, mixed Assessment & Plan: Tolerating statin, encouraged heart healthy diet, avoid trans fats, minimize simple carbs and saturated fats. Increase  exercise as tolerated. Did not tolerate Zetia with mental status changes so stopped and she has not started the Praluent yet.  Orders: -     Lipid panel -     Lipoprotein A (LPA)  Constipation, unspecified constipation type Assessment & Plan: Encouraged increased hydration and fiber in diet. Daily probiotics. If bowels not moving can use MOM 2 tbls po in 4 oz of warm prune juice by mouth every 2-3 days. If no results then repeat in 4 hours with  Dulcolax suppository pr, may repeat again in 4 more hours as needed. Seek care if symptoms worsen. Using Miralax     Hypothyroidism, unspecified type Assessment & Plan: On Levothyroxine, continue to monitor  Orders: -     TSH -     T4, free  Paroxysmal A-fib (HCC) -     CBC with Differential/Platelet  Elevated C-reactive protein (CRP)  Aortic atherosclerosis (HCC) Assessment & Plan: Is being followed by cardiology.      Assessment and Plan Assessment & Plan Hypoglycemia Reports of morning nausea and lightheadedness, particularly when meals are spaced out. Symptoms improve with eating. Discussed the importance of regular protein intake and the role of good carbohydrates. -Encouraged regular protein intake every 3-4 hours. -Consider adding flax seeds and chia seeds to diet. -Check blood work today to assess glucose levels.  Hyperlipidemia History of intolerance to higher doses of Atorvastatin due to muscle and tendon cramps. Recent discontinuation of injectable cholesterol medication due to severe skin reaction. -Continue Atorvastatin half a pill daily. -Consider increasing to a full pill once a week (on Saturdays) to assess tolerance. -Check lipid panel and Lipoprotein (a) today.  Diverticulitis No current symptoms. History of recent flare managed with antibiotics. -Continue current management plan. -Consider re-referral to Urologist if symptoms recur.  General Health Maintenance -Encouraged to consider Shingles vaccine due  to associated reduction in dementia risk. -Consider Annual Wellness Visit for comprehensive health review. -Schedule follow-up appointment in 6 months, with potential for interim blood work at 3 months. -Encouraged to maintain bowel regularity, particularly in anticipation of travel.     Danise Edge, MD

## 2023-06-08 LAB — LIPOPROTEIN A (LPA): Lipoprotein (a): 155 nmol/L — ABNORMAL HIGH (ref <75)

## 2023-06-10 DIAGNOSIS — B958 Unspecified staphylococcus as the cause of diseases classified elsewhere: Secondary | ICD-10-CM | POA: Diagnosis not present

## 2023-06-10 DIAGNOSIS — L739 Follicular disorder, unspecified: Secondary | ICD-10-CM | POA: Diagnosis not present

## 2023-06-17 DIAGNOSIS — H353131 Nonexudative age-related macular degeneration, bilateral, early dry stage: Secondary | ICD-10-CM | POA: Diagnosis not present

## 2023-06-17 DIAGNOSIS — H2513 Age-related nuclear cataract, bilateral: Secondary | ICD-10-CM | POA: Diagnosis not present

## 2023-06-17 DIAGNOSIS — H18413 Arcus senilis, bilateral: Secondary | ICD-10-CM | POA: Diagnosis not present

## 2023-06-17 DIAGNOSIS — H25043 Posterior subcapsular polar age-related cataract, bilateral: Secondary | ICD-10-CM | POA: Diagnosis not present

## 2023-06-17 DIAGNOSIS — H2511 Age-related nuclear cataract, right eye: Secondary | ICD-10-CM | POA: Diagnosis not present

## 2023-07-01 DIAGNOSIS — L720 Epidermal cyst: Secondary | ICD-10-CM | POA: Diagnosis not present

## 2023-07-02 ENCOUNTER — Telehealth: Admitting: Medical

## 2023-07-02 DIAGNOSIS — L989 Disorder of the skin and subcutaneous tissue, unspecified: Secondary | ICD-10-CM | POA: Diagnosis not present

## 2023-07-02 MED ORDER — DOXYCYCLINE HYCLATE 100 MG PO TABS: 100.0000 mg | ORAL_TABLET | Freq: Two times a day (BID) | ORAL | 0 refills | Status: DC

## 2023-07-02 NOTE — Patient Instructions (Addendum)
 Chin cyst with possible infection Chin cyst with yellow discharge and redness suggests possible infection.  Dermatologist plans core excision on April 10th. Possible sebaceous cyst vs skin infection.(possible early small abscess) - Prescribed doxycycline 100 mg twice daily for 10 days. - Advised warm salt water compresses for 10 minutes twice daily. - Instructed to avoid pressing on the cyst. - Monitor for discharge and report if occurs as I could scheudule her  for potential wound culture.(not able to assess area in person today since video visit) - Follow up with dermatologist on April 10th for excision or sooner if needed - Update primary care provider via MyChart in 10 days regarding improvement.

## 2023-07-02 NOTE — Progress Notes (Signed)
 Virtual Visit via Video Note  I connected with Crystal Russell on 07/02/23 at  2:40 PM EDT by a video enabled telemedicine application and verified that I am speaking with the correct person using two identifiers.  Location: Patient: home [place] Provider: office [place]   I discussed the limitations of evaluation and management by telemedicine and the availability of in person appointments. The patient expressed understanding and agreed to proceed.  History of Present Illness: Discussed the use of AI scribe software for clinical note transcription with the patient, who gave verbal consent to proceed.  Vitals not provided by pt.  History of Present Illness         Discussed the use of AI scribe software for clinical note transcription with the patient, who gave verbal consent to proceed.  History of Present Illness   Crystal Russell is a 73 year old female who presents with a persistent chin cyst.  She has a persistent cyst on her chin that developed after she pulled a hair from the area before March 4th. Initially, she saw a dermatologist on March 4th, who administered a cortisone injection and prescribed a week's worth of doxycycline. Although the cyst improved, it did not resolve completely.  On March 25th, during a follow-up appointment, a stronger cortisone injection was administered. The cyst has refilled with discharge, described as 'creamy yellow,' and remains slightly red but not painful or hot. Dermatologist did not give additional antibiotic.  She is concerned about the lack of drainage and the possibility of infection, especially given her history of a knee replacement infection in 2022, which required prolonged antibiotic treatment, including doxycycline and a PICC line. She completed a course of doxycycline approximately two to three weeks ago, taking it for about a week to a week and a half, though she occasionally missed morning doses. She is currently not on antibiotics.   No fever, no  chills or sweats reported.                Observations/Objective: General-no acute distress, pleasant, oriented. Lungs- on inspection lungs appear unlabored. Neck- no tracheal deviation or jvd on inspection. Neuro- gross motor function appears intact. Skin- shadow cast on chin limiting view on rt side. Then light used that was too bright. Pt showed me picture 2 weeks ago of chin which shows small moderate ara mild raised. She states looks basically exact same.  Assessment and Plan: Assessment and Plan          Patient Instructions  Chin cyst with possible infection Chin cyst with yellow discharge and redness suggests possible infection.  Dermatologist plans core excision on April 10th. Possible sebaceous cyst vs skin infection.(possible early small abscess) - Prescribed doxycycline 100 mg twice daily for 10 days. - Advised warm salt water compresses for 10 minutes twice daily. - Instructed to avoid pressing on the cyst. - Monitor for discharge and report if occurs as I could scheudule her  for potential wound culture.(not able to assess area in person today since video visit) - Follow up with dermatologist on April 10th for excision or sooner if needed - Update primary care provider via MyChart in 10 days regarding improvement.    Esperanza Richters, PA-C       Follow Up Instructions:    I discussed the assessment and treatment plan with the patient. The patient was provided an opportunity to ask questions and all were answered. The patient agreed with the plan and demonstrated an understanding of the instructions.  The patient was advised to call back or seek an in-person evaluation if the symptoms worsen or if the condition fails to improve as anticipated.   Esperanza Richters, PA-C

## 2023-07-29 ENCOUNTER — Encounter: Payer: Self-pay | Admitting: Medical

## 2023-07-30 ENCOUNTER — Encounter: Payer: Self-pay | Admitting: Physician Assistant

## 2023-07-30 ENCOUNTER — Ambulatory Visit (INDEPENDENT_AMBULATORY_CARE_PROVIDER_SITE_OTHER): Admitting: Physician Assistant

## 2023-07-30 VITALS — BP 121/68 | HR 58 | Ht 64.0 in

## 2023-07-30 DIAGNOSIS — L723 Sebaceous cyst: Secondary | ICD-10-CM

## 2023-07-30 NOTE — Progress Notes (Signed)
      Established patient visit   Patient: Crystal Russell   DOB: Dec 05, 1950   73 y.o. Female  MRN: [id number] Visit Date: 07/30/2023  Today's healthcare provider: Trenton Frock, PA-C   Cc. cyst  Subjective     Pt has been struggling with a recurrent sebaceous cyst/ingrown hair on her chin that often gets inflamed/drains. She presents for a second opinion after seeing a dermatologist.   Medications: Outpatient Medications Prior to Visit  Medication Sig   aspirin  EC 81 MG tablet Take 81 mg by mouth daily. Swallow whole.   atorvastatin  (LIPITOR) 10 MG tablet TAKE ONE-HALF TABLET BY MOUTH DAILY   Coenzyme Q10 300 MG CAPS Take 300 mg by mouth every evening.   levothyroxine  (SYNTHROID ) 75 MCG tablet Take 75 mcg by mouth daily.   polyethylene glycol (MIRALAX  / GLYCOLAX ) 17 g packet Take 17 g by mouth 2 (two) times daily.   saccharomyces boulardii (FLORASTOR) 250 MG capsule Take 1 capsule (250 mg total) by mouth 2 (two) times daily.   [DISCONTINUED] doxycycline  (VIBRA -TABS) 100 MG tablet Take 1 tablet (100 mg total) by mouth 2 (two) times daily.   No facility-administered medications prior to visit.    Review of Systems  Constitutional:  Negative for fatigue and fever.  Respiratory:  Negative for cough and shortness of breath.   Cardiovascular:  Negative for chest pain and leg swelling.  Gastrointestinal:  Negative for abdominal pain.  Neurological:  Negative for dizziness and headaches.       Objective    BP 121/68   Pulse (!) 58   Ht 5\' 4"  (1.626 m)   BMI 23.34 kg/m    Physical Exam Vitals reviewed.  Constitutional:      Appearance: She is not ill-appearing.  HENT:     Head: Normocephalic.  Eyes:     Conjunctiva/sclera: Conjunctivae normal.  Cardiovascular:     Rate and Rhythm: Normal rate.  Pulmonary:     Effort: Pulmonary effort is normal. No respiratory distress.  Skin:    Comments: On pt's lower right chin there is a 2 mm firm mass in the skin w/ a scab. No  drainage, no fluctuance or surrounding erythema  Neurological:     Mental Status: She is alert and oriented to person, place, and time.  Psychiatric:        Mood and Affect: Mood normal.        Behavior: Behavior normal.     No results found for any visits on 07/30/23.  Assessment & Plan    Sebaceous cyst   Recommending serial warm compresses. No need for abx tx today.  Ultimately the only way to remove is excision -- pt has an appt with a new derm in a few weeks.  Return if symptoms worsen or fail to improve.       Trenton Frock, PA-C  [facility] [phone] (phone) [phone] (fax)  [facility]

## 2023-08-14 ENCOUNTER — Encounter: Payer: Self-pay | Admitting: Medical

## 2023-08-19 DIAGNOSIS — L728 Other follicular cysts of the skin and subcutaneous tissue: Secondary | ICD-10-CM | POA: Diagnosis not present

## 2023-10-29 ENCOUNTER — Ambulatory Visit: Payer: Medicare Other | Admitting: Internal Medicine

## 2023-11-03 ENCOUNTER — Other Ambulatory Visit: Payer: Self-pay | Admitting: Family Medicine

## 2023-11-03 DIAGNOSIS — Z1231 Encounter for screening mammogram for malignant neoplasm of breast: Secondary | ICD-10-CM

## 2023-11-05 ENCOUNTER — Other Ambulatory Visit: Payer: Self-pay

## 2023-11-05 ENCOUNTER — Encounter: Payer: Self-pay | Admitting: Internal Medicine

## 2023-11-05 ENCOUNTER — Ambulatory Visit
Admission: RE | Admit: 2023-11-05 | Discharge: 2023-11-05 | Disposition: A | Discharge status: Home / Self Care | Source: Ambulatory Visit

## 2023-11-05 ENCOUNTER — Ambulatory Visit: Admitting: Internal Medicine

## 2023-11-05 VITALS — BP 103/67 | HR 51 | Temp 98.1°F | Ht 64.5 in | Wt 130.0 lb

## 2023-11-05 DIAGNOSIS — R5383 Other fatigue: Secondary | ICD-10-CM

## 2023-11-05 DIAGNOSIS — B9689 Other specified bacterial agents as the cause of diseases classified elsewhere: Secondary | ICD-10-CM | POA: Diagnosis not present

## 2023-11-05 DIAGNOSIS — T8450XS Infection and inflammatory reaction due to unspecified internal joint prosthesis, sequela: Secondary | ICD-10-CM

## 2023-11-05 DIAGNOSIS — L03115 Cellulitis of right lower limb: Secondary | ICD-10-CM | POA: Diagnosis not present

## 2023-11-05 DIAGNOSIS — T8453XD Infection and inflammatory reaction due to internal right knee prosthesis, subsequent encounter: Secondary | ICD-10-CM | POA: Diagnosis not present

## 2023-11-05 DIAGNOSIS — Z87828 Personal history of other (healed) physical injury and trauma: Secondary | ICD-10-CM

## 2023-11-05 DIAGNOSIS — Z1231 Encounter for screening mammogram for malignant neoplasm of breast: Secondary | ICD-10-CM

## 2023-11-05 NOTE — Progress Notes (Signed)
 Patient ID: Crystal Russell, female   DOB: 12/18/1950, 73 y.o.   MRN: [id number]  HPI Crystal Russell is a 73 yo F with hx of remote right knee pji. She reports that  4 wks ago had insect/tick bite in right leg. Redness, not bull's eye. No abtx at that time. Fatigue since then. No fevers, chills, not joint pain.   She reports less activity overall; feels  Deconditioned   Outpatient Encounter Medications as of 11/05/2023  Medication Sig   aspirin  EC 81 MG tablet Take 81 mg by mouth daily. Swallow whole.   atorvastatin  (LIPITOR) 10 MG tablet TAKE ONE-HALF TABLET BY MOUTH DAILY   Coenzyme Q10 300 MG CAPS Take 300 mg by mouth every evening.   levothyroxine  (SYNTHROID ) 75 MCG tablet Take 75 mcg by mouth daily.   polyethylene glycol (MIRALAX  / GLYCOLAX ) 17 g packet Take 17 g by mouth 2 (two) times daily.   saccharomyces boulardii (FLORASTOR) 250 MG capsule Take 1 capsule (250 mg total) by mouth 2 (two) times daily.   No facility-administered encounter medications on file as of 11/05/2023.     Patient Active Problem List   Diagnosis Date Noted   Dry mouth 10/11/2021   Hyponatremia 10/11/2021   PSVT (paroxysmal supraventricular tachycardia) (HCC) 05/08/2021   Aortic atherosclerosis (HCC) 05/08/2021   Infection of prosthetic right knee joint (HCC) 09/14/2020   PAC (premature atrial contraction) 07/28/2020   Nonrheumatic aortic valve insufficiency 05/01/2020   Mild mitral regurgitation 05/01/2020   PFO (patent foramen ovale) 05/01/2020   Diverticulitis 03/16/2020   Hyperglycemia 03/16/2020   Vertigo 09/14/2019   Postmenopausal bleeding 12/06/2015   Varicose veins of bilateral lower extremities with other complications 09/11/2015   Sun-damaged skin 12/04/2014   Breast mass, left 05/16/2014   Varicose veins of bilateral lower extremities with other complications 12/08/2013   Preventative health care 08/08/2013   Hypothyroidism 08/08/2013   Atrophic vaginitis 08/08/2013   Cervical cancer  screening 08/05/2013   ETD (eustachian tube dysfunction) 05/26/2013   Depression with anxiety 12/27/2012   Ventral hernia 08/05/2012   Varicose veins 08/05/2012   DJD (degenerative joint disease) 08/05/2012   Constipation 08/05/2012   Hyperlipidemia, mixed 08/05/2012   H/O gestational diabetes mellitus, not currently pregnant 08/05/2012   Chicken pox    Shingles    Paroxysmal A-fib (HCC)    Spondylolisthesis      Health Maintenance Due  Topic Date Due   Medicare Annual Wellness (AWV)  Never done     Review of Systems Family stressor.  Physical Exam   BP 103/67   Pulse (!) 51   Temp 98.1 F (36.7 C) (Oral)   Ht 5' 4.5 (1.638 m)   Wt 130 lb (59 kg)   SpO2 99%   BMI 21.97 kg/m    Physical Exam  Constitutional:  oriented to person, place, and time. appears well-developed and well-nourished. No distress.  HENT: [place]/AT, PERRLA, no scleral icterus Mouth/Throat: Oropharynx is clear and moist. No oropharyngeal exudate.  Cardiovascular: Normal rate, regular rhythm and normal heart sounds. Exam reveals no gallop and no friction rub.  No murmur heard.  Pulmonary/Chest: Effort normal and breath sounds normal. No respiratory distress.  has no wheezes.  Neck = supple, no nuchal rigidity Abdominal: Soft. Bowel sounds are normal.  exhibits no distension. There is no tenderness.  Lymphadenopathy: no cervical adenopathy. No axillary adenopathy Neurological: alert and oriented to person, place, and time.  Skin: Skin is warm and dry. No rash noted. No erythema. +trace  edema Psychiatric: a normal mood and affect.  behavior is normal.    CBC Lab Results  Component Value Date   WBC 4.0 06/02/2023   RBC 4.48 06/02/2023   HGB 13.4 06/02/2023   HCT 39.2 06/02/2023   PLT 257.0 06/02/2023   MCV 87.5 06/02/2023   MCH 29.3 10/14/2022   MCHC 34.3 06/02/2023   RDW 12.6 06/02/2023   LYMPHSABS 1.3 06/02/2023   MONOABS 0.4 06/02/2023   EOSABS 0.1 06/02/2023    BMET Lab Results   Component Value Date   NA 134 (L) 06/02/2023   K 4.5 06/02/2023   CL 96 06/02/2023   CO2 31 06/02/2023   GLUCOSE 91 06/02/2023   BUN 13 06/02/2023   CREATININE 0.63 06/02/2023   CALCIUM  9.3 06/02/2023   GFRNONAA >60 11/27/2020   GFRAA >60 09/19/2019    Lab Results  Component Value Date   ESRSEDRATE 6 11/05/2023   Lab Results  Component Value Date   CRP <3.0 11/05/2023     Assessment and Plan  Chronic pji = still remains off of abtx. Plan to check inflammatory markers  History of insect bite/possible tick bite = will check lyme titer   Fatigue= could be consistent with deconditioning rather than infectious process  Continue to monitor of abtx

## 2023-11-06 LAB — BASIC METABOLIC PANEL WITH GFR
BUN/Creatinine Ratio: 28 (calc) — ABNORMAL HIGH (ref 6–22)
BUN: 16 mg/dL (ref 7–25)
CO2: 30 mmol/L (ref 20–32)
Calcium: 9.5 mg/dL (ref 8.6–10.4)
Chloride: 97 mmol/L — ABNORMAL LOW (ref 98–110)
Creat: 0.58 mg/dL — ABNORMAL LOW (ref 0.60–1.00)
Glucose, Bld: 68 mg/dL (ref 65–99)
Potassium: 4.4 mmol/L (ref 3.5–5.3)
Sodium: 133 mmol/L — ABNORMAL LOW (ref 135–146)
eGFR: 96 mL/min/1.73m2 (ref >=60)

## 2023-11-06 LAB — B. BURGDORFI ANTIBODIES: B burgdorferi Ab IgG+IgM: <0.9 {index}

## 2023-11-06 LAB — SEDIMENTATION RATE: Sed Rate: 6 mm/h (ref 0–30)

## 2023-11-06 LAB — C-REACTIVE PROTEIN: CRP: <3 mg/L (ref <8.0)

## 2023-11-17 ENCOUNTER — Other Ambulatory Visit: Payer: Self-pay | Admitting: Family Medicine

## 2023-11-17 ENCOUNTER — Encounter: Payer: Self-pay | Admitting: Family Medicine

## 2023-11-17 DIAGNOSIS — I83893 Varicose veins of bilateral lower extremities with other complications: Secondary | ICD-10-CM

## 2023-11-19 ENCOUNTER — Ambulatory Visit: Admitting: Podiatry

## 2023-11-19 ENCOUNTER — Ambulatory Visit (INDEPENDENT_AMBULATORY_CARE_PROVIDER_SITE_OTHER)

## 2023-11-19 ENCOUNTER — Encounter: Payer: Self-pay | Admitting: Podiatry

## 2023-11-19 DIAGNOSIS — M778 Other enthesopathies, not elsewhere classified: Secondary | ICD-10-CM

## 2023-11-19 DIAGNOSIS — Q828 Other specified congenital malformations of skin: Secondary | ICD-10-CM

## 2023-11-19 DIAGNOSIS — M7751 Other enthesopathy of right foot: Secondary | ICD-10-CM

## 2023-11-19 MED ORDER — TRIAMCINOLONE ACETONIDE 10 MG/ML IJ SUSP
10.0000 mg | Freq: Once | INTRAMUSCULAR | Status: AC
  Administered 2023-11-19 (×2): 10 mg via INTRA_ARTICULAR

## 2023-11-19 NOTE — Progress Notes (Signed)
 Subjective:   Patient ID: Crystal Russell, female   DOB: 73 y.o.   MRN: [id number]   HPI Patient presents with a lot of pain in the outside of the right foot and states it feels like she is walking on an inflamed area and this has been going on for several months and worse with certain type of shoes with very cushioned shoes better than others.  Patient does not smoke likes to be active   Review of Systems  All other systems reviewed and are negative.       Objective:  Physical Exam Vitals and nursing note reviewed.  Constitutional:      Appearance: She is well-developed.  Pulmonary:     Effort: Pulmonary effort is normal.  Musculoskeletal:        General: Normal range of motion.  Skin:    General: Skin is warm.  Neurological:     Mental Status: She is alert.     Neurovascular status intact muscle strength found to be adequate range of motion within normal limits with patient noted to have inflammation of the right fifth MPJ with fluid buildup around the joint surface and pain when I try to walk on the foot.  Patient has good digital perfusion well oriented x 3 with lucent cord lesion also in the plantar aspect fifth metatarsal     Assessment:  Inflammatory capsulitis fifth MPJ right with porokeratotic type lesion     Plan:  H&P x-rays reviewed condition discussed.  At this point sterile prep injected the capsule of the fifth MPJ 3 mg dexamethasone  Kenalog  5 mg Xylocaine  debrided lesion no iatrogenic bleeding reappoint for routine care and may require head resection if symptoms persist or do not improve  X-rays indicate there is some enlargement of the head right fifth metatarsal but no indication fracture no indication of arthritis

## 2023-11-20 ENCOUNTER — Telehealth: Payer: Self-pay | Admitting: Family Medicine

## 2023-11-20 ENCOUNTER — Other Ambulatory Visit (INDEPENDENT_AMBULATORY_CARE_PROVIDER_SITE_OTHER)

## 2023-11-20 ENCOUNTER — Encounter: Payer: Self-pay | Admitting: Internal Medicine

## 2023-11-20 DIAGNOSIS — E782 Mixed hyperlipidemia: Secondary | ICD-10-CM | POA: Diagnosis not present

## 2023-11-20 NOTE — Telephone Encounter (Signed)
 There was a mishap with her blood work drawn on 8/14 I called her to get a reschedule she said she was fine with getting the blood work done on her next appointment on 8/28. Orders are back in the computer as well.

## 2023-11-20 NOTE — Addendum Note (Signed)
 Addended by: TRUDY CURVIN RAMAN on: 11/20/2023 05:01 PM   Modules accepted: Orders

## 2023-11-24 DIAGNOSIS — H5201 Hypermetropia, right eye: Secondary | ICD-10-CM | POA: Diagnosis not present

## 2023-11-24 DIAGNOSIS — H2512 Age-related nuclear cataract, left eye: Secondary | ICD-10-CM | POA: Diagnosis not present

## 2023-11-24 DIAGNOSIS — H2511 Age-related nuclear cataract, right eye: Secondary | ICD-10-CM | POA: Diagnosis not present

## 2023-11-24 DIAGNOSIS — H43812 Vitreous degeneration, left eye: Secondary | ICD-10-CM | POA: Diagnosis not present

## 2023-11-24 DIAGNOSIS — H353132 Nonexudative age-related macular degeneration, bilateral, intermediate dry stage: Secondary | ICD-10-CM | POA: Diagnosis not present

## 2023-11-24 DIAGNOSIS — H20041 Secondary noninfectious iridocyclitis, right eye: Secondary | ICD-10-CM | POA: Diagnosis not present

## 2023-11-24 DIAGNOSIS — H52221 Regular astigmatism, right eye: Secondary | ICD-10-CM | POA: Diagnosis not present

## 2023-11-24 DIAGNOSIS — G453 Amaurosis fugax: Secondary | ICD-10-CM | POA: Diagnosis not present

## 2023-11-25 ENCOUNTER — Other Ambulatory Visit (INDEPENDENT_AMBULATORY_CARE_PROVIDER_SITE_OTHER)

## 2023-11-25 ENCOUNTER — Ambulatory Visit: Payer: Self-pay | Admitting: Family Medicine

## 2023-11-25 ENCOUNTER — Ambulatory Visit: Payer: Self-pay

## 2023-11-25 DIAGNOSIS — E782 Mixed hyperlipidemia: Secondary | ICD-10-CM

## 2023-11-25 LAB — LIPID PANEL
Cholesterol: 169 mg/dL (ref 0–200)
HDL: 67.1 mg/dL (ref >39.00)
LDL Cholesterol: 93 mg/dL (ref 0–99)
NonHDL: 101.76
Total CHOL/HDL Ratio: 3
Triglycerides: 46 mg/dL (ref 0.0–149.0)
VLDL: 9.2 mg/dL (ref 0.0–40.0)

## 2023-11-25 LAB — COMPREHENSIVE METABOLIC PANEL WITH GFR
ALT: 18 U/L (ref 0–35)
AST: 28 U/L (ref 0–37)
Albumin: 4.2 g/dL (ref 3.5–5.2)
Alkaline Phosphatase: 62 U/L (ref 39–117)
BUN: 18 mg/dL (ref 6–23)
CO2: 31 meq/L (ref 19–32)
Calcium: 9.2 mg/dL (ref 8.4–10.5)
Chloride: 96 meq/L (ref 96–112)
Creatinine, Ser: 0.65 mg/dL (ref 0.40–1.20)
GFR: 87.61 mL/min (ref >60.00)
Glucose, Bld: 90 mg/dL (ref 70–99)
Potassium: 4.3 meq/L (ref 3.5–5.1)
Sodium: 134 meq/L — ABNORMAL LOW (ref 135–145)
Total Bilirubin: 0.9 mg/dL (ref 0.2–1.2)
Total Protein: 6.7 g/dL (ref 6.0–8.3)

## 2023-11-25 NOTE — Telephone Encounter (Signed)
 Appt scheduled

## 2023-11-25 NOTE — Telephone Encounter (Signed)
 FYI Only or Action Required?: Action required by provider: update on patient condition.  Patient was last seen in primary care on 07/02/2023 by Dorina Loving, PA-C.  Called Nurse Triage reporting Eye Problem.  Symptoms began several days ago.  Interventions attempted: Nothing.  Symptoms are: unchanged.  Triage Disposition: See PCP When Office is Open (Within 3 Days)  Patient/caregiver understands and will follow disposition?: Yes    Copied from CRM [id number]. Topic: Clinical - Red Word Triage >> Nov 25, 2023 11:18 AM Crystal Russell wrote: Red Word that prompted transfer to Nurse Triage: Patient called in requesting to speak with nurse due to Vision disturbance at night, can't see the center black and red can only see outside of circle. Dr. evetta think it's an eye issue think it's BP dropping, not enough oxygen to brain etc. Reason for Disposition  [1] Brief (now gone) blurred vision AND [2] unexplained  Answer Assessment - Initial Assessment Questions 1. DESCRIPTION: How has your vision changed? (e.g., complete vision loss, blurred vision, double vision, floaters, etc.)     If you draw a circle around your field of vision, in the center, I see only red and black - explains that if her field of vision was a plate and there was a cookie in the middle, the cookie appears red and black, but she can see through the cookie to the plate No visual issues during the day Patient does have a history of ocular migraines every three months or so, but this is not the same. The MD that did the cataract surgery does not think this is an eye issue because both eyes are involved 2. LOCATION: One or both eyes? If one, ask: Which eye?     Both eyes 3. SEVERITY: Can you see anything? If Yes, ask: What can you see? (e.g., fine print)     See above (#1) 4. ONSET: When did this begin? Did it start suddenly or has this been gradual?     Occurred three times, the day before surgery, the day of  surgery and today when patient wakes up in the middle of the night to go to the bathroom 5. PATTERN: Does this come and go, or has it been constant since it started?     Only occurs when she gets up to toilet at night, vision restored in the morning 6. PAIN: Is there any pain in your eye(s)?  (Scale 1-10; or mild, moderate, severe)     denies 7. CONTACTS-GLASSES: Do you wear contacts or glasses?     Cataract surgery on 11/24/2023, visual disturbances occurred before surgery and after surgery 8. CAUSE: What do you think is causing this visual problem?     unsure 9. OTHER SYMPTOMS: Do you have any other symptoms? (e.g., confusion, headache, arm or leg weakness, speech problems)     Floaters x one year.  Possible balance issues about a month ago.  Denies dizziness, weakness, or speech difficulty 10. PREGNANCY: Is there any chance you are pregnant? When was your last menstrual period?       N/A  Protocols used: Vision Loss or Change-A-AH

## 2023-11-26 ENCOUNTER — Telehealth: Payer: Self-pay | Admitting: Family Medicine

## 2023-11-26 ENCOUNTER — Encounter: Payer: Self-pay | Admitting: Family Medicine

## 2023-11-26 ENCOUNTER — Ambulatory Visit (INDEPENDENT_AMBULATORY_CARE_PROVIDER_SITE_OTHER): Admitting: Family Medicine

## 2023-11-26 VITALS — BP 132/80 | HR 50 | Ht 64.5 in | Wt 130.0 lb

## 2023-11-26 DIAGNOSIS — E782 Mixed hyperlipidemia: Secondary | ICD-10-CM

## 2023-11-26 DIAGNOSIS — H539 Unspecified visual disturbance: Secondary | ICD-10-CM

## 2023-11-26 DIAGNOSIS — Z5181 Encounter for therapeutic drug level monitoring: Secondary | ICD-10-CM

## 2023-11-26 DIAGNOSIS — E039 Hypothyroidism, unspecified: Secondary | ICD-10-CM | POA: Diagnosis not present

## 2023-11-26 NOTE — Patient Instructions (Addendum)
 It was good to see you today I think this issue has to do with your eyes as opposed to a brain or BP issues  However please do let us  know if the eye doctor thinks this is indeed a medical issue and we can look further

## 2023-11-26 NOTE — Progress Notes (Signed)
 [facility] [address], [address] [place], [place] [address] [phone]  Date:  11/26/2023   Name:  Crystal Russell   DOB:  01-01-1951   MRN:  [id number]  PCP:  Domenica Harlene LABOR, MD    Chief Complaint: Decreased Visual Acuity (Onset 2 weeks, pt does have a history of ocular migraine /Pt states its happened 4 times, when I get up to go to the bathroom I see black in the center of my eye but its not a floater. Its not the same in both eyes but a shadow in the eye, it changed colors from black, grey and white pt states one she went to the living room and there was light she did not see it, and when going back into the dark room and pt could see the black again./Pt was told this could be from BP dropping at night )   History of Present Illness:  Crystal Russell is a 73 y.o. very pleasant female patient who presents with the following:  Patient seen today with concern of visual disturbance.  Primary patient of my partner Dr.Stacey Blyth-I have not seen her myself previously From previous notes it looks like she was going to have cataract surgery soon She has history of hypothyroidism, paroxysmal atrial fibrillation PFO/mitral regurg/aortic insufficiency artificial joint infection  Pt notes when she opens her eyes at night lately- on a few occasions- she noted a lacy visual effect in her vision.  This only occurs in the dark.  She also tried it out during the day in a dark closet and noted the same thing  Seemed to be in both eyes in the past This occurred last night but it seemed to be mostly  She noted a white light when she went back to bed and closed her eyes  The visual effect is not there when she is in a normal light environment   She did have cataract surgery -2 days ago- just the right eye so far, the left will be next month  She did see her eye surgeon yesterday but they were not sure what was causing this symptom She is seeing her  optometrist tomorrow for an eye exam She denies any other neuro symptoms such a numbness, weakness or slurred speech   Patient Active Problem List   Diagnosis Date Noted   Hyponatremia 10/11/2021   PSVT (paroxysmal supraventricular tachycardia) (HCC) 05/08/2021   Aortic atherosclerosis (HCC) 05/08/2021   Infection of prosthetic right knee joint (HCC) 09/14/2020   PAC (premature atrial contraction) 07/28/2020   Nonrheumatic aortic valve insufficiency 05/01/2020   Mild mitral regurgitation 05/01/2020   PFO (patent foramen ovale) 05/01/2020   Diverticulitis 03/16/2020   Hyperglycemia 03/16/2020   Vertigo 09/14/2019   Postmenopausal bleeding 12/06/2015   Varicose veins of bilateral lower extremities with other complications 09/11/2015   Sun-damaged skin 12/04/2014   Breast mass, left 05/16/2014   Hypothyroidism 08/08/2013   Atrophic vaginitis 08/08/2013   Depression with anxiety 12/27/2012   Ventral hernia 08/05/2012   DJD (degenerative joint disease) 08/05/2012   Constipation 08/05/2012   Hyperlipidemia, mixed 08/05/2012   H/O gestational diabetes mellitus, not currently pregnant 08/05/2012   Chicken pox    Shingles    Paroxysmal A-fib (HCC)    Spondylolisthesis     Past Medical History:  Diagnosis Date   Abdominal wall hernia 08/04/2012   Atrial fibrillation (HCC) 2008   REPORTS SHE WAS AT [place], UPON WAKING  I FELT MY HEART GOING BOOM BOOM BOOM BEATING OUT OF MY CHEST. I WENT TO HOSPITAL AND THEY ISED MEDICINE TO CONVERT ME , IT TOOK OVER 10 HOURS TO CONVERT ME. BUT AFTER THAT IVE NEVER HAD ANY ISSUES SINCE. I USED TO HAVE A CARDIOLOGUIST AS MY PRIMARY IN [place]  BUT IHAVENT BEEN TO A CARDIOLOGIST SINCE I MOVED DOWN HERE.    Chicken pox as a child   Depression    Depression with anxiety 12/27/2012   Diverticulitis    DJD (degenerative joint disease) 08/05/2012   DVT (deep venous thrombosis) (HCC) 2018   Fifth disease 1989   DX AFTER EXPOSURE FROM HER CHILDREN; DEVELOPED  RHEUMATOID ARTHRITIS DURING COURSE OF ILLNESS. REPORTS:   I DONT HAVE IT ANYMORE SINCE THE DISEASE IS GONE    H/O gestational diabetes mellitus, not currently pregnant 08/05/2012   History of    Heart murmur    echo 2012; I HAVE A FUNCTIONAL MURMUR    Hyperlipidemia    Hypothyroidism    Knee effusion, right    while on Eliquis, discontinued Eliquis after DVT resolved   Measles as a child   Migraines    Other and unspecified hyperlipidemia 08/05/2012   Paroxysmal A-fib (HCC)    Shingles 43   Spondylisthesis 2012   L5/S1 grade 3   Varicose veins 08/05/2012   Extending up to right groin B/l LE   Ventral hernia 08/05/2012   Wears glasses     Past Surgical History:  Procedure Laterality Date   BREAST DUCTAL SYSTEM EXCISION Right 05/05/2014   Procedure: RIGHT BREAST DUCT EXCISION;  Surgeon: Donnice Bury, MD;  Location: [facility];  Service: General;  Laterality: Right;   COLONOSCOPY     DILATION AND CURETTAGE OF UTERUS     x4   EXCISIONAL TOTAL KNEE ARTHROPLASTY WITH ANTIBIOTIC SPACERS Right 09/14/2020   Procedure: Irrigation and debriement with poly exchange versus resection of right total knee arthroplasty and placement of antibitiotic spacer;  Surgeon: Ernie Donnice, MD;  Location: [facility];  Service: Orthopedics;  Laterality: Right;   MOUTH SURGERY     TOTAL KNEE ARTHROPLASTY Right 07/01/2017   Procedure: RIGHT TOTAL KNEE ARTHROPLASTY;  Surgeon: Ernie Donnice, MD;  Location: [facility];  Service: Orthopedics;  Laterality: Right;  70 mins   VARICOSE VEIN SURGERY  1995   right leg    Social History   Tobacco Use   Smoking status: Former    Types: Cigarettes   Smokeless tobacco: Never  Vaping Use   Vaping status: Never Used  Substance Use Topics   Alcohol use: No   Drug use: No    Family History  Problem Relation Age of Onset   Cancer Mother        lung- smoker   Liver disease Mother    Cancer Father        lung- smoker   Peripheral Artery Disease Sister     Heart attack Paternal Uncle    Dementia Maternal Grandmother    Heart attack Maternal Grandfather    Asthma Son    Ulcerative colitis Son    Asthma Son    Ulcerative colitis Son    Pancreatic cancer Other    Stomach cancer Other    Colon cancer Neg Hx    Cystic fibrosis Neg Hx    Esophageal cancer Neg Hx     Allergies  Allergen Reactions   Ciprofloxacin  Other (See Comments)    Abdominal pain-06/17/22-patient states she is  not allergic, no rash or swelling    Crestor [Rosuvastatin] Other (See Comments)    Joint pain w/fibromyalgia   Praluent  [Alirocumab ] Other (See Comments)    Bruising and pain at injection site   Pravachol [Pravastatin] Other (See Comments)    Joint pain w/fibromyalgia   Cefazolin  Rash   Rifampin  Rash    Medication list has been reviewed and updated.  Current Outpatient Medications on File Prior to Visit  Medication Sig Dispense Refill   aspirin  EC 81 MG tablet Take 81 mg by mouth daily. Swallow whole.     atorvastatin  (LIPITOR) 10 MG tablet TAKE ONE-HALF TABLET BY MOUTH DAILY 45 tablet 2   Coenzyme Q10 300 MG CAPS Take 300 mg by mouth every evening.     levothyroxine  (SYNTHROID ) 75 MCG tablet Take 75 mcg by mouth daily.     polyethylene glycol (MIRALAX  / GLYCOLAX ) 17 g packet Take 17 g by mouth 2 (two) times daily.     No current facility-administered medications on file prior to visit.    Review of Systems: As per HPI- otherwise negative.  Pulse Readings from Last 3 Encounters:  11/26/23 (!) 50  11/05/23 (!) 51  07/30/23 (!) 58    Pulse 57 a year ago - in the 50s is normal for her   Physical Examination: Vitals:   11/26/23 1546  BP: 132/80  Pulse: (!) 50  SpO2: 97%   Vitals:   11/26/23 1546  Weight: 130 lb (59 kg)  Height: 5' 4.5 (1.638 m)   Body mass index is 21.97 kg/m. Ideal Body Weight: Weight in (lb) to have BMI = 25: 147.6  GEN: no acute distress.  Normal weight, looks well  HEENT: Atraumatic, Normocephalic.   Bilateral TM wnl, oropharynx normal.  PEERL,EOMI.   Recent cataract surgery right eye  Ears and Nose: No external deformity. CV: RRR, No M/G/R. No JVD. No thrill. No extra heart sounds. PULM: CTA B, no wheezes, crackles, rhonchi. No retractions. No resp. distress. No accessory muscle use. ABD: S, NT, ND EXTR: No c/c/e PSYCH: Normally interactive. Conversant.  Orthostatic testing normal  Normal strength, sensation and DTR of all limbs  Assessment and Plan: Hyperlipidemia, mixed  Hypothyroidism, unspecified type - Plan: TSH  Medication monitoring encounter - Plan: CBC  Vision changes  Pt notes she went to have labs drawn yesterday but they missed her TSH and CBC- I ordered these today  Otherwise she describes and unusual visual phenomenon which only occurs in a dark setting- when she is in the light her vision is normal. Advised her I think this is likely an eye issue - she is seeing optometry tomorrow I asked her to please let me know if the optometrist thinks this is a non- eye medical issue and I will be glad to lok further for her   Signed Harlene Schroeder, MD  8/21- received labs, message to pt Results for orders placed or performed in visit on 11/26/23  CBC   Collection Time: 11/26/23  4:31 PM  Result Value Ref Range   WBC 5.1 4.0 - 10.5 K/uL   RBC 4.26 3.87 - 5.11 Mil/uL   Platelets 267.0 150.0 - 400.0 K/uL   Hemoglobin 12.4 12.0 - 15.0 g/dL   HCT 62.9 63.9 - 53.9 %   MCV 86.8 78.0 - 100.0 fl   MCHC 33.6 30.0 - 36.0 g/dL   RDW 87.4 88.4 - 84.4 %  TSH   Collection Time: 11/26/23  4:31 PM  Result Value Ref  Range   TSH 5.12 0.35 - 5.50 uIU/mL

## 2023-11-26 NOTE — Telephone Encounter (Signed)
 Copied from CRM #[id number]. Topic: General - Phone/Fax/Address >> Nov 26, 2023 10:31 AM Viola FALCON wrote: Inocente from Opthalmology called to let office know they will be faxing notes over for patients appt today with Dr. Ubaldo

## 2023-11-27 ENCOUNTER — Encounter: Payer: Self-pay | Admitting: Family Medicine

## 2023-11-27 DIAGNOSIS — H52223 Regular astigmatism, bilateral: Secondary | ICD-10-CM | POA: Diagnosis not present

## 2023-11-27 DIAGNOSIS — H43812 Vitreous degeneration, left eye: Secondary | ICD-10-CM | POA: Diagnosis not present

## 2023-11-27 DIAGNOSIS — H2513 Age-related nuclear cataract, bilateral: Secondary | ICD-10-CM | POA: Diagnosis not present

## 2023-11-27 DIAGNOSIS — H353132 Nonexudative age-related macular degeneration, bilateral, intermediate dry stage: Secondary | ICD-10-CM | POA: Diagnosis not present

## 2023-11-27 DIAGNOSIS — H524 Presbyopia: Secondary | ICD-10-CM | POA: Diagnosis not present

## 2023-11-27 DIAGNOSIS — H5202 Hypermetropia, left eye: Secondary | ICD-10-CM | POA: Diagnosis not present

## 2023-11-27 LAB — TSH: TSH: 5.12 u[IU]/mL (ref 0.35–5.50)

## 2023-11-27 LAB — CBC
HCT: 37 % (ref 36.0–46.0)
Hemoglobin: 12.4 g/dL (ref 12.0–15.0)
MCHC: 33.6 g/dL (ref 30.0–36.0)
MCV: 86.8 fl (ref 78.0–100.0)
Platelets: 267 K/uL (ref 150.0–400.0)
RBC: 4.26 Mil/uL (ref 3.87–5.11)
RDW: 12.5 % (ref 11.5–15.5)
WBC: 5.1 K/uL (ref 4.0–10.5)

## 2023-11-27 NOTE — Telephone Encounter (Signed)
 Noted

## 2023-12-02 ENCOUNTER — Institutional Professional Consult (permissible substitution): Admitting: Plastic Surgery

## 2023-12-03 NOTE — Assessment & Plan Note (Signed)
 Tolerating statin, encouraged heart healthy diet, avoid trans fats, minimize simple carbs and saturated fats. Increase exercise as tolerated. praluent 

## 2023-12-03 NOTE — Assessment & Plan Note (Signed)
 RRR today

## 2023-12-03 NOTE — Assessment & Plan Note (Signed)
 On Levothyroxine, continue to monitor

## 2023-12-03 NOTE — Assessment & Plan Note (Signed)
 hgba1c acceptable, minimize simple carbs. Increase exercise as tolerated.

## 2023-12-04 ENCOUNTER — Encounter: Payer: Self-pay | Admitting: Family Medicine

## 2023-12-04 ENCOUNTER — Ambulatory Visit (INDEPENDENT_AMBULATORY_CARE_PROVIDER_SITE_OTHER): Payer: Medicare Other | Admitting: Family Medicine

## 2023-12-04 VITALS — Ht 64.5 in

## 2023-12-04 DIAGNOSIS — E782 Mixed hyperlipidemia: Secondary | ICD-10-CM

## 2023-12-04 DIAGNOSIS — E039 Hypothyroidism, unspecified: Secondary | ICD-10-CM | POA: Diagnosis not present

## 2023-12-04 DIAGNOSIS — I480 Paroxysmal atrial fibrillation: Secondary | ICD-10-CM | POA: Diagnosis not present

## 2023-12-04 DIAGNOSIS — R739 Hyperglycemia, unspecified: Secondary | ICD-10-CM

## 2023-12-04 DIAGNOSIS — Z780 Asymptomatic menopausal state: Secondary | ICD-10-CM

## 2023-12-04 DIAGNOSIS — H547 Unspecified visual loss: Secondary | ICD-10-CM | POA: Diagnosis not present

## 2023-12-04 DIAGNOSIS — Z124 Encounter for screening for malignant neoplasm of cervix: Secondary | ICD-10-CM

## 2023-12-04 DIAGNOSIS — H43393 Other vitreous opacities, bilateral: Secondary | ICD-10-CM | POA: Diagnosis not present

## 2023-12-04 DIAGNOSIS — R001 Bradycardia, unspecified: Secondary | ICD-10-CM | POA: Diagnosis not present

## 2023-12-04 DIAGNOSIS — E2839 Other primary ovarian failure: Secondary | ICD-10-CM | POA: Diagnosis not present

## 2023-12-04 LAB — HEMOGLOBIN A1C: Hgb A1c MFr Bld: 5.9 % (ref 4.6–6.5)

## 2023-12-04 MED ORDER — LEVOTHYROXINE SODIUM 88 MCG PO TABS
88.0000 ug | ORAL_TABLET | Freq: Every day | ORAL | 3 refills | Status: DC
Start: 2023-12-04 — End: 2024-01-21

## 2023-12-04 NOTE — Progress Notes (Signed)
 Subjective:    Patient ID: Crystal Russell, female    DOB: 1950-12-02, 73 y.o.   MRN: [id number]  Chief Complaint  Patient presents with  . Medical Management of Chronic Issues    Patient presents today for a 6 month follow-up  . Quality Metric Gaps    AWV    HPI Discussed the use of AI scribe software for clinical note transcription with the patient, who gave verbal consent to proceed.  History of Present Illness Crystal Russell is a 73 year old female who presents with nighttime visual disturbances.  She experiences black spots in the center of her vision upon waking at night, which began approximately three to four weeks ago. These episodes occur only at night and not every night, but have been happening numerous nights a week, including the previous night. The spots are present in both eyes, with a 'ghost' effect in the right eye and a more pronounced effect in the left eye. The spots sometimes appear to have holes and can look 'bloody'. When she closes her eyes, the spots appear white in the same dark space. She initially thought these were ocular migraines, which she has experienced before, but the episodes have persisted after her recent eye surgery.  She underwent eye surgery on December 01, 2023, and noticed the visual disturbances consecutively for two days post-surgery. She consulted with her ophthalmologist and optometrist, who both conducted thorough eye exams and did not find any abnormalities such as floaters or issues with the optic nerve.  She has a history of high cholesterol and is currently taking atorvastatin  at a dose of half a tablet twice a week. She previously attempted to use Praluent  but experienced severe side effects, including painful leg reactions that lasted about a month. She also reports a significant drop in her heart rate at night, going as low as 40 beats per minute, which she monitors using a Fitbit. She has a cardiologist and is scheduled for a follow-up in  October.  She experiences morning headaches that typically resolve by the time she showers. She does not check her blood pressure in the morning. She has a history of thyroid  issues and is currently on levothyroxine  75 mcg daily. She is concerned about her thyroid  levels, noting that her TSH levels have been increasing, although her T4 remains in the normal range.  She has a history of diverticulitis, which has resulted in scar tissue in her colon. She manages this condition with daily Miralax  and ensures adequate hydration to prevent constipation. She also notes a history of balance issues, which she attributes to a previous foot problem that has since been resolved.    Past Medical History:  Diagnosis Date  . Abdominal wall hernia 08/04/2012  . Atrial fibrillation (HCC) 2008   REPORTS SHE WAS AT [place], UPON WAKING I FELT MY HEART GOING BOOM BOOM BOOM BEATING OUT OF MY CHEST. I WENT TO HOSPITAL AND THEY ISED MEDICINE TO CONVERT ME , IT TOOK OVER 10 HOURS TO CONVERT ME. BUT AFTER THAT IVE NEVER HAD ANY ISSUES SINCE. I USED TO HAVE A CARDIOLOGUIST AS MY PRIMARY IN [place]  BUT IHAVENT BEEN TO A CARDIOLOGIST SINCE I MOVED DOWN HERE.   SABRA Chicken pox as a child  . Depression   . Depression with anxiety 12/27/2012  . Diverticulitis   . DJD (degenerative joint disease) 08/05/2012  . DVT (deep venous thrombosis) (HCC) 2018  . Fifth disease 1989   DX AFTER EXPOSURE FROM HER CHILDREN;  DEVELOPED RHEUMATOID ARTHRITIS DURING COURSE OF ILLNESS. REPORTS:   I DONT HAVE IT ANYMORE SINCE THE DISEASE IS GONE   . H/O gestational diabetes mellitus, not currently pregnant 08/05/2012   History of   . Heart murmur    echo 2012; I HAVE A FUNCTIONAL MURMUR   . Hyperlipidemia   . Hypothyroidism   . Knee effusion, right    while on Eliquis, discontinued Eliquis after DVT resolved  . Measles as a child  . Migraines   . Other and unspecified hyperlipidemia 08/05/2012  . Paroxysmal A-fib (HCC)   . Shingles 43  .  Spondylisthesis 2012   L5/S1 grade 3  . Varicose veins 08/05/2012   Extending up to right groin B/l LE  . Ventral hernia 08/05/2012  . Wears glasses     Past Surgical History:  Procedure Laterality Date  . BREAST DUCTAL SYSTEM EXCISION Right 05/05/2014   Procedure: RIGHT BREAST DUCT EXCISION;  Surgeon: Donnice Bury, MD;  Location: [facility];  Service: General;  Laterality: Right;  . COLONOSCOPY    . DILATION AND CURETTAGE OF UTERUS     x4  . EXCISIONAL TOTAL KNEE ARTHROPLASTY WITH ANTIBIOTIC SPACERS Right 09/14/2020   Procedure: Irrigation and debriement with poly exchange versus resection of right total knee arthroplasty and placement of antibitiotic spacer;  Surgeon: Ernie Donnice, MD;  Location: [facility];  Service: Orthopedics;  Laterality: Right;  . MOUTH SURGERY    . TOTAL KNEE ARTHROPLASTY Right 07/01/2017   Procedure: RIGHT TOTAL KNEE ARTHROPLASTY;  Surgeon: Ernie Donnice, MD;  Location: [facility];  Service: Orthopedics;  Laterality: Right;  70 mins  . VARICOSE VEIN SURGERY  1995   right leg    Family History  Problem Relation Age of Onset  . Cancer Mother        lung- smoker  . Liver disease Mother   . Cancer Father        lung- smoker  . Peripheral Artery Disease Sister   . Heart attack Paternal Uncle   . Dementia Maternal Grandmother   . Heart attack Maternal Grandfather   . Asthma Son   . Ulcerative colitis Son   . Asthma Son   . Ulcerative colitis Son   . Pancreatic cancer Other   . Stomach cancer Other   . Colon cancer Neg Hx   . Cystic fibrosis Neg Hx   . Esophageal cancer Neg Hx     Social History   Socioeconomic History  . Marital status: Married    Spouse name: Not on file  . Number of children: Not on file  . Years of education: Not on file  . Highest education level: Associate degree: occupational, Scientist, product/process development, or vocational program  Occupational History  . Not on file  Tobacco Use  . Smoking status: Former    Types: Cigarettes  .  Smokeless tobacco: Never  Vaping Use  . Vaping status: Never Used  Substance and Sexual Activity  . Alcohol use: No  . Drug use: No  . Sexual activity: Yes  Other Topics Concern  . Not on file  Social History Narrative  . Not on file   Social Drivers of Health   Financial Resource Strain: Low Risk  (11/25/2023)   Overall Financial Resource Strain (CARDIA)   . Difficulty of Paying Living Expenses: Not hard at all  Food Insecurity: No Food Insecurity (11/25/2023)   Hunger Vital Sign   . Worried About Programme researcher, broadcasting/film/video in the Last Year: Never  true   . Ran Out of Food in the Last Year: Never true  Transportation Needs: No Transportation Needs (11/25/2023)   PRAPARE - Transportation   . Lack of Transportation (Medical): No   . Lack of Transportation (Non-Medical): No  Physical Activity: Sufficiently Active (11/25/2023)   Exercise Vital Sign   . Days of Exercise per Week: 5 days   . Minutes of Exercise per Session: 40 min  Stress: No Stress Concern Present (11/25/2023)   [employer] of Occupational Health - Occupational Stress Questionnaire   . Feeling of Stress: Not at all  Social Connections: Socially Integrated (11/25/2023)   Social Connection and Isolation Panel   . Frequency of Communication with Friends and Family: More than three times a week   . Frequency of Social Gatherings with Friends and Family: Once a week   . Attends Religious Services: More than 4 times per year   . Active Member of Clubs or Organizations: Yes   . Attends Banker Meetings: 1 to 4 times per year   . Marital Status: Married  Catering manager Violence: Not on file    Outpatient Medications Prior to Visit  Medication Sig Dispense Refill  . aspirin  EC 81 MG tablet Take 81 mg by mouth daily. Swallow whole.    . atorvastatin  (LIPITOR) 10 MG tablet TAKE ONE-HALF TABLET BY MOUTH DAILY 45 tablet 2  . Coenzyme Q10 300 MG CAPS Take 300 mg by mouth every evening.    . polyethylene glycol  (MIRALAX  / GLYCOLAX ) 17 g packet Take 17 g by mouth 2 (two) times daily.    . levothyroxine  (SYNTHROID ) 75 MCG tablet Take 75 mcg by mouth daily.     No facility-administered medications prior to visit.    Allergies  Allergen Reactions  . Ciprofloxacin  Other (See Comments)    Abdominal pain-06/17/22-patient states she is not allergic, no rash or swelling   . Crestor [Rosuvastatin] Other (See Comments)    Joint pain w/fibromyalgia  . Praluent  [Alirocumab ] Other (See Comments)    Bruising and pain at injection site  . Pravachol [Pravastatin] Other (See Comments)    Joint pain w/fibromyalgia  . Cefazolin  Rash  . Rifampin  Rash    Review of Systems  Constitutional:  Negative for fever and malaise/fatigue.  HENT:  Negative for congestion.   Eyes:  Negative for photophobia, pain, discharge and redness.  Respiratory:  Negative for shortness of breath.   Cardiovascular:  Negative for chest pain, palpitations and leg swelling.  Gastrointestinal:  Positive for constipation. Negative for abdominal pain, blood in stool and nausea.  Genitourinary:  Negative for dysuria and frequency.  Musculoskeletal:  Negative for falls.  Skin:  Negative for rash.  Neurological:  Positive for headaches. Negative for dizziness and loss of consciousness.  Endo/Heme/Allergies:  Negative for environmental allergies.  Psychiatric/Behavioral:  Negative for depression. The patient is not nervous/anxious.        Objective:    Physical Exam Constitutional:      General: She is not in acute distress.    Appearance: Normal appearance. She is well-developed. She is not toxic-appearing.  HENT:     Head: Normocephalic and atraumatic.     Right Ear: External ear normal.     Left Ear: External ear normal.     Nose: Nose normal.  Eyes:     General: No scleral icterus.       Right eye: No discharge.        Left eye: No discharge.  Extraocular Movements: Extraocular movements intact.     Conjunctiva/sclera:  Conjunctivae normal.     Pupils: Pupils are equal, round, and reactive to light.  Neck:     Thyroid : No thyromegaly.  Cardiovascular:     Rate and Rhythm: Regular rhythm. Bradycardia present.     Heart sounds: Normal heart sounds. No murmur heard. Pulmonary:     Effort: Pulmonary effort is normal. No respiratory distress.     Breath sounds: Normal breath sounds.  Abdominal:     General: Bowel sounds are normal.     Palpations: Abdomen is soft.     Tenderness: There is no abdominal tenderness. There is no guarding.  Musculoskeletal:        General: Normal range of motion.     Cervical back: Neck supple.  Lymphadenopathy:     Cervical: No cervical adenopathy.  Skin:    General: Skin is warm and dry.  Neurological:     Mental Status: She is alert and oriented to person, place, and time.  Psychiatric:        Mood and Affect: Mood normal.        Behavior: Behavior normal.        Thought Content: Thought content normal.        Judgment: Judgment normal.     Ht 5' 4.5 (1.638 m)   BMI 21.97 kg/m  Wt Readings from Last 3 Encounters:  11/26/23 130 lb (59 kg)  11/05/23 130 lb (59 kg)  06/02/23 136 lb (61.7 kg)    Diabetic Foot Exam - Simple   No data filed    Lab Results  Component Value Date   WBC 5.1 11/26/2023   HGB 12.4 11/26/2023   HCT 37.0 11/26/2023   PLT 267.0 11/26/2023   GLUCOSE 90 11/25/2023   CHOL 169 11/25/2023   TRIG 46.0 11/25/2023   HDL 67.10 11/25/2023   LDLCALC 93 11/25/2023   ALT 18 11/25/2023   AST 28 11/25/2023   NA 134 (L) 11/25/2023   K 4.3 11/25/2023   CL 96 11/25/2023   CREATININE 0.65 11/25/2023   BUN 18 11/25/2023   CO2 31 11/25/2023   TSH 5.12 11/26/2023   HGBA1C 5.5 06/02/2023    Lab Results  Component Value Date   TSH 5.12 11/26/2023   Lab Results  Component Value Date   WBC 5.1 11/26/2023   HGB 12.4 11/26/2023   HCT 37.0 11/26/2023   MCV 86.8 11/26/2023   PLT 267.0 11/26/2023   Lab Results  Component Value Date   NA  134 (L) 11/25/2023   K 4.3 11/25/2023   CO2 31 11/25/2023   GLUCOSE 90 11/25/2023   BUN 18 11/25/2023   CREATININE 0.65 11/25/2023   BILITOT 0.9 11/25/2023   ALKPHOS 62 11/25/2023   AST 28 11/25/2023   ALT 18 11/25/2023   PROT 6.7 11/25/2023   ALBUMIN 4.2 11/25/2023   CALCIUM  9.2 11/25/2023   ANIONGAP 10 11/27/2020   EGFR 96 11/05/2023   GFR 87.61 11/25/2023   Lab Results  Component Value Date   CHOL 169 11/25/2023   Lab Results  Component Value Date   HDL 67.10 11/25/2023   Lab Results  Component Value Date   LDLCALC 93 11/25/2023   Lab Results  Component Value Date   TRIG 46.0 11/25/2023   Lab Results  Component Value Date   CHOLHDL 3 11/25/2023   Lab Results  Component Value Date   HGBA1C 5.5 06/02/2023  Assessment & Plan:  Floaters in visual field, bilateral -     US  Carotid Bilateral; Future -     MR BRAIN WO CONTRAST; Future  Hyperglycemia Assessment & Plan: hgba1c acceptable, minimize simple carbs. Increase exercise as tolerated.    Orders: -     US  Carotid Bilateral; Future -     Hemoglobin A1c  Hyperlipidemia, mixed Assessment & Plan: Tolerating statin, encouraged heart healthy diet, avoid trans fats, minimize simple carbs and saturated fats. Increase exercise as tolerated. Mostly tolerating Atorvastatin  5 mg twice weekly with only occasional leg cramps  Orders: -     Lipoprotein A (LPA) -     US  Carotid Bilateral; Future  Hypothyroidism, unspecified type Assessment & Plan: On Levothyroxine , continue to monitor   Paroxysmal A-fib (HCC) Assessment & Plan: RRR today  Orders: -     US  Carotid Bilateral; Future -     MR BRAIN WO CONTRAST; Future  Bradycardia -     US  Carotid Bilateral; Future -     MR BRAIN WO CONTRAST; Future  Visual loss -     MR BRAIN WO CONTRAST; Future  Post-menopausal -     DG Bone Density; Future  Estrogen deficiency -     DG Bone Density; Future  Cervical cancer screening -     Ambulatory  referral to Obstetrics / Gynecology  Other orders -     Levothyroxine  Sodium; Take 1 tablet (88 mcg total) by mouth daily.  Dispense: 30 tablet; Refill: 3    Assessment and Plan Assessment & Plan Bilateral visual disturbances with nocturnal black spots Intermittent nocturnal black spots in the center of vision, occurring in both eyes, with associated bradycardia. Differential includes vascular insufficiency to the optic nerve, possibly related to carotid artery disease or other vascular issues. Ophthalmology and optometry evaluations did not reveal any obvious ocular pathology. Further imaging is needed to assess for potential vascular or neurologic causes. She is concerned about potential loss of eyesight and is willing to undergo further testing to address any underlying issues. - Order carotid doppler ultrasound to assess for carotid artery disease. - Order MRI of the brain to evaluate for potential strokes or other neurologic causes. - Encourage hydration and protein intake in the evening to prevent hypoglycemia. - Follow up with cardiology for further evaluation of bradycardia and potential vascular issues. - Obtain records from ophthalmology and optometry for further review.  Bradycardia and paroxysmal atrial fibrillation Bradycardia with heart rate dropping to 40 bpm at night, potentially contributing to visual disturbances. Paroxysmal atrial fibrillation is a known condition. Further evaluation by cardiology is warranted to assess the relationship between bradycardia and visual symptoms. She monitors her heart rate using a Fitbit and has noted significant drops during episodes of visual disturbances. - Follow up with cardiology for further evaluation and management of bradycardia and paroxysmal atrial fibrillation.  Hypothyroidism Current levothyroxine  dose may be insufficient as TSH levels are increasing. Symptoms include feeling cold and fatigue. T4 levels are within normal range, but  she prefers a lower TSH level. Risks of increasing levothyroxine  dose include potential overtreatment leading to hyperthyroid symptoms. The decision to increase the dose was made with consideration of her symptoms and preference for a lower TSH level. - Increase levothyroxine  dose from 75 mcg to 88 mcg daily. - Recheck thyroid  function tests in 10-12 weeks to assess response to dose adjustment.  Mixed hyperlipidemia Currently on atorvastatin  10 mg half tablet twice weekly. Previous attempt with alirocumab  (Praluent ) resulted in  severe adverse reaction. Considering increasing atorvastatin  frequency to improve lipid control, but cautious due to muscle cramps. She is tolerating the current regimen but experiences occasional muscle cramps. - Continue atorvastatin  10 mg half tablet twice weekly. - Consider increasing atorvastatin  to three times weekly if tolerated. - Ensure adequate hydration and continue Coenzyme Q10 supplementation to mitigate muscle cramps.  Chronic constipation with colonic stricture and diverticular disease Chronic constipation managed with polyethylene glycol and saccharomyces boulardii. Colonic stricture and diverticular disease require careful management to prevent complications such as bowel obstruction or diverticulitis. She is proactive in managing her condition to avoid complications. - Continue polyethylene glycol 17 g orally twice daily. - Continue saccharomyces boulardii 250 mg orally twice daily. - Ensure adequate hydration and fiber intake to maintain bowel regularity.  General Health Maintenance Routine health maintenance is not fully up to date; last mammogram was in June 2021 and another is recommended this year if the patient is willing. DEXA scan and Pap smear are due for routine screening. - Order DEXA scan for osteoporosis screening. - Refer to OB/GYN for Pap smear.  Follow-Up Follow-up plans discussed for ongoing management and evaluation of current health  issues. - Follow up with cardiology for bradycardia and paroxysmal atrial fibrillation. - Follow up after MRI and carotid doppler results are available. - Schedule follow-up visit to reassess thyroid  function and medication adjustments.  Recording duration: 32 minutes     Harlene Horton, MD

## 2023-12-05 ENCOUNTER — Ambulatory Visit: Payer: Self-pay | Admitting: Family Medicine

## 2023-12-06 LAB — LIPOPROTEIN A (LPA): Lipoprotein (a): 155 nmol/L — ABNORMAL HIGH (ref <75)

## 2023-12-07 ENCOUNTER — Ambulatory Visit (HOSPITAL_BASED_OUTPATIENT_CLINIC_OR_DEPARTMENT_OTHER)
Admission: RE | Admit: 2023-12-07 | Discharge: 2023-12-07 | Disposition: A | Discharge status: Home / Self Care | Source: Ambulatory Visit | Attending: Family Medicine | Admitting: Family Medicine

## 2023-12-07 DIAGNOSIS — R001 Bradycardia, unspecified: Secondary | ICD-10-CM | POA: Insufficient documentation

## 2023-12-07 DIAGNOSIS — R739 Hyperglycemia, unspecified: Secondary | ICD-10-CM | POA: Diagnosis not present

## 2023-12-07 DIAGNOSIS — H43393 Other vitreous opacities, bilateral: Secondary | ICD-10-CM | POA: Insufficient documentation

## 2023-12-07 DIAGNOSIS — I6521 Occlusion and stenosis of right carotid artery: Secondary | ICD-10-CM | POA: Diagnosis not present

## 2023-12-07 DIAGNOSIS — E782 Mixed hyperlipidemia: Secondary | ICD-10-CM | POA: Insufficient documentation

## 2023-12-07 DIAGNOSIS — H539 Unspecified visual disturbance: Secondary | ICD-10-CM | POA: Diagnosis not present

## 2023-12-07 DIAGNOSIS — I480 Paroxysmal atrial fibrillation: Secondary | ICD-10-CM | POA: Diagnosis not present

## 2023-12-07 DIAGNOSIS — H547 Unspecified visual loss: Secondary | ICD-10-CM | POA: Diagnosis not present

## 2023-12-09 NOTE — Progress Notes (Signed)
 Patient reviewed via MyChart.

## 2023-12-10 DIAGNOSIS — H353132 Nonexudative age-related macular degeneration, bilateral, intermediate dry stage: Secondary | ICD-10-CM | POA: Diagnosis not present

## 2023-12-10 DIAGNOSIS — H43813 Vitreous degeneration, bilateral: Secondary | ICD-10-CM | POA: Diagnosis not present

## 2023-12-10 DIAGNOSIS — H35432 Paving stone degeneration of retina, left eye: Secondary | ICD-10-CM | POA: Diagnosis not present

## 2023-12-10 DIAGNOSIS — H2512 Age-related nuclear cataract, left eye: Secondary | ICD-10-CM | POA: Diagnosis not present

## 2023-12-10 DIAGNOSIS — H43393 Other vitreous opacities, bilateral: Secondary | ICD-10-CM | POA: Diagnosis not present

## 2023-12-11 ENCOUNTER — Telehealth: Payer: Self-pay

## 2023-12-11 ENCOUNTER — Other Ambulatory Visit: Payer: Self-pay | Admitting: Vascular Surgery

## 2023-12-11 DIAGNOSIS — G453 Amaurosis fugax: Secondary | ICD-10-CM

## 2023-12-11 NOTE — Telephone Encounter (Signed)
 Called Radiology reading number @ [phone] to request reading and they stated it will be a 24 hour turn around time.  Copied from CRM [id number]. Topic: Clinical - Lab/Test Results >> Dec 11, 2023  1:22 PM Crystal Russell wrote: Reason for CRM: Patient wants to know MRI results from 12/07/23 - she would like if there can be a stat request put in for the results. Please call her with a update at [phone] (M)

## 2023-12-12 ENCOUNTER — Telehealth: Payer: Self-pay

## 2023-12-12 NOTE — Telephone Encounter (Signed)
 Patient was advised again that it will be a 24 hour turn around time per radiologist office.    Copied from CRM #[id number]. Topic: Clinical - Lab/Test Results >> Dec 12, 2023 12:58 PM Crystal Russell wrote: Reason for CRM: Pt called in to receive her MRI results from 8/31. She was supposed to have cataract surgery but now has been cancelled because surgeon did not feel comfortable without the mri results and would like to reschedule the surgery. Please reach out to pt with the results, thank you.

## 2023-12-12 NOTE — Telephone Encounter (Signed)
 MyChart message send to patient.   Copied from CRM #[id number]. Topic: General - Other >> Dec 12, 2023  1:02 PM Sasha M wrote: Reason for CRM: Pt went lab to have blood work drawn on 8/14 and office lost blood work and now [employer] is showing a missed lab appt. Pt says she went to have blood redrawn on 8/19 at [facility] but it was only partial blood work.  Pt wants to know if this missed appt can be removed seeing as how it is not a missed appt and also is questioning whether she will be charged.

## 2023-12-13 ENCOUNTER — Other Ambulatory Visit: Payer: Self-pay | Admitting: Internal Medicine

## 2023-12-14 ENCOUNTER — Encounter: Payer: Self-pay | Admitting: Family Medicine

## 2023-12-22 ENCOUNTER — Encounter: Payer: Self-pay | Admitting: Family Medicine

## 2023-12-24 ENCOUNTER — Encounter: Payer: Self-pay | Admitting: Family Medicine

## 2023-12-24 ENCOUNTER — Other Ambulatory Visit: Payer: Self-pay | Admitting: Family

## 2023-12-24 DIAGNOSIS — H539 Unspecified visual disturbance: Secondary | ICD-10-CM

## 2023-12-24 DIAGNOSIS — Q2112 Patent foramen ovale: Secondary | ICD-10-CM

## 2023-12-31 DIAGNOSIS — H2512 Age-related nuclear cataract, left eye: Secondary | ICD-10-CM | POA: Diagnosis not present

## 2024-01-06 ENCOUNTER — Ambulatory Visit (HOSPITAL_BASED_OUTPATIENT_CLINIC_OR_DEPARTMENT_OTHER)
Admission: RE | Admit: 2024-01-06 | Discharge: 2024-01-06 | Disposition: A | Discharge status: Home / Self Care | Source: Ambulatory Visit | Attending: Family Medicine | Admitting: Family Medicine

## 2024-01-06 DIAGNOSIS — Z780 Asymptomatic menopausal state: Secondary | ICD-10-CM | POA: Diagnosis not present

## 2024-01-06 DIAGNOSIS — E2839 Other primary ovarian failure: Secondary | ICD-10-CM | POA: Diagnosis not present

## 2024-01-06 DIAGNOSIS — M8580 Other specified disorders of bone density and structure, unspecified site: Secondary | ICD-10-CM | POA: Diagnosis not present

## 2024-01-07 NOTE — Progress Notes (Signed)
 Patient reviewed via MyChart.

## 2024-01-09 ENCOUNTER — Encounter: Admitting: Vascular Surgery

## 2024-01-09 ENCOUNTER — Encounter (HOSPITAL_COMMUNITY)

## 2024-01-16 ENCOUNTER — Encounter: Payer: Self-pay | Admitting: Family Medicine

## 2024-01-19 ENCOUNTER — Ambulatory Visit: Admitting: Family Medicine

## 2024-01-19 ENCOUNTER — Encounter: Payer: Self-pay | Admitting: Gastroenterology

## 2024-01-19 ENCOUNTER — Telehealth: Payer: Self-pay | Admitting: Gastroenterology

## 2024-01-19 ENCOUNTER — Ambulatory Visit: Payer: Self-pay | Admitting: Gastroenterology

## 2024-01-19 ENCOUNTER — Ambulatory Visit (INDEPENDENT_AMBULATORY_CARE_PROVIDER_SITE_OTHER): Admitting: Gastroenterology

## 2024-01-19 ENCOUNTER — Ambulatory Visit (HOSPITAL_BASED_OUTPATIENT_CLINIC_OR_DEPARTMENT_OTHER)
Admission: RE | Admit: 2024-01-19 | Discharge: 2024-01-19 | Disposition: A | Discharge status: Home / Self Care | Source: Ambulatory Visit | Attending: Gastroenterology | Admitting: Gastroenterology

## 2024-01-19 ENCOUNTER — Ambulatory Visit: Payer: Self-pay

## 2024-01-19 ENCOUNTER — Other Ambulatory Visit (INDEPENDENT_AMBULATORY_CARE_PROVIDER_SITE_OTHER)

## 2024-01-19 DIAGNOSIS — R110 Nausea: Secondary | ICD-10-CM | POA: Insufficient documentation

## 2024-01-19 DIAGNOSIS — R109 Unspecified abdominal pain: Secondary | ICD-10-CM

## 2024-01-19 DIAGNOSIS — R519 Headache, unspecified: Secondary | ICD-10-CM

## 2024-01-19 DIAGNOSIS — K5730 Diverticulosis of large intestine without perforation or abscess without bleeding: Secondary | ICD-10-CM | POA: Diagnosis not present

## 2024-01-19 DIAGNOSIS — R590 Localized enlarged lymph nodes: Secondary | ICD-10-CM | POA: Diagnosis not present

## 2024-01-19 DIAGNOSIS — R6883 Chills (without fever): Secondary | ICD-10-CM | POA: Insufficient documentation

## 2024-01-19 DIAGNOSIS — R630 Anorexia: Secondary | ICD-10-CM | POA: Diagnosis not present

## 2024-01-19 DIAGNOSIS — Z8719 Personal history of other diseases of the digestive system: Secondary | ICD-10-CM

## 2024-01-19 DIAGNOSIS — R634 Abnormal weight loss: Secondary | ICD-10-CM | POA: Insufficient documentation

## 2024-01-19 DIAGNOSIS — R5383 Other fatigue: Secondary | ICD-10-CM

## 2024-01-19 DIAGNOSIS — R140 Abdominal distension (gaseous): Secondary | ICD-10-CM

## 2024-01-19 DIAGNOSIS — K5792 Diverticulitis of intestine, part unspecified, without perforation or abscess without bleeding: Secondary | ICD-10-CM | POA: Diagnosis not present

## 2024-01-19 LAB — CBC WITH DIFFERENTIAL/PLATELET
Basophils Absolute: 0 K/uL (ref 0.0–0.1)
Basophils Relative: 0.6 % (ref 0.0–3.0)
Eosinophils Absolute: 0 K/uL (ref 0.0–0.7)
Eosinophils Relative: 0.7 % (ref 0.0–5.0)
HCT: 38 % (ref 36.0–46.0)
Hemoglobin: 12.7 g/dL (ref 12.0–15.0)
Lymphocytes Relative: 27.3 % (ref 12.0–46.0)
Lymphs Abs: 1.4 K/uL (ref 0.7–4.0)
MCHC: 33.5 g/dL (ref 30.0–36.0)
MCV: 86.5 fl (ref 78.0–100.0)
Monocytes Absolute: 0.4 K/uL (ref 0.1–1.0)
Monocytes Relative: 8 % (ref 3.0–12.0)
Neutro Abs: 3.3 K/uL (ref 1.4–7.7)
Neutrophils Relative %: 63.4 % (ref 43.0–77.0)
Platelets: 243 K/uL (ref 150.0–400.0)
RBC: 4.4 Mil/uL (ref 3.87–5.11)
RDW: 12.9 % (ref 11.5–15.5)
WBC: 5.2 K/uL (ref 4.0–10.5)

## 2024-01-19 LAB — COMPREHENSIVE METABOLIC PANEL WITH GFR
ALT: 14 U/L (ref 0–35)
AST: 23 U/L (ref 0–37)
Albumin: 4.5 g/dL (ref 3.5–5.2)
Alkaline Phosphatase: 69 U/L (ref 39–117)
BUN: 8 mg/dL (ref 6–23)
CO2: 28 meq/L (ref 19–32)
Calcium: 9.5 mg/dL (ref 8.4–10.5)
Chloride: 94 meq/L — ABNORMAL LOW (ref 96–112)
Creatinine, Ser: 0.61 mg/dL (ref 0.40–1.20)
GFR: 88.87 mL/min (ref >60.00)
Glucose, Bld: 97 mg/dL (ref 70–99)
Potassium: 4.4 meq/L (ref 3.5–5.1)
Sodium: 130 meq/L — ABNORMAL LOW (ref 135–145)
Total Bilirubin: 1.3 mg/dL — ABNORMAL HIGH (ref 0.2–1.2)
Total Protein: 6.9 g/dL (ref 6.0–8.3)

## 2024-01-19 LAB — C-REACTIVE PROTEIN: CRP: <0.5 mg/dL (ref 0.5–20.0)

## 2024-01-19 LAB — TSH: TSH: 2.7 u[IU]/mL (ref 0.35–5.50)

## 2024-01-19 LAB — SEDIMENTATION RATE: Sed Rate: 8 mm/h (ref 0–30)

## 2024-01-19 MED ORDER — IOHEXOL 300 MG/ML  SOLN
75.0000 mL | Freq: Once | INTRAMUSCULAR | Status: AC | PRN
  Administered 2024-01-19: 75 mL via INTRAVENOUS

## 2024-01-19 NOTE — Telephone Encounter (Signed)
 FYI Only or Action Required?: FYI only for provider.  Patient was last seen in primary care on 12/04/2023 by Domenica Harlene LABOR, MD.  Called Nurse Triage reporting Abdominal Pain.  Symptoms began several weeks ago.  Interventions attempted: Rest, hydration, or home remedies.  Symptoms are: gradually worsening.  Triage Disposition: See PCP When Office is Open (Within 3 Days)  Patient/caregiver understands and will follow disposition?: Yes, will follow disposition  Copied from CRM #[id number]. Topic: Clinical - Red Word Triage >> Jan 19, 2024  8:57 AM Pinkey ORN wrote: Red Word that prompted transfer to Nurse Triage: Diverticulosis Flare Up >> Jan 19, 2024  8:58 AM Pinkey ORN wrote: Patient states she's experiencing headaches, she nauseous and overall under the weather.   Reason for Disposition  Nausea lasts > 1 week  Answer Assessment - Initial Assessment Questions 1. NAUSEA SEVERITY: How bad is the nausea? (e.g., mild, moderate, severe; dehydration, weight loss)     Bad enough to take the medicine I have left over 2. ONSET: When did the nausea begin?     About 3 weeks 3. VOMITING: Any vomiting? If Yes, ask: How many times today?     denies 4. RECURRENT SYMPTOM: Have you had nausea before? If Yes, ask: When was the last time? What happened that time?     Hx of diverticulosis, states this feels the same,  Pt states she has bubbly gut, pt states that she has been on a clear liquid diet for about a week. Pt states that she does not have abd pain. Pt states that when eating solid foods she has bloating.  Protocols used: Nausea-A-AH

## 2024-01-19 NOTE — Telephone Encounter (Signed)
 Appt scheduled

## 2024-01-19 NOTE — Telephone Encounter (Signed)
 Returned patient call. Patient scheduled today with Lauraine, [place]. Patient voiced understanding.

## 2024-01-19 NOTE — Telephone Encounter (Signed)
 Inbound call from patient stating she feels like she's having a diverticulitis flare up. She's unable to sleep at night with the cramping and this morning she felt unable to get out the bed. Patient would like to be seen in office but cannot wait for next availability in November.would like to know if nurses have anything sooner or can advise her on what to do. Requesting a call back  Please advise  Thank you

## 2024-01-19 NOTE — Progress Notes (Signed)
 Agree with assessment and plan as outlined.

## 2024-01-19 NOTE — Progress Notes (Signed)
 Crystal Russell [id number] 1950/10/19   Chief Complaint: Nausea, increased gas, headache and chills  Referring Provider: Domenica Harlene LABOR, MD Primary GI MD: Dr. Leigh  HPI: Crystal Russell is a 73 y.o. female with past medical history of A-fib, depression and anxiety, diverticulitis, DVT 2018, HLD, hypothyroidism who presents today for a complaint of nausea, gas, headache, chills.    Last seen in office 06/20/2022 by Greig Corti, PA-C for diverticulitis.  Patient had called on 06/17/2022 with recurrent symptoms of low mid abdominal pain, cramping, increased pain with walking, etc.  This occurred in the setting of being constipated for a few days prior.  She was started on oral Cipro  and Flagyl , had labs done which showed significant elevation in CRP and sed rate of 39.  She had improved on antibiotics and symptoms settle down quite a bit.  CT was scheduled but not able to be done until 06/26/2022.  She was noted to be overdue for repeat colonoscopy.  Also noted to have history of colonization with C. difficile but has never had active C. difficile infection. Plan at that time was to complete 10-day course of Cipro  and Flagyl , continue Florastor for 4 weeks, cancel CT scan if symptoms completely resolved, continue MiraLAX  as needed, scheduled for repeat colonoscopy.  CT showed acute uncomplicated sigmoid diverticulitis without perforation or abscess.  Advised to continue 14-day course of antibiotics and continue Florastor.  She underwent colonoscopy 08/08/2022 which showed severe diverticulosis, tortuous colon, no polyps.  No further colonoscopy recommended.  Patient called 02/24/2023 with concern for recurrent diverticulitis.  Dr. Leigh advised Augmentin  twice daily for a week (Cipro  intolerance listed).  Patient called today with concern for diverticulitis flare.  Scheduled for same-day appointment.   Patient here today with her husband.  States that she is unsure what is going on.  In  the last 3 to 4 weeks she has had increased abdominal gas, nausea, belching, headaches, and chills without fever.  Has some abdominal bloating which is relieved by passing gas.  She took some Tylenol  which helped with headache and chills.  Has checked her temperature at home and has never had a fever.  She did put herself on a liquid diet about a week ago and felt a little better but not completely.  Has had small amounts of more solid foods including applesauce, chicken and her soup, crackers, and yogurt.  Even eating a small amount of food causes bubbling in her stomach.  She has been taking MiraLAX  daily to avoid constipation, which is a fear of hers.  Had 1 episode of diarrhea a few weeks ago and otherwise stools have been normal.  No blood in stool or melena.  States she is having regular bowel movements daily, though only passing a small amount right now which she attributes to eating less.  She denies any abdominal pain.  Abdominal bloating and associated discomfort is relieved by passing gas.  She is unsure whether this is a flare of her diverticulitis, as in the past she has had pain or cramping, and this time does not have pain or fever.  However, she is suspicious of this since lack of food makes her feel better.  Did feel better after day or 2 of antibiotics last time she was treated for diverticulitis with Augmentin .  States she has lost about 4 pounds since going on a liquid diet.  She has Zofran  at home but has not been taking.  Denies any vomiting.  Denies recent antibiotic use.  Did have cataract surgery 2 weeks ago and was on antibiotic eyedrops but nothing orally.  States she has intermittent heartburn and acid reflux.  Does have history of GERD, last EGD 2008.  States that after she had significant weight loss her reflux improved.  She has infrequent symptoms now, usually worse if she drinks tea or coffee after dinner.  Denies shortness of breath, chest pain, cough, sore  throat, dizziness.  States that her normal white blood cell count is 4-5k, and if it is elevated even within normal range it may be abnormal for her.  Previous GI Procedures/Imaging   Colonoscopy 08/08/2022 - Severe diverticulosis  - Tortuous colon.  - The examination was otherwise normal.  - No polyps.  - The GI Genius ( intelligent endoscopy module) , computer- aided polyp detection system powered by AI was utilized to detect colorectal polyps through enhanced visualization during colonoscopy. - No recall recommended  CT A/P 06/26/2022 1. Findings compatible with acute uncomplicated sigmoid diverticulitis. 2. Aortic Atherosclerosis (ICD10-I70.0).  Past Medical History:  Diagnosis Date   Abdominal wall hernia 08/04/2012   Atrial fibrillation (HCC) 2008   REPORTS SHE WAS AT [place], UPON WAKING I FELT MY HEART GOING BOOM BOOM BOOM BEATING OUT OF MY CHEST. I WENT TO HOSPITAL AND THEY ISED MEDICINE TO CONVERT ME , IT TOOK OVER 10 HOURS TO CONVERT ME. BUT AFTER THAT IVE NEVER HAD ANY ISSUES SINCE. I USED TO HAVE A CARDIOLOGUIST AS MY PRIMARY IN [place]  BUT IHAVENT BEEN TO A CARDIOLOGIST SINCE I MOVED DOWN HERE.    Chicken pox as a child   Depression    Depression with anxiety 12/27/2012   Diverticulitis    DJD (degenerative joint disease) 08/05/2012   DVT (deep venous thrombosis) (HCC) 2018   Fifth disease 1989   DX AFTER EXPOSURE FROM HER CHILDREN; DEVELOPED RHEUMATOID ARTHRITIS DURING COURSE OF ILLNESS. REPORTS:   I DONT HAVE IT ANYMORE SINCE THE DISEASE IS GONE    H/O gestational diabetes mellitus, not currently pregnant 08/05/2012   History of    Heart murmur    echo 2012; I HAVE A FUNCTIONAL MURMUR    Hyperlipidemia    Hypothyroidism    Knee effusion, right    while on Eliquis, discontinued Eliquis after DVT resolved   Macular degeneration    Measles as a child   Migraines    Other and unspecified hyperlipidemia 08/05/2012   Paroxysmal A-fib (HCC)    Shingles 43    Spondylisthesis 2012   L5/S1 grade 3   Varicose veins 08/05/2012   Extending up to right groin B/l LE   Ventral hernia 08/05/2012   Wears glasses     Past Surgical History:  Procedure Laterality Date   BREAST DUCTAL SYSTEM EXCISION Right 05/05/2014   Procedure: RIGHT BREAST DUCT EXCISION;  Surgeon: Donnice Bury, MD;  Location: [facility];  Service: General;  Laterality: Right;   COLONOSCOPY     DILATION AND CURETTAGE OF UTERUS     x4   EXCISIONAL TOTAL KNEE ARTHROPLASTY WITH ANTIBIOTIC SPACERS Right 09/14/2020   Procedure: Irrigation and debriement with poly exchange versus resection of right total knee arthroplasty and placement of antibitiotic spacer;  Surgeon: Ernie Donnice, MD;  Location: [facility];  Service: Orthopedics;  Laterality: Right;   MOUTH SURGERY     TOTAL KNEE ARTHROPLASTY Right 07/01/2017   Procedure: RIGHT TOTAL KNEE ARTHROPLASTY;  Surgeon: Ernie Donnice, MD;  Location: [facility];  Service: Orthopedics;  Laterality: Right;  70 mins   VARICOSE VEIN SURGERY  1995   right leg    Current Outpatient Medications  Medication Sig Dispense Refill   aspirin  EC 81 MG tablet Take 81 mg by mouth daily. Swallow whole.     atorvastatin  (LIPITOR) 10 MG tablet TAKE ONE-HALF TABLET BY MOUTH DAILY 45 tablet 1   Cholecalciferol (VITAMIN D3 PO) Take 1 capsule by mouth daily.     Coenzyme Q10 300 MG CAPS Take 300 mg by mouth every evening.     ketorolac  (ACULAR ) 0.5 % ophthalmic solution Place 1 drop into the left eye 2 (two) times daily.     levothyroxine  (SYNTHROID ) 88 MCG tablet Take 1 tablet (88 mcg total) by mouth daily. 30 tablet 3   Multiple Vitamins-Minerals (ICAPS AREDS 2 PO) Take 2 capsules by mouth daily.     polyethylene glycol (MIRALAX  / GLYCOLAX ) 17 g packet Take 17 g by mouth 2 (two) times daily.     prednisoLONE acetate (PRED FORTE) 1 % ophthalmic suspension Place 1 drop into the left eye 2 (two) times daily.     No current facility-administered medications  for this visit.    Allergies as of 01/19/2024 - Review Complete 12/04/2023  Allergen Reaction Noted   Ciprofloxacin  Other (See Comments) 03/29/2021   Crestor [rosuvastatin] Other (See Comments) 05/21/2013   Praluent  [alirocumab ] Other (See Comments) 04/30/2023   Pravachol [pravastatin] Other (See Comments) 05/21/2013   Cefazolin  Rash 10/22/2020   Rifampin  Rash 03/29/2021    Family History  Problem Relation Age of Onset   Cancer Mother        lung- smoker   Liver disease Mother    Cancer Father        lung- smoker   Peripheral Artery Disease Sister    Heart attack Paternal Uncle    Dementia Maternal Grandmother    Heart attack Maternal Grandfather    Asthma Son    Ulcerative colitis Son    Asthma Son    Ulcerative colitis Son    Pancreatic cancer Other    Stomach cancer Other    Colon cancer Neg Hx    Cystic fibrosis Neg Hx    Esophageal cancer Neg Hx     Social History   Tobacco Use   Smoking status: Former    Types: Cigarettes   Smokeless tobacco: Never  Vaping Use   Vaping status: Never Used  Substance Use Topics   Alcohol use: No   Drug use: No     Review of Systems:    Constitutional: Unintentional weight loss of about 4 pounds, chills, fatigue.  No fever Skin: No rash or itching Cardiovascular: No chest pain Respiratory: No SOB or cough Gastrointestinal: See HPI and otherwise negative Neurological: Headache relieved by Tylenol , no dizziness    Physical Exam:  Vital signs: BP 120/68 (BP Location: Left Arm, Patient Position: Sitting, Cuff Size: Normal)   Pulse 60   Ht 5' 3 (1.6 m) Comment: height measured without shoes  Wt 124 lb 6 oz (56.4 kg) Comment: pt refused-stated weight  BMI 22.03 kg/m   Temp 98 F  Wt Readings from Last 3 Encounters:  01/19/24 124 lb 6 oz (56.4 kg)  11/26/23 130 lb (59 kg)  11/05/23 130 lb (59 kg)   Constitutional: Pleasant, thin female in NAD, alert and cooperative Head:  Normocephalic and atraumatic.  Eyes: No  scleral icterus.  Mouth: No oral lesions. Respiratory: Respirations even and unlabored. Lungs clear to auscultation bilaterally.  No wheezes, crackles,  or rhonchi.  Cardiovascular:  Regular rate and rhythm. No murmurs. No peripheral edema. Gastrointestinal:  Soft, nondistended, nontender. No rebound or guarding.  Increased bowel sounds.  No high-pitched bowel sounds.  No appreciable masses or hepatomegaly. Rectal:  Not performed.  Neurologic:  Alert and oriented x4;  grossly normal neurologically.  Skin:   Dry and intact without significant lesions or rashes. Psychiatric: Oriented to person, place and time. Demonstrates good judgement and reason without abnormal affect or behaviors.   RELEVANT LABS AND IMAGING: CBC    Component Value Date/Time   WBC 5.1 11/26/2023 1631   RBC 4.26 11/26/2023 1631   HGB 12.4 11/26/2023 1631   HCT 37.0 11/26/2023 1631   PLT 267.0 11/26/2023 1631   MCV 86.8 11/26/2023 1631   MCH 29.3 10/14/2022 1047   MCHC 33.6 11/26/2023 1631   RDW 12.5 11/26/2023 1631   LYMPHSABS 1.3 06/02/2023 1022   MONOABS 0.4 06/02/2023 1022   EOSABS 0.1 06/02/2023 1022   BASOSABS 0.0 06/02/2023 1022    CMP     Component Value Date/Time   NA 134 (L) 11/25/2023 0742   NA 138 10/02/2020 0000   K 4.3 11/25/2023 0742   CL 96 11/25/2023 0742   CO2 31 11/25/2023 0742   GLUCOSE 90 11/25/2023 0742   BUN 18 11/25/2023 0742   BUN 11 10/02/2020 0000   CREATININE 0.65 11/25/2023 0742   CREATININE 0.58 (L) 11/05/2023 1110   CALCIUM  9.2 11/25/2023 0742   PROT 6.7 11/25/2023 0742   ALBUMIN 4.2 11/25/2023 0742   AST 28 11/25/2023 0742   ALT 18 11/25/2023 0742   ALKPHOS 62 11/25/2023 0742   BILITOT 0.9 11/25/2023 0742   GFRNONAA >60 11/27/2020 1328   GFRAA >60 09/19/2019 1518     Assessment/Plan:   History of diverticulitis Abdominal discomfort Decreased appetite Loss of weight Nausea without vomiting Chills without fever Fatigue Abdominal bloating Headache Patient  called today with concern for possible diverticulitis flare and was scheduled for same-day appointment.  History of diverticulitis, last treated 02/2023 with a week of Augmentin  twice daily. Her current symptoms are somewhat atypical of what she usually experiences with diverticulitis.  States that she has no abdominal pain, fever, or change in bowel habits.  Has had nausea, increased gas and belching, headaches, and chills.  Abdominal bloating is relieved by passing gas.  Symptoms began 3 to 4 weeks ago, and she put herself on a liquid diet about 1 week ago.  She does feel better on a liquid diet but symptoms persist.  Denies any blood in stool or melena.  Has checked her temperature at home and denies fever.  Headache is relieved by Tylenol .  Since starting liquid diet has lost about 4 pounds.  Even on liquid diet continues to have nausea.  Has Zofran  at home which she has not been taking.  Vitals are normal in office today.  On exam she does have increased bowel sounds but otherwise unremarkable and no tenderness to palpation.  Given her history of diverticulitis and known severe diverticulosis seen on colonoscopy 08/2022, in setting of ongoing symptoms will order stat labs and imaging of the abdomen to determine if she has acute diverticulitis.  If so will treat with Augmentin  which she has responded to in the past.  - Order stat CT abdomen and pelvis - Labs today: CBC, CMP, CRP, ESR  - Advised to try Zofran  which she already has at home - Increase fluid intake - ER precautions given for worsening  symptoms, inability to maintain oral hydration - If confirmed diverticulitis will plan to treat with course of Augmentin  - If no explanation for symptoms on CT, consider endoscopic evaluation - Follow-up 4 weeks   Camie Furbish, PA-C [facility] 01/19/2024, 2:14 PM  Patient Care Team: Domenica Harlene LABOR, MD as PCP - General (Family Medicine) Santo Stanly LABOR, MD as PCP - Cardiology  (Cardiology) Lavonia Lye, MD as Consulting Physician (Ophthalmology) [place], [facility])

## 2024-01-19 NOTE — Patient Instructions (Signed)
 _______________________________________________________  If your blood pressure at your visit was 140/90 or greater, please contact your primary care physician to follow up on this.  _______________________________________________________  If you are age 73 or older, your body mass index should be between 23-30. Your Body mass index is 22.03 kg/m. If this is out of the aforementioned range listed, please consider follow up with your Primary Care Provider.  If you are age 53 or younger, your body mass index should be between 19-25. Your Body mass index is 22.03 kg/m. If this is out of the aformentioned range listed, please consider follow up with your Primary Care Provider.   ________________________________________________________  The [facility] providers would like to encourage you to use MYCHART to communicate with providers for non-urgent requests or questions.  Due to long hold times on the telephone, sending your provider a message by [facility] may be a faster and more efficient way to get a response.  Please allow 48 business hours for a response.  Please remember that this is for non-urgent requests.  _______________________________________________________  [facility] is using a team-based approach to care.  Your team is made up of your doctor and two to three APPS. Our APPS (Nurse Practitioners and Physician Assistants) work with your physician to ensure care continuity for you. They are fully qualified to address your health concerns and develop a treatment plan. They communicate directly with your gastroenterologist to care for you. Seeing the Advanced Practice Practitioners on your physician's team can help you by facilitating care more promptly, often allowing for earlier appointments, access to diagnostic testing, procedures, and other specialty referrals.   Your provider has requested that you go to the basement level for lab work before leaving today. Press B on the  elevator. The lab is located at the first door on the left as you exit the elevator.  Use Zofran  as needed for nausea  INCREASE fluid inkate.   Please proceed to ER for fever, abdominal pain, vomiting, weakness, etc.  You have been scheduled for a CT scan of the abdomen and pelvis at [facility], 1st floor Radiology. You are scheduled on 01-19-24 at 6pm. You should arrive at 3:45pm for registration and to drink contrast.    You may take any medications as prescribed with a small amount of water , if necessary. If you take any of the following medications: METFORMIN, GLUCOPHAGE, GLUCOVANCE, AVANDAMET, RIOMET, FORTAMET, ACTOPLUS MET, JANUMET, GLUMETZA or METAGLIP, you MAY be asked to HOLD this medication 48 hours AFTER the exam.   The purpose of you drinking the oral contrast is to aid in the visualization of your intestinal tract. The contrast solution may cause some diarrhea. Depending on your individual set of symptoms, you may also receive an intravenous injection of x-ray contrast/dye. Plan on being at [facility] for 45 minutes or longer, depending on the type of exam you are having performed.   If you have any questions regarding your exam or if you need to reschedule, you may call [facility] at [phone] between the hours of 8:00 am and 5:00 pm, Monday-Friday.   Due to recent changes in healthcare laws, you may see the results of your imaging and laboratory studies on MyChart before your provider has had a chance to review them.  We understand that in some cases there may be results that are confusing or concerning to you. Not all laboratory results come back in the same time frame and the provider may be waiting for multiple  results in order to interpret others.  Please give us  48 hours in order for your provider to thoroughly review all the results before contacting the office for clarification of your results.   Thank you for entrusting me with your care and choosing  [facility].  Camie Furbish, PA-C

## 2024-01-19 NOTE — Progress Notes (Deleted)
 [facility] [address], [address] [place], [place] [address] [phone]  Date:  01/19/2024   Name:  Crystal Russell   DOB:  21-Jul-1950   MRN:  [id number]  PCP:  Domenica Harlene LABOR, MD    Chief Complaint: No chief complaint on file.   History of Present Illness:  Crystal Russell is a 73 y.o. very pleasant female patient who presents with the following:  Pt seen today with concern of nausea Primacy patient of my partner Dr Domenica - I did see her in August for some visual symptoms She saw her eye doctor and brain MRI was normal  She has history of hypothyroidism, paroxysmal atrial fibrillation PFO/mitral regurg/aortic insufficiency artificial joint infection    Discussed the use of AI scribe software for clinical note transcription with the patient, who gave verbal consent to proceed.  History of Present Illness     Patient Active Problem List   Diagnosis Date Noted   Hyponatremia 10/11/2021   PSVT (paroxysmal supraventricular tachycardia) 05/08/2021   Aortic atherosclerosis 05/08/2021   Infection of prosthetic right knee joint 09/14/2020   PAC (premature atrial contraction) 07/28/2020   Nonrheumatic aortic valve insufficiency 05/01/2020   Mild mitral regurgitation 05/01/2020   PFO (patent foramen ovale) 05/01/2020   Diverticulitis 03/16/2020   Hyperglycemia 03/16/2020   Vertigo 09/14/2019   Postmenopausal bleeding 12/06/2015   Varicose veins of bilateral lower extremities with other complications 09/11/2015   Sun-damaged skin 12/04/2014   Breast mass, left 05/16/2014   Hypothyroidism 08/08/2013   Atrophic vaginitis 08/08/2013   Depression with anxiety 12/27/2012   Ventral hernia 08/05/2012   DJD (degenerative joint disease) 08/05/2012   Constipation 08/05/2012   Hyperlipidemia, mixed 08/05/2012   H/O gestational diabetes mellitus, not currently pregnant 08/05/2012   Chicken pox    Shingles    Paroxysmal A-fib  (HCC)    Spondylolisthesis     Past Medical History:  Diagnosis Date   Abdominal wall hernia 08/04/2012   Atrial fibrillation (HCC) 2008   REPORTS SHE WAS AT [place], UPON WAKING I FELT MY HEART GOING BOOM BOOM BOOM BEATING OUT OF MY CHEST. I WENT TO HOSPITAL AND THEY ISED MEDICINE TO CONVERT ME , IT TOOK OVER 10 HOURS TO CONVERT ME. BUT AFTER THAT IVE NEVER HAD ANY ISSUES SINCE. I USED TO HAVE A CARDIOLOGUIST AS MY PRIMARY IN [place]  BUT IHAVENT BEEN TO A CARDIOLOGIST SINCE I MOVED DOWN HERE.    Chicken pox as a child   Depression    Depression with anxiety 12/27/2012   Diverticulitis    DJD (degenerative joint disease) 08/05/2012   DVT (deep venous thrombosis) (HCC) 2018   Fifth disease 1989   DX AFTER EXPOSURE FROM HER CHILDREN; DEVELOPED RHEUMATOID ARTHRITIS DURING COURSE OF ILLNESS. REPORTS:   I DONT HAVE IT ANYMORE SINCE THE DISEASE IS GONE    H/O gestational diabetes mellitus, not currently pregnant 08/05/2012   History of    Heart murmur    echo 2012; I HAVE A FUNCTIONAL MURMUR    Hyperlipidemia    Hypothyroidism    Knee effusion, right    while on Eliquis, discontinued Eliquis after DVT resolved   Measles as a child   Migraines    Other and unspecified hyperlipidemia 08/05/2012   Paroxysmal A-fib (HCC)    Shingles 43   Spondylisthesis 2012   L5/S1 grade 3   Varicose veins 08/05/2012   Extending up to right groin B/l  LE   Ventral hernia 08/05/2012   Wears glasses     Past Surgical History:  Procedure Laterality Date   BREAST DUCTAL SYSTEM EXCISION Right 05/05/2014   Procedure: RIGHT BREAST DUCT EXCISION;  Surgeon: Donnice Bury, MD;  Location: [facility];  Service: General;  Laterality: Right;   COLONOSCOPY     DILATION AND CURETTAGE OF UTERUS     x4   EXCISIONAL TOTAL KNEE ARTHROPLASTY WITH ANTIBIOTIC SPACERS Right 09/14/2020   Procedure: Irrigation and debriement with poly exchange versus resection of right total knee arthroplasty and placement of  antibitiotic spacer;  Surgeon: Ernie Donnice, MD;  Location: [facility];  Service: Orthopedics;  Laterality: Right;   MOUTH SURGERY     TOTAL KNEE ARTHROPLASTY Right 07/01/2017   Procedure: RIGHT TOTAL KNEE ARTHROPLASTY;  Surgeon: Ernie Donnice, MD;  Location: [facility];  Service: Orthopedics;  Laterality: Right;  70 mins   VARICOSE VEIN SURGERY  1995   right leg    Social History   Tobacco Use   Smoking status: Former    Types: Cigarettes   Smokeless tobacco: Never  Vaping Use   Vaping status: Never Used  Substance Use Topics   Alcohol use: No   Drug use: No    Family History  Problem Relation Age of Onset   Cancer Mother        lung- smoker   Liver disease Mother    Cancer Father        lung- smoker   Peripheral Artery Disease Sister    Heart attack Paternal Uncle    Dementia Maternal Grandmother    Heart attack Maternal Grandfather    Asthma Son    Ulcerative colitis Son    Asthma Son    Ulcerative colitis Son    Pancreatic cancer Other    Stomach cancer Other    Colon cancer Neg Hx    Cystic fibrosis Neg Hx    Esophageal cancer Neg Hx     Allergies  Allergen Reactions   Ciprofloxacin  Other (See Comments)    Abdominal pain-06/17/22-patient states she is not allergic, no rash or swelling    Crestor [Rosuvastatin] Other (See Comments)    Joint pain w/fibromyalgia   Praluent  [Alirocumab ] Other (See Comments)    Bruising and pain at injection site   Pravachol [Pravastatin] Other (See Comments)    Joint pain w/fibromyalgia   Cefazolin  Rash   Rifampin  Rash    Medication list has been reviewed and updated.  Current Outpatient Medications on File Prior to Visit  Medication Sig Dispense Refill   aspirin  EC 81 MG tablet Take 81 mg by mouth daily. Swallow whole.     atorvastatin  (LIPITOR) 10 MG tablet TAKE ONE-HALF TABLET BY MOUTH DAILY 45 tablet 1   Coenzyme Q10 300 MG CAPS Take 300 mg by mouth every evening.     levothyroxine  (SYNTHROID ) 88 MCG tablet Take 1 tablet  (88 mcg total) by mouth daily. 30 tablet 3   polyethylene glycol (MIRALAX  / GLYCOLAX ) 17 g packet Take 17 g by mouth 2 (two) times daily.     No current facility-administered medications on file prior to visit.    Review of Systems:  As per HPI- otherwise negative.   Physical Examination: There were no vitals filed for this visit. There were no vitals filed for this visit. There is no height or weight on file to calculate BMI. Ideal Body Weight:    GEN: no acute distress. HEENT: Atraumatic, Normocephalic.  Ears  and Nose: No external deformity. CV: RRR, No M/G/R. No JVD. No thrill. No extra heart sounds. PULM: CTA B, no wheezes, crackles, rhonchi. No retractions. No resp. distress. No accessory muscle use. ABD: S, NT, ND, +BS. No rebound. No HSM. EXTR: No c/c/e PSYCH: Normally interactive. Conversant.    Assessment and Plan: No diagnosis found.  Assessment & Plan   Signed Harlene Schroeder, MD

## 2024-01-21 ENCOUNTER — Encounter: Payer: Self-pay | Admitting: Family Medicine

## 2024-01-21 ENCOUNTER — Other Ambulatory Visit: Payer: Self-pay

## 2024-01-21 MED ORDER — LEVOTHYROXINE SODIUM 88 MCG PO TABS: 88.0000 ug | ORAL_TABLET | Freq: Every day | ORAL | 2 refills | Status: AC

## 2024-01-22 DIAGNOSIS — L814 Other melanin hyperpigmentation: Secondary | ICD-10-CM | POA: Diagnosis not present

## 2024-01-22 DIAGNOSIS — D225 Melanocytic nevi of trunk: Secondary | ICD-10-CM | POA: Diagnosis not present

## 2024-01-22 DIAGNOSIS — L570 Actinic keratosis: Secondary | ICD-10-CM | POA: Diagnosis not present

## 2024-01-22 DIAGNOSIS — L821 Other seborrheic keratosis: Secondary | ICD-10-CM | POA: Diagnosis not present

## 2024-01-22 DIAGNOSIS — L578 Other skin changes due to chronic exposure to nonionizing radiation: Secondary | ICD-10-CM | POA: Diagnosis not present

## 2024-01-25 DIAGNOSIS — Z23 Encounter for immunization: Secondary | ICD-10-CM | POA: Diagnosis not present

## 2024-02-02 ENCOUNTER — Other Ambulatory Visit: Payer: Self-pay

## 2024-02-02 DIAGNOSIS — I872 Venous insufficiency (chronic) (peripheral): Secondary | ICD-10-CM

## 2024-02-18 ENCOUNTER — Encounter: Payer: Self-pay | Admitting: Internal Medicine

## 2024-02-18 ENCOUNTER — Encounter: Payer: Self-pay | Admitting: Family Medicine

## 2024-03-01 NOTE — Progress Notes (Unsigned)
 Patient name: Crystal Russell MRN: [id number] DOB: 08/17/1950 Sex: female  REASON FOR CONSULT: Varicose veins of bilateral lower extremities  HPI: Chenell Lozon is a 73 y.o. female, with history of hyperlipidemia that presents for evaluation of varicose veins of her bilateral lower extremities.  Patient is mainly here for a checkup according to her.  States she had a right great saphenous vein stripped about 30 years ago in [place].  Has noticed more varicosities on the left leg.  States she really does not want any more invasive procedures.  Wears thigh-high 20 to 30 mm compression stockings daily.  States her leg does swell if she does not wear compression stockings but she is very compliant with these on a daily basis as soon as she wakes up.  Walks 2 to 3 miles a day.  Left leg doesn't bother her much.  Is worried that her venous disease could be contributing to her heart rate being low in the 40s and sometimes high in the 160s when exercising.  Past Medical History:  Diagnosis Date   Abdominal wall hernia 08/04/2012   Atrial fibrillation (HCC) 2008   REPORTS SHE WAS AT [place], UPON WAKING I FELT MY HEART GOING BOOM BOOM BOOM BEATING OUT OF MY CHEST. I WENT TO HOSPITAL AND THEY ISED MEDICINE TO CONVERT ME , IT TOOK OVER 10 HOURS TO CONVERT ME. BUT AFTER THAT IVE NEVER HAD ANY ISSUES SINCE. I USED TO HAVE A CARDIOLOGUIST AS MY PRIMARY IN [place]  BUT IHAVENT BEEN TO A CARDIOLOGIST SINCE I MOVED DOWN HERE.    Chicken pox as a child   Depression    Depression with anxiety 12/27/2012   Diverticulitis    DJD (degenerative joint disease) 08/05/2012   DVT (deep venous thrombosis) (HCC) 2018   Fifth disease 1989   DX AFTER EXPOSURE FROM HER CHILDREN; DEVELOPED RHEUMATOID ARTHRITIS DURING COURSE OF ILLNESS. REPORTS:   I DONT HAVE IT ANYMORE SINCE THE DISEASE IS GONE    H/O gestational diabetes mellitus, not currently pregnant 08/05/2012   History of    Heart murmur    echo 2012; I HAVE A FUNCTIONAL  MURMUR    Hyperlipidemia    Hypothyroidism    Knee effusion, right    while on Eliquis, discontinued Eliquis after DVT resolved   Macular degeneration    Measles as a child   Migraines    Other and unspecified hyperlipidemia 08/05/2012   Paroxysmal A-fib (HCC)    Shingles 43   Spondylisthesis 2012   L5/S1 grade 3   Varicose veins 08/05/2012   Extending up to right groin B/l LE   Ventral hernia 08/05/2012   Wears glasses     Past Surgical History:  Procedure Laterality Date   BREAST DUCTAL SYSTEM EXCISION Right 05/05/2014   Procedure: RIGHT BREAST DUCT EXCISION;  Surgeon: Donnice Bury, MD;  Location: [facility];  Service: General;  Laterality: Right;   COLONOSCOPY     DILATION AND CURETTAGE OF UTERUS     x4   EXCISIONAL TOTAL KNEE ARTHROPLASTY WITH ANTIBIOTIC SPACERS Right 09/14/2020   Procedure: Irrigation and debriement with poly exchange versus resection of right total knee arthroplasty and placement of antibitiotic spacer;  Surgeon: Ernie Donnice, MD;  Location: [facility];  Service: Orthopedics;  Laterality: Right;   MOUTH SURGERY     TOTAL KNEE ARTHROPLASTY Right 07/01/2017   Procedure: RIGHT TOTAL KNEE ARTHROPLASTY;  Surgeon: Ernie Donnice, MD;  Location: [facility];  Service: Orthopedics;  Laterality:  Right;  70 mins   VARICOSE VEIN SURGERY  1995   right leg    Family History  Problem Relation Age of Onset   Cancer Mother        lung- smoker   Liver disease Mother    Cancer Father        lung- smoker   Peripheral Artery Disease Sister    Heart attack Paternal Uncle    Dementia Maternal Grandmother    Heart attack Maternal Grandfather    Asthma Son    Ulcerative colitis Son    Asthma Son    Ulcerative colitis Son    Pancreatic cancer Other    Stomach cancer Other    Colon cancer Neg Hx    Cystic fibrosis Neg Hx    Esophageal cancer Neg Hx     SOCIAL HISTORY: Social History   Socioeconomic History   Marital status: Married    Spouse name:  Not on file   Number of children: Not on file   Years of education: Not on file   Highest education level: Associate degree: occupational, scientist, product/process development, or vocational program  Occupational History   Not on file  Tobacco Use   Smoking status: Former    Types: Cigarettes   Smokeless tobacco: Never  Vaping Use   Vaping status: Never Used  Substance and Sexual Activity   Alcohol use: No   Drug use: No   Sexual activity: Yes  Other Topics Concern   Not on file  Social History Narrative   Not on file   Social Drivers of Health   Financial Resource Strain: Low Risk  (11/25/2023)   Overall Financial Resource Strain (CARDIA)    Difficulty of Paying Living Expenses: Not hard at all  Food Insecurity: No Food Insecurity (11/25/2023)   Hunger Vital Sign    Worried About Running Out of Food in the Last Year: Never true    Ran Out of Food in the Last Year: Never true  Transportation Needs: No Transportation Needs (11/25/2023)   PRAPARE - Administrator, Civil Service (Medical): No    Lack of Transportation (Non-Medical): No  Physical Activity: Sufficiently Active (11/25/2023)   Exercise Vital Sign    Days of Exercise per Week: 5 days    Minutes of Exercise per Session: 40 min  Stress: No Stress Concern Present (11/25/2023)   [employer] of Occupational Health - Occupational Stress Questionnaire    Feeling of Stress: Not at all  Social Connections: Socially Integrated (11/25/2023)   Social Connection and Isolation Panel    Frequency of Communication with Friends and Family: More than three times a week    Frequency of Social Gatherings with Friends and Family: Once a week    Attends Religious Services: More than 4 times per year    Active Member of [employer] or Organizations: Yes    Attends Banker Meetings: 1 to 4 times per year    Marital Status: Married  Catering Manager Violence: Not on file    Allergies  Allergen Reactions   Ciprofloxacin  Other (See  Comments)    Abdominal pain-06/17/22-patient states she is not allergic, no rash or swelling    Crestor [Rosuvastatin] Other (See Comments)    Joint pain w/fibromyalgia   Praluent  [Alirocumab ] Other (See Comments)    Bruising and pain at injection site   Pravachol [Pravastatin] Other (See Comments)    Joint pain w/fibromyalgia   Cefazolin  Rash   Rifampin  Rash  Current Outpatient Medications  Medication Sig Dispense Refill   aspirin  EC 81 MG tablet Take 81 mg by mouth daily. Swallow whole.     atorvastatin  (LIPITOR) 10 MG tablet TAKE ONE-HALF TABLET BY MOUTH DAILY 45 tablet 1   Cholecalciferol (VITAMIN D3 PO) Take 1 capsule by mouth daily.     Coenzyme Q10 300 MG CAPS Take 300 mg by mouth every evening.     ketorolac  (ACULAR ) 0.5 % ophthalmic solution Place 1 drop into the left eye 2 (two) times daily.     levothyroxine  (SYNTHROID ) 88 MCG tablet Take 1 tablet (88 mcg total) by mouth daily. 90 tablet 2   Multiple Vitamins-Minerals (ICAPS AREDS 2 PO) Take 2 capsules by mouth daily.     polyethylene glycol (MIRALAX  / GLYCOLAX ) 17 g packet Take 17 g by mouth 2 (two) times daily.     prednisoLONE acetate (PRED FORTE) 1 % ophthalmic suspension Place 1 drop into the left eye 2 (two) times daily.     No current facility-administered medications for this visit.    REVIEW OF SYSTEMS:  [X]  denotes positive finding, [ ]  denotes negative finding Cardiac  Comments:  Chest pain or chest pressure:    Shortness of breath upon exertion:    Short of breath when lying flat:    Irregular heart rhythm:        Vascular    Pain in calf, thigh, or hip brought on by ambulation:    Pain in feet at night that wakes you up from your sleep:     Blood clot in your veins:    Leg swelling:  x       Pulmonary    Oxygen at home:    Productive cough:     Wheezing:         Neurologic    Sudden weakness in arms or legs:     Sudden numbness in arms or legs:     Sudden onset of difficulty speaking or  slurred speech:    Temporary loss of vision in one eye:     Problems with dizziness:         Gastrointestinal    Blood in stool:     Vomited blood:         Genitourinary    Burning when urinating:     Blood in urine:        Psychiatric    Major depression:         Hematologic    Bleeding problems:    Problems with blood clotting too easily:        Skin    Rashes or ulcers:        Constitutional    Fever or chills:      PHYSICAL EXAM: There were no vitals filed for this visit.  GENERAL: The patient is a well-nourished female, in no acute distress. The vital signs are documented above. CARDIAC: There is a regular rate and rhythm.  VASCULAR:  Bilateral femoral pulses palpable Bilateral DP pulses palpable Notable large left lower extremity varicosities without any skin changes or venous stasis ulcer PULMONARY: No respiratory distress. ABDOMEN: Soft and non-tender. MUSCULOSKELETAL: There are no major deformities or cyanosis. NEUROLOGIC: No focal weakness or paresthesias are detected. SKIN: There are no ulcers or rashes noted. PSYCHIATRIC: The patient has a normal affect.  DATA:   Lower Venous Reflux Study   Patient Name:  Aundraya Dripps  Date of Exam:   03/02/2024  Medical Rec #: [id number]  Accession #:    [id number]  Date of Birth: 11-Sep-1950     Patient Gender: F  Patient Age:   67 years  Exam Location:  [address]  Procedure:      VAS US  LOWER EXTREMITY VENOUS REFLUX  Referring Phys: LONNI GASKINS    ---------------------------------------------------------------------------  -----    Other Indications: Patient presents with varicose veins, prominent in the  left                    lower extremty. She has a history of stripping of the  right                    GSV decades ago.   Performing Technologist: Edsel Mustard RVT     Examination Guidelines: A complete evaluation includes B-mode imaging,  spectral  Doppler, color Doppler, and power  Doppler as needed of all accessible  portions  of each vessel. Bilateral testing is considered an integral part of a  complete  examination. Limited examinations for reoccurring indications may be  performed  as noted. The reflux portion of the exam is performed with the patient in  reverse Trendelenburg.  Significant venous reflux is defined as >500 ms in the superficial venous  system, and >1 second in the deep venous system.     +--------------+---------+------+-----------+------------+--------+  LEFT         Reflux NoRefluxReflux TimeDiameter cmsComments                          Yes                                   +--------------+---------+------+-----------+------------+--------+  CFV                    yes   >1 second                       +--------------+---------+------+-----------+------------+--------+  FV mid                  yes   >1 second                       +--------------+---------+------+-----------+------------+--------+  Popliteal    no                                              +--------------+---------+------+-----------+------------+--------+  GSV at SFJ              yes    >500 ms      1.12              +--------------+---------+------+-----------+------------+--------+  GSV prox thigh          yes    >500 ms      0.93              +--------------+---------+------+-----------+------------+--------+  GSV mid thigh           yes    >500 ms      1.24              +--------------+---------+------+-----------+------------+--------+  GSV dist thigh          yes    >500 ms  1.34              +--------------+---------+------+-----------+------------+--------+  GSV at knee             yes    >500 ms      0.77              +--------------+---------+------+-----------+------------+--------+  GSV prox calf           yes    >500 ms      0.98               +--------------+---------+------+-----------+------------+--------+  GSV mid calf            yes    >500 ms      0.53              +--------------+---------+------+-----------+------------+--------+  GSV dist calf no                            0.23              +--------------+---------+------+-----------+------------+--------+  SSV at [facility]    no                            0.22              +--------------+---------+------+-----------+------------+--------+      Summary:  Left:  - No evidence of deep vein thrombosis seen in the left lower extremity,  from the common femoral through the popliteal veins.  - No evidence of superficial venous thrombosis in the left lower  extremity.  - Venous reflux is noted in the left common femoral vein.  - Venous reflux is noted in the left sapheno-femoral junction.  - Venous reflux is noted in the left greater saphenous vein in the thigh.  - Venous reflux is noted in the left greater saphenous vein in the calf.  - Venous reflux is noted in the left femoral vein.    *See table(s) above for measurements and observations.   Electronically signed by Lonni Gaskins MD on 03/02/2024 at 12:00:24  PM.     Final     Assessment/Plan:  73 y.o. female, with history hyperlipidemia that presents for evaluation of varicose veins of her bilateral lower extremities.  Discussed etiology of chronic venous insufficiency with valvular reflux.  I discussed conservative measures including leg elevation, exercise, compression stockings, maintaining healthy weight.  She is already very compliant with wearing 20 to 30 mmHg thigh-high stockings on a daily basis.  I discussed her reflux study shows no evidence of DVT in the left leg.  She does have significant reflux particularly in the great saphenous vein.  Discussed she potentially would be a candidate for left great saphenous vein ablation with likely stab phlebectomies.  Ultimately she is not  interested in any further invasive procedures and would like to pursue conservative measures which I discussed is totally appropriate.  Has had a prior stripping in the right leg about 30 years ago in [place] .  Her venous disease should have no impact on her heart rate which we discussed today.  Can maintain her activity level.  Has a good pedal pulse and no signs of arterial insufficiency.   Lonni DOROTHA Gaskins, MD [facility] Office: [phone]

## 2024-03-02 ENCOUNTER — Encounter: Payer: Self-pay | Admitting: Vascular Surgery

## 2024-03-02 ENCOUNTER — Ambulatory Visit (HOSPITAL_COMMUNITY)
Admission: RE | Admit: 2024-03-02 | Discharge: 2024-03-02 | Disposition: A | Discharge status: Home / Self Care | Source: Ambulatory Visit | Attending: Vascular Surgery | Admitting: Vascular Surgery

## 2024-03-02 ENCOUNTER — Ambulatory Visit (INDEPENDENT_AMBULATORY_CARE_PROVIDER_SITE_OTHER): Admitting: Vascular Surgery

## 2024-03-02 VITALS — BP 128/79 | HR 48 | Temp 97.9°F | Resp 18 | Ht 63.0 in

## 2024-03-02 DIAGNOSIS — I83893 Varicose veins of bilateral lower extremities with other complications: Secondary | ICD-10-CM | POA: Insufficient documentation

## 2024-03-02 DIAGNOSIS — I872 Venous insufficiency (chronic) (peripheral): Secondary | ICD-10-CM | POA: Insufficient documentation

## 2024-03-08 ENCOUNTER — Ambulatory Visit: Admitting: Internal Medicine

## 2024-03-08 ENCOUNTER — Other Ambulatory Visit: Payer: Self-pay

## 2024-03-08 ENCOUNTER — Encounter: Payer: Self-pay | Admitting: Internal Medicine

## 2024-03-08 VITALS — BP 119/75 | HR 51 | Temp 97.6°F | Ht 63.0 in | Wt 123.0 lb

## 2024-03-08 DIAGNOSIS — T8450XS Infection and inflammatory reaction due to unspecified internal joint prosthesis, sequela: Secondary | ICD-10-CM

## 2024-03-08 DIAGNOSIS — B9689 Other specified bacterial agents as the cause of diseases classified elsewhere: Secondary | ICD-10-CM | POA: Diagnosis not present

## 2024-03-08 DIAGNOSIS — T8453XD Infection and inflammatory reaction due to internal right knee prosthesis, subsequent encounter: Secondary | ICD-10-CM | POA: Diagnosis not present

## 2024-03-08 NOTE — Progress Notes (Signed)
 RFV: follow up for remote chronic right knee pji, Patient ID: Crystal Russell, female   DOB: [birth year], 73 y.o.   MRN: [id number]  HPI Crystal Russell is a 73yo F with hx of chronic right knee pji, Remains off of abtx. Last seen in July. She has been in good health with exception to early November, syncopal episode while donating blood. She has not have further issues since then but mild fatigue  Lab Results  Component Value Date   ESRSEDRATE 8 01/19/2024   Lab Results  Component Value Date   CRP <0.5 01/19/2024     Outpatient Encounter Medications as of 03/08/2024  Medication Sig   aspirin  EC 81 MG tablet Take 81 mg by mouth daily. Swallow whole.   atorvastatin  (LIPITOR) 10 MG tablet TAKE ONE-HALF TABLET BY MOUTH DAILY   Cholecalciferol (VITAMIN D3 PO) Take 1 capsule by mouth daily.   Coenzyme Q10 300 MG CAPS Take 300 mg by mouth every evening.   levothyroxine  (SYNTHROID ) 88 MCG tablet Take 1 tablet (88 mcg total) by mouth daily.   Multiple Vitamins-Minerals (ICAPS AREDS 2 PO) Take 2 capsules by mouth daily.   polyethylene glycol (MIRALAX  / GLYCOLAX ) 17 g packet Take 17 g by mouth 2 (two) times daily.   prednisoLONE acetate (PRED FORTE) 1 % ophthalmic suspension Place 1 drop into the left eye 2 (two) times daily.   No facility-administered encounter medications on file as of 03/08/2024.     Patient Active Problem List   Diagnosis Date Noted   Chronic venous insufficiency 03/02/2024   Hyponatremia 10/11/2021   PSVT (paroxysmal supraventricular tachycardia) 05/08/2021   Aortic atherosclerosis 05/08/2021   Infection of prosthetic right knee joint 09/14/2020   PAC (premature atrial contraction) 07/28/2020   Nonrheumatic aortic valve insufficiency 05/01/2020   Mild mitral regurgitation 05/01/2020   PFO (patent foramen ovale) 05/01/2020   Diverticulitis 03/16/2020   Hyperglycemia 03/16/2020   Vertigo 09/14/2019   Postmenopausal bleeding 12/06/2015   Varicose veins of bilateral lower  extremities with other complications 09/11/2015   Sun-damaged skin 12/04/2014   Breast mass, left 05/16/2014   Hypothyroidism 08/08/2013   Atrophic vaginitis 08/08/2013   Depression with anxiety 12/27/2012   Ventral hernia 08/05/2012   DJD (degenerative joint disease) 08/05/2012   Constipation 08/05/2012   Hyperlipidemia, mixed 08/05/2012   H/O gestational diabetes mellitus, not currently pregnant 08/05/2012   Chicken pox    Shingles    Paroxysmal A-fib (HCC)    Spondylolisthesis      Health Maintenance Due  Topic Date Due   Medicare Annual Wellness (AWV)  Never done   Influenza Vaccine  11/07/2023   COVID-19 Vaccine (5 - 2025-26 season) 12/08/2023     Review of Systems 12 point ros is otherwise negative Physical Exam   BP 119/75   Pulse (!) 51   Temp 97.6 F (36.4 C) (Temporal)   Ht 5' 3 (1.6 m)   Wt 123 lb (55.8 kg)   SpO2 99%   BMI 21.79 kg/m    Physical Exam  Constitutional:  oriented to person, place, and time. appears well-developed and well-nourished. No distress.  HENT: [place]/AT, PERRLA, no scleral icterus Mouth/Throat: Oropharynx is clear and moist. No oropharyngeal exudate.  Neurological: alert and oriented to person, place, and time.  Skin: Skin is warm and dry. No rash noted. No erythema.  Psychiatric: a normal mood and affect.  behavior is normal.    CBC Lab Results  Component Value Date   WBC 5.2 01/19/2024  RBC 4.40 01/19/2024   HGB 12.7 01/19/2024   HCT 38.0 01/19/2024   PLT 243.0 01/19/2024   MCV 86.5 01/19/2024   MCH 29.3 10/14/2022   MCHC 33.5 01/19/2024   RDW 12.9 01/19/2024   LYMPHSABS 1.4 01/19/2024   MONOABS 0.4 01/19/2024   EOSABS 0.0 01/19/2024    BMET Lab Results  Component Value Date   NA 130 (L) 01/19/2024   K 4.4 01/19/2024   CL 94 (L) 01/19/2024   CO2 28 01/19/2024   GLUCOSE 97 01/19/2024   BUN 8 01/19/2024   CREATININE 0.61 01/19/2024   CALCIUM  9.5 01/19/2024   GFRNONAA >60 11/27/2020   GFRAA >60 09/19/2019       Assessment and Plan Chronic right knee pji = has finished long course of abtx, now monitoring off of abtx. No symptoms to suggest to have recurrence Rtc 6 months

## 2024-03-09 ENCOUNTER — Ambulatory Visit: Admitting: Physician Assistant

## 2024-03-12 ENCOUNTER — Ambulatory Visit: Admitting: Internal Medicine

## 2024-03-15 ENCOUNTER — Ambulatory Visit: Attending: Internal Medicine | Admitting: Internal Medicine

## 2024-03-15 VITALS — BP 120/70 | HR 45 | Ht 63.5 in | Wt 123.6 lb

## 2024-03-15 DIAGNOSIS — E782 Mixed hyperlipidemia: Secondary | ICD-10-CM | POA: Diagnosis not present

## 2024-03-15 DIAGNOSIS — I480 Paroxysmal atrial fibrillation: Secondary | ICD-10-CM | POA: Diagnosis not present

## 2024-03-15 DIAGNOSIS — I351 Nonrheumatic aortic (valve) insufficiency: Secondary | ICD-10-CM | POA: Diagnosis not present

## 2024-03-15 DIAGNOSIS — I700 Atherosclerosis of aorta: Secondary | ICD-10-CM | POA: Diagnosis not present

## 2024-03-15 DIAGNOSIS — I471 Supraventricular tachycardia, unspecified: Secondary | ICD-10-CM | POA: Diagnosis not present

## 2024-03-15 DIAGNOSIS — I491 Atrial premature depolarization: Secondary | ICD-10-CM | POA: Diagnosis not present

## 2024-03-15 DIAGNOSIS — I872 Venous insufficiency (chronic) (peripheral): Secondary | ICD-10-CM

## 2024-03-15 MED ORDER — ATORVASTATIN CALCIUM 10 MG PO TABS: 5.0000 mg | ORAL_TABLET | Freq: Every day | ORAL | 2 refills | Status: DC

## 2024-03-15 NOTE — Progress Notes (Signed)
 Cardiology Office Note:    Date:  03/15/2024   ID:  Crystal Russell, DOB May 12, 1950, MRN [id number]  PCP:  Domenica Harlene LABOR, MD  [facility] Cardiologist: Stanly Leavens MD [facility] Electrophysiologist:  None   CC:HLD  History of Present Illness:    Crystal Russell is a 73 y.o. female with a hx of PAF in [place], Mild Atrial Functional MR, Mild to moderate AI without LV dilation, PFO, possible distant DVT possible history of rheumatic and HLD who presents for evaluation 05/01/20.   2022: No AF on heart monitor. Only tolerate 5 mg of atorvastatin  without myalgias.  2023: HR 180 at ID visit (no EKG) 2024: Started PSCK9i.  Did not tolerate it  Discussed the use of AI scribe software for clinical note transcription with the patient, who gave verbal consent to proceed.  Crystal Russell  with asymptomatic SVT, mitral and aortic regurgitation, and hyperlipidemia who presents for cardiovascular follow-up.  She has a history of paroxysmal atrial fibrillation [facility] only while having thyroid  issues), supraventricular tachycardia, and premature atrial contractions. Her heart rate occasionally spikes but does not race, and she does not feel it (nomograms reveiwed).  She has been told she has mild mitral regurgitation and moderate aortic regurgitation. An echocardiogram was last performed in 2023. She has had a heart murmur since age 52.  She has a history of hyperlipidemia and has experienced statin myopathies, making her unable to tolerate statin therapy. She has also had issues with PCSK9 inhibitors. She is currently managing her cholesterol with a regimen that includes taking 10 mg of medication daily as tolerated, with some days at a higher dose. She experiences occasional cramps but finds them tolerable. She is concerned about the impact of her cholesterol on her carotid arteries and optic nerve oxygenation.  She has a patent foramen ovale and has been told she has mild atherosclerotic changes in  her carotid arteries. She has experienced weight loss over the past six months, losing about 10 pounds intentionally. Her recent weights have been around 123 to 125 pounds.  Past Medical History:  Diagnosis Date   Abdominal wall hernia 08/04/2012   Atrial fibrillation (HCC) 2008   REPORTS SHE WAS AT [place], UPON WAKING I FELT MY HEART GOING BOOM BOOM BOOM BEATING OUT OF MY CHEST. I WENT TO HOSPITAL AND THEY ISED MEDICINE TO CONVERT ME , IT TOOK OVER 10 HOURS TO CONVERT ME. BUT AFTER THAT IVE NEVER HAD ANY ISSUES SINCE. I USED TO HAVE A CARDIOLOGUIST AS MY PRIMARY IN [place]  BUT IHAVENT BEEN TO A CARDIOLOGIST SINCE I MOVED DOWN HERE.    Chicken pox as a child   Depression    Depression with anxiety 12/27/2012   Diverticulitis    DJD (degenerative joint disease) 08/05/2012   DVT (deep venous thrombosis) (HCC) 2018   Fifth disease 1989   DX AFTER EXPOSURE FROM HER CHILDREN; DEVELOPED RHEUMATOID ARTHRITIS DURING COURSE OF ILLNESS. REPORTS:   I DONT HAVE IT ANYMORE SINCE THE DISEASE IS GONE    H/O gestational diabetes mellitus, not currently pregnant 08/05/2012   History of    Heart murmur    echo 2012; I HAVE A FUNCTIONAL MURMUR    Hyperlipidemia    Hypothyroidism    Knee effusion, right    while on Eliquis, discontinued Eliquis after DVT resolved   Macular degeneration    Measles as a child   Migraines    Other and unspecified hyperlipidemia 08/05/2012   Paroxysmal A-fib (HCC)  Shingles 43   Spondylisthesis 2012   L5/S1 grade 3   Varicose veins 08/05/2012   Extending up to right groin B/l LE   Ventral hernia 08/05/2012   Wears glasses     Past Surgical History:  Procedure Laterality Date   BREAST DUCTAL SYSTEM EXCISION Right 05/05/2014   Procedure: RIGHT BREAST DUCT EXCISION;  Surgeon: Donnice Bury, MD;  Location: [facility];  Service: General;  Laterality: Right;   COLONOSCOPY     DILATION AND CURETTAGE OF UTERUS     x4   EXCISIONAL TOTAL KNEE  ARTHROPLASTY WITH ANTIBIOTIC SPACERS Right 09/14/2020   Procedure: Irrigation and debriement with poly exchange versus resection of right total knee arthroplasty and placement of antibitiotic spacer;  Surgeon: Ernie Donnice, MD;  Location: [facility];  Service: Orthopedics;  Laterality: Right;   MOUTH SURGERY     TOTAL KNEE ARTHROPLASTY Right 07/01/2017   Procedure: RIGHT TOTAL KNEE ARTHROPLASTY;  Surgeon: Ernie Donnice, MD;  Location: [facility];  Service: Orthopedics;  Laterality: Right;  70 mins   VARICOSE VEIN SURGERY  1995   right leg    Current Medications: No outpatient medications have been marked as taking for the 03/15/24 encounter (Appointment) with Santo Stanly LABOR, MD.     Allergies:   Ciprofloxacin , Crestor [rosuvastatin], Praluent  [alirocumab ], Pravachol [pravastatin], Cefazolin , and Rifampin    Social History   Socioeconomic History   Marital status: Married    Spouse name: Not on file   Number of children: Not on file   Years of education: Not on file   Highest education level: Associate degree: occupational, scientist, product/process development, or vocational program  Occupational History   Not on file  Tobacco Use   Smoking status: Former    Types: Cigarettes   Smokeless tobacco: Never  Vaping Use   Vaping status: Never Used  Substance and Sexual Activity   Alcohol use: No   Drug use: No   Sexual activity: Yes  Other Topics Concern   Not on file  Social History Narrative   Not on file   Social Drivers of Health   Financial Resource Strain: Low Risk  (11/25/2023)   Overall Financial Resource Strain (CARDIA)    Difficulty of Paying Living Expenses: Not hard at all  Food Insecurity: No Food Insecurity (11/25/2023)   Hunger Vital Sign    Worried About Running Out of Food in the Last Year: Never true    Ran Out of Food in the Last Year: Never true  Transportation Needs: No Transportation Needs (11/25/2023)   PRAPARE - Administrator, Civil Service (Medical): No    Lack of  Transportation (Non-Medical): No  Physical Activity: Sufficiently Active (11/25/2023)   Exercise Vital Sign    Days of Exercise per Week: 5 days    Minutes of Exercise per Session: 40 min  Stress: No Stress Concern Present (11/25/2023)   [employer] of Occupational Health - Occupational Stress Questionnaire    Feeling of Stress: Not at all  Social Connections: Socially Integrated (11/25/2023)   Social Connection and Isolation Panel    Frequency of Communication with Friends and Family: More than three times a week    Frequency of Social Gatherings with Friends and Family: Once a week    Attends Religious Services: More than 4 times per year    Active Member of [employer] or Organizations: Yes    Attends Banker Meetings: 1 to 4 times per year    Marital Status: Married  SOCIAL: Son has UC; one son Danae (born on earth day) was very sick with COVID, one son with two kids.  Doesn't do well with percentages in discussion Dealing with the loss of her dog  Family History: The patient's family history includes Asthma in her son and son; Cancer in her father and mother; Dementia in her maternal grandmother; Heart attack in her maternal grandfather and paternal uncle; Liver disease in her mother; Pancreatic cancer in an other family member; Peripheral Artery Disease in her sister; Stomach cancer in an other family member; Ulcerative colitis in her son and son. There is no history of Colon cancer, Cystic fibrosis, or Esophageal cancer. History of coronary artery disease notable for grandfather.  ROS:   Please see the history of present illness.     EKGs/Labs/Other Studies Reviewed:    The following studies were reviewed today:     Cardiac Studies & Procedures   ______________________________________________________________________________________________     ECHOCARDIOGRAM  ECHOCARDIOGRAM COMPLETE 03/19/2022  Narrative ECHOCARDIOGRAM REPORT    Patient Name:    Crystal Russell  Date of Exam: 03/19/2022 Medical Rec #:  [id number]     Height:       64.5 in Accession #:    [id number]    Weight:       139.0 lb Date of Birth:  04-08-1951     BSA:          1.685 m Patient Age:    71 years      BP:           110/70 mmHg Patient Gender: F             HR:           53 bpm. Exam Location:  [address]  Procedure: 2D Echo, 3D Echo, Cardiac Doppler, Color Doppler and Strain Analysis  Indications:    I35.1 Aortic Insufficiency  History:        Patient has prior history of Echocardiogram examinations, most recent 03/14/2021. Aortic Valve Disease and Mitral Valve Disease, Arrythmias:Atrial Fibrillation, Signs/Symptoms:Murmur; Risk Factors:Dyslipidemia, Family History of Coronary Artery Disease and Former Smoker.  Sonographer:    Heather Hawks RDCS Referring Phys: HARLENE LABOR BLYTH  IMPRESSIONS   1. Left ventricular ejection fraction, by estimation, is 60 to 65%. The left ventricle has normal function. The left ventricle has no regional wall motion abnormalities. Left ventricular diastolic parameters were normal. 2. Right ventricular systolic function is normal. The right ventricular size is normal. There is normal pulmonary artery systolic pressure. 3. Left atrial size was severely dilated. 4. Right atrial size was mildly dilated. 5. The mitral valve is normal in structure. Mild mitral valve regurgitation. No evidence of mitral stenosis. 6. The aortic valve is tricuspid. Aortic valve regurgitation is trivial. No aortic stenosis is present. 7. The inferior vena cava is normal in size with greater than 50% respiratory variability, suggesting right atrial pressure of 3 mmHg.  FINDINGS Left Ventricle: Left ventricular ejection fraction, by estimation, is 60 to 65%. The left ventricle has normal function. The left ventricle has no regional wall motion abnormalities. The left ventricular internal cavity size was normal in size. There is no left ventricular  hypertrophy. Left ventricular diastolic parameters were normal. Normal left ventricular filling pressure.  Right Ventricle: The right ventricular size is normal. No increase in right ventricular wall thickness. Right ventricular systolic function is normal. There is normal pulmonary artery systolic pressure. The tricuspid regurgitant velocity is 2.38 m/s, and with an assumed right  atrial pressure of 3 mmHg, the estimated right ventricular systolic pressure is 25.7 mmHg.  Left Atrium: Left atrial size was severely dilated.  Right Atrium: Right atrial size was mildly dilated.  Pericardium: There is no evidence of pericardial effusion.  Mitral Valve: The mitral valve is normal in structure. Mild mitral valve regurgitation. No evidence of mitral valve stenosis.  Tricuspid Valve: The tricuspid valve is normal in structure. Tricuspid valve regurgitation is trivial. No evidence of tricuspid stenosis.  Aortic Valve: The aortic valve is tricuspid. Aortic valve regurgitation is trivial. Aortic regurgitation PHT measures 776 msec. No aortic stenosis is present.  Pulmonic Valve: The pulmonic valve was normal in structure. Pulmonic valve regurgitation is trivial. No evidence of pulmonic stenosis.  Aorta: The aortic root is normal in size and structure.  Venous: The inferior vena cava is normal in size with greater than 50% respiratory variability, suggesting right atrial pressure of 3 mmHg.  IAS/Shunts: No atrial level shunt detected by color flow Doppler.   LEFT VENTRICLE PLAX 2D LVIDd:         4.00 cm   Diastology LVIDs:         2.70 cm   LV e' medial:    8.38 cm/s LV PW:         0.80 cm   LV E/e' medial:  9.1 LV IVS:        0.90 cm   LV e' lateral:   11.30 cm/s LVOT diam:     1.90 cm   LV E/e' lateral: 6.7 LV SV:         92 LV SV Index:   55        2D Longitudinal Strain LVOT Area:     2.84 cm  2D Strain GLS (A2C):   -28.6 % 2D Strain GLS (A3C):   -24.1 % 2D Strain GLS (A4C):   -27.6  % 2D Strain GLS Avg:     -26.8 %  3D Volume EF: 3D EF:        73 % LV EDV:       154 ml LV ESV:       42 ml LV SV:        112 ml  RIGHT VENTRICLE RV Basal diam:  4.30 cm RV Mid diam:    3.50 cm RV S prime:     15.30 cm/s TAPSE (M-mode): 2.9 cm  LEFT ATRIUM             Index        RIGHT ATRIUM           Index LA diam:        3.80 cm 2.26 cm/m   RA Area:     18.40 cm LA Vol (A2C):   82.5 ml 48.96 ml/m  RA Volume:   52.10 ml  30.92 ml/m LA Vol (A4C):   77.0 ml 45.70 ml/m LA Biplane Vol: 82.8 ml 49.14 ml/m AORTIC VALVE LVOT Vmax:   141.00 cm/s LVOT Vmean:  86.800 cm/s LVOT VTI:    0.326 m AI PHT:      776 msec  AORTA Ao Root diam: 2.80 cm Ao Asc diam:  3.10 cm  MITRAL VALVE               TRICUSPID VALVE MV Area (PHT)  cm         TR Peak grad:   22.7 mmHg MV Decel Time: 202 msec    TR Vmax:  238.00 cm/s MV E velocity: 76.20 cm/s MV A velocity: 70.20 cm/s  SHUNTS MV E/A ratio:  1.09        Systemic VTI:  0.33 m Systemic Diam: 1.90 cm  Annabella Scarce MD Electronically signed by Annabella Scarce MD Signature Date/Time: 03/19/2022/3:31:18 PM    Final    MONITORS  LONG TERM MONITOR (3-14 DAYS) 02/06/2023  Narrative   Patient had a minimum heart rate of 41 bpm, maximum heart rate of 110 bpm, and average heart rate of 57 bpm.   Predominant underlying rhythm was sinus rhythm.   One six beat run of slow SVT.  No patient device trigger.   Isolated PACs were rare (<1.0%).   Isolated PVCs were rare (<1.0%).   Triggered and diary events associated with sinus rhythm.  No malignant arrhythmias.       ______________________________________________________________________________________________        Recent Labs: 01/19/2024: ALT 14; BUN 8; Creatinine, Ser 0.61; Hemoglobin 12.7; Platelets 243.0; Potassium 4.4; Sodium 130; TSH 2.70  Recent Lipid Panel    Component Value Date/Time   CHOL 169 11/25/2023 0742   CHOL 156 05/20/2022 0931   TRIG 46.0  11/25/2023 0742   HDL 67.10 11/25/2023 0742   HDL 69 05/20/2022 0931   CHOLHDL 3 11/25/2023 0742   VLDL 9.2 11/25/2023 0742   LDLCALC 93 11/25/2023 0742   LDLCALC 76 05/20/2022 0931     Physical Exam:    VS:  There were no vitals taken for this visit.    Wt Readings from Last 3 Encounters:  03/08/24 123 lb (55.8 kg)  01/19/24 124 lb 6 oz (56.4 kg)  11/26/23 130 lb (59 kg)   Gen: No distress Neck: No JVD Cardiac: No Rubs or Gallops, holodiastolic murmur, regular bradycardia, +2 radial pulses Respiratory: Clear to auscultation bilaterally, normal effort, normal  respiratory rate GI: Soft, nontender, non-distended  MS: No edema;  moves all extremities Integument: Skin feels warm Neuro:  At time of evaluation, alert and oriented to person/place/time/situation  Psych: Normal affect, patient feels well  ASSESSMENT/PLAN:    Aortic atherosclerosis with mixed hyperlipidemia and statin intolerance Aortic atherosclerosis with mixed hyperlipidemia and statin intolerance. She has experienced statin myopathies and intolerance to PCSK9 inhibitors. Currently on a modified statin regimen with tolerable cramps. Discussed the role of age and natural aging in plaque buildup. Emphasized the importance of reducing plaque buildup to prevent heart attack or stroke. Discussed potential use of inclisiran if interested in the future. - Continue current statin regimen with 10 mg daily as tolerated - Discussed potential use of inclisiran if interested in the future.  Nonrheumatic aortic and mitral valve regurgitation Mild mitral regurgitation and moderate aortic regurgitation noted in 2023. No immediate intervention required. - Ordered echocardiogram to assess valve function.  Paroxysmal supraventricular arrhythmias (including paroxysmal atrial fibrillation- once in [place]), PSVT, and PACs) Paroxysmal supraventricular tachycardia and premature atrial contractions noted in the past. No current symptoms of  atrial fibrillation. Discussed the importance of monitoring for recurrence and potential need for anticoagulation if atrial fibrillation returns. - Continue to monitor for recurrence of atrial fibrillation and return anticoagulation if it returns.  Sinus bradycardia Heart rate appropriately increases with activity, indicating no need for pacemaker. No current symptoms or concerns related to bradycardia. - Continue monitoring heart rate and symptoms.  Carotid artery calcification Mild carotid artery calcification noted, consistent with age-related changes. No evidence of carotid artery stenosis. Discussed the normalcy of mild calcification at her age and the importance of  managing risk   Longitudinal care: The evaluation and management services provided today reflect the complexity inherent in caring for this patient, including the ongoing longitudinal relationship and management of multiple chronic conditions and/or the need for care coordination. The visit required a comprehensive assessment and management plan tailored to the patient's unique needs Time was spent addressing not only the acute concerns but also the broader context of the patient's health, including preventive care, chronic disease management, and care coordination as appropriate.  Complex longitudinal is necessary for conditions including: Aortic atherosclerosis with SDM about lipid lowering therapies, with mild CAS   Stanly Leavens, MD FASE [facility] Cardiologist [facility]  [address] [place], [place] [address] [phone]  8:01 AM

## 2024-03-15 NOTE — Addendum Note (Signed)
 Addended by: RANDY HAMP SAILOR on: 03/15/2024 08:48 AM   Modules accepted: Orders

## 2024-03-15 NOTE — Patient Instructions (Signed)
 Medication Instructions:  Your physician recommends that you continue on your current medications as directed. Please refer to the Current Medication list given to you today.  *If you need a refill on your cardiac medications before your next appointment, please call your pharmacy*  Lab Work: NONE  If you have labs (blood work) drawn today and your tests are completely normal, you will receive your results only by: MyChart Message (if you have MyChart) OR A paper copy in the mail If you have any lab test that is abnormal or we need to change your treatment, we will call you to review the results.  Testing/Procedures: Your physician has requested that you have an echocardiogram. Echocardiography is a painless test that uses sound waves to create images of your heart. It provides your doctor with information about the size and shape of your heart and how well your heart's chambers and valves are working. This procedure takes approximately one hour. There are no restrictions for this procedure. Please do NOT wear cologne, perfume, aftershave, or lotions (deodorant is allowed). Please arrive 15 minutes prior to your appointment time.  Please note: We ask at that you not bring children with you during ultrasound (echo/ vascular) testing. Due to room size and safety concerns, children are not allowed in the ultrasound rooms during exams. Our front office staff cannot provide observation of children in our lobby area while testing is being conducted. An adult accompanying a patient to their appointment will only be allowed in the ultrasound room at the discretion of the ultrasound technician under special circumstances. We apologize for any inconvenience.   Follow-Up: At [facility], you and your health needs are our priority.  As part of our continuing mission to provide you with exceptional heart care, our providers are all part of one team.  This team includes your primary Cardiologist  (physician) and Advanced Practice Providers or APPs (Physician Assistants and Nurse Practitioners) who all work together to provide you with the care you need, when you need it.  Your next appointment:   1 year(s)  Provider:   Stanly DELENA Leavens, MD

## 2024-03-26 LAB — OPHTHALMOLOGY REPORT-SCANNED

## 2024-04-19 ENCOUNTER — Encounter: Payer: Self-pay | Admitting: Internal Medicine

## 2024-04-20 MED ORDER — ATORVASTATIN CALCIUM 10 MG PO TABS: 10.0000 mg | ORAL_TABLET | Freq: Every day | ORAL | 3 refills | Status: AC

## 2024-04-28 ENCOUNTER — Ambulatory Visit (HOSPITAL_COMMUNITY)
Admission: RE | Admit: 2024-04-28 | Discharge: 2024-04-28 | Disposition: A | Discharge status: Home / Self Care | Source: Ambulatory Visit | Attending: Cardiology | Admitting: Cardiology

## 2024-04-28 DIAGNOSIS — I351 Nonrheumatic aortic (valve) insufficiency: Secondary | ICD-10-CM | POA: Diagnosis not present

## 2024-04-28 LAB — ECHOCARDIOGRAM COMPLETE
Area-P 1/2: 3.43 cm2
P 1/2 time: 1522 ms
S' Lateral: 2.7 cm

## 2024-05-01 ENCOUNTER — Ambulatory Visit: Payer: Self-pay | Admitting: Internal Medicine

## 2024-05-01 DIAGNOSIS — I351 Nonrheumatic aortic (valve) insufficiency: Secondary | ICD-10-CM

## 2024-05-01 DIAGNOSIS — I340 Nonrheumatic mitral (valve) insufficiency: Secondary | ICD-10-CM

## 2024-05-13 ENCOUNTER — Ambulatory Visit: Payer: Self-pay

## 2024-05-13 NOTE — Telephone Encounter (Signed)
 Appt scheduled

## 2024-05-13 NOTE — Telephone Encounter (Signed)
 FYI Only or Action Required?: FYI only for provider: appointment scheduled on 2/6.  Patient was last seen in primary care on 12/04/2023 by Domenica Harlene LABOR, MD.  Called Nurse Triage reporting Nose Problem.  Symptoms began several weeks ago.  Interventions attempted: Rest, hydration, or home remedies.  Symptoms are: gradually worsening.  Triage Disposition: See PCP When Office is Open (Within 3 Days)  Patient/caregiver understands and will follow disposition?: Yes, will follow disposition  Reason for Triage: Patient c/o the inside of her nose being very red. The left side is almost closed, right ear is clogged up. Does not feel sick but does have a headache. Reported Nasal dryness, crust and pain inside of nose. Has an appt 02/23 to see provider but does not think she can wait  that long.    Reason for Disposition  [1] After 5 days AND [2] shape of the nose has not returned to normal  Answer Assessment - Initial Assessment Questions 1. MECHANISM: How did the injury happen?      Denies injury states inside of nose is raw and bloody 3. LOCATION: What part of the nose is injured?      Inside nostrils 7. PAIN: Is it painful? If Yes, ask: How bad is the pain? (Scale 0-10; or none, mild, moderate, severe)     denies 9. OTHER SYMPTOMS: Do you have any other symptoms? (e.g., headache, neck pain, loss of consciousness)     States she is having R ear fullness,   States she has hx of staph infections, in surgical wounds. Denies fevers.  Protocols used: Nose Injury-A-AH

## 2024-05-14 ENCOUNTER — Encounter: Payer: Self-pay | Admitting: Family Medicine

## 2024-05-14 ENCOUNTER — Ambulatory Visit: Admitting: Family Medicine

## 2024-05-14 VITALS — BP 120/72 | HR 57 | Temp 98.0°F | Resp 16 | Ht 63.0 in | Wt 125.0 lb

## 2024-05-14 DIAGNOSIS — J00 Acute nasopharyngitis [common cold]: Secondary | ICD-10-CM

## 2024-05-14 DIAGNOSIS — H6993 Unspecified Eustachian tube disorder, bilateral: Secondary | ICD-10-CM

## 2024-05-14 NOTE — Progress Notes (Signed)
 Chief Complaint  Patient presents with   Nostrils     Dry Nostrils     Subjective: Patient is a 74 y.o. female here for nasal congestion.  Going on for the past 3 weeks. Having some drainage and congestion on the L side. She has associated fullness in the ears. She reports a blood clot out of the R ear. No obvious trauma, fevers.   Past Medical History:  Diagnosis Date   Abdominal wall hernia 08/04/2012   Atrial fibrillation (HCC) 2008   REPORTS SHE WAS AT [place], UPON WAKING I FELT MY HEART GOING BOOM BOOM BOOM BEATING OUT OF MY CHEST. I WENT TO HOSPITAL AND THEY ISED MEDICINE TO CONVERT ME , IT TOOK OVER 10 HOURS TO CONVERT ME. BUT AFTER THAT IVE NEVER HAD ANY ISSUES SINCE. I USED TO HAVE A CARDIOLOGUIST AS MY PRIMARY IN [place]  BUT IHAVENT BEEN TO A CARDIOLOGIST SINCE I MOVED DOWN HERE.    Chicken pox as a child   Depression    Depression with anxiety 12/27/2012   Diverticulitis    DJD (degenerative joint disease) 08/05/2012   DVT (deep venous thrombosis) (HCC) 2018   Fifth disease 1989   DX AFTER EXPOSURE FROM HER CHILDREN; DEVELOPED RHEUMATOID ARTHRITIS DURING COURSE OF ILLNESS. REPORTS:   I DONT HAVE IT ANYMORE SINCE THE DISEASE IS GONE    H/O gestational diabetes mellitus, not currently pregnant 08/05/2012   History of    Heart murmur    echo 2012; I HAVE A FUNCTIONAL MURMUR    Hyperlipidemia    Hypothyroidism    Knee effusion, right    while on Eliquis, discontinued Eliquis after DVT resolved   Macular degeneration    Measles as a child   Migraines    Other and unspecified hyperlipidemia 08/05/2012   Paroxysmal A-fib (HCC)    Shingles 43   Spondylisthesis 2012   L5/S1 grade 3   Varicose veins 08/05/2012   Extending up to right groin B/l LE   Ventral hernia 08/05/2012   Wears glasses     Objective: BP 120/72 (BP Location: Left Arm, Patient Position: Sitting)   Pulse (!) 57   Temp 98 F (36.7 C) (Oral)   Resp 16   Ht 5' 3 (1.6 m)   Wt 125 lb (56.7 kg)    SpO2 97%   BMI 22.14 kg/m  General: Awake, appears stated age Nose: Nares are patent, more congested than the left with clear rhinorrhea bilaterally; no sinus TTP Mouth: MMM, no pharyngeal/tonsillar exudate or erythema Ears: Patent, no otorrhea, negative TMs bilaterally Heart: Reg rhythm, bradycardic, no LE edema Lungs: CTAB, no rales, wheezes or rhonchi. No accessory muscle use Psych: Age appropriate judgment and insight, normal affect and mood  Assessment and Plan: Dysfunction of both eustachian tubes  Acute rhinitis  Flonase /fluticasone  2 sprays twice daily for the next several weeks.  Consider air humidifier.  No signs of infection.  She will let me know if nothing changes. The patient voiced understanding and agreement to the plan.  Mabel Mt Christopher, DO 05/14/24  2:42 PM

## 2024-05-14 NOTE — Patient Instructions (Signed)
 Start using Flonase  2 sprays each day.  Consider an air humidifier.   Let us  know if you need anything.

## 2024-05-31 ENCOUNTER — Ambulatory Visit: Admitting: Family Medicine

## 2024-10-04 ENCOUNTER — Ambulatory Visit: Admitting: Internal Medicine
